# Patient Record
Sex: Female | Born: 1940 | Race: White | Hispanic: No | Smoking: Never smoker
Health system: Southern US, Community
[De-identification: ages and names within clinical notes are randomized; demographics above are authoritative.]

## PROBLEM LIST (undated history)

## (undated) DIAGNOSIS — C801 Malignant (primary) neoplasm, unspecified: Secondary | ICD-10-CM

## (undated) DIAGNOSIS — K219 Gastro-esophageal reflux disease without esophagitis: Secondary | ICD-10-CM

## (undated) DIAGNOSIS — E785 Hyperlipidemia, unspecified: Secondary | ICD-10-CM

## (undated) DIAGNOSIS — E039 Hypothyroidism, unspecified: Secondary | ICD-10-CM

## (undated) DIAGNOSIS — K5790 Diverticulosis of intestine, part unspecified, without perforation or abscess without bleeding: Secondary | ICD-10-CM

## (undated) DIAGNOSIS — IMO0002 Reserved for concepts with insufficient information to code with codable children: Secondary | ICD-10-CM

## (undated) DIAGNOSIS — I454 Nonspecific intraventricular block: Secondary | ICD-10-CM

## (undated) DIAGNOSIS — I10 Essential (primary) hypertension: Secondary | ICD-10-CM

## (undated) DIAGNOSIS — E079 Disorder of thyroid, unspecified: Secondary | ICD-10-CM

## (undated) DIAGNOSIS — M1990 Unspecified osteoarthritis, unspecified site: Secondary | ICD-10-CM

## (undated) HISTORY — DX: Nonspecific intraventricular block: I45.4

## (undated) HISTORY — DX: Unspecified osteoarthritis, unspecified site: M19.90

## (undated) HISTORY — DX: Disorder of thyroid, unspecified: E07.9

## (undated) HISTORY — DX: Diverticulosis of intestine, part unspecified, without perforation or abscess without bleeding: K57.90

## (undated) HISTORY — DX: Essential (primary) hypertension: I10

## (undated) HISTORY — DX: Gastro-esophageal reflux disease without esophagitis: K21.9

## (undated) HISTORY — DX: Reserved for concepts with insufficient information to code with codable children: IMO0002

## (undated) HISTORY — DX: Hyperlipidemia, unspecified: E78.5

---

## 1973-07-07 HISTORY — PX: ABDOMINAL HYSTERECTOMY: SHX81

## 1974-07-07 HISTORY — PX: OTHER SURGICAL HISTORY: SHX169

## 1996-07-07 HISTORY — PX: BREAST SURGERY: SHX581

## 1996-07-07 HISTORY — PX: MASTECTOMY: SHX3

## 1997-10-11 ENCOUNTER — Encounter: Admission: RE | Admit: 1997-10-11 | Discharge: 1998-01-09 | Payer: Self-pay | Admitting: *Deleted

## 1998-02-02 ENCOUNTER — Ambulatory Visit (HOSPITAL_COMMUNITY): Admission: RE | Admit: 1998-02-02 | Discharge: 1998-02-02 | Payer: Self-pay | Admitting: Hematology and Oncology

## 1998-04-27 ENCOUNTER — Ambulatory Visit (HOSPITAL_COMMUNITY): Admission: RE | Admit: 1998-04-27 | Discharge: 1998-04-27 | Payer: Self-pay | Admitting: General Surgery

## 1999-11-04 ENCOUNTER — Encounter: Payer: Self-pay | Admitting: Hematology and Oncology

## 1999-11-04 ENCOUNTER — Encounter: Admission: RE | Admit: 1999-11-04 | Discharge: 1999-11-04 | Payer: Self-pay | Admitting: Hematology and Oncology

## 2000-11-20 ENCOUNTER — Encounter: Payer: Self-pay | Admitting: Hematology and Oncology

## 2000-11-20 ENCOUNTER — Encounter: Admission: RE | Admit: 2000-11-20 | Discharge: 2000-11-20 | Payer: Self-pay | Admitting: Hematology and Oncology

## 2001-11-24 ENCOUNTER — Encounter: Payer: Self-pay | Admitting: Hematology and Oncology

## 2001-11-24 ENCOUNTER — Encounter: Admission: RE | Admit: 2001-11-24 | Discharge: 2001-11-24 | Payer: Self-pay | Admitting: Hematology and Oncology

## 2002-09-30 ENCOUNTER — Ambulatory Visit (HOSPITAL_COMMUNITY): Admission: RE | Admit: 2002-09-30 | Discharge: 2002-09-30 | Payer: Self-pay | Admitting: Gastroenterology

## 2002-12-16 ENCOUNTER — Ambulatory Visit (HOSPITAL_COMMUNITY): Admission: RE | Admit: 2002-12-16 | Discharge: 2002-12-16 | Payer: Self-pay | Admitting: Hematology and Oncology

## 2002-12-16 ENCOUNTER — Encounter: Payer: Self-pay | Admitting: Hematology and Oncology

## 2003-12-18 ENCOUNTER — Ambulatory Visit (HOSPITAL_COMMUNITY): Admission: RE | Admit: 2003-12-18 | Discharge: 2003-12-18 | Payer: Self-pay | Admitting: Oncology

## 2004-08-28 ENCOUNTER — Emergency Department (HOSPITAL_COMMUNITY): Admission: EM | Admit: 2004-08-28 | Discharge: 2004-08-29 | Payer: Self-pay | Admitting: Emergency Medicine

## 2005-01-03 ENCOUNTER — Ambulatory Visit (HOSPITAL_COMMUNITY): Admission: RE | Admit: 2005-01-03 | Discharge: 2005-01-03 | Payer: Self-pay | Admitting: Internal Medicine

## 2005-01-09 ENCOUNTER — Ambulatory Visit: Payer: Self-pay | Admitting: Oncology

## 2006-01-09 ENCOUNTER — Ambulatory Visit (HOSPITAL_COMMUNITY): Admission: RE | Admit: 2006-01-09 | Discharge: 2006-01-09 | Payer: Self-pay | Admitting: Oncology

## 2006-01-15 ENCOUNTER — Ambulatory Visit: Payer: Self-pay | Admitting: Oncology

## 2006-01-19 LAB — CBC WITH DIFFERENTIAL/PLATELET
BASO%: 0.2 % (ref 0.0–2.0)
Eosinophils Absolute: 0.1 10*3/uL (ref 0.0–0.5)
HCT: 37 % (ref 34.8–46.6)
HGB: 12.6 g/dL (ref 11.6–15.9)
MCHC: 34.1 g/dL (ref 32.0–36.0)
MONO#: 0.6 10*3/uL (ref 0.1–0.9)
NEUT#: 6.5 10*3/uL (ref 1.5–6.5)
NEUT%: 72.2 % (ref 39.6–76.8)
WBC: 9 10*3/uL (ref 3.9–10.0)
lymph#: 1.8 10*3/uL (ref 0.9–3.3)

## 2006-01-19 LAB — COMPREHENSIVE METABOLIC PANEL
AST: 17 U/L (ref 0–37)
Albumin: 4 g/dL (ref 3.5–5.2)
BUN: 14 mg/dL (ref 6–23)
Calcium: 9.6 mg/dL (ref 8.4–10.5)
Chloride: 108 mEq/L (ref 96–112)
Potassium: 3.6 mEq/L (ref 3.5–5.3)

## 2006-07-07 HISTORY — PX: JOINT REPLACEMENT: SHX530

## 2006-08-31 ENCOUNTER — Inpatient Hospital Stay (HOSPITAL_COMMUNITY): Admission: RE | Admit: 2006-08-31 | Discharge: 2006-09-03 | Payer: Self-pay | Admitting: Orthopedic Surgery

## 2007-01-14 ENCOUNTER — Ambulatory Visit (HOSPITAL_COMMUNITY): Admission: RE | Admit: 2007-01-14 | Discharge: 2007-01-14 | Payer: Self-pay | Admitting: Oncology

## 2008-01-31 ENCOUNTER — Ambulatory Visit (HOSPITAL_COMMUNITY): Admission: RE | Admit: 2008-01-31 | Discharge: 2008-01-31 | Payer: Self-pay | Admitting: Internal Medicine

## 2008-02-05 DIAGNOSIS — I454 Nonspecific intraventricular block: Secondary | ICD-10-CM

## 2008-02-05 HISTORY — DX: Nonspecific intraventricular block: I45.4

## 2009-02-28 ENCOUNTER — Ambulatory Visit (HOSPITAL_COMMUNITY): Admission: RE | Admit: 2009-02-28 | Discharge: 2009-02-28 | Payer: Self-pay | Admitting: Internal Medicine

## 2010-03-22 ENCOUNTER — Ambulatory Visit (HOSPITAL_COMMUNITY): Admission: RE | Admit: 2010-03-22 | Discharge: 2010-03-22 | Payer: Self-pay | Admitting: Neurology

## 2010-04-23 ENCOUNTER — Encounter: Admission: RE | Admit: 2010-04-23 | Discharge: 2010-04-23 | Payer: Self-pay | Admitting: Gastroenterology

## 2010-06-24 ENCOUNTER — Ambulatory Visit (HOSPITAL_COMMUNITY)
Admission: RE | Admit: 2010-06-24 | Discharge: 2010-06-24 | Payer: Self-pay | Source: Home / Self Care | Attending: Gastroenterology | Admitting: Gastroenterology

## 2010-07-27 ENCOUNTER — Encounter: Payer: Self-pay | Admitting: Gastroenterology

## 2010-09-16 LAB — GLUCOSE, CAPILLARY: Glucose-Capillary: 113 mg/dL — ABNORMAL HIGH (ref 70–99)

## 2010-09-29 ENCOUNTER — Emergency Department (HOSPITAL_COMMUNITY): Payer: Medicare PPO

## 2010-09-29 ENCOUNTER — Emergency Department (HOSPITAL_COMMUNITY)
Admission: EM | Admit: 2010-09-29 | Discharge: 2010-09-29 | Disposition: A | Discharge status: Home / Self Care | Payer: Medicare PPO | Attending: Emergency Medicine | Admitting: Emergency Medicine

## 2010-09-29 DIAGNOSIS — W108XXA Fall (on) (from) other stairs and steps, initial encounter: Secondary | ICD-10-CM | POA: Insufficient documentation

## 2010-09-29 DIAGNOSIS — Y92009 Unspecified place in unspecified non-institutional (private) residence as the place of occurrence of the external cause: Secondary | ICD-10-CM | POA: Insufficient documentation

## 2010-09-29 DIAGNOSIS — I10 Essential (primary) hypertension: Secondary | ICD-10-CM | POA: Insufficient documentation

## 2010-09-29 DIAGNOSIS — S42209A Unspecified fracture of upper end of unspecified humerus, initial encounter for closed fracture: Secondary | ICD-10-CM | POA: Insufficient documentation

## 2010-10-04 ENCOUNTER — Ambulatory Visit
Admission: RE | Admit: 2010-10-04 | Discharge: 2010-10-04 | Disposition: A | Discharge status: Home / Self Care | Payer: Medicare PPO | Source: Ambulatory Visit | Attending: Orthopedic Surgery | Admitting: Orthopedic Surgery

## 2010-10-04 ENCOUNTER — Other Ambulatory Visit: Payer: Self-pay | Admitting: Orthopedic Surgery

## 2010-10-04 DIAGNOSIS — S42309A Unspecified fracture of shaft of humerus, unspecified arm, initial encounter for closed fracture: Secondary | ICD-10-CM

## 2010-10-07 ENCOUNTER — Other Ambulatory Visit: Payer: Self-pay | Admitting: Orthopedic Surgery

## 2010-10-07 DIAGNOSIS — C50919 Malignant neoplasm of unspecified site of unspecified female breast: Secondary | ICD-10-CM

## 2010-10-09 ENCOUNTER — Other Ambulatory Visit: Payer: Medicare PPO

## 2010-11-22 NOTE — H&P (Signed)
NAME:  Erin [name], Erin [name] NO.:  [id number]   MEDICAL RECORD NO.:  [id number]         PATIENT TYPE:  LINP   LOCATION:                               FACILITY:  [facility]   PHYSICIAN:  Ollen Gross, M.D.    DATE OF BIRTH:  Nov 24, 1940   DATE OF ADMISSION:  08/31/2006  DATE OF DISCHARGE:                              HISTORY & PHYSICAL   DATE OF OFFICE VISIT/HISTORY AND PHYSICAL:  August 14, 2006   CHIEF COMPLAINT:  Right knee pain.   HISTORY OF PRESENT ILLNESS:  The patient is a 70 year old female who has  been seen by Dr. Lequita Halt for ongoing right knee pain.  She has  significant pain and locking with her knee that has dated back some 43  years ago, when she sustained a tibial plateau fracture, which was  treated with fixation and then subsequent removal of hardware.  Unfortunately, she has gone on to develop severe arthritis with the  right knee.  She has been treated conservatively in the past, also  including injections.  Despite conservative measures,  she still has  pain.  It is felt she has reached the point where she could benefit  undergoing surgical intervention.  Risks and benefits have been  discussed and the patient has elected to proceed with surgery.   ALLERGIES:  No known drug allergies.   INTOLERANCES:  CODEINE causes sickness.   CURRENT MEDICATIONS:  1. Hydrochlorothiazide 25 mg daily.  2. Azor 5/40 mg daily.  3. Prevacid 30 mg daily.  4. Aspirin daily.  5. Multivitamin daily.   PAST MEDICAL HISTORY:  1. Hypertension.  2. Reflux disease.  3. History of ulcers in the past, dating back to 2.  4. Postmenopausal.  5. History of breast cancer.   PAST SURGICAL HISTORY:  1. Stomach surgery secondary to ulceration.  2. Tibial plateau surgical fixation with subsequent removal of      hardware.  3. Partial hysterectomy.  4. Right mastectomy secondary to breast cancer.   FAMILY HISTORY:  Mother deceased, age 3, with history of brain tumor.  Father deceased, age 89, secondary to MVA.  Maternal grandmother  deceased at age 69.   SOCIAL HISTORY:  Married, works in a [employer] as a Engineer, manufacturing.  Nonsmoker, no alcohol.  Four children.  Husband and daughter will be  assisting with care after surgery.   REVIEW OF SYSTEMS:  GENERAL:  No fevers, chills, night sweats.  NEUROLOGIC:  No seizures, syncope, paralysis.  RESPIRATORY:  No  shortness of breath, productive cough, or hemoptysis.  CARDIOVASCULAR:  No chest pain, angina, or orthopnea.  GASTROINTESTINAL:  No nausea,  vomiting, diarrhea, or constipation.  GENITOURINARY:  No dysuria,  hematuria, or discharge.  MUSCULOSKELETAL:  Right knee.   PHYSICAL EXAMINATION:  VITAL SIGNS:  Pulse 88, respirations 14, blood  pressure 148/76.  GENERAL:  A 70 year old white female, well-nourished, well-developed, no  acute distress.  She is a very good historian.  She is alert, oriented,  cooperative, and pleasant.  HEENT:  Normocephalic, atraumatic.  Pupils round and reactive.  Oropharynx clear.  EOMs intact.  She  has upper full denture plate.  NECK:  Supple.  CHEST:  Clear.  No rhonchi, rales, or wheezing.  She has had a right  mastectomy (no blood pressure or IV sticks in the right arm).  HEART:  Regular rate and rhythm without murmur.  S1, S2 noted.  No rubs,  thrills, palpitations.  ABDOMEN:  Soft, nontender.  Bowel sounds present.  RECTAL/BREASTS/GENITALIA:  Not done, not pertinent to present illness.  EXTREMITIES:  Right knee, 20 degree valgus malalignment deformity.  Range of motion of 5-125.  No instability.   IMPRESSION:  1. Osteoarthritis, right knee.  2. Hypertension.  3. Reflux disease.  4. Past history of ulcers, 1975.  5. Postmenopausal.  6. History of breast cancer.   PLAN:  The patient admitted to [facility] to undergo a right  total knee arthroplasty.  Surgery will be performed by Dr. Ollen Gross.  Her medical physician, Dr. Felipa Eth, will be notified of  the room  number and admission and will be consulted if needed for medical  assistance with the patient throughout the hospital course.      Alexzandrew L. Julien Girt, P.A.      Ollen Gross, M.D.  Electronically Signed   ALP/MEDQ  D:  08/30/2006  T:  08/30/2006  Job:  [id number]   cc:   Larina Earthly, M.D.  Fax: [phone]   Ollen Gross, M.D.  Fax: [phone]

## 2010-11-22 NOTE — Op Note (Signed)
NAME:  Erin [name], Erin [name]                        ACCOUNT NO.:  [id number]   MEDICAL RECORD NO.:  [id number]                   PATIENT TYPE:  AMB   LOCATION:  ENDO                                 FACILITY:  [facility]   PHYSICIAN:  Petra Kuba, M.D.                 DATE OF BIRTH:  1940-08-06   DATE OF PROCEDURE:  09/30/2002  DATE OF DISCHARGE:                                 OPERATIVE REPORT   PROCEDURE:  Colonoscopy.   INDICATIONS FOR PROCEDURE:  A patient with multiple bouts of presumed  diverticulitis due for colonic screening.   Consent was signed after risks, benefits, methods, and options were  thoroughly discussed in the office.   MEDICINES USED:  Demerol 80, Versed 7.   DESCRIPTION OF PROCEDURE:  Rectal inspection was pertinent for external  hemorrhoids, small. Digital exam was negative. The pediatric video  adjustable colonoscope was inserted and with some difficulty due to a  tortuous edematous sigmoid once through that area I was able to easily  advance to the cecum. This did require some abdominal pressure but no  position changes. Other than left sided diverticula, no abnormalities were  seen. The cecum was identified by the appendiceal orifice and the ileocecal  valve. The prep on the right was pertinent for some stool adherent to the  wall which could not be washed off. The rest of the prep was adequate. Lots  of washing and suctioning was done for adequate visualization. On slow  withdrawal through the colon, the cecum and the ascending were normal. In  the distal transverse were scattered diverticula as was in the descending.  The sigmoid was rather tortuous and edematous, possibly some early  stricturing but no polypoid lesions or other abnormalities were seen. At one  point, we thought we saw a polyp but did push on it with the biopsy forceps  and it inverted and was a diverticula. No biopsies were obtained. Once back  in the rectum, the scope was retroflexed  pertinent for some internal  hemorrhoids. The scope was straightened and readvanced a very short ways up  the sigmoid, air was suctioned, scope removed. The patient tolerated the  procedure well. There was no obvious or immediate complications.   ENDOSCOPIC DIAGNOSIS:  1. Internal and external hemorrhoids.  2. Sigmoid edema, tortuosity and diverticula.  3. Scattered descending and distal transverse diverticula.  4. Otherwise within normal limits to the cecum.    PLAN:  Happy to see back p.r.n., otherwise, return care to Dr. Felipa Eth for the  customary health care maintenance to include yearly rectals and guaiacs and  repeat screening as needed in five years. Might consider a one time barium  enema or virtual colonoscopy if available at that juncture.  Petra Kuba, M.D.    MEM/MEDQ  D:  09/30/2002  T:  09/30/2002  Job:  [id number]   cc:   Larina Earthly, M.D.  [address]  [place]  [place] [address]  Fax: [phone]

## 2010-11-22 NOTE — Op Note (Signed)
NAME:  Erin [name], Erin [name] NO.:  [id number]   MEDICAL RECORD NO.:  [id number]          PATIENT TYPE:  INP   LOCATION:  1512                         FACILITY:  [facility]   PHYSICIAN:  Ollen Gross, M.D.    DATE OF BIRTH:  02/14/1941   DATE OF PROCEDURE:  08/31/2006  DATE OF DISCHARGE:                               OPERATIVE REPORT   PREOPERATIVE DIAGNOSIS:  Osteoarthritis, right knee.   POSTOPERATIVE DIAGNOSIS:  Osteoarthritis, right knee.   PROCEDURE:  Right total knee arthroplasty, computer navigated.   SURGEON:  Aluisio.   ASSISTANT:  Avel Peace, P.A.C.   ANESTHESIA:  Spinal.   ESTIMATED BLOOD LOSS:  Minimal.   DRAIN:  Hemovac x1.   TOURNIQUET TIME:  51 minutes at 300 mmHg.   COMPLICATIONS:  None.   CONDITION:  Stable to recovery.   BRIEF CLINICAL NOTE:  Ms. Erin [name] is a 70 year old female end-stage  osteoarthritis of the right knee, previous femur fracture treated with  intermedullary nailing.  She has deformity of the whole lower leg with  valgus at the knee.  She presents now for total knee arthroplasty and  under computer navigation given the prior femur fracture.   PROCEDURE IN DETAIL:  After successful administration of spinal  anesthetic a tourniquet is placed high on her right thigh and right  lower extremity prepped and draped in the usual sterile fashion.  Extremity is wrapped in Esmarch, knee flexed, tourniquet inflated to 300  mmHg.  A midline incision is made with a 10 blade through the  subcutaneous tissue to the level of the extensor mechanism.  A fresh  blade is used to make a medial parapatellar arthrotomy.  Minimal tissue  medially is subperiosteally elevated at the joint line with the knife,  then we also elevated laterally subperiosteally.  Patella subluxed  laterally, knee flexed 90 degrees, ACL and PCL removed.  The 4 mm Shanz  screws were then placed, 2 in the tibia and 2 in the femur, and the  computerized rays attached.   Anatomical data is entered into the  computer for generation of the femoral tibial models.  She had a 14  degree valgus deformity, 10 degree flexion contracture.   We then placed a distal femoral cutting block under computer guidance to  remove 10 mm of the distal femur in neutral varus valgus, neutral  flexion extension.  Resection is made with an oscillating saw.  A  verification device is placed on the cut bone surface to confirm that  the cut is made as planned.  The rotation is marked at the epicondylar  axis.  A size 3 is the most appropriate size for the femur.  This was  per the computer and also per the intraop measurement.  The AP cutting  block was then placed and anterior, posterior and Chamfer cuts made.  The rotation did correspond with the computer-generated rotation.   Tibia subluxed forward and menisci removed.  The tibial cutting guide  was placed under computer guidance and is placed for resection of about  6 mm off the more deficient lateral side with neutral  varus and valgus  and 2 degree posterior slope.  Resection is made with an oscillating saw  and the verification device confirms that the resection is made as  planned.  A 10 mm spacer block was then placed in flexion and extension,  shows excellent balance throughout.  In extension, she was a tiny bit  tight in valgus, so I did some soft tissue release to get her balanced  perfectly.  We then completed preparation of the tibial side with the  modular drill and keel punch and the femoral side with the intercondylar  cut.  Our tibia is a size 3 as is the femur.  A trial mobile bearing  tibial tray with the trial posterior stabilized femur and a 10 mm  posterior stabilized rotating platform inserter placed.  Our alignment  is 1 degree of valgus with no flexion contracture and at 90 degrees we  are balanced perfectly.  The patella was then everted, thickness  measured to be 21 mm.  Free hand resection is taken to 12  mm, a 35  template is placed, lug holes are drilled, a trial patella is placed and  it tracks normally.  Osteophytes are removed at the posterior femur with  the trial placed.  All trials are removed and the cut bone surfaces  repaired with pulsatile lavage.  Cement is mixed and once ready for  implantation the size 3 mobile bearing tibial tray, size 3 posterior  stabilized femur and 35 patella are cemented into place and the patella  is held with the clamp.  A trial 10 mm insert is placed, knee held in  full extension and all extruded cement removed.  Once the cement was  fully hardened, then the permanent 10 mm posterior stabilized rotating  platform insert is placed into the tibial tray.  The wound was copiously  irrigated with saline solution and the extensor mechanism closed over a  Hemovac drain with interrupted #1 PDS.  Flexion against gravity is about  125 degrees.  Tourniquet is released, total time of 51 minutes.  Please  note that the computer pins had been removed prior to cementing the  components in.  Subcu was then closed with interrupted #2-0 Vicryl,  subcuticular running #4-0 Monocryl.  Drain is hooked to suction.  Incision cleaned and dry and Steri-Strips and a bulky sterile dressing  applied.  She was then awakened and transported to Recovery in stable  condition.      Ollen Gross, M.D.  Electronically Signed     FA/MEDQ  D:  08/31/2006  T:  08/31/2006  Job:  [id number]

## 2010-11-22 NOTE — Discharge Summary (Signed)
Erin [name], NEU NO.:  [id number]   MEDICAL RECORD NO.:  [id number]          PATIENT TYPE:  INP   LOCATION:  1512                         FACILITY:  [facility]   PHYSICIAN:  Erin [name], M.D.    DATE OF BIRTH:  [birth year]   DATE OF ADMISSION:  08/31/2006  DATE OF DISCHARGE:  09/03/2006                               DISCHARGE SUMMARY   DATE OF OFFICE VISIT HISTORY AND PHYSICAL:  August 31, 2006.   ADMITTING DIAGNOSES:  1. Osteoarthritis right knee  2. Hypertension.  3. Reflux disease.  4. Past history of ulcers in 1975.  5. Postmenopausal.  6. History of breast cancer.   DISCHARGE DIAGNOSES:  1. Osteoarthritis right knee, status post right total knee      arthroplasty, computer-navigation assisted.  2. Acute blood loss anemia; did not require transfusion.  3. Hypertension.  4. Reflux disease.  5. Past history of ulcers in 1975.  6. Postmenopausal.  7. History of breast cancer.   PROCEDURE:  August 31, 2006, right total knee computer-assisted.  Surgeon:  Erin [name].  Assistant:  Erin Peace, PA-C.  Spinal  anesthesia.   CONSULTS:  None.   BRIEF HISTORY:  Erin [name] is a 70 year old female with end-stage  arthritis of the right knee and previous femur fracture treated with  intramedullary nailing.  She has a deformity of the whole lower leg with  valgus at the knee, now presents for total knee arthroplasty assisted by  computer navigation for the femur fracture.   LABORATORY DATA:  Preoperative CBC showed a hemoglobin of 13.1,  hematocrit of 38.1, white cell count of 6.3.  Postoperative hemoglobin  9.2, drifted down to 8.9.  Last noted H&H was 8.7 and 25.1.  PT/PTT on  admission 12.8 and 29 respectively.  INR 1.0.  Serial pro times  followed.  Last noted PT/INR 28.5 and 2.5.  Chem panel on admission all  within normal limits.  Serial BMETs were followed.  Electrolytes  remained within normal limits.  Preoperative UA negative.  Blood  group/type AB positive.   Chest x-ray April 10, 2006:  No evidence of acute process or metastatic  disease.   EKG April 10, 2006:  Sinus rhythm, unconfirmed.   HOSPITAL COURSE:  The patient was admitted to [facility]  tolerated procedure well, later transferred to the recovery room and  then the orthopedic floor.  Started on PCA and p.o. analgesics for pain  control following surgery.  Did have nausea through the night with PCA  which was stopped.  She was unable to tolerate the Percocet so she was  weaned over to Darvocet.  Hemovac drain came out through the night.  She  was seen on rounds by Erin [name] on the morning of day #1.  Started  getting up out of bed slowly with physical therapy.  Initial day or so  was slow because of the postoperative nausea.  We resumed her Prevacid  because of the reflux.  By day #2 she was doing a little bit better.  The leg was quite sore so we discontinued the CPM and  the PAS hose, but  the nausea had improved.  She started getting up with physical therapy,  just doing a little bit of short ambulation in the room.  Dressing was  changed, incision looked good.  By the following day she was actually  doing much better.  She did real well physical therapy, getting up and  ambulating approximately 125 feet.  The nausea was complete resolved.  She was seen in rounds by Erin [name] and asking to go home.  She did  get two therapy sessions in and was discharged at that time.   DISCHARGE PLAN:  1. The patient was discharged home on September 03, 2006.  2. Discharge diagnoses:  Please see above.  3. Discharge medications:  Nu-Iron Coumadin, Darvocet, Robaxin.  4. Activity:  Weightbearing as tolerated right lower extremity.  Home      health PT, home health nursing.  Total knee protocol.  5. Followup 2 weeks.   DISPOSITION:  Home.   CONDITION UPON DISCHARGE:  Improving.      Erin [name], P.A.      Erin [name], M.D.   Electronically Signed    ALP/MEDQ  D:  09/30/2006  T:  09/30/2006  Job:  [id number]   cc:   Erin [name], M.D.  Fax: [phone]

## 2011-04-09 ENCOUNTER — Other Ambulatory Visit (HOSPITAL_COMMUNITY): Payer: Self-pay | Admitting: Internal Medicine

## 2011-04-09 DIAGNOSIS — Z1231 Encounter for screening mammogram for malignant neoplasm of breast: Secondary | ICD-10-CM

## 2011-04-11 ENCOUNTER — Ambulatory Visit (HOSPITAL_COMMUNITY)
Admission: RE | Admit: 2011-04-11 | Discharge: 2011-04-11 | Disposition: A | Discharge status: Home / Self Care | Payer: Medicare PPO | Source: Ambulatory Visit | Attending: Internal Medicine | Admitting: Internal Medicine

## 2011-04-11 ENCOUNTER — Other Ambulatory Visit (HOSPITAL_COMMUNITY): Payer: Self-pay | Admitting: Internal Medicine

## 2011-04-11 DIAGNOSIS — Z1231 Encounter for screening mammogram for malignant neoplasm of breast: Secondary | ICD-10-CM

## 2011-04-16 ENCOUNTER — Other Ambulatory Visit: Payer: Self-pay | Admitting: Internal Medicine

## 2011-04-16 DIAGNOSIS — R928 Other abnormal and inconclusive findings on diagnostic imaging of breast: Secondary | ICD-10-CM

## 2011-04-29 ENCOUNTER — Ambulatory Visit
Admission: RE | Admit: 2011-04-29 | Discharge: 2011-04-29 | Disposition: A | Discharge status: Home / Self Care | Payer: Medicare PPO | Source: Ambulatory Visit | Attending: Internal Medicine | Admitting: Internal Medicine

## 2011-04-29 DIAGNOSIS — R928 Other abnormal and inconclusive findings on diagnostic imaging of breast: Secondary | ICD-10-CM

## 2011-05-13 ENCOUNTER — Other Ambulatory Visit (HOSPITAL_COMMUNITY): Payer: Self-pay | Admitting: Obstetrics and Gynecology

## 2011-05-13 DIAGNOSIS — N898 Other specified noninflammatory disorders of vagina: Secondary | ICD-10-CM

## 2011-05-15 ENCOUNTER — Inpatient Hospital Stay (HOSPITAL_COMMUNITY): Admission: RE | Admit: 2011-05-15 | Payer: Medicare PPO | Source: Ambulatory Visit

## 2011-05-22 ENCOUNTER — Other Ambulatory Visit (HOSPITAL_COMMUNITY): Payer: Self-pay | Admitting: Obstetrics and Gynecology

## 2011-05-22 DIAGNOSIS — N949 Unspecified condition associated with female genital organs and menstrual cycle: Secondary | ICD-10-CM

## 2011-05-22 DIAGNOSIS — N898 Other specified noninflammatory disorders of vagina: Secondary | ICD-10-CM

## 2011-05-26 ENCOUNTER — Encounter (HOSPITAL_COMMUNITY): Payer: Self-pay

## 2011-05-26 ENCOUNTER — Ambulatory Visit (HOSPITAL_COMMUNITY)
Admission: RE | Admit: 2011-05-26 | Discharge: 2011-05-26 | Disposition: A | Discharge status: Home / Self Care | Payer: Medicare PPO | Source: Ambulatory Visit | Attending: Obstetrics and Gynecology | Admitting: Obstetrics and Gynecology

## 2011-05-26 DIAGNOSIS — N898 Other specified noninflammatory disorders of vagina: Secondary | ICD-10-CM | POA: Insufficient documentation

## 2011-05-26 DIAGNOSIS — N289 Disorder of kidney and ureter, unspecified: Secondary | ICD-10-CM | POA: Insufficient documentation

## 2011-05-26 DIAGNOSIS — N949 Unspecified condition associated with female genital organs and menstrual cycle: Secondary | ICD-10-CM

## 2011-05-26 DIAGNOSIS — Z853 Personal history of malignant neoplasm of breast: Secondary | ICD-10-CM | POA: Insufficient documentation

## 2011-05-26 DIAGNOSIS — K838 Other specified diseases of biliary tract: Secondary | ICD-10-CM | POA: Insufficient documentation

## 2011-05-26 DIAGNOSIS — Z90710 Acquired absence of both cervix and uterus: Secondary | ICD-10-CM | POA: Insufficient documentation

## 2011-05-26 HISTORY — DX: Malignant (primary) neoplasm, unspecified: C80.1

## 2011-05-26 MED ORDER — IOHEXOL 300 MG/ML  SOLN
100.0000 mL | Freq: Once | INTRAMUSCULAR | Status: AC | PRN
  Administered 2011-05-26: 100 mL via INTRAVENOUS

## 2011-07-16 ENCOUNTER — Encounter (INDEPENDENT_AMBULATORY_CARE_PROVIDER_SITE_OTHER): Payer: Self-pay

## 2011-07-21 ENCOUNTER — Ambulatory Visit (INDEPENDENT_AMBULATORY_CARE_PROVIDER_SITE_OTHER): Payer: Medicare PPO | Admitting: General Surgery

## 2011-07-21 ENCOUNTER — Encounter (INDEPENDENT_AMBULATORY_CARE_PROVIDER_SITE_OTHER): Payer: Self-pay | Admitting: General Surgery

## 2011-07-21 VITALS — BP 156/92 | HR 80 | Temp 97.8°F | Resp 16 | Ht 62.0 in | Wt 147.6 lb

## 2011-07-21 DIAGNOSIS — N824 Other female intestinal-genital tract fistulae: Secondary | ICD-10-CM

## 2011-07-21 NOTE — Progress Notes (Signed)
Patient ID: Erin [name], female   DOB: [birth year], 71 y.o.   MRN: [id number]  Chief Complaint  Patient presents with  . Other    est pt new prob- eval fistula     HPI Erin [name] is a 71 y.o. female.   HPI  She is referred by Dr. 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 On March 06, 2011 she reports the onset of brown foul-smelling vaginal discharge. She was placed on antibiotics by Dr. Felipa Eth but the discharge persisted. She states she also passes air out of her vagina at times. In October a colonoscopy was performed by Dr. Ewing Schlein, I do not have the results of this yet. In November a CT scan of the abdomen and pelvis was performed which demonstrated findings consistent with a colovaginal fistula. She continues to have the brownish foul-smelling drainage and has to change the pads to 3 times a day. She is here to discuss further treatment of this.  Past Medical History  Diagnosis Date  . Bundle branch block   . Degenerative joint disease   . Diverticulosis   . GERD (gastroesophageal reflux disease)   . Hyperlipidemia   . Hypertension   . Thyroid disease     hypothyroidism  . Osteoarthritis   . Osteoporosis   . Ulcer     peptic  . Cancer     breast ca    Past Surgical History  Procedure Date  . Gastric ulcer repair   . Breast surgery     mastectomy right breast  . Abdominal hysterectomy   . Fracture surgery 2007    knee replacement    History reviewed. No pertinent family history.  Social History History  Substance Use Topics  . Smoking status: Never Smoker   . Smokeless tobacco: Not on file  . Alcohol Use: No    Allergies  Allergen Reactions  . Codeine   . Morphine And Related     Current Outpatient Prescriptions  Medication Sig Dispense Refill  . ALPRAZolam (NIRAVAM) 0.25 MG dissolvable tablet Take 0.25 mg by mouth at bedtime as needed.      Marland Kitchen amLODipine (NORVASC) 5 MG tablet Take 5 mg by mouth daily.      . Calcium-Vitamin D (CALTRATE 600  PLUS-VIT D PO) Take by mouth.      . cyclobenzaprine (FLEXERIL) 10 MG tablet Take 10 mg by mouth 3 (three) times daily as needed.      . fish oil-omega-3 fatty acids 1000 MG capsule Take 2 g by mouth daily.      . hydrochlorothiazide (HYDRODIURIL) 25 MG tablet Take 25 mg by mouth daily.      Marland Kitchen levothyroxine (SYNTHROID, LEVOTHROID) 50 MCG tablet Take 50 mcg by mouth daily.      Marland Kitchen losartan (COZAAR) 50 MG tablet Take 50 mg by mouth daily.      . Magnesium 250 MG TABS Take by mouth.      . Multiple Vitamin (MULTIVITAMIN) tablet Take 1 tablet by mouth daily.      Marland Kitchen omeprazole (PRILOSEC) 40 MG capsule Take 40 mg by mouth daily.        Review of Systems Review of Systems  Constitutional: Negative.   HENT: Negative.   Respiratory: Negative.   Cardiovascular: Negative.   Gastrointestinal:       Reflux  Genitourinary: Positive for vaginal discharge. Negative for dysuria, hematuria and difficulty urinating.  Neurological: Negative.   Hematological: Negative.     Blood pressure 156/92, pulse  80, temperature 97.8 F (36.6 C), temperature source Temporal, resp. rate 16, height 5\' 2"  (1.575 m), weight 147 lb 9.6 oz (66.951 kg).  Physical Exam Physical Exam  Constitutional: She appears well-developed and well-nourished. No distress.  HENT:  Head: Normocephalic and atraumatic.  Eyes: Conjunctivae and EOM are normal.  Neck: Neck supple.  Cardiovascular: Normal rate and regular rhythm.   No murmur heard. Pulmonary/Chest: Effort normal and breath sounds normal.  Abdominal: Soft. She exhibits no distension and no mass. There is no tenderness.       Upper midline scar.  Genitourinary:       Yellowish vaginal discharge.  Musculoskeletal: Normal range of motion. She exhibits no edema.  Lymphadenopathy:    She has no cervical adenopathy.  Neurological: She is alert.  Skin: Skin is warm and dry.  Psychiatric: She has a normal mood and affect. Her behavior is normal.    Data Reviewed Dr.  Eligha Bridegroom note.  CT scan.  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 Clinically there does not appear to be bladder involvement although the bladder appears to be close to this process on CT scan.    Plan    Laparoscopic-assisted possible open partial colectomy with repair of colovaginal fistula and left ureteral stent placement by urology. I've explained the procedure risks and aftercare extensively with her and her daughter.    Risks include but are not limited to bleeding, infection, wound problems, anesthesia, anastomotic leak, need for colostomy, injury to intraabominal organs (such as intestine, spleen, kidney, bladder, ureter, etc.), ileus, irregular bowel habits.  She seems to understand and agrees to proceed.       Erin [name] J 07/21/2011, 5:11 PM

## 2011-07-23 ENCOUNTER — Other Ambulatory Visit: Payer: Self-pay | Admitting: Urology

## 2011-08-01 ENCOUNTER — Encounter (HOSPITAL_COMMUNITY): Payer: Self-pay

## 2011-08-08 ENCOUNTER — Other Ambulatory Visit (INDEPENDENT_AMBULATORY_CARE_PROVIDER_SITE_OTHER): Payer: Self-pay | Admitting: General Surgery

## 2011-08-11 ENCOUNTER — Encounter (HOSPITAL_COMMUNITY)
Admission: RE | Admit: 2011-08-11 | Discharge: 2011-08-11 | Disposition: A | Discharge status: Home / Self Care | Payer: Medicare HMO | Source: Ambulatory Visit | Attending: General Surgery | Admitting: General Surgery

## 2011-08-11 ENCOUNTER — Encounter (HOSPITAL_COMMUNITY): Payer: Self-pay

## 2011-08-11 ENCOUNTER — Ambulatory Visit (HOSPITAL_COMMUNITY)
Admission: RE | Admit: 2011-08-11 | Discharge: 2011-08-11 | Disposition: A | Discharge status: Home / Self Care | Payer: Medicare HMO | Source: Ambulatory Visit | Attending: General Surgery | Admitting: General Surgery

## 2011-08-11 ENCOUNTER — Telehealth (INDEPENDENT_AMBULATORY_CARE_PROVIDER_SITE_OTHER): Payer: Self-pay

## 2011-08-11 DIAGNOSIS — Z01818 Encounter for other preprocedural examination: Secondary | ICD-10-CM | POA: Insufficient documentation

## 2011-08-11 DIAGNOSIS — Z01812 Encounter for preprocedural laboratory examination: Secondary | ICD-10-CM | POA: Insufficient documentation

## 2011-08-11 DIAGNOSIS — I10 Essential (primary) hypertension: Secondary | ICD-10-CM | POA: Insufficient documentation

## 2011-08-11 DIAGNOSIS — Z9010 Acquired absence of unspecified breast and nipple: Secondary | ICD-10-CM | POA: Insufficient documentation

## 2011-08-11 DIAGNOSIS — Z853 Personal history of malignant neoplasm of breast: Secondary | ICD-10-CM | POA: Insufficient documentation

## 2011-08-11 HISTORY — DX: Hypothyroidism, unspecified: E03.9

## 2011-08-11 LAB — SURGICAL PCR SCREEN
MRSA, PCR: NEGATIVE
Staphylococcus aureus: POSITIVE — AB

## 2011-08-11 LAB — COMPREHENSIVE METABOLIC PANEL
Alkaline Phosphatase: 100 U/L (ref 39–117)
BUN: 22 mg/dL (ref 6–23)
Chloride: 105 mEq/L (ref 96–112)
Creatinine, Ser: 0.99 mg/dL (ref 0.50–1.10)
GFR calc Af Amer: 65 mL/min — ABNORMAL LOW (ref >90)
Glucose, Bld: 110 mg/dL — ABNORMAL HIGH (ref 70–99)
Potassium: 3.9 mEq/L (ref 3.5–5.1)
Total Bilirubin: 0.2 mg/dL — ABNORMAL LOW (ref 0.3–1.2)
Total Protein: 7.2 g/dL (ref 6.0–8.3)

## 2011-08-11 LAB — DIFFERENTIAL
Basophils Absolute: 0 10*3/uL (ref 0.0–0.1)
Basophils Relative: 0 % (ref 0–1)
Eosinophils Absolute: 0.1 10*3/uL (ref 0.0–0.7)
Neutro Abs: 9.6 10*3/uL — ABNORMAL HIGH (ref 1.7–7.7)
Neutrophils Relative %: 79 % — ABNORMAL HIGH (ref 43–77)

## 2011-08-11 LAB — CBC
HCT: 39.8 % (ref 36.0–46.0)
Hemoglobin: 12.9 g/dL (ref 12.0–15.0)
MCHC: 32.4 g/dL (ref 30.0–36.0)
MCV: 94.5 fL (ref 78.0–100.0)

## 2011-08-11 LAB — PROTIME-INR
INR: 0.94 (ref 0.00–1.49)
Prothrombin Time: 12.8 seconds (ref 11.6–15.2)

## 2011-08-11 NOTE — Telephone Encounter (Signed)
[facility] pre op calling to report abnormal labs on the patient.  Platelet count of 118,000. Pt is scheduled for surgery on 08/15/11.

## 2011-08-11 NOTE — Pre-Procedure Instructions (Signed)
BERNIE MORRIS AT DR. Maris Berger OFFICE NOTIFIED BY PHONE OF PT'S LOW PLATELET COUNT 118,000.

## 2011-08-11 NOTE — Pre-Procedure Instructions (Signed)
EKG REPORT ON CHART FROM DR. Vicente Males OFFICE - DONE 04/14/11. CHEST XRAY WILL BE DONE TODAY - PREOP - AT [facility].

## 2011-08-11 NOTE — Patient Instructions (Signed)
20 Erin [name]  08/11/2011   Your procedure is scheduled on:  Friday 08/15/11  AT  7:30 AM  Report to [facility] at 5:30 AM.  Call this number if you have problems the morning of surgery: [phone]   Remember:   Do not eat food OR DRINK ANYTHING AFTER MIDNIGHT THE NIGHT BEFORE YOUR SURGERY.    Take these medicines the morning of surgery with A SIP OF WATER: AMLODIPINE, LEVOTHYROXINE, OMEPRAZOLE   Do not wear jewelry, make-up or nail polish.  Do not wear lotions, powders, or perfumes.   Do not shave 48 hours prior to surgery.  Do not bring valuables to the hospital.  Contacts, dentures or bridgework may not be worn into surgery.  Leave suitcase in the car. After surgery it may be brought to your room.  For patients admitted to the hospital, checkout time is 11:00 AM the day of discharge.   Patients discharged the day of surgery will not be allowed to drive home.    Special Instructions: CHG Shower Use Special Wash: 1/2 bottle night before surgery and 1/2 bottle morning of surgery.   Please read over the following fact sheets that you were given: Blood Transfusion Information and MRSA Information

## 2011-08-13 ENCOUNTER — Encounter (INDEPENDENT_AMBULATORY_CARE_PROVIDER_SITE_OTHER): Payer: Self-pay

## 2011-08-14 ENCOUNTER — Encounter (INDEPENDENT_AMBULATORY_CARE_PROVIDER_SITE_OTHER): Payer: Self-pay

## 2011-08-15 ENCOUNTER — Encounter (HOSPITAL_COMMUNITY)
Admission: RE | Disposition: A | Discharge status: Home / Self Care | Payer: Self-pay | Source: Ambulatory Visit | Attending: General Surgery

## 2011-08-15 ENCOUNTER — Encounter (HOSPITAL_COMMUNITY): Payer: Self-pay | Admitting: *Deleted

## 2011-08-15 ENCOUNTER — Other Ambulatory Visit (INDEPENDENT_AMBULATORY_CARE_PROVIDER_SITE_OTHER): Payer: Self-pay | Admitting: General Surgery

## 2011-08-15 ENCOUNTER — Inpatient Hospital Stay (HOSPITAL_COMMUNITY): Payer: Medicare HMO

## 2011-08-15 ENCOUNTER — Inpatient Hospital Stay (HOSPITAL_COMMUNITY): Payer: Medicare HMO | Admitting: *Deleted

## 2011-08-15 ENCOUNTER — Inpatient Hospital Stay (HOSPITAL_COMMUNITY)
Admission: RE | Admit: 2011-08-15 | Discharge: 2011-08-19 | DRG: 330 | Disposition: A | Discharge status: Home / Self Care | Payer: Medicare HMO | Source: Ambulatory Visit | Attending: General Surgery | Admitting: General Surgery

## 2011-08-15 DIAGNOSIS — K5732 Diverticulitis of large intestine without perforation or abscess without bleeding: Secondary | ICD-10-CM

## 2011-08-15 DIAGNOSIS — Z79899 Other long term (current) drug therapy: Secondary | ICD-10-CM

## 2011-08-15 DIAGNOSIS — Z8711 Personal history of peptic ulcer disease: Secondary | ICD-10-CM

## 2011-08-15 DIAGNOSIS — M1990 Unspecified osteoarthritis, unspecified site: Secondary | ICD-10-CM | POA: Diagnosis present

## 2011-08-15 DIAGNOSIS — K5730 Diverticulosis of large intestine without perforation or abscess without bleeding: Secondary | ICD-10-CM | POA: Diagnosis present

## 2011-08-15 DIAGNOSIS — K660 Peritoneal adhesions (postprocedural) (postinfection): Secondary | ICD-10-CM

## 2011-08-15 DIAGNOSIS — N736 Female pelvic peritoneal adhesions (postinfective): Secondary | ICD-10-CM | POA: Diagnosis present

## 2011-08-15 DIAGNOSIS — N824 Other female intestinal-genital tract fistulae: Secondary | ICD-10-CM

## 2011-08-15 DIAGNOSIS — D696 Thrombocytopenia, unspecified: Secondary | ICD-10-CM | POA: Diagnosis present

## 2011-08-15 DIAGNOSIS — K631 Perforation of intestine (nontraumatic): Secondary | ICD-10-CM

## 2011-08-15 DIAGNOSIS — K219 Gastro-esophageal reflux disease without esophagitis: Secondary | ICD-10-CM | POA: Diagnosis present

## 2011-08-15 DIAGNOSIS — Z96659 Presence of unspecified artificial knee joint: Secondary | ICD-10-CM

## 2011-08-15 DIAGNOSIS — E039 Hypothyroidism, unspecified: Secondary | ICD-10-CM | POA: Diagnosis present

## 2011-08-15 DIAGNOSIS — Z9079 Acquired absence of other genital organ(s): Secondary | ICD-10-CM

## 2011-08-15 DIAGNOSIS — I10 Essential (primary) hypertension: Secondary | ICD-10-CM | POA: Diagnosis present

## 2011-08-15 DIAGNOSIS — E785 Hyperlipidemia, unspecified: Secondary | ICD-10-CM | POA: Diagnosis present

## 2011-08-15 DIAGNOSIS — M810 Age-related osteoporosis without current pathological fracture: Secondary | ICD-10-CM | POA: Diagnosis present

## 2011-08-15 DIAGNOSIS — I454 Nonspecific intraventricular block: Secondary | ICD-10-CM | POA: Diagnosis present

## 2011-08-15 HISTORY — PX: CYSTOSCOPY W/ RETROGRADES: SHX1426

## 2011-08-15 HISTORY — PX: APPENDECTOMY: SHX54

## 2011-08-15 HISTORY — PX: COLON RESECTION: SHX5231

## 2011-08-15 HISTORY — PX: SALPINGOOPHORECTOMY: SHX82

## 2011-08-15 LAB — CBC
HCT: 32.7 % — ABNORMAL LOW (ref 36.0–46.0)
Hemoglobin: 11.1 g/dL — ABNORMAL LOW (ref 12.0–15.0)
MCH: 30.9 pg (ref 26.0–34.0)
MCHC: 33.9 g/dL (ref 30.0–36.0)
MCV: 91.1 fL (ref 78.0–100.0)
RDW: 12.6 % (ref 11.5–15.5)

## 2011-08-15 LAB — CREATININE, SERUM
Creatinine, Ser: 1.26 mg/dL — ABNORMAL HIGH (ref 0.50–1.10)
GFR calc non Af Amer: 42 mL/min — ABNORMAL LOW (ref >90)

## 2011-08-15 LAB — TYPE AND SCREEN: ABO/RH(D): AB POS

## 2011-08-15 SURGERY — COLECTOMY, HAND-ASSISTED, LAPAROSCOPIC
Anesthesia: General | Wound class: Clean Contaminated

## 2011-08-15 MED ORDER — DIPHENHYDRAMINE HCL 12.5 MG/5ML PO ELIX
12.5000 mg | ORAL_SOLUTION | Freq: Four times a day (QID) | ORAL | Status: DC | PRN
  Filled 2011-08-15: qty 5

## 2011-08-15 MED ORDER — IOHEXOL 300 MG/ML  SOLN
INTRAMUSCULAR | Status: DC | PRN
  Administered 2011-08-15: 10 mL

## 2011-08-15 MED ORDER — METOCLOPRAMIDE HCL 5 MG/ML IJ SOLN
INTRAMUSCULAR | Status: DC | PRN
  Administered 2011-08-15: 10 mg via INTRAVENOUS

## 2011-08-15 MED ORDER — GLYCOPYRROLATE 0.2 MG/ML IJ SOLN
INTRAMUSCULAR | Status: DC | PRN
  Administered 2011-08-15: .6 mg via INTRAVENOUS

## 2011-08-15 MED ORDER — LOSARTAN POTASSIUM 50 MG PO TABS
50.0000 mg | ORAL_TABLET | Freq: Two times a day (BID) | ORAL | Status: DC
  Administered 2011-08-15 – 2011-08-18 (×7): 50 mg via ORAL
  Filled 2011-08-15 (×9): qty 1

## 2011-08-15 MED ORDER — ONDANSETRON HCL 4 MG PO TABS: 4.0000 mg | ORAL_TABLET | Freq: Four times a day (QID) | ORAL | Status: DC | PRN

## 2011-08-15 MED ORDER — PHENYLEPHRINE HCL 10 MG/ML IJ SOLN
10.0000 mg | INTRAVENOUS | Status: DC | PRN
  Administered 2011-08-15: 50 ug/min via INTRAVENOUS

## 2011-08-15 MED ORDER — ONDANSETRON HCL 4 MG/2ML IJ SOLN
INTRAMUSCULAR | Status: DC | PRN
  Administered 2011-08-15: 4 mg via INTRAVENOUS

## 2011-08-15 MED ORDER — SODIUM CHLORIDE 0.9 % IJ SOLN: 9.0000 mL | INTRAMUSCULAR | Status: DC | PRN

## 2011-08-15 MED ORDER — CISATRACURIUM BESYLATE 2 MG/ML IV SOLN
INTRAVENOUS | Status: DC | PRN
  Administered 2011-08-15: 2 mg via INTRAVENOUS
  Administered 2011-08-15: 6 mg via INTRAVENOUS
  Administered 2011-08-15: 4 mg via INTRAVENOUS

## 2011-08-15 MED ORDER — DIPHENHYDRAMINE HCL 50 MG/ML IJ SOLN: 12.5000 mg | Freq: Four times a day (QID) | INTRAMUSCULAR | Status: DC | PRN

## 2011-08-15 MED ORDER — KCL IN DEXTROSE-NACL 20-5-0.9 MEQ/L-%-% IV SOLN
INTRAVENOUS | Status: DC
  Administered 2011-08-15 (×2): via INTRAVENOUS
  Administered 2011-08-16 (×2): 125 mL/h via INTRAVENOUS
  Administered 2011-08-17 (×2): via INTRAVENOUS
  Administered 2011-08-17: 75 mL/h via INTRAVENOUS
  Administered 2011-08-18 – 2011-08-19 (×2): via INTRAVENOUS
  Filled 2011-08-15 (×13): qty 1000

## 2011-08-15 MED ORDER — ACETAMINOPHEN 10 MG/ML IV SOLN
INTRAVENOUS | Status: DC | PRN
  Administered 2011-08-15: 1000 mg via INTRAVENOUS

## 2011-08-15 MED ORDER — KETOROLAC TROMETHAMINE 30 MG/ML IJ SOLN: 15.0000 mg | Freq: Once | INTRAMUSCULAR | Status: DC | PRN

## 2011-08-15 MED ORDER — 0.9 % SODIUM CHLORIDE (POUR BTL) OPTIME
TOPICAL | Status: DC | PRN
  Administered 2011-08-15: 1000 mL

## 2011-08-15 MED ORDER — STERILE WATER FOR IRRIGATION IR SOLN
Status: DC | PRN
  Administered 2011-08-15: 3000 mL via INTRAVESICAL

## 2011-08-15 MED ORDER — NEOSTIGMINE METHYLSULFATE 1 MG/ML IJ SOLN
INTRAMUSCULAR | Status: DC | PRN
  Administered 2011-08-15: 4 mg via INTRAVENOUS

## 2011-08-15 MED ORDER — PROMETHAZINE HCL 25 MG/ML IJ SOLN
12.5000 mg | Freq: Four times a day (QID) | INTRAMUSCULAR | Status: DC | PRN
  Administered 2011-08-15 – 2011-08-18 (×2): 12.5 mg via INTRAVENOUS
  Filled 2011-08-15 (×2): qty 1

## 2011-08-15 MED ORDER — MUPIROCIN 2 % EX OINT
1.0000 "application " | TOPICAL_OINTMENT | Freq: Two times a day (BID) | CUTANEOUS | Status: DC
  Administered 2011-08-15 – 2011-08-18 (×7): 1 via NASAL
  Filled 2011-08-15: qty 22

## 2011-08-15 MED ORDER — HYDROMORPHONE 0.3 MG/ML IV SOLN
INTRAVENOUS | Status: DC
Start: 2011-08-15 — End: 2011-08-18
  Administered 2011-08-15: 0.3 mg via INTRAVENOUS
  Administered 2011-08-15: 1.23 mg via INTRAVENOUS
  Administered 2011-08-15: 12:00:00 via INTRAVENOUS
  Administered 2011-08-15 – 2011-08-16 (×2): 0.6 mg via INTRAVENOUS
  Administered 2011-08-16: 0.472 mg via INTRAVENOUS
  Administered 2011-08-16: 0.6 mg via INTRAVENOUS
  Administered 2011-08-16: 1.2 mg via INTRAVENOUS
  Administered 2011-08-16: 16:00:00 via INTRAVENOUS
  Administered 2011-08-16: 1.2 mg via INTRAVENOUS
  Administered 2011-08-16 – 2011-08-17 (×2): 0.3 mg via INTRAVENOUS
  Administered 2011-08-17: 0.6 mg via INTRAVENOUS
  Administered 2011-08-17: 1.2 mg via INTRAVENOUS
  Administered 2011-08-17: 0.6 mg via INTRAVENOUS
  Administered 2011-08-18 (×2): 0.3 mg via INTRAVENOUS

## 2011-08-15 MED ORDER — LEVOTHYROXINE SODIUM 50 MCG PO TABS
50.0000 ug | ORAL_TABLET | Freq: Every day | ORAL | Status: DC
  Administered 2011-08-16 – 2011-08-19 (×4): 50 ug via ORAL
  Filled 2011-08-15 (×4): qty 1

## 2011-08-15 MED ORDER — PROPOFOL 10 MG/ML IV BOLUS
INTRAVENOUS | Status: DC | PRN
  Administered 2011-08-15: 160 mg via INTRAVENOUS

## 2011-08-15 MED ORDER — ONDANSETRON HCL 4 MG/2ML IJ SOLN
4.0000 mg | Freq: Four times a day (QID) | INTRAMUSCULAR | Status: DC | PRN
  Filled 2011-08-15: qty 2

## 2011-08-15 MED ORDER — EPHEDRINE SULFATE 50 MG/ML IJ SOLN
INTRAMUSCULAR | Status: DC | PRN
  Administered 2011-08-15: 7.5 mg via INTRAVENOUS
  Administered 2011-08-15: 5 mg via INTRAVENOUS

## 2011-08-15 MED ORDER — HEPARIN SODIUM (PORCINE) 5000 UNIT/ML IJ SOLN
5000.0000 [IU] | Freq: Three times a day (TID) | INTRAMUSCULAR | Status: DC
  Administered 2011-08-16 – 2011-08-19 (×9): 5000 [IU] via SUBCUTANEOUS
  Filled 2011-08-15 (×12): qty 1

## 2011-08-15 MED ORDER — DEXTROSE 5 % IV SOLN
1.0000 g | INTRAVENOUS | Status: AC
  Administered 2011-08-15: 1 g via INTRAVENOUS

## 2011-08-15 MED ORDER — FENTANYL CITRATE 0.05 MG/ML IJ SOLN
INTRAMUSCULAR | Status: DC | PRN
  Administered 2011-08-15: 100 ug via INTRAVENOUS
  Administered 2011-08-15: 50 ug via INTRAVENOUS

## 2011-08-15 MED ORDER — HYDROMORPHONE HCL PF 1 MG/ML IJ SOLN
INTRAMUSCULAR | Status: DC | PRN
  Administered 2011-08-15: 0.5 mg via INTRAVENOUS

## 2011-08-15 MED ORDER — DEXTROSE 5 % IV SOLN
2.0000 g | Freq: Four times a day (QID) | INTRAVENOUS | Status: DC
  Administered 2011-08-15 – 2011-08-19 (×16): 2 g via INTRAVENOUS
  Filled 2011-08-15 (×21): qty 2

## 2011-08-15 MED ORDER — DEXAMETHASONE SODIUM PHOSPHATE 10 MG/ML IJ SOLN
INTRAMUSCULAR | Status: DC | PRN
  Administered 2011-08-15: 5 mg via INTRAVENOUS

## 2011-08-15 MED ORDER — ALVIMOPAN 12 MG PO CAPS
12.0000 mg | ORAL_CAPSULE | Freq: Two times a day (BID) | ORAL | Status: DC
  Administered 2011-08-16 – 2011-08-18 (×6): 12 mg via ORAL
  Filled 2011-08-15 (×8): qty 1

## 2011-08-15 MED ORDER — MIDAZOLAM HCL 5 MG/5ML IJ SOLN
INTRAMUSCULAR | Status: DC | PRN
  Administered 2011-08-15: 1 mg via INTRAVENOUS

## 2011-08-15 MED ORDER — ONDANSETRON HCL 4 MG/2ML IJ SOLN
4.0000 mg | Freq: Four times a day (QID) | INTRAMUSCULAR | Status: DC | PRN
  Administered 2011-08-15 – 2011-08-19 (×6): 4 mg via INTRAVENOUS
  Filled 2011-08-15 (×5): qty 2

## 2011-08-15 MED ORDER — PHENYLEPHRINE HCL 10 MG/ML IJ SOLN
INTRAMUSCULAR | Status: DC | PRN
  Administered 2011-08-15: 40 ug via INTRAVENOUS

## 2011-08-15 MED ORDER — LIDOCAINE HCL (CARDIAC) 20 MG/ML IV SOLN
INTRAVENOUS | Status: DC | PRN
  Administered 2011-08-15: 50 mg via INTRAVENOUS

## 2011-08-15 MED ORDER — VANCOMYCIN HCL IN DEXTROSE 1-5 GM/200ML-% IV SOLN
1000.0000 mg | Freq: Once | INTRAVENOUS | Status: AC
  Administered 2011-08-15: 1000 mg via INTRAVENOUS

## 2011-08-15 MED ORDER — LACTATED RINGERS IV SOLN
INTRAVENOUS | Status: DC | PRN
  Administered 2011-08-15 (×3): via INTRAVENOUS

## 2011-08-15 MED ORDER — HYDROMORPHONE HCL PF 1 MG/ML IJ SOLN
0.2500 mg | INTRAMUSCULAR | Status: DC | PRN
  Administered 2011-08-15 (×2): 0.5 mg via INTRAVENOUS

## 2011-08-15 MED ORDER — PANTOPRAZOLE SODIUM 40 MG PO TBEC
40.0000 mg | DELAYED_RELEASE_TABLET | Freq: Every day | ORAL | Status: DC
  Administered 2011-08-16 – 2011-08-18 (×3): 40 mg via ORAL
  Filled 2011-08-15 (×4): qty 1

## 2011-08-15 MED ORDER — PROMETHAZINE HCL 25 MG/ML IJ SOLN: 6.2500 mg | INTRAMUSCULAR | Status: DC | PRN

## 2011-08-15 MED ORDER — SUCCINYLCHOLINE CHLORIDE 20 MG/ML IJ SOLN
INTRAMUSCULAR | Status: DC | PRN
  Administered 2011-08-15: 100 mg via INTRAVENOUS

## 2011-08-15 MED ORDER — NALOXONE HCL 0.4 MG/ML IJ SOLN: 0.4000 mg | INTRAMUSCULAR | Status: DC | PRN

## 2011-08-15 MED ORDER — LACTATED RINGERS IV SOLN: INTRAVENOUS | Status: DC

## 2011-08-15 MED ORDER — ROCURONIUM BROMIDE 100 MG/10ML IV SOLN
INTRAVENOUS | Status: DC | PRN
  Administered 2011-08-15: 5 mg via INTRAVENOUS

## 2011-08-15 MED ORDER — CHLORHEXIDINE GLUCONATE CLOTH 2 % EX PADS
6.0000 | MEDICATED_PAD | Freq: Every day | CUTANEOUS | Status: DC
  Administered 2011-08-16 – 2011-08-18 (×3): 6 via TOPICAL

## 2011-08-15 SURGICAL SUPPLY — 84 items
ADAPTER CATH URET PLST 4-6FR (CATHETERS) ×3 IMPLANT
ADAPTER GOLDBERG URETERAL (ADAPTER) ×3 IMPLANT
APPLIER CLIP 5 13 M/L LIGAMAX5 (MISCELLANEOUS)
APPLIER CLIP ROT 10 11.4 M/L (STAPLE)
APR CLP MED LRG 11.4X10 (STAPLE)
BAG URINE DRAINAGE (UROLOGICAL SUPPLIES) ×3 IMPLANT
BLADE EXTENDED COATED 6.5IN (ELECTRODE) ×3 IMPLANT
BLADE HEX COATED 2.75 (ELECTRODE) ×3 IMPLANT
BLADE SURG SZ10 CARB STEEL (BLADE) IMPLANT
CABLE HIGH FREQUENCY MONO STRZ (ELECTRODE) ×3 IMPLANT
CANISTER SUCTION 2500CC (MISCELLANEOUS) ×3 IMPLANT
CANNULA ENDOPATH XCEL 11M (ENDOMECHANICALS) IMPLANT
CATH FOLEY 2WAY SLVR  5CC 16FR (CATHETERS) ×1
CATH FOLEY 2WAY SLVR 5CC 16FR (CATHETERS) ×2 IMPLANT
CATH URET 5FR 28IN OPEN ENDED (CATHETERS) ×3 IMPLANT
CELLS DAT CNTRL 66122 CELL SVR (MISCELLANEOUS) IMPLANT
CLIP APPLIE 5 13 M/L LIGAMAX5 (MISCELLANEOUS) IMPLANT
CLIP APPLIE ROT 10 11.4 M/L (STAPLE) IMPLANT
CLOTH BEACON ORANGE TIMEOUT ST (SAFETY) ×3 IMPLANT
COVER MAYO STAND STRL (DRAPES) ×3 IMPLANT
DECANTER SPIKE VIAL GLASS SM (MISCELLANEOUS) ×3 IMPLANT
DISSECTOR BLUNT TIP ENDO 5MM (MISCELLANEOUS) IMPLANT
DRAIN CHANNEL RND F F (WOUND CARE) ×3 IMPLANT
DRAPE CAMERA CLOSED 9X96 (DRAPES) ×3 IMPLANT
DRAPE LAPAROSCOPIC ABDOMINAL (DRAPES) ×3 IMPLANT
DRAPE LG THREE QUARTER DISP (DRAPES) ×3 IMPLANT
DRAPE UTILITY XL STRL (DRAPES) ×3 IMPLANT
DRAPE WARM FLUID 44X44 (DRAPE) ×3 IMPLANT
ELECT REM PT RETURN 9FT ADLT (ELECTROSURGICAL) ×3
ELECTRODE REM PT RTRN 9FT ADLT (ELECTROSURGICAL) ×2 IMPLANT
EVACUATOR DRAINAGE 10X20 100CC (DRAIN) ×2 IMPLANT
EVACUATOR SILICONE 100CC (DRAIN) ×1
FILTER SMOKE EVAC LAPAROSHD (FILTER) IMPLANT
GLOVE BIOGEL PI IND STRL 7.0 (GLOVE) ×2 IMPLANT
GLOVE BIOGEL PI INDICATOR 7.0 (GLOVE) ×1
GLOVE ECLIPSE 8.0 STRL XLNG CF (GLOVE) ×6 IMPLANT
GLOVE INDICATOR 8.0 STRL GRN (GLOVE) ×6 IMPLANT
GOWN STRL NON-REIN LRG LVL3 (GOWN DISPOSABLE) ×6 IMPLANT
GOWN STRL REIN XL XLG (GOWN DISPOSABLE) ×9 IMPLANT
GUIDEWIRE STR DUAL SENSOR (WIRE) ×3 IMPLANT
KIT BASIN OR (CUSTOM PROCEDURE TRAY) ×3 IMPLANT
LEGGING LITHOTOMY PAIR STRL (DRAPES) ×3 IMPLANT
LIGASURE IMPACT 36 18CM CVD LR (INSTRUMENTS) ×3 IMPLANT
NS IRRIG 1000ML POUR BTL (IV SOLUTION) ×6 IMPLANT
PACK CYSTO (CUSTOM PROCEDURE TRAY) ×3 IMPLANT
PENCIL BUTTON HOLSTER BLD 10FT (ELECTRODE) ×3 IMPLANT
RELOAD PROXIMATE 75MM BLUE (ENDOMECHANICALS) ×12 IMPLANT
RTRCTR WOUND ALEXIS 18CM MED (MISCELLANEOUS)
SCALPEL HARMONIC ACE (MISCELLANEOUS) IMPLANT
SCISSORS LAP 5X35 DISP (ENDOMECHANICALS) ×3 IMPLANT
SEPRAFILM PROCEDURAL PACK 3X5 (MISCELLANEOUS) ×3 IMPLANT
SET IRRIG TUBING LAPAROSCOPIC (IRRIGATION / IRRIGATOR) ×3 IMPLANT
SLEEVE XCEL OPT CAN 5 100 (ENDOMECHANICALS) ×3 IMPLANT
SOLUTION ANTI FOG 6CC (MISCELLANEOUS) ×3 IMPLANT
SPONGE GAUZE 4X4 12PLY (GAUZE/BANDAGES/DRESSINGS) IMPLANT
SPONGE LAP 18X18 X RAY DECT (DISPOSABLE) ×9 IMPLANT
STAPLER CIRC CVD 29MM 37CM (STAPLE) ×3 IMPLANT
STAPLER PROXIMATE 75MM BLUE (STAPLE) ×3 IMPLANT
STAPLER VISISTAT 35W (STAPLE) ×3 IMPLANT
SUCTION POOLE TIP (SUCTIONS) ×3 IMPLANT
SUT ETHILON 3 0 PS 1 (SUTURE) ×3 IMPLANT
SUT PDS AB 1 CTX 36 (SUTURE) ×6 IMPLANT
SUT PDS AB 1 TP1 96 (SUTURE) IMPLANT
SUT PROLENE 2 0 SH DA (SUTURE) IMPLANT
SUT SILK 2 0 (SUTURE) ×2
SUT SILK 2 0 SH CR/8 (SUTURE) IMPLANT
SUT SILK 2-0 18XBRD TIE 12 (SUTURE) ×2 IMPLANT
SUT SILK 3 0 (SUTURE)
SUT SILK 3 0 SH CR/8 (SUTURE) IMPLANT
SUT SILK 3-0 18XBRD TIE 12 (SUTURE) IMPLANT
SUT VICRYL 2 0 18  UND BR (SUTURE) ×1
SUT VICRYL 2 0 18 UND BR (SUTURE) ×2 IMPLANT
SYS LAPSCP GELPORT 120MM (MISCELLANEOUS) ×3
SYSTEM LAPSCP GELPORT 120MM (MISCELLANEOUS) ×2 IMPLANT
TOWEL OR 17X26 10 PK STRL BLUE (TOWEL DISPOSABLE) ×6 IMPLANT
TRAY FOLEY CATH 14FRSI W/METER (CATHETERS) ×3 IMPLANT
TRAY LAP CHOLE (CUSTOM PROCEDURE TRAY) ×3 IMPLANT
TROCAR BLADELESS OPT 5 100 (ENDOMECHANICALS) ×3 IMPLANT
TROCAR BLADELESS OPT 5 75 (ENDOMECHANICALS) IMPLANT
TROCAR XCEL BLUNT TIP 100MML (ENDOMECHANICALS) IMPLANT
TROCAR XCEL NON-BLD 11X100MML (ENDOMECHANICALS) ×3 IMPLANT
TUBING INSUFFLATION 10FT LAP (TUBING) ×3 IMPLANT
YANKAUER SUCT BULB TIP 10FT TU (MISCELLANEOUS) ×3 IMPLANT
YANKAUER SUCT BULB TIP NO VENT (SUCTIONS) ×3 IMPLANT

## 2011-08-15 NOTE — Transfer of Care (Signed)
Immediate Anesthesia Transfer of Care Note  Patient: Erin [name]  Procedure(s) Performed:  SALPINGO OOPHERECTOMY; CYSTOSCOPY WITH RETROGRADE PYELOGRAM - ureteral catheter insertion; HAND ASSISTED LAPAROSCOPIC COLON RESECTION; APPENDECTOMY; TAKE DOWN OF INTESTINAL FISTULA - repair colo-vaginal fistula  Patient Location: PACU  Anesthesia Type: General  Level of Consciousness: awake, alert , oriented and sedated  Airway & Oxygen Therapy: Patient Spontanous Breathing and Patient connected to face mask oxygen  Post-op Assessment: Report given to PACU RN, Post -op Vital signs reviewed and stable and Patient moving all extremities  Post vital signs: Reviewed and stable  Complications: No apparent anesthesia complications

## 2011-08-15 NOTE — Anesthesia Preprocedure Evaluation (Addendum)
Anesthesia Evaluation  Patient identified by MRN, date of birth, ID band Patient awake    Reviewed: Allergy & Precautions, H&P , NPO status , Patient's Chart, lab work & pertinent test results  Airway Mallampati: II TM Distance: <3 FB Neck ROM: Full    Dental No notable dental hx.    Pulmonary neg pulmonary ROS,  clear to auscultation  Pulmonary exam normal       Cardiovascular hypertension, Pt. on medications Regular Normal    Neuro/Psych Negative Neurological ROS  Negative Psych ROS   GI/Hepatic Neg liver ROS, GERD-  Medicated,  Endo/Other  Negative Endocrine ROSHypothyroidism   Renal/GU negative Renal ROS  Genitourinary negative   Musculoskeletal negative musculoskeletal ROS (+)   Abdominal   Peds negative pediatric ROS (+)  Hematology negative hematology ROS (+)   Anesthesia Other Findings   Reproductive/Obstetrics negative OB ROS                          Anesthesia Physical Anesthesia Plan  ASA: II  Anesthesia Plan: General   Post-op Pain Management:    Induction: Intravenous  Airway Management Planned: Oral ETT  Additional Equipment:   Intra-op Plan:   Post-operative Plan: Extubation in OR  Informed Consent: I have reviewed the patients History and Physical, chart, labs and discussed the procedure including the risks, benefits and alternatives for the proposed anesthesia with the patient or authorized representative who has indicated his/her understanding and acceptance.   Dental advisory given  Plan Discussed with: CRNA  Anesthesia Plan Comments:         Anesthesia Quick Evaluation

## 2011-08-15 NOTE — Anesthesia Postprocedure Evaluation (Signed)
  Anesthesia Post-op Note  Patient: Erin [name]  Procedure(s) Performed:  SALPINGO OOPHERECTOMY; CYSTOSCOPY WITH RETROGRADE PYELOGRAM - ureteral catheter insertion; HAND ASSISTED LAPAROSCOPIC COLON RESECTION; APPENDECTOMY; TAKE DOWN OF INTESTINAL FISTULA - repair colo-vaginal fistula  Patient Location: PACU  Anesthesia Type: General  Level of Consciousness: awake and alert   Airway and Oxygen Therapy: Patient Spontanous Breathing  Post-op Pain: mild  Post-op Assessment: Post-op Vital signs reviewed, Patient's Cardiovascular Status Stable, Respiratory Function Stable, Patent Airway and No signs of Nausea or vomiting  Post-op Vital Signs: stable  Complications: No apparent anesthesia complications

## 2011-08-15 NOTE — Op Note (Signed)
PATIENT:  Erin [name]  PRE-OPERATIVE DIAGNOSIS: Colovaginal fistula  POST-OPERATIVE DIAGNOSIS:  Same  PROCEDURE:  Procedure(s):  1. Cystoscopy with left retrograde pyelogram including interpretation. 2. Left ureteral stent placement  SURGEON:  Garnett Farm  INDICATION: Erin [name] is a 71 year old female patient of Dr. Maris Berger who has developed a colovaginal fistula. Imaging studies reveal the colon closely adjacent to the left side of the bladder and extending to an area near the ureteral vesicle junction and anticipated course of the left ureter. Dr. Abbey Chatters had asked that I place a stent to aid in identification and preservation of the ureter during his portion of the surgery as well as a left retrograde pyelogram in order to determine any distortion or deviation of the course of the left ureter prior to his planned surgery.I went over the procedure with the patient in detail including its risks and potential complications. She understands and has elected to proceed.  ANESTHESIA:  General  EBL:  None  DRAINS: 1. 5 French open-ended ureteral catheter in the left ureter 2. 16 French Foley catheter in the bladder  Description of procedure: After informed consent the patient was taken to the major OR and placed in the table in the supine position. General anesthesia was administered and the patient was then placed in the lithotomy position. An official timeout was then performed.  Cystoscopy was performed by introducing a 22 French cystoscope under direct vision into the bladder. Bladder was Foley and systematically inspected. Both ureteral orifices appeared to be of normal configuration and position. The bladder had no tumor stones or inflammatory lesions and specifically there was no evidence of inflammation of the mucosa of the bladder especially on the left superior and inferior walls of the bladder.  A 5 French open-ended ureteral catheter was then passed through the  cystoscope and into the left ureteral orifice. Full-strength contrast was then injected to perform a retrograde pyelogram and a standard fashion. As I injected contrast I noted no deviation in the course of the ureter and no evidence of stricturing or mass effect. The remainder of the ureter and intrarenal collecting system were visualized and noted to be normal as well.  A 0.038 inch floppy-tipped guidewire was then passed through the open-ended catheter and up the left ureter under direct fluoroscopic control. I then passed the 5 [place] open-ended catheter over the guidewire into the area of the left renal pelvis and the guidewire was removed. The bladder was drained, the cystoscope removed and a 16 French Foley catheter inserted in the bladder. The open-ended stent was then secured to the catheter with a silk suture and connected to closed system drainage as well as the catheter and the procedure was ended. The patient tolerated this portion of the procedure well with no intraoperative complications. I have discussed with Dr. Abbey Chatters the fact that the open-ended catheter could be removed at the end of the case.  PLAN OF CARE: Discharge to home after PACU  PATIENT DISPOSITION:  PACU - hemodynamically stable.

## 2011-08-15 NOTE — Interval H&P Note (Signed)
History and Physical Interval Note:  08/15/2011 7:32 AM  Erin [name]  has presented today for surgery, with the diagnosis of colovaginal fistula  The various methods of treatment have been discussed with the patient and family. After consideration of risks, benefits and other options for treatment, the patient has consented to  Procedure(s): LAPAROSCOPIC PARTIAL COLECTOMY CYSTOSCOPY WITH STENT PLACEMENT as a surgical intervention .  The patients' history has been reviewed, patient examined, no change in status, stable for surgery.  I have reviewed the patients' chart and labs.  Questions were answered to the patient's satisfaction.     Tanielle Emigh Shela Commons

## 2011-08-15 NOTE — H&P (View-Only) (Signed)
Patient ID: Erin [name], female   DOB: 07/03/1941, 71 y.o.   MRN: [id number]  Chief Complaint  Patient presents with  . Other    est pt new prob- eval fistula     HPI Erin [name] is a 71 y.o. female.   HPI  She is referred by Dr. Meisinger for evaluation of a colovaginal fistula. On March 06, 2011 she reports the onset of brown foul-smelling vaginal discharge. She was placed on antibiotics by Dr. Avva but the discharge persisted. She states she also passes air out of her vagina at times. In October a colonoscopy was performed by Dr. Magod, I do not have the results of this yet. In November a CT scan of the abdomen and pelvis was performed which demonstrated findings consistent with a colovaginal fistula. She continues to have the brownish foul-smelling drainage and has to change the pads to 3 times a day. She is here to discuss further treatment of this.  Past Medical History  Diagnosis Date  . Bundle branch block   . Degenerative joint disease   . Diverticulosis   . GERD (gastroesophageal reflux disease)   . Hyperlipidemia   . Hypertension   . Thyroid disease     hypothyroidism  . Osteoarthritis   . Osteoporosis   . Ulcer     peptic  . Cancer     breast ca    Past Surgical History  Procedure Date  . Gastric ulcer repair   . Breast surgery     mastectomy right breast  . Abdominal hysterectomy   . Fracture surgery 2007    knee replacement    History reviewed. No pertinent family history.  Social History History  Substance Use Topics  . Smoking status: Never Smoker   . Smokeless tobacco: Not on file  . Alcohol Use: No    Allergies  Allergen Reactions  . Codeine   . Morphine And Related     Current Outpatient Prescriptions  Medication Sig Dispense Refill  . ALPRAZolam (NIRAVAM) 0.25 MG dissolvable tablet Take 0.25 mg by mouth at bedtime as needed.      . amLODipine (NORVASC) 5 MG tablet Take 5 mg by mouth daily.      . Calcium-Vitamin D (CALTRATE 600  PLUS-VIT D PO) Take by mouth.      . cyclobenzaprine (FLEXERIL) 10 MG tablet Take 10 mg by mouth 3 (three) times daily as needed.      . fish oil-omega-3 fatty acids 1000 MG capsule Take 2 g by mouth daily.      . hydrochlorothiazide (HYDRODIURIL) 25 MG tablet Take 25 mg by mouth daily.      . levothyroxine (SYNTHROID, LEVOTHROID) 50 MCG tablet Take 50 mcg by mouth daily.      . losartan (COZAAR) 50 MG tablet Take 50 mg by mouth daily.      . Magnesium 250 MG TABS Take by mouth.      . Multiple Vitamin (MULTIVITAMIN) tablet Take 1 tablet by mouth daily.      . omeprazole (PRILOSEC) 40 MG capsule Take 40 mg by mouth daily.        Review of Systems Review of Systems  Constitutional: Negative.   HENT: Negative.   Respiratory: Negative.   Cardiovascular: Negative.   Gastrointestinal:       Reflux  Genitourinary: Positive for vaginal discharge. Negative for dysuria, hematuria and difficulty urinating.  Neurological: Negative.   Hematological: Negative.     Blood pressure 156/92, pulse   80, temperature 97.8 F (36.6 C), temperature source Temporal, resp. rate 16, height 5' 2" (1.575 m), weight 147 lb 9.6 oz (66.951 kg).  Physical Exam Physical Exam  Constitutional: She appears well-developed and well-nourished. No distress.  HENT:  Head: Normocephalic and atraumatic.  Eyes: Conjunctivae and EOM are normal.  Neck: Neck supple.  Cardiovascular: Normal rate and regular rhythm.   No murmur heard. Pulmonary/Chest: Effort normal and breath sounds normal.  Abdominal: Soft. She exhibits no distension and no mass. There is no tenderness.       Upper midline scar.  Genitourinary:       Yellowish vaginal discharge.  Musculoskeletal: Normal range of motion. She exhibits no edema.  Lymphadenopathy:    She has no cervical adenopathy.  Neurological: She is alert.  Skin: Skin is warm and dry.  Psychiatric: She has a normal mood and affect. Her behavior is normal.    Data Reviewed Dr.  Meisinger's note.  CT scan.  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 Clinically there does not appear to be bladder involvement although the bladder appears to be close to this process on CT scan.    Plan    Laparoscopic-assisted possible open partial colectomy with repair of colovaginal fistula and left ureteral stent placement by urology. I've explained the procedure risks and aftercare extensively with her and her daughter.    Risks include but are not limited to bleeding, infection, wound problems, anesthesia, anastomotic leak, need for colostomy, injury to intraabominal organs (such as intestine, spleen, kidney, bladder, ureter, etc.), ileus, irregular bowel habits.  She seems to understand and agrees to proceed.       Diedra Sinor J 07/21/2011, 5:11 PM    

## 2011-08-15 NOTE — Op Note (Signed)
Operative Note  Erin [name] female 71 y.o. 08/15/2011  PREOPERATIVE DX:  Colovaginal fistula  POSTOPERATIVE DX:  Same  PROCEDURE:  Laparoscopic assisted rectosigmoid resection with mobilization of the splenic flexure, left salpingo-oophorectomy, appendectomy, repair of fistula         Surgeon: Adolph Pollack   Assistants: Ezzard Standing, DAVID  Anesthesia: General endotracheal anesthesia  Indications:   This is a 71 year old female who had a vaginal hysterectomy a number of years ago. In August of 2012, she began having a feculent discharge from her vagina. This did not respond to antibiotics. Further evaluation demonstrated findings consistent with a colovaginal fistula. The inflammatory process also potentially involves the bladder and distal left ureter. She now presents for the above procedure. A  ureteral stent is going to be placed in the left ureter as well.  We have discussed the procedure, risks, and aftercare extensively preoperatively.    Procedure Detail:  She was seen in the holding area. She is brought to the operating room placed supine on the operating table and a general anesthetic was administered. She was placed in the lithotomy position. Stool was then suctioned from both the vagina and rectum were was draining. Dr. Vernie Ammons then performed cystoscopy and insertion of a left ureteral stent. He did not seek any inflammatory changes in the bladder.   Following this, the abdominal wall and perineal areas were sterilely prepped and draped. A small lower midline incision was made dividing the skin, subcutaneous tissue, fascia, and peritoneum. Once I entered the peritoneal cavity a GelPort device was placed. A 5 mm trocar was then placed in the left lower quadrant and a pneumoperitoneum created. Following this an 11 mm trocar was placed in the lower epigastrium and a 5mm trocar was placed in the right midabdomen.No underlying bleeding or injuries noted were the trochars were  inserted.  Using hand assistance I visualized an area of dense adhesions between what appeared to be sigmoid colon and vaginal cuff area. The sigmoid colon proximal to this and descending colon were mobilized by dividing lateral attachments. Using sharp dissection and slight electrocautery the splenic flexure was mobilized. The distal half of the transverse colon was mobilized free from the omentum. This allowed the splenic flexure to drop below the umbilicus. I was able to feel the ureter and identified the stent in it and traced it inferiorly. Again using sharp dissection this divide some of the peritoneal attachments down to the area were very firm mass was densely adherent to the vaginal cuff on the left side. I was not able to dissect this free laparoscopically.  I subsequently removed the GelPort device and slightly lengthened the incision of the lower midline area inferiorly. Under direct vision I began using blunt dissection and electrocautery to separate a very firm inflammatory mass from the vaginal cuff on the left side. The inflammatory mass was also densely adherent to the left fallopian tube and ovary and this was kept en bloc with the inflammatory colonic mass. I traced the ureter all the way down and it was just inferior to this inflammatory process. It was intact. Once I had separated the inflammatory mass from the vaginal cuff I noted the defect in the vaginal cuff which was very small. There was no defect in the bladder although there was dissection performed on the bladder. The sigmoid colon in this inflammatory area was looped back on itself somewhat. I divided the descending colon at the descending colon sigmoid colon junction with the GIA stapler. I  then divided the mid rectum with the linear cutting stapler as well. The mesentery was divided close to the colon with the LigaSure device. The distal aspect of the specimen was marked. It was then sent to pathology.  The specimen included the  sigmoid colon, or the proximal rectum, and the left ovary and fallopian tube.  I then irrigated out the area and no excessive bleeding was noted. The left ureter was intact with the stent in place. The bladder was intact. I closed the vaginal cuff defect with interrupted 2-0 Vicryl sutures and left these long period  I then planned to do an EEA anastomosis between the distal descending colon in the mid rectum. I excised the staple line at the distal descending colon and introduced the anvil into the lumen. I then closed the defect resected part of this distal descending colon with the GIA stapler. I brought the tip of the anvil out the side of the descending colon for a Baker-type anastomosis. A pursestring suture of 2-0 Prolene was placed around the anvil and tied down.  The anus and rectum were then dilated and the handle of a #29 EEA stapler was introduced through the rectum. An end rectum to side descending colon a circular anastomosis was then performed a finger had been placed into the vagina to make sure that the vaginal closure was solid and that the stapler was in the rectum.  Once the anastomoses had been completed, 2 intact donuts were noted. The distal donut was sent as the distal resection margin. The anastomosis was then placed under water and air was injected through the rectum and there is no evidence of leak.  The appendix was hanging down into the pelvis close to the area of vaginal cuff closure and I decided to perform an appendectomy. The mesoappendix was divided with the ligature. The appendix was then amputated off the cecum with the GIA stapler and sent to pathology.  Copious irrigation was then performed the abdominal cavity and the fluid evacuated.  There is no evidence of further bleeding or organ injury. The left lower quadrant trocar was removed and a drain was placed through this site. The tip of the transiently to the pelvis and was anchored to the skin with nylon suture. All  sponges were removed from the abdominal cavity. The omentum could not be pulled down as a flap given her previous gastric surgery.  Thus 3 pieces of Seprafilm were placed over the vaginal cuff repair and the sutures were then cut. Seprafilm was then placed in the rest of the abdominal cavity near the lower midline incision.  The lower midline fascia was then closed with a running #1 PDS suture. All trochars were removed.  The subcutaneous tissue the incisions were irrigated. All skin incisions were closed with staples. Telfa wicks were placed in the gaps left in the lower midline incision. A bulky dressing was applied. The drain was hooked to closed suction. The stent in the left ureter was then removed.  She tolerated the procedure well without any apparent complications and was taken to the recovery room in satisfactory condition.    Estimated Blood Loss:  300 mL         Drains: JACKSON-PRATT (JP)          Blood Given: none          Specimens: Rectosigmoid colon, left fallopian tube and ovary, appendix        Complications:  * No complications entered in OR log *  Disposition: PACU - hemodynamically stable.         Condition: stable

## 2011-08-16 LAB — CBC
HCT: 29 % — ABNORMAL LOW (ref 36.0–46.0)
Hemoglobin: 9.9 g/dL — ABNORMAL LOW (ref 12.0–15.0)
MCH: 31.3 pg (ref 26.0–34.0)
MCHC: 34.1 g/dL (ref 30.0–36.0)
MCV: 91.8 fL (ref 78.0–100.0)
Platelets: 101 10*3/uL — ABNORMAL LOW (ref 150–400)
RBC: 3.16 MIL/uL — ABNORMAL LOW (ref 3.87–5.11)
RDW: 12.7 % (ref 11.5–15.5)
WBC: 14.4 10*3/uL — ABNORMAL HIGH (ref 4.0–10.5)

## 2011-08-16 LAB — BASIC METABOLIC PANEL WITH GFR
BUN: 17 mg/dL (ref 6–23)
CO2: 21 meq/L (ref 19–32)
Calcium: 8.5 mg/dL (ref 8.4–10.5)
Chloride: 105 meq/L (ref 96–112)
Creatinine, Ser: 1.04 mg/dL (ref 0.50–1.10)
GFR calc Af Amer: 61 mL/min — ABNORMAL LOW
GFR calc non Af Amer: 53 mL/min — ABNORMAL LOW
Glucose, Bld: 154 mg/dL — ABNORMAL HIGH (ref 70–99)
Potassium: 4 meq/L (ref 3.5–5.1)
Sodium: 134 meq/L — ABNORMAL LOW (ref 135–145)

## 2011-08-16 MED ORDER — HYDROMORPHONE 0.3 MG/ML IV SOLN
INTRAVENOUS | Status: AC
  Filled 2011-08-16: qty 25

## 2011-08-16 NOTE — Progress Notes (Signed)
Patient ID: KRISANNE LICH, female   DOB: 18-Apr-1941, 71 y.o.   MRN: [id number]  General Surgery - [facility] - Progress Note  POD# 1  Subjective: Patient without complaint.  Mild pain. No nausea.  Family at bedside.  Objective: Vital signs in last 24 hours: Temp:  [97.3 F (36.3 C)-98.2 F (36.8 C)] 98.1 F (36.7 C) (02/09 0620) Pulse Rate:  [62-76] 67  (02/09 0620) Resp:  [10-18] 18  (02/09 0758) BP: (100-120)/(41-67) 117/43 mmHg (02/09 0620) SpO2:  [95 %-100 %] 97 % (02/09 0758) FiO2 (%):  [100 %] 100 % (02/08 1215) Weight:  [149 lb (67.586 kg)] 149 lb (67.586 kg) (02/08 1315) Last BM Date: 08/14/11  Intake/Output from previous day: 02/08 0701 - 02/09 0700 In: 4735.2 [I.V.:4735.2] Out: 1606 [Urine:1326; Drains:180; Blood:100]  Exam: HEENT - clear, not icteric Neck - soft Chest - clear bilaterally Cor - RRR, no murmur Abd - soft with mild distension; BS present; dressings dry and intact Ext - no significant edema Neuro - grossly intact, no focal deficits  Lab Results:   Basename 08/16/11 0443 08/15/11 1144  WBC 14.4* 17.8*  HGB 9.9* 11.1*  HCT 29.0* 32.7*  PLT 101* 116*     Basename 08/16/11 0443 08/15/11 1652  NA 134* --  K 4.0 --  CL 105 --  CO2 21 --  GLUCOSE 154* --  BUN 17 --  CREATININE 1.04 1.26*  CALCIUM 8.5 --    Studies/Results: Dg C-arm 1-60 Min-no Report  08/15/2011  CLINICAL DATA: intraop   C-ARM 1-60 MINUTES  Fluoroscopy was utilized by the requesting physician.  No radiographic  interpretation.      Assessment: Status post colonic resection, oophorectomy, appendectomy  Plan: Allow clear liquid diet OOB, ambulate with assist IV hydration PCA for pain control   Velora Heckler, MD, FACS General & Endocrine Surgery [facility]  08/16/2011

## 2011-08-16 NOTE — Plan of Care (Signed)
Problem: Phase I Progression Outcomes Goal: OOB as tolerated unless otherwise ordered Outcome: Progressing Dangled on side of bed.

## 2011-08-17 DIAGNOSIS — K5790 Diverticulosis of intestine, part unspecified, without perforation or abscess without bleeding: Secondary | ICD-10-CM | POA: Insufficient documentation

## 2011-08-17 DIAGNOSIS — E039 Hypothyroidism, unspecified: Secondary | ICD-10-CM | POA: Diagnosis present

## 2011-08-17 DIAGNOSIS — I10 Essential (primary) hypertension: Secondary | ICD-10-CM | POA: Diagnosis present

## 2011-08-17 DIAGNOSIS — M1990 Unspecified osteoarthritis, unspecified site: Secondary | ICD-10-CM | POA: Diagnosis present

## 2011-08-17 MED ORDER — CYCLOBENZAPRINE HCL 10 MG PO TABS
10.0000 mg | ORAL_TABLET | Freq: Every day | ORAL | Status: DC | PRN
  Filled 2011-08-17: qty 1

## 2011-08-17 MED ORDER — ACETAMINOPHEN 325 MG PO TABS
650.0000 mg | ORAL_TABLET | Freq: Four times a day (QID) | ORAL | Status: DC
  Administered 2011-08-17 – 2011-08-18 (×8): 650 mg via ORAL
  Filled 2011-08-17 (×12): qty 2

## 2011-08-17 MED ORDER — PSYLLIUM 95 % PO PACK
1.0000 | PACK | Freq: Every day | ORAL | Status: DC
  Administered 2011-08-17 – 2011-08-18 (×2): 1 via ORAL
  Filled 2011-08-17 (×3): qty 1

## 2011-08-17 MED ORDER — FLORA-Q PO CAPS
1.0000 | ORAL_CAPSULE | Freq: Every day | ORAL | Status: DC
  Administered 2011-08-17 – 2011-08-18 (×2): 1 via ORAL
  Filled 2011-08-17 (×3): qty 1

## 2011-08-17 MED ORDER — ADULT MULTIVITAMIN W/MINERALS CH
1.0000 | ORAL_TABLET | Freq: Every day | ORAL | Status: DC
  Administered 2011-08-17 – 2011-08-18 (×2): 1 via ORAL
  Filled 2011-08-17 (×3): qty 1

## 2011-08-17 MED ORDER — ALUM & MAG HYDROXIDE-SIMETH 200-200-20 MG/5ML PO SUSP: 30.0000 mL | Freq: Four times a day (QID) | ORAL | Status: DC | PRN

## 2011-08-17 NOTE — Progress Notes (Signed)
Erin [name] 09-01-1940 [id number]  PCP: Erin Sauer, MD, MD Outpatient Care Team: Patient Care Team: Erin Sauer, MD as PCP - General (Internal Medicine)  Inpatient Treatment Team: Treatment Team: Attending Provider: Adolph Pollack, MD; Registered Nurse: Erin Kenner, RN; Technician: Erin [name], NT; Technician: Erin [name], NT; Technician: Erin [name], NT; Registered Nurse: Erin Berthold, RN  Subjective:  No major events Tol clears - wants potato soup PCA working Walking in room Daughter in room  Objective:  Vital signs:  Temp:  [97.3 F (36.3 C)-98.1 F (36.7 C)] 98.1 F (36.7 C) (02/10 0510) Pulse Rate:  [65-76] 76  (02/10 0510) Resp:  [16-20] 18  (02/10 0747) BP: (104-146)/(55-69) 115/60 mmHg (02/10 0510) SpO2:  [93 %-99 %] 97 % (02/10 0747) Last BM Date: 08/16/11  Intake/Output   Yesterday:  02/09 0701 - 02/10 0700 In: 3244.0 [P.O.:820; I.V.:2979.2] Out: 2795 [Urine:2750; Drains:45] This shift:     Bowel function:  Flatus: Yes  BM: x2 small  Physical Exam:  General: Pt awake/alert/oriented x4 in no acute distress Eyes: PERRL, normal EOM.  Sclera clear.  No icterus Neuro: CN II-XII intact w/o focal sensory/motor deficits. Lymph: No head/neck/groin lymphadenopathy Psych:  No delerium/psychosis/paranoia HENT: Normocephalic, Mucus membranes moist.  No thrush Neck: Supple, No tracheal deviation Chest: Clear  No chest wall pain w good excursion CV:  Pulses intact.  Regular rhythm Abdomen: Soft, Mild/mod tender at incisions.  Mild/mod distended.  No incarcerated hernias.  Incisions clean.  Drain serosanguinous Ext:  SCDs BLE.  No mjr edema.  No cyanosis Skin: No petechiae / purpurae  Results:   Labs: Results for orders placed during the hospital encounter of 08/15/11 (from the past 48 hour(s))  CBC     Status: Abnormal   Collection Time   08/15/11 11:44 AM      Component Value Range Comment   WBC 17.8  (*) 4.0 - 10.5 (K/uL)    RBC 3.59 (*) 3.87 - 5.11 (MIL/uL)    Hemoglobin 11.1 (*) 12.0 - 15.0 (g/dL)    HCT 10.2 (*) 72.5 - 46.0 (%)    MCV 91.1  78.0 - 100.0 (fL)    MCH 30.9  26.0 - 34.0 (pg)    MCHC 33.9  30.0 - 36.0 (g/dL)    RDW 36.6  44.0 - 34.7 (%)    Platelets 116 (*) 150 - 400 (K/uL)   CREATININE, SERUM     Status: Abnormal   Collection Time   08/15/11  4:52 PM      Component Value Range Comment   Creatinine, Ser 1.26 (*) 0.50 - 1.10 (mg/dL)    GFR calc non Af Amer 42 (*) >90 (mL/min)    GFR calc Af Amer 48 (*) >90 (mL/min)   BASIC METABOLIC PANEL     Status: Abnormal   Collection Time   08/16/11  4:43 AM      Component Value Range Comment   Sodium 134 (*) 135 - 145 (mEq/L)    Potassium 4.0  3.5 - 5.1 (mEq/L)    Chloride 105  96 - 112 (mEq/L)    CO2 21  19 - 32 (mEq/L)    Glucose, Bld 154 (*) 70 - 99 (mg/dL)    BUN 17  6 - 23 (mg/dL)    Creatinine, Ser 4.25  0.50 - 1.10 (mg/dL)    Calcium 8.5  8.4 - 10.5 (mg/dL)    GFR calc non Af Amer 53 (*) >90 (mL/min)  GFR calc Af Amer 61 (*) >90 (mL/min)   CBC     Status: Abnormal   Collection Time   08/16/11  4:43 AM      Component Value Range Comment   WBC 14.4 (*) 4.0 - 10.5 (K/uL)    RBC 3.16 (*) 3.87 - 5.11 (MIL/uL)    Hemoglobin 9.9 (*) 12.0 - 15.0 (g/dL)    HCT 21.3 (*) 08.6 - 46.0 (%)    MCV 91.8  78.0 - 100.0 (fL)    MCH 31.3  26.0 - 34.0 (pg)    MCHC 34.1  30.0 - 36.0 (g/dL)    RDW 57.8  46.9 - 62.9 (%)    Platelets 101 (*) 150 - 400 (K/uL) CONSISTENT WITH PREVIOUS RESULT    Imaging / Studies: No results found.  Medications / Allergies: per chart  Antibiotics: Anti-infectives     Start     Dose/Rate Route Frequency Ordered Stop   08/15/11 1400   cefOXitin (MEFOXIN) 2 g in dextrose 5 % 50 mL IVPB        2 g 100 mL/hr over 30 Minutes Intravenous Every 6 hours 08/15/11 1307     08/15/11 0700   vancomycin (VANCOCIN) IVPB 1000 mg/200 mL premix     Comments: stat      1,000 mg 200 mL/hr over 60 Minutes  Intravenous  Once 08/15/11 0647 08/15/11 0720   08/15/11 0615   cefOXitin (MEFOXIN) 1 g in dextrose 5 % 50 mL IVPB        1 g 100 mL/hr over 30 Minutes Intravenous 60 min pre-op 08/15/11 5284 08/15/11 0751          Assessment  Erin [name]  71 y.o. female  2 Days Post-Op  Procedure(s): TAKE DOWN OF COLOVAGINAL FISTULA HAND ASSISTED LAPAROSCOPIC COLON RESECTION  SALPINGO OOPHERECTOMY CYSTOSCOPY WITH RETROGRADE PYELOGRAM APPENDECTOMY   Problem List:  Principal Problem:  *Colovaginal fistula Active Problems:  Hypertension  Bundle branch block  Hypothyroidism  Osteoarthritis   Slowly improving  Plan: -full liq -wean off IVF -VTE prophylaxis- SCDs, etc -mobilize as tolerated to help recovery - PT eval PRN -thyroid - stable -HTN - controlled -f/u path next week -keep foley with pelvic surgery & hematuria - prob D/C in 1-2 days if OK w Dr. Abbey [name] [facility]   Erin [name], M.D., F.A.C.S. Gastrointestinal and Minimally Invasive Surgery [facility] [address] [place], [place] [address] [phone] Main / Paging [phone] Voice Mail   08/17/2011

## 2011-08-18 MED ORDER — OXYCODONE-ACETAMINOPHEN 5-325 MG PO TABS
1.0000 | ORAL_TABLET | ORAL | Status: DC | PRN
  Administered 2011-08-18 – 2011-08-19 (×2): 2 via ORAL
  Filled 2011-08-18 (×2): qty 2

## 2011-08-18 NOTE — Progress Notes (Signed)
3 Days Post-Op  Subjective: Tolerating full liquids.  Walked yesterday.  Passing gas.  Bowels moving.  Objective: Vital signs in last 24 hours: Temp:  [97.9 F (36.6 C)-98.5 F (36.9 C)] 97.9 F (36.6 C) (02/11 0535) Pulse Rate:  [63-75] 73  (02/11 0535) Resp:  [16-20] 18  (02/11 0535) BP: (138-157)/(53-83) 150/83 mmHg (02/11 0535) SpO2:  [95 %-99 %] 97 % (02/11 0535) Last BM Date: 08/17/11  Intake/Output from previous day: 02/10 0701 - 02/11 0700 In: 2585 [P.O.:600; I.V.:1985] Out: 2635 [Urine:2600; Drains:35] Intake/Output this shift:    PE: Abd-soft, lower midline incision is clean with wicks in, other incisions clean and intact  Lab Results:   Basename 08/16/11 0443 08/15/11 1144  WBC 14.4* 17.8*  HGB 9.9* 11.1*  HCT 29.0* 32.7*  PLT 101* 116*   BMET  Basename 08/16/11 0443 08/15/11 1652  NA 134* --  K 4.0 --  CL 105 --  CO2 21 --  GLUCOSE 154* --  BUN 17 --  CREATININE 1.04 1.26*  CALCIUM 8.5 --   PT/INR No results found for this basename: LABPROT:2,INR:2 in the last 72 hours Comprehensive Metabolic Panel:    Component Value Date/Time   NA 134* 08/16/2011 0443   K 4.0 08/16/2011 0443   CL 105 08/16/2011 0443   CO2 21 08/16/2011 0443   BUN 17 08/16/2011 0443   CREATININE 1.04 08/16/2011 0443   GLUCOSE 154* 08/16/2011 0443   CALCIUM 8.5 08/16/2011 0443   AST 18 08/11/2011 0945   ALT 13 08/11/2011 0945   ALKPHOS 100 08/11/2011 0945   BILITOT 0.2* 08/11/2011 0945   PROT 7.2 08/11/2011 0945   ALBUMIN 3.7 08/11/2011 0945     Studies/Results: No results found.  Anti-infectives: Anti-infectives     Start     Dose/Rate Route Frequency Ordered Stop   08/15/11 1400   cefOXitin (MEFOXIN) 2 g in dextrose 5 % 50 mL IVPB        2 g 100 mL/hr over 30 Minutes Intravenous Every 6 hours 08/15/11 1307     08/15/11 0700   vancomycin (VANCOCIN) IVPB 1000 mg/200 mL premix     Comments: stat      1,000 mg 200 mL/hr over 60 Minutes Intravenous  Once 08/15/11 0647 08/15/11 0720   08/15/11 0615   cefOXitin (MEFOXIN) 1 g in dextrose 5 % 50 mL IVPB        1 g 100 mL/hr over 30 Minutes Intravenous 60 min pre-op 08/15/11 0865 08/15/11 0751          Assessment Principal Problem:  *Colovaginal fistula-s/p partial colectomy/left SO/appendectomy-progressing well. Active Problems:  Hypertension-adequate control  Bundle branch block  Hypothyroidism  Osteoarthritis  Thrombocytopenia-mild and present preop    LOS: 3 days   Plan: D/C PCA and foley. Advance diet.  Oral analgesic.   Ramiyah Mcclenahan J 08/18/2011

## 2011-08-18 NOTE — Progress Notes (Signed)
Foley catheter removed. Patient tolerated procedure. 300 cc output in drainage bag. Will monitor for voids.no c/o pain at this time

## 2011-08-19 ENCOUNTER — Other Ambulatory Visit (INDEPENDENT_AMBULATORY_CARE_PROVIDER_SITE_OTHER): Payer: Self-pay | Admitting: General Surgery

## 2011-08-19 MED ORDER — ONDANSETRON HCL 4 MG PO TABS: 4.0000 mg | ORAL_TABLET | Freq: Four times a day (QID) | ORAL | Status: AC | PRN

## 2011-08-19 MED ORDER — OXYCODONE-ACETAMINOPHEN 5-325 MG PO TABS: 1.0000 | ORAL_TABLET | ORAL | Status: AC | PRN

## 2011-08-19 MED ORDER — AMOXICILLIN-POT CLAVULANATE 875-125 MG PO TABS: 1.0000 | ORAL_TABLET | Freq: Two times a day (BID) | ORAL | Status: AC

## 2011-08-19 MED FILL — Sodium Chloride Flush IV Soln 0.9%: INTRAVENOUS | Qty: 3 | Status: AC

## 2011-08-19 MED FILL — Bupivacaine HCl Preservative Free (PF) Inj 0.5%: INTRAMUSCULAR | Qty: 30 | Status: AC

## 2011-08-19 NOTE — Progress Notes (Signed)
4 Days Post-Op  Subjective: Tolerating diet.  Moving easier.  Passing gas and moving bowels.  Objective: Vital signs in last 24 hours: Temp:  [97.8 F (36.6 C)-98.8 F (37.1 C)] 97.8 F (36.6 C) (02/12 0559) Pulse Rate:  [60-75] 66  (02/12 0559) Resp:  [16-18] 17  (02/12 0559) BP: (127-171)/(63-85) 158/85 mmHg (02/12 0559) SpO2:  [97 %-99 %] 98 % (02/12 0559) Last BM Date: 08/18/11  Intake/Output from previous day: 02/11 0701 - 02/12 0700 In: 1841.3 [P.O.:480; I.V.:701.3; IV Piggyback:650] Out: 1560 [Urine:1550; Drains:10] Intake/Output this shift:    PE: Abd-soft, incision clean, wicks removed from lower midline incision, serous drain output (drain was removed).  Lab Results:  No results found for this basename: WBC:2,HGB:2,HCT:2,PLT:2 in the last 72 hours BMET No results found for this basename: NA:2,K:2,CL:2,CO2:2,GLUCOSE:2,BUN:2,CREATININE:2,CALCIUM:2 in the last 72 hours PT/INR No results found for this basename: LABPROT:2,INR:2 in the last 72 hours Comprehensive Metabolic Panel:    Component Value Date/Time   NA 134* 08/16/2011 0443   K 4.0 08/16/2011 0443   CL 105 08/16/2011 0443   CO2 21 08/16/2011 0443   BUN 17 08/16/2011 0443   CREATININE 1.04 08/16/2011 0443   GLUCOSE 154* 08/16/2011 0443   CALCIUM 8.5 08/16/2011 0443   AST 18 08/11/2011 0945   ALT 13 08/11/2011 0945   ALKPHOS 100 08/11/2011 0945   BILITOT 0.2* 08/11/2011 0945   PROT 7.2 08/11/2011 0945   ALBUMIN 3.7 08/11/2011 0945     Studies/Results: No results found.  Anti-infectives: Anti-infectives     Start     Dose/Rate Route Frequency Ordered Stop   08/15/11 1400   cefOXitin (MEFOXIN) 2 g in dextrose 5 % 50 mL IVPB        2 g 100 mL/hr over 30 Minutes Intravenous Every 6 hours 08/15/11 1307     08/15/11 0700   vancomycin (VANCOCIN) IVPB 1000 mg/200 mL premix     Comments: stat      1,000 mg 200 mL/hr over 60 Minutes Intravenous  Once 08/15/11 0647 08/15/11 0720   08/15/11 0615   cefOXitin (MEFOXIN) 1 g in  dextrose 5 % 50 mL IVPB        1 g 100 mL/hr over 30 Minutes Intravenous 60 min pre-op 08/15/11 6962 08/15/11 0751         Pathology: Acute diverticulitis with perforation  Assessment Principal Problem:  *Colovaginal fistula-s/p partial colectomy and repair; path as above; doing well. Active Problems:  Hypertension  Bundle branch block  Hypothyroidism  Osteoarthritis    LOS: 4 days   Plan: Discharge on p.o. abxs given path.  Instructions given.  Return to office Friday for staple removal.   Sinclair Arrazola J 08/19/2011

## 2011-08-19 NOTE — Discharge Instructions (Signed)
[facility]      [facility], [place] [phone]  OPEN ABDOMINAL SURGERY: POST OP INSTRUCTIONS  Always review your discharge instruction sheet given to you by the facility where your surgery was performed.  IF YOU HAVE DISABILITY OR FAMILY LEAVE FORMS, YOU MUST BRING THEM TO THE OFFICE FOR PROCESSING.  PLEASE DO NOT GIVE THEM TO YOUR DOCTOR.  1. A prescription for pain medication may be given to you upon discharge.  Take your pain medication as prescribed, if needed.  If narcotic pain medicine is not needed, then you may take acetaminophen (Tylenol) or ibuprofen (Advil) as needed. 2. Take your usually prescribed medications unless otherwise directed. 3. If you need a refill on your pain medication, please contact your pharmacy. They will contact our office to request authorization.  Prescriptions will not be filled after 5pm or on week-ends. 4. You should follow a  high-fiber, low fat diet.   5. Most patients will experience some swelling and bruising in the area of the incision. Ice pack will help. Swelling and bruising can take several days to resolve..  6. It is common to experience some constipation if taking pain medication after surgery.  Increasing fluid intake and taking a stool softener will usually help or prevent this problem from occurring.  A mild laxative (Milk of Magnesia or Miralax) should be taken according to package directions if there are no bowel movements after 48 hours. 7.  You may have steri-strips (small skin tapes) in place directly over the incision.  These strips should be left on the skin for 7-10 days.  If your surgeon used skin glue on the incision, you may shower in 24 hours.  The glue will flake off over the next 2-3 weeks.  Any sutures or staples will be removed at the office during your follow-up visit. You may find that a light gauze bandage over your incision may keep your staples from being rubbed or pulled. You may shower and replace the bandage  daily. 8. ACTIVITIES:  You may resume regular (light) daily activities beginning the next day--such as daily self-care, walking, climbing stairs--gradually increasing activities as tolerated.  You may have sexual intercourse when it is comfortable.  Refrain from any heavy lifting or straining9 nothing over 10 pounds for 6 weeks) until approved by your doctor. a. You may drive when you no longer are taking prescription pain medication, you can comfortably wear a seatbelt, and you can safely maneuver your car and apply brakes b. Return to Work: ___________________________________ 9. You should see your doctor in the office for a follow-up appointment approximately two weeks after your surgery.  Make sure that you call for this appointment within a day or two after you arrive home to insure a convenient appointment time. OTHER INSTRUCTIONS:  __Do not put anything in your vagina for 6 weeks.___________________________________________________________ _____________________________________________________________  WHEN TO CALL YOUR DOCTOR: 1. Fever over 101.5 2. Inability to urinate 3. Nausea and/or vomiting 4. Extreme swelling or bruising 5. Continued bleeding from incision. 6. Increased pain, redness, or drainage from the incision. 7. Difficulty swallowing or breathing 8. Muscle cramping or spasms. 9. Numbness or tingling in hands or feet or around lips.  The clinic staff is available to answer your questions during regular business hours.  Please don't hesitate to call and ask to speak to one of the nurses if you have concerns.  For further questions, please visit [email or web address]

## 2011-08-22 ENCOUNTER — Encounter (INDEPENDENT_AMBULATORY_CARE_PROVIDER_SITE_OTHER): Payer: Medicare HMO

## 2011-08-22 ENCOUNTER — Encounter (INDEPENDENT_AMBULATORY_CARE_PROVIDER_SITE_OTHER): Payer: Self-pay | Admitting: General Surgery

## 2011-08-22 NOTE — Discharge Summary (Signed)
Physician Discharge Summary  Patient ID: Erin [name] MRN: [id number] DOB/AGE: Jan 26, 1941 71 y.o.  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 She tolerated the procedure well. A drain was left in the pelvis and drained thin serosanguineous fluid.  She'll be started on a clear liquid diet her first postoperative day and her diet was steadily advanced. She began having return of bowel function including flatus and stool.  I placed small wicks in her lower midline extraction site wound and these were removed. By the fourth postoperative day she was ambulating independently, tolerating a solid diet, the wound looked good, the drain was removed, and she is ready for discharge. She was given discharge instructions.  Consults: None  Significant Diagnostic Studies: None  Treatments: surgery: Laparoscopic assisted sigmoid colectomy, left salpingo-oophorectomy, appendectomy, repair of colovaginal fistula 08/15/2011.  Discharge Exam: Blood pressure 173/74, pulse 73, temperature 98.3 F (36.8 C), temperature source Oral, resp. rate 18, height 5\' 1"  (1.549 m), weight 149 lb (67.586 kg), SpO2 98.00%.   Disposition: Home or Self Care  Discharge Orders    Future Appointments: Provider: Department: Dept Phone: Center:   08/22/2011 2:00 PM [facility] [phone] None     Medication List  As of 08/22/2011 12:04 PM   TAKE these medications         amLODipine 5 MG tablet   Commonly known as: NORVASC   Take 5 mg by mouth  daily.      amoxicillin-clavulanate 875-125 MG per tablet   Commonly known as: AUGMENTIN   Take 1 tablet by mouth 2 (two) times daily.      CALTRATE 600 PLUS-VIT D PO   Take 1 tablet by mouth daily.      cyclobenzaprine 10 MG tablet   Commonly known as: FLEXERIL   Take 10 mg by mouth daily as needed. For muscle spasms.      fish oil-omega-3 fatty acids 1000 MG capsule   Take 1 g by mouth daily.      hydrochlorothiazide 25 MG tablet   Commonly known as: HYDRODIURIL   Take 25 mg by mouth daily.      ibuprofen 200 MG tablet   Commonly known as: ADVIL,MOTRIN   Take 600 mg by mouth every 8 (eight) hours as needed. For pain.      levothyroxine 50 MCG tablet   Commonly known as: SYNTHROID, LEVOTHROID   Take 50 mcg by mouth daily before breakfast.      losartan 50 MG tablet   Commonly known as: COZAAR   Take 50 mg by mouth 2 (two) times daily.      Magnesium 250 MG Tabs   Take 1 tablet by mouth daily.      mulitivitamin with minerals Tabs   Take 1 tablet by mouth daily.      omeprazole 40 MG capsule   Commonly known as: PRILOSEC   Take 40 mg by mouth daily.      oxyCODONE-acetaminophen 5-325 MG per tablet   Commonly known as: PERCOCET   Take 1-2 tablets by mouth every 4 (four) hours as needed.  Signed: Adolph Pollack 08/22/2011, 12:04 PM

## 2011-08-22 NOTE — Progress Notes (Signed)
Patient ID: Erin [name], female   DOB: 1940/08/06, 71 y.o.   MRN: [id number] She is here to have her staples removed POD#7 from laparoscopic assisted sigmoid colectomy, LSO, appendectomy and repair of colovaginal fistula.  She is eating well, her bowels are moving, she denies vaginal discharge.  Exam:  Gen-wdwn, nad               Abd-soft, incisions are clean and dry, 1.5 cm superficial skin separation in lower midline wound.  Staples were removed and steri strips were applied; the superficial area was left open.  Path:  Diverticulosis and diverticulitis.  Assess:  Doing well postop.  Staples removed today.  Plan: Shower daily and clean wound.  Put a dry dressing over superficial open area daily.  Continue light activities.  Return visit in 2-3 weeks.

## 2011-09-08 ENCOUNTER — Encounter (HOSPITAL_COMMUNITY): Payer: Self-pay | Admitting: General Surgery

## 2011-09-11 ENCOUNTER — Ambulatory Visit (INDEPENDENT_AMBULATORY_CARE_PROVIDER_SITE_OTHER): Payer: Medicare HMO | Admitting: General Surgery

## 2011-09-11 ENCOUNTER — Encounter (INDEPENDENT_AMBULATORY_CARE_PROVIDER_SITE_OTHER): Payer: Self-pay | Admitting: General Surgery

## 2011-09-11 VITALS — BP 162/94 | HR 100 | Temp 98.4°F | Resp 18 | Ht 61.5 in | Wt 141.2 lb

## 2011-09-11 DIAGNOSIS — Z9889 Other specified postprocedural states: Secondary | ICD-10-CM

## 2011-09-11 NOTE — Progress Notes (Signed)
Operation: Laparoscopic assisted sigmoid colectomy for colovaginal fistula  Date:  Pathology: Diverticulitis and diverticulosis  HPI: She is here for another postoperative visit. She is eating well. Bowels are moving. She is having minimal pain.   Physical Exam: Erin [name] looks well and is in no acute distress.  Abdomen-lower midline incision has a stable and it which I removed. There is one small superficial opening that is clean.   Assessment: She is making good postoperative progress.  Her blood pressure is elevated today at 162/94. She states it has been elevated recently.  Plan: Keep a Band-Aid over the superficial open area. High fiber diet. She may drive. Avoid heavy lifting. Call Erin [name] if her blood pressure remains elevated

## 2011-09-11 NOTE — Patient Instructions (Signed)
Call Dr. Felipa Eth if your blood pressure remains elevated.  You may drive.  No heavy lifting.

## 2011-09-17 ENCOUNTER — Encounter (INDEPENDENT_AMBULATORY_CARE_PROVIDER_SITE_OTHER): Payer: Self-pay

## 2011-09-29 ENCOUNTER — Ambulatory Visit (INDEPENDENT_AMBULATORY_CARE_PROVIDER_SITE_OTHER): Payer: Medicare HMO

## 2011-09-29 VITALS — BP 132/90 | Ht 62.0 in | Wt 146.4 lb

## 2011-09-29 DIAGNOSIS — N824 Other female intestinal-genital tract fistulae: Secondary | ICD-10-CM

## 2011-09-29 DIAGNOSIS — N823 Fistula of vagina to large intestine: Secondary | ICD-10-CM

## 2011-09-29 NOTE — Patient Instructions (Signed)
Pt seen today for draining wound at umbilical area.  The wound has redness around it and is warm and firm to touch.  Dr. Magnus Ivan wrote a Rx for doxycycline 100mg  x 10 days and the pt will see Dr. Abbey Chatters tomorrow for possible I & D of abscess.

## 2011-09-30 ENCOUNTER — Ambulatory Visit (INDEPENDENT_AMBULATORY_CARE_PROVIDER_SITE_OTHER): Payer: Medicare HMO | Admitting: General Surgery

## 2011-09-30 VITALS — BP 140/80 | HR 45 | Temp 97.1°F | Resp 14 | Ht 62.0 in

## 2011-09-30 DIAGNOSIS — Z09 Encounter for follow-up examination after completed treatment for conditions other than malignant neoplasm: Secondary | ICD-10-CM

## 2011-09-30 NOTE — Patient Instructions (Signed)
Wash area in shower and cover with dry gauze pad twice a day

## 2011-09-30 NOTE — Progress Notes (Signed)
Operation: Laparoscopic assisted sigmoid colectomy for colovaginal fistula  Date: August 15, 2011  Pathology: Diverticulosis and diverticulitis  HPI: She is here for another postoperative visit. She developed redness around the lower portion of her incision and some clear drainage. She was seen in the office yesterday and started on some doxycycline. She states the area is significantly better.   Physical Exam: Abdomen-soft, small superficial open area in the lower part of the incision with minimal erythema and induration.   Assessment: Superficial wound-I opened this up a little more and cleaned with peroxide.  Plan: Continue doxycycline for 10 days. Clean the wound twice a day and apply a dry pad. Return visit April 11

## 2011-10-02 ENCOUNTER — Encounter (INDEPENDENT_AMBULATORY_CARE_PROVIDER_SITE_OTHER): Payer: Self-pay

## 2011-10-16 ENCOUNTER — Ambulatory Visit (INDEPENDENT_AMBULATORY_CARE_PROVIDER_SITE_OTHER): Payer: Medicare HMO | Admitting: General Surgery

## 2011-10-16 ENCOUNTER — Encounter (INDEPENDENT_AMBULATORY_CARE_PROVIDER_SITE_OTHER): Payer: Self-pay | Admitting: General Surgery

## 2011-10-16 VITALS — BP 160/92 | HR 76 | Temp 97.4°F | Resp 18 | Ht 61.75 in | Wt 147.2 lb

## 2011-10-16 DIAGNOSIS — Z9889 Other specified postprocedural states: Secondary | ICD-10-CM

## 2011-10-16 NOTE — Progress Notes (Signed)
Operation: Laparoscopic assisted sigmoid colectomy for colovaginal fistula  Date: August 15, 2011  Pathology: Diverticulosis and diverticulitis  HPI: She is here for another postoperative visit.  She is still having some drainage from the small open area.  Physical Exam: Abdomen-soft, small superficial open area in the lower part of the incision with granulation tissue present.  No erythema or purulent drainage.  Silver nitrate was applied to the area.   Assessment: Superficial wound opening-slowly healing.  Plan:  Dry dressing to wound until it heals.  If the wound is still draining after one month will have her call us.

## 2011-10-16 NOTE — Patient Instructions (Signed)
Dry dressing on area until it heals then for one week after that.  Call if the area is still draining after one months time.  Avoid hot tubs or swimming pools until the area heals.

## 2012-01-06 ENCOUNTER — Other Ambulatory Visit: Payer: Self-pay | Admitting: *Deleted

## 2012-04-05 ENCOUNTER — Other Ambulatory Visit (HOSPITAL_COMMUNITY): Payer: Self-pay | Admitting: Internal Medicine

## 2012-04-05 DIAGNOSIS — Z1231 Encounter for screening mammogram for malignant neoplasm of breast: Secondary | ICD-10-CM

## 2012-04-21 ENCOUNTER — Ambulatory Visit (HOSPITAL_COMMUNITY)
Admission: RE | Admit: 2012-04-21 | Discharge: 2012-04-21 | Disposition: A | Discharge status: Home / Self Care | Payer: Medicare HMO | Source: Ambulatory Visit | Attending: Internal Medicine | Admitting: Internal Medicine

## 2012-04-21 ENCOUNTER — Other Ambulatory Visit (HOSPITAL_COMMUNITY): Payer: Self-pay | Admitting: Internal Medicine

## 2012-04-21 DIAGNOSIS — Z1231 Encounter for screening mammogram for malignant neoplasm of breast: Secondary | ICD-10-CM

## 2012-04-23 ENCOUNTER — Other Ambulatory Visit: Payer: Self-pay | Admitting: Internal Medicine

## 2012-04-23 DIAGNOSIS — R928 Other abnormal and inconclusive findings on diagnostic imaging of breast: Secondary | ICD-10-CM

## 2012-04-27 ENCOUNTER — Ambulatory Visit
Admission: RE | Admit: 2012-04-27 | Discharge: 2012-04-27 | Disposition: A | Discharge status: Home / Self Care | Payer: Medicare HMO | Source: Ambulatory Visit | Attending: Internal Medicine | Admitting: Internal Medicine

## 2012-04-27 DIAGNOSIS — R928 Other abnormal and inconclusive findings on diagnostic imaging of breast: Secondary | ICD-10-CM

## 2012-10-13 ENCOUNTER — Other Ambulatory Visit: Payer: Self-pay | Admitting: Internal Medicine

## 2012-10-13 DIAGNOSIS — N63 Unspecified lump in unspecified breast: Secondary | ICD-10-CM

## 2012-10-13 DIAGNOSIS — Z9011 Acquired absence of right breast and nipple: Secondary | ICD-10-CM

## 2012-10-13 DIAGNOSIS — N6320 Unspecified lump in the left breast, unspecified quadrant: Secondary | ICD-10-CM

## 2012-10-27 ENCOUNTER — Other Ambulatory Visit: Payer: Self-pay | Admitting: Internal Medicine

## 2012-10-27 ENCOUNTER — Ambulatory Visit
Admission: RE | Admit: 2012-10-27 | Discharge: 2012-10-27 | Disposition: A | Discharge status: Home / Self Care | Payer: Medicare HMO | Source: Ambulatory Visit | Attending: Internal Medicine | Admitting: Internal Medicine

## 2012-10-27 DIAGNOSIS — N6320 Unspecified lump in the left breast, unspecified quadrant: Secondary | ICD-10-CM

## 2012-10-27 DIAGNOSIS — Z9011 Acquired absence of right breast and nipple: Secondary | ICD-10-CM

## 2013-12-23 ENCOUNTER — Other Ambulatory Visit (HOSPITAL_COMMUNITY): Payer: Self-pay | Admitting: Internal Medicine

## 2013-12-23 DIAGNOSIS — Z1231 Encounter for screening mammogram for malignant neoplasm of breast: Secondary | ICD-10-CM

## 2013-12-26 ENCOUNTER — Ambulatory Visit (HOSPITAL_COMMUNITY)
Admission: RE | Admit: 2013-12-26 | Discharge: 2013-12-26 | Disposition: A | Discharge status: Home / Self Care | Payer: Medicare HMO | Source: Ambulatory Visit | Attending: Internal Medicine | Admitting: Internal Medicine

## 2013-12-26 ENCOUNTER — Other Ambulatory Visit (HOSPITAL_COMMUNITY): Payer: Self-pay | Admitting: Internal Medicine

## 2013-12-26 DIAGNOSIS — Z1231 Encounter for screening mammogram for malignant neoplasm of breast: Secondary | ICD-10-CM

## 2013-12-26 DIAGNOSIS — R928 Other abnormal and inconclusive findings on diagnostic imaging of breast: Secondary | ICD-10-CM | POA: Insufficient documentation

## 2013-12-27 ENCOUNTER — Other Ambulatory Visit: Payer: Self-pay | Admitting: Internal Medicine

## 2013-12-27 DIAGNOSIS — R928 Other abnormal and inconclusive findings on diagnostic imaging of breast: Secondary | ICD-10-CM

## 2014-01-05 ENCOUNTER — Ambulatory Visit
Admission: RE | Admit: 2014-01-05 | Discharge: 2014-01-05 | Disposition: A | Discharge status: Home / Self Care | Payer: Commercial Managed Care - HMO | Source: Ambulatory Visit | Attending: Internal Medicine | Admitting: Internal Medicine

## 2014-01-05 ENCOUNTER — Other Ambulatory Visit: Payer: Self-pay | Admitting: Internal Medicine

## 2014-01-05 DIAGNOSIS — R928 Other abnormal and inconclusive findings on diagnostic imaging of breast: Secondary | ICD-10-CM

## 2014-01-05 DIAGNOSIS — N6002 Solitary cyst of left breast: Secondary | ICD-10-CM

## 2014-01-13 ENCOUNTER — Ambulatory Visit
Admission: RE | Admit: 2014-01-13 | Discharge: 2014-01-13 | Disposition: A | Discharge status: Home / Self Care | Payer: Commercial Managed Care - HMO | Source: Ambulatory Visit | Attending: Internal Medicine | Admitting: Internal Medicine

## 2014-01-13 DIAGNOSIS — N6002 Solitary cyst of left breast: Secondary | ICD-10-CM

## 2014-06-27 NOTE — Care Management Note (Unsigned)
    Page 1 of 2   06/28/2014     1:11:18 PM CARE MANAGEMENT NOTE 06/28/2014  Patient:  EMALEY, APPLIN   Account Number:  [id number]  Date Initiated:  06/27/2014  Documentation initiated by:  [facility]  Subjective/Objective Assessment:   adm: LTKR     Action/Plan:   discharge planning   Anticipated DC Date:  06/30/2014   Anticipated DC Plan:  [place]  CM consult      [facility] Choice  HOME HEALTH   Choice offered to / List presented to:  C-1 Patient   DME arranged  3-N-1  [facility]assie Moselle      DME agency  [place] arranged  [place].   Status of service:  In process, will continue to follow Medicare Important Message given?   (If response is "NO", the following Medicare IM given date fields will be blank) Date Medicare IM given:   Medicare IM given by:   Date Additional Medicare IM given:   Additional Medicare IM given by:    Discharge Disposition:  [place]  Per UR Regulation:    If discussed at Long Length of Stay Meetings, dates discussed:    Comments:  06/27/14 14;15 Cm met with pt in room to offer choice of home health agency.  Pt chooses [facility] to render HHPT.  Pt will need 3n1 and rolling walker.  Address and contact information verified with pt.  Referral called to [facility] rep, Stephanie.  Request for DME orders and HHPT order made.  CM will monitor for orders. Mariane Masters, BSN, CM [phone].

## 2014-07-11 DIAGNOSIS — M859 Disorder of bone density and structure, unspecified: Secondary | ICD-10-CM | POA: Diagnosis not present

## 2014-11-08 DIAGNOSIS — R7301 Impaired fasting glucose: Secondary | ICD-10-CM | POA: Diagnosis not present

## 2014-11-08 DIAGNOSIS — I10 Essential (primary) hypertension: Secondary | ICD-10-CM | POA: Diagnosis not present

## 2014-11-08 DIAGNOSIS — L259 Unspecified contact dermatitis, unspecified cause: Secondary | ICD-10-CM | POA: Diagnosis not present

## 2014-11-08 DIAGNOSIS — Z6829 Body mass index (BMI) 29.0-29.9, adult: Secondary | ICD-10-CM | POA: Diagnosis not present

## 2014-11-08 DIAGNOSIS — E039 Hypothyroidism, unspecified: Secondary | ICD-10-CM | POA: Diagnosis not present

## 2014-11-08 DIAGNOSIS — N183 Chronic kidney disease, stage 3 (moderate): Secondary | ICD-10-CM | POA: Diagnosis not present

## 2014-11-08 DIAGNOSIS — M810 Age-related osteoporosis without current pathological fracture: Secondary | ICD-10-CM | POA: Diagnosis not present

## 2014-11-08 DIAGNOSIS — E785 Hyperlipidemia, unspecified: Secondary | ICD-10-CM | POA: Diagnosis not present

## 2014-11-28 DIAGNOSIS — Z6829 Body mass index (BMI) 29.0-29.9, adult: Secondary | ICD-10-CM | POA: Diagnosis not present

## 2014-11-28 DIAGNOSIS — N183 Chronic kidney disease, stage 3 (moderate): Secondary | ICD-10-CM | POA: Diagnosis not present

## 2014-11-28 DIAGNOSIS — I10 Essential (primary) hypertension: Secondary | ICD-10-CM | POA: Diagnosis not present

## 2014-11-28 DIAGNOSIS — J302 Other seasonal allergic rhinitis: Secondary | ICD-10-CM | POA: Diagnosis not present

## 2015-01-22 DIAGNOSIS — R300 Dysuria: Secondary | ICD-10-CM | POA: Diagnosis not present

## 2015-01-22 DIAGNOSIS — R8299 Other abnormal findings in urine: Secondary | ICD-10-CM | POA: Diagnosis not present

## 2015-02-08 DIAGNOSIS — Z1389 Encounter for screening for other disorder: Secondary | ICD-10-CM | POA: Diagnosis not present

## 2015-02-08 DIAGNOSIS — D696 Thrombocytopenia, unspecified: Secondary | ICD-10-CM | POA: Diagnosis not present

## 2015-02-08 DIAGNOSIS — I10 Essential (primary) hypertension: Secondary | ICD-10-CM | POA: Diagnosis not present

## 2015-02-08 DIAGNOSIS — N183 Chronic kidney disease, stage 3 (moderate): Secondary | ICD-10-CM | POA: Diagnosis not present

## 2015-02-08 DIAGNOSIS — Z6828 Body mass index (BMI) 28.0-28.9, adult: Secondary | ICD-10-CM | POA: Diagnosis not present

## 2015-02-08 DIAGNOSIS — R7301 Impaired fasting glucose: Secondary | ICD-10-CM | POA: Diagnosis not present

## 2015-04-02 ENCOUNTER — Other Ambulatory Visit: Payer: Self-pay

## 2015-04-02 DIAGNOSIS — Z1231 Encounter for screening mammogram for malignant neoplasm of breast: Secondary | ICD-10-CM

## 2015-04-02 DIAGNOSIS — Z9011 Acquired absence of right breast and nipple: Secondary | ICD-10-CM

## 2015-04-04 ENCOUNTER — Ambulatory Visit
Admission: RE | Admit: 2015-04-04 | Discharge: 2015-04-04 | Disposition: A | Discharge status: Home / Self Care | Payer: Commercial Managed Care - HMO | Source: Ambulatory Visit

## 2015-04-04 DIAGNOSIS — Z1231 Encounter for screening mammogram for malignant neoplasm of breast: Secondary | ICD-10-CM

## 2015-04-04 DIAGNOSIS — Z9011 Acquired absence of right breast and nipple: Secondary | ICD-10-CM

## 2015-05-21 DIAGNOSIS — E039 Hypothyroidism, unspecified: Secondary | ICD-10-CM | POA: Diagnosis not present

## 2015-05-21 DIAGNOSIS — E785 Hyperlipidemia, unspecified: Secondary | ICD-10-CM | POA: Diagnosis not present

## 2015-05-21 DIAGNOSIS — N183 Chronic kidney disease, stage 3 (moderate): Secondary | ICD-10-CM | POA: Diagnosis not present

## 2015-05-21 DIAGNOSIS — R8299 Other abnormal findings in urine: Secondary | ICD-10-CM | POA: Diagnosis not present

## 2015-05-21 DIAGNOSIS — N390 Urinary tract infection, site not specified: Secondary | ICD-10-CM | POA: Diagnosis not present

## 2015-05-21 DIAGNOSIS — R7301 Impaired fasting glucose: Secondary | ICD-10-CM | POA: Diagnosis not present

## 2015-05-21 DIAGNOSIS — M810 Age-related osteoporosis without current pathological fracture: Secondary | ICD-10-CM | POA: Diagnosis not present

## 2015-05-28 DIAGNOSIS — D696 Thrombocytopenia, unspecified: Secondary | ICD-10-CM | POA: Diagnosis not present

## 2015-05-28 DIAGNOSIS — Z853 Personal history of malignant neoplasm of breast: Secondary | ICD-10-CM | POA: Diagnosis not present

## 2015-05-28 DIAGNOSIS — M810 Age-related osteoporosis without current pathological fracture: Secondary | ICD-10-CM | POA: Diagnosis not present

## 2015-05-28 DIAGNOSIS — R7301 Impaired fasting glucose: Secondary | ICD-10-CM | POA: Diagnosis not present

## 2015-05-28 DIAGNOSIS — F419 Anxiety disorder, unspecified: Secondary | ICD-10-CM | POA: Diagnosis not present

## 2015-05-28 DIAGNOSIS — E038 Other specified hypothyroidism: Secondary | ICD-10-CM | POA: Diagnosis not present

## 2015-05-28 DIAGNOSIS — N183 Chronic kidney disease, stage 3 (moderate): Secondary | ICD-10-CM | POA: Diagnosis not present

## 2015-05-28 DIAGNOSIS — Z0000 Encounter for general adult medical examination without abnormal findings: Secondary | ICD-10-CM | POA: Diagnosis not present

## 2015-05-29 DIAGNOSIS — Z1212 Encounter for screening for malignant neoplasm of rectum: Secondary | ICD-10-CM | POA: Diagnosis not present

## 2015-08-01 DIAGNOSIS — E785 Hyperlipidemia, unspecified: Secondary | ICD-10-CM | POA: Diagnosis not present

## 2015-08-01 DIAGNOSIS — I10 Essential (primary) hypertension: Secondary | ICD-10-CM | POA: Diagnosis not present

## 2015-08-01 DIAGNOSIS — N183 Chronic kidney disease, stage 3 (moderate): Secondary | ICD-10-CM | POA: Diagnosis not present

## 2015-08-01 DIAGNOSIS — Z6829 Body mass index (BMI) 29.0-29.9, adult: Secondary | ICD-10-CM | POA: Diagnosis not present

## 2015-09-24 DIAGNOSIS — R05 Cough: Secondary | ICD-10-CM | POA: Diagnosis not present

## 2015-09-24 DIAGNOSIS — J040 Acute laryngitis: Secondary | ICD-10-CM | POA: Diagnosis not present

## 2015-09-24 DIAGNOSIS — Z6829 Body mass index (BMI) 29.0-29.9, adult: Secondary | ICD-10-CM | POA: Diagnosis not present

## 2015-09-24 DIAGNOSIS — J019 Acute sinusitis, unspecified: Secondary | ICD-10-CM | POA: Diagnosis not present

## 2015-09-24 DIAGNOSIS — R6883 Chills (without fever): Secondary | ICD-10-CM | POA: Diagnosis not present

## 2015-11-09 DIAGNOSIS — I10 Essential (primary) hypertension: Secondary | ICD-10-CM | POA: Diagnosis not present

## 2015-11-09 DIAGNOSIS — K219 Gastro-esophageal reflux disease without esophagitis: Secondary | ICD-10-CM | POA: Diagnosis not present

## 2015-11-09 DIAGNOSIS — N183 Chronic kidney disease, stage 3 (moderate): Secondary | ICD-10-CM | POA: Diagnosis not present

## 2015-11-09 DIAGNOSIS — F419 Anxiety disorder, unspecified: Secondary | ICD-10-CM | POA: Diagnosis not present

## 2015-11-09 DIAGNOSIS — E038 Other specified hypothyroidism: Secondary | ICD-10-CM | POA: Diagnosis not present

## 2015-11-09 DIAGNOSIS — J302 Other seasonal allergic rhinitis: Secondary | ICD-10-CM | POA: Diagnosis not present

## 2015-11-09 DIAGNOSIS — R7301 Impaired fasting glucose: Secondary | ICD-10-CM | POA: Diagnosis not present

## 2015-11-09 DIAGNOSIS — Z1389 Encounter for screening for other disorder: Secondary | ICD-10-CM | POA: Diagnosis not present

## 2015-11-09 DIAGNOSIS — E784 Other hyperlipidemia: Secondary | ICD-10-CM | POA: Diagnosis not present

## 2015-11-27 DIAGNOSIS — K219 Gastro-esophageal reflux disease without esophagitis: Secondary | ICD-10-CM | POA: Diagnosis not present

## 2015-11-27 DIAGNOSIS — J040 Acute laryngitis: Secondary | ICD-10-CM | POA: Diagnosis not present

## 2015-11-27 DIAGNOSIS — R05 Cough: Secondary | ICD-10-CM | POA: Diagnosis not present

## 2015-11-27 DIAGNOSIS — J302 Other seasonal allergic rhinitis: Secondary | ICD-10-CM | POA: Diagnosis not present

## 2015-11-27 DIAGNOSIS — I10 Essential (primary) hypertension: Secondary | ICD-10-CM | POA: Diagnosis not present

## 2016-02-29 DIAGNOSIS — Z6828 Body mass index (BMI) 28.0-28.9, adult: Secondary | ICD-10-CM | POA: Diagnosis not present

## 2016-02-29 DIAGNOSIS — I10 Essential (primary) hypertension: Secondary | ICD-10-CM | POA: Diagnosis not present

## 2016-02-29 DIAGNOSIS — K219 Gastro-esophageal reflux disease without esophagitis: Secondary | ICD-10-CM | POA: Diagnosis not present

## 2016-03-26 DIAGNOSIS — I10 Essential (primary) hypertension: Secondary | ICD-10-CM | POA: Diagnosis not present

## 2016-03-26 DIAGNOSIS — Z23 Encounter for immunization: Secondary | ICD-10-CM | POA: Diagnosis not present

## 2016-03-31 DIAGNOSIS — I10 Essential (primary) hypertension: Secondary | ICD-10-CM | POA: Diagnosis not present

## 2016-04-18 ENCOUNTER — Other Ambulatory Visit: Payer: Self-pay | Admitting: Internal Medicine

## 2016-04-18 DIAGNOSIS — Z1231 Encounter for screening mammogram for malignant neoplasm of breast: Secondary | ICD-10-CM

## 2016-04-30 ENCOUNTER — Ambulatory Visit
Admission: RE | Admit: 2016-04-30 | Discharge: 2016-04-30 | Disposition: A | Discharge status: Home / Self Care | Payer: Commercial Managed Care - HMO | Source: Ambulatory Visit | Attending: Internal Medicine | Admitting: Internal Medicine

## 2016-04-30 DIAGNOSIS — Z1231 Encounter for screening mammogram for malignant neoplasm of breast: Secondary | ICD-10-CM

## 2016-05-28 DIAGNOSIS — I10 Essential (primary) hypertension: Secondary | ICD-10-CM | POA: Diagnosis not present

## 2016-05-28 DIAGNOSIS — E784 Other hyperlipidemia: Secondary | ICD-10-CM | POA: Diagnosis not present

## 2016-05-28 DIAGNOSIS — M810 Age-related osteoporosis without current pathological fracture: Secondary | ICD-10-CM | POA: Diagnosis not present

## 2016-05-28 DIAGNOSIS — R7301 Impaired fasting glucose: Secondary | ICD-10-CM | POA: Diagnosis not present

## 2016-05-28 DIAGNOSIS — E038 Other specified hypothyroidism: Secondary | ICD-10-CM | POA: Diagnosis not present

## 2016-06-04 DIAGNOSIS — N183 Chronic kidney disease, stage 3 (moderate): Secondary | ICD-10-CM | POA: Diagnosis not present

## 2016-06-04 DIAGNOSIS — M810 Age-related osteoporosis without current pathological fracture: Secondary | ICD-10-CM | POA: Diagnosis not present

## 2016-06-04 DIAGNOSIS — Z0000 Encounter for general adult medical examination without abnormal findings: Secondary | ICD-10-CM | POA: Diagnosis not present

## 2016-06-04 DIAGNOSIS — E038 Other specified hypothyroidism: Secondary | ICD-10-CM | POA: Diagnosis not present

## 2016-06-04 DIAGNOSIS — E784 Other hyperlipidemia: Secondary | ICD-10-CM | POA: Diagnosis not present

## 2016-06-04 DIAGNOSIS — I10 Essential (primary) hypertension: Secondary | ICD-10-CM | POA: Diagnosis not present

## 2016-06-04 DIAGNOSIS — I4510 Unspecified right bundle-branch block: Secondary | ICD-10-CM | POA: Diagnosis not present

## 2016-06-04 DIAGNOSIS — D696 Thrombocytopenia, unspecified: Secondary | ICD-10-CM | POA: Diagnosis not present

## 2016-06-04 DIAGNOSIS — R7301 Impaired fasting glucose: Secondary | ICD-10-CM | POA: Diagnosis not present

## 2016-06-17 DIAGNOSIS — M109 Gout, unspecified: Secondary | ICD-10-CM | POA: Diagnosis not present

## 2016-06-17 DIAGNOSIS — I10 Essential (primary) hypertension: Secondary | ICD-10-CM | POA: Diagnosis not present

## 2016-06-18 DIAGNOSIS — M109 Gout, unspecified: Secondary | ICD-10-CM | POA: Diagnosis not present

## 2016-06-18 DIAGNOSIS — I10 Essential (primary) hypertension: Secondary | ICD-10-CM | POA: Diagnosis not present

## 2016-08-04 DIAGNOSIS — M109 Gout, unspecified: Secondary | ICD-10-CM | POA: Diagnosis not present

## 2016-08-04 DIAGNOSIS — I10 Essential (primary) hypertension: Secondary | ICD-10-CM | POA: Diagnosis not present

## 2016-12-02 DIAGNOSIS — R7301 Impaired fasting glucose: Secondary | ICD-10-CM | POA: Diagnosis not present

## 2016-12-02 DIAGNOSIS — Z6827 Body mass index (BMI) 27.0-27.9, adult: Secondary | ICD-10-CM | POA: Diagnosis not present

## 2016-12-02 DIAGNOSIS — M810 Age-related osteoporosis without current pathological fracture: Secondary | ICD-10-CM | POA: Diagnosis not present

## 2016-12-02 DIAGNOSIS — M109 Gout, unspecified: Secondary | ICD-10-CM | POA: Diagnosis not present

## 2016-12-02 DIAGNOSIS — N183 Chronic kidney disease, stage 3 (moderate): Secondary | ICD-10-CM | POA: Diagnosis not present

## 2016-12-02 DIAGNOSIS — I10 Essential (primary) hypertension: Secondary | ICD-10-CM | POA: Diagnosis not present

## 2016-12-02 DIAGNOSIS — D696 Thrombocytopenia, unspecified: Secondary | ICD-10-CM | POA: Diagnosis not present

## 2016-12-24 DIAGNOSIS — R300 Dysuria: Secondary | ICD-10-CM | POA: Diagnosis not present

## 2016-12-24 DIAGNOSIS — N390 Urinary tract infection, site not specified: Secondary | ICD-10-CM | POA: Diagnosis not present

## 2017-01-13 DIAGNOSIS — N183 Chronic kidney disease, stage 3 (moderate): Secondary | ICD-10-CM | POA: Diagnosis not present

## 2017-01-13 DIAGNOSIS — Z6828 Body mass index (BMI) 28.0-28.9, adult: Secondary | ICD-10-CM | POA: Diagnosis not present

## 2017-01-13 DIAGNOSIS — I10 Essential (primary) hypertension: Secondary | ICD-10-CM | POA: Diagnosis not present

## 2017-05-02 DIAGNOSIS — Z23 Encounter for immunization: Secondary | ICD-10-CM | POA: Diagnosis not present

## 2017-06-18 DIAGNOSIS — E7849 Other hyperlipidemia: Secondary | ICD-10-CM | POA: Diagnosis not present

## 2017-06-18 DIAGNOSIS — M810 Age-related osteoporosis without current pathological fracture: Secondary | ICD-10-CM | POA: Diagnosis not present

## 2017-06-18 DIAGNOSIS — M109 Gout, unspecified: Secondary | ICD-10-CM | POA: Diagnosis not present

## 2017-06-18 DIAGNOSIS — N183 Chronic kidney disease, stage 3 (moderate): Secondary | ICD-10-CM | POA: Diagnosis not present

## 2017-06-18 DIAGNOSIS — E038 Other specified hypothyroidism: Secondary | ICD-10-CM | POA: Diagnosis not present

## 2017-06-18 DIAGNOSIS — R7301 Impaired fasting glucose: Secondary | ICD-10-CM | POA: Diagnosis not present

## 2017-06-18 DIAGNOSIS — R82998 Other abnormal findings in urine: Secondary | ICD-10-CM | POA: Diagnosis not present

## 2017-06-23 ENCOUNTER — Other Ambulatory Visit: Payer: Self-pay | Admitting: Internal Medicine

## 2017-06-23 DIAGNOSIS — Z1231 Encounter for screening mammogram for malignant neoplasm of breast: Secondary | ICD-10-CM

## 2017-06-25 DIAGNOSIS — R7301 Impaired fasting glucose: Secondary | ICD-10-CM | POA: Diagnosis not present

## 2017-06-25 DIAGNOSIS — I4510 Unspecified right bundle-branch block: Secondary | ICD-10-CM | POA: Diagnosis not present

## 2017-06-25 DIAGNOSIS — I10 Essential (primary) hypertension: Secondary | ICD-10-CM | POA: Diagnosis not present

## 2017-06-25 DIAGNOSIS — E7849 Other hyperlipidemia: Secondary | ICD-10-CM | POA: Diagnosis not present

## 2017-06-25 DIAGNOSIS — N183 Chronic kidney disease, stage 3 (moderate): Secondary | ICD-10-CM | POA: Diagnosis not present

## 2017-06-25 DIAGNOSIS — Z0000 Encounter for general adult medical examination without abnormal findings: Secondary | ICD-10-CM | POA: Diagnosis not present

## 2017-06-25 DIAGNOSIS — M109 Gout, unspecified: Secondary | ICD-10-CM | POA: Diagnosis not present

## 2017-06-25 DIAGNOSIS — Z853 Personal history of malignant neoplasm of breast: Secondary | ICD-10-CM | POA: Diagnosis not present

## 2017-06-25 DIAGNOSIS — D696 Thrombocytopenia, unspecified: Secondary | ICD-10-CM | POA: Diagnosis not present

## 2017-07-02 DIAGNOSIS — Z1212 Encounter for screening for malignant neoplasm of rectum: Secondary | ICD-10-CM | POA: Diagnosis not present

## 2017-07-02 DIAGNOSIS — M810 Age-related osteoporosis without current pathological fracture: Secondary | ICD-10-CM | POA: Diagnosis not present

## 2017-07-24 ENCOUNTER — Ambulatory Visit
Admission: RE | Admit: 2017-07-24 | Discharge: 2017-07-24 | Disposition: A | Discharge status: Home / Self Care | Payer: Commercial Managed Care - HMO | Source: Ambulatory Visit | Attending: Internal Medicine | Admitting: Internal Medicine

## 2017-07-24 DIAGNOSIS — Z1231 Encounter for screening mammogram for malignant neoplasm of breast: Secondary | ICD-10-CM

## 2017-08-20 DIAGNOSIS — M25551 Pain in right hip: Secondary | ICD-10-CM | POA: Diagnosis not present

## 2017-08-20 DIAGNOSIS — Z96651 Presence of right artificial knee joint: Secondary | ICD-10-CM | POA: Diagnosis not present

## 2017-09-23 DIAGNOSIS — J069 Acute upper respiratory infection, unspecified: Secondary | ICD-10-CM | POA: Diagnosis not present

## 2017-09-23 DIAGNOSIS — I10 Essential (primary) hypertension: Secondary | ICD-10-CM | POA: Diagnosis not present

## 2017-09-23 DIAGNOSIS — Z6828 Body mass index (BMI) 28.0-28.9, adult: Secondary | ICD-10-CM | POA: Diagnosis not present

## 2017-10-27 DIAGNOSIS — Z6828 Body mass index (BMI) 28.0-28.9, adult: Secondary | ICD-10-CM | POA: Diagnosis not present

## 2017-10-27 DIAGNOSIS — I10 Essential (primary) hypertension: Secondary | ICD-10-CM | POA: Diagnosis not present

## 2017-10-27 DIAGNOSIS — N183 Chronic kidney disease, stage 3 (moderate): Secondary | ICD-10-CM | POA: Diagnosis not present

## 2017-12-21 DIAGNOSIS — D696 Thrombocytopenia, unspecified: Secondary | ICD-10-CM | POA: Diagnosis not present

## 2017-12-21 DIAGNOSIS — M109 Gout, unspecified: Secondary | ICD-10-CM | POA: Diagnosis not present

## 2017-12-21 DIAGNOSIS — Z6828 Body mass index (BMI) 28.0-28.9, adult: Secondary | ICD-10-CM | POA: Diagnosis not present

## 2017-12-21 DIAGNOSIS — M1990 Unspecified osteoarthritis, unspecified site: Secondary | ICD-10-CM | POA: Diagnosis not present

## 2017-12-21 DIAGNOSIS — Z1389 Encounter for screening for other disorder: Secondary | ICD-10-CM | POA: Diagnosis not present

## 2017-12-21 DIAGNOSIS — M810 Age-related osteoporosis without current pathological fracture: Secondary | ICD-10-CM | POA: Diagnosis not present

## 2017-12-21 DIAGNOSIS — R7301 Impaired fasting glucose: Secondary | ICD-10-CM | POA: Diagnosis not present

## 2017-12-21 DIAGNOSIS — N183 Chronic kidney disease, stage 3 (moderate): Secondary | ICD-10-CM | POA: Diagnosis not present

## 2017-12-21 DIAGNOSIS — I10 Essential (primary) hypertension: Secondary | ICD-10-CM | POA: Diagnosis not present

## 2018-06-14 ENCOUNTER — Other Ambulatory Visit: Payer: Self-pay | Admitting: Internal Medicine

## 2018-06-14 DIAGNOSIS — Z1231 Encounter for screening mammogram for malignant neoplasm of breast: Secondary | ICD-10-CM

## 2018-07-23 DIAGNOSIS — M810 Age-related osteoporosis without current pathological fracture: Secondary | ICD-10-CM | POA: Diagnosis not present

## 2018-07-23 DIAGNOSIS — E7849 Other hyperlipidemia: Secondary | ICD-10-CM | POA: Diagnosis not present

## 2018-07-23 DIAGNOSIS — R7301 Impaired fasting glucose: Secondary | ICD-10-CM | POA: Diagnosis not present

## 2018-07-23 DIAGNOSIS — M109 Gout, unspecified: Secondary | ICD-10-CM | POA: Diagnosis not present

## 2018-07-23 DIAGNOSIS — I10 Essential (primary) hypertension: Secondary | ICD-10-CM | POA: Diagnosis not present

## 2018-07-23 DIAGNOSIS — E038 Other specified hypothyroidism: Secondary | ICD-10-CM | POA: Diagnosis not present

## 2018-07-26 ENCOUNTER — Ambulatory Visit
Admission: RE | Admit: 2018-07-26 | Discharge: 2018-07-26 | Disposition: A | Discharge status: Home / Self Care | Payer: Medicare HMO | Source: Ambulatory Visit | Attending: Internal Medicine | Admitting: Internal Medicine

## 2018-07-26 DIAGNOSIS — Z1231 Encounter for screening mammogram for malignant neoplasm of breast: Secondary | ICD-10-CM

## 2018-07-30 DIAGNOSIS — M109 Gout, unspecified: Secondary | ICD-10-CM | POA: Diagnosis not present

## 2018-07-30 DIAGNOSIS — I10 Essential (primary) hypertension: Secondary | ICD-10-CM | POA: Diagnosis not present

## 2018-07-30 DIAGNOSIS — Z0000 Encounter for general adult medical examination without abnormal findings: Secondary | ICD-10-CM | POA: Diagnosis not present

## 2018-07-30 DIAGNOSIS — I4510 Unspecified right bundle-branch block: Secondary | ICD-10-CM | POA: Diagnosis not present

## 2018-07-30 DIAGNOSIS — D696 Thrombocytopenia, unspecified: Secondary | ICD-10-CM | POA: Diagnosis not present

## 2018-07-30 DIAGNOSIS — R82998 Other abnormal findings in urine: Secondary | ICD-10-CM | POA: Diagnosis not present

## 2018-07-30 DIAGNOSIS — E7849 Other hyperlipidemia: Secondary | ICD-10-CM | POA: Diagnosis not present

## 2018-07-30 DIAGNOSIS — R7301 Impaired fasting glucose: Secondary | ICD-10-CM | POA: Diagnosis not present

## 2018-07-30 DIAGNOSIS — E038 Other specified hypothyroidism: Secondary | ICD-10-CM | POA: Diagnosis not present

## 2018-07-30 DIAGNOSIS — Z1212 Encounter for screening for malignant neoplasm of rectum: Secondary | ICD-10-CM | POA: Diagnosis not present

## 2018-07-30 DIAGNOSIS — N183 Chronic kidney disease, stage 3 (moderate): Secondary | ICD-10-CM | POA: Diagnosis not present

## 2019-02-08 DIAGNOSIS — N183 Chronic kidney disease, stage 3 (moderate): Secondary | ICD-10-CM | POA: Diagnosis not present

## 2019-02-08 DIAGNOSIS — I129 Hypertensive chronic kidney disease with stage 1 through stage 4 chronic kidney disease, or unspecified chronic kidney disease: Secondary | ICD-10-CM | POA: Diagnosis not present

## 2019-02-08 DIAGNOSIS — R7301 Impaired fasting glucose: Secondary | ICD-10-CM | POA: Diagnosis not present

## 2019-02-08 DIAGNOSIS — D696 Thrombocytopenia, unspecified: Secondary | ICD-10-CM | POA: Diagnosis not present

## 2019-02-08 DIAGNOSIS — E039 Hypothyroidism, unspecified: Secondary | ICD-10-CM | POA: Diagnosis not present

## 2019-02-10 DIAGNOSIS — N183 Chronic kidney disease, stage 3 (moderate): Secondary | ICD-10-CM | POA: Diagnosis not present

## 2019-04-28 DIAGNOSIS — Z23 Encounter for immunization: Secondary | ICD-10-CM | POA: Diagnosis not present

## 2019-07-13 DIAGNOSIS — L508 Other urticaria: Secondary | ICD-10-CM | POA: Diagnosis not present

## 2019-07-13 DIAGNOSIS — N1831 Chronic kidney disease, stage 3a: Secondary | ICD-10-CM | POA: Diagnosis not present

## 2019-07-13 DIAGNOSIS — I129 Hypertensive chronic kidney disease with stage 1 through stage 4 chronic kidney disease, or unspecified chronic kidney disease: Secondary | ICD-10-CM | POA: Diagnosis not present

## 2019-08-11 DIAGNOSIS — M810 Age-related osteoporosis without current pathological fracture: Secondary | ICD-10-CM | POA: Diagnosis not present

## 2019-08-11 DIAGNOSIS — M109 Gout, unspecified: Secondary | ICD-10-CM | POA: Diagnosis not present

## 2019-08-11 DIAGNOSIS — R7301 Impaired fasting glucose: Secondary | ICD-10-CM | POA: Diagnosis not present

## 2019-08-11 DIAGNOSIS — Z0000 Encounter for general adult medical examination without abnormal findings: Secondary | ICD-10-CM | POA: Diagnosis not present

## 2019-08-11 DIAGNOSIS — E7849 Other hyperlipidemia: Secondary | ICD-10-CM | POA: Diagnosis not present

## 2019-08-11 DIAGNOSIS — E038 Other specified hypothyroidism: Secondary | ICD-10-CM | POA: Diagnosis not present

## 2019-08-18 DIAGNOSIS — R7301 Impaired fasting glucose: Secondary | ICD-10-CM | POA: Diagnosis not present

## 2019-08-18 DIAGNOSIS — D696 Thrombocytopenia, unspecified: Secondary | ICD-10-CM | POA: Diagnosis not present

## 2019-08-18 DIAGNOSIS — I4510 Unspecified right bundle-branch block: Secondary | ICD-10-CM | POA: Diagnosis not present

## 2019-08-18 DIAGNOSIS — M810 Age-related osteoporosis without current pathological fracture: Secondary | ICD-10-CM | POA: Diagnosis not present

## 2019-08-18 DIAGNOSIS — N1831 Chronic kidney disease, stage 3a: Secondary | ICD-10-CM | POA: Diagnosis not present

## 2019-08-18 DIAGNOSIS — E785 Hyperlipidemia, unspecified: Secondary | ICD-10-CM | POA: Diagnosis not present

## 2019-08-18 DIAGNOSIS — Z1331 Encounter for screening for depression: Secondary | ICD-10-CM | POA: Diagnosis not present

## 2019-08-18 DIAGNOSIS — Z0000 Encounter for general adult medical examination without abnormal findings: Secondary | ICD-10-CM | POA: Diagnosis not present

## 2019-08-18 DIAGNOSIS — M109 Gout, unspecified: Secondary | ICD-10-CM | POA: Diagnosis not present

## 2019-08-18 DIAGNOSIS — I129 Hypertensive chronic kidney disease with stage 1 through stage 4 chronic kidney disease, or unspecified chronic kidney disease: Secondary | ICD-10-CM | POA: Diagnosis not present

## 2019-08-31 DIAGNOSIS — Z1212 Encounter for screening for malignant neoplasm of rectum: Secondary | ICD-10-CM | POA: Diagnosis not present

## 2019-08-31 DIAGNOSIS — R82998 Other abnormal findings in urine: Secondary | ICD-10-CM | POA: Diagnosis not present

## 2019-09-05 DIAGNOSIS — M810 Age-related osteoporosis without current pathological fracture: Secondary | ICD-10-CM | POA: Diagnosis not present

## 2019-11-30 IMAGING — MG DIGITAL SCREENING UNILATERAL LEFT MAMMOGRAM WITH CAD AND TOMO
4 series · 4 of 12 positions shown · non-contrast
Comparison: Previous exam(s).

CLINICAL DATA: Screening.

EXAM:
DIGITAL SCREENING UNILATERAL LEFT MAMMOGRAM WITH CAD AND TOMO

[L CC synth-2D]
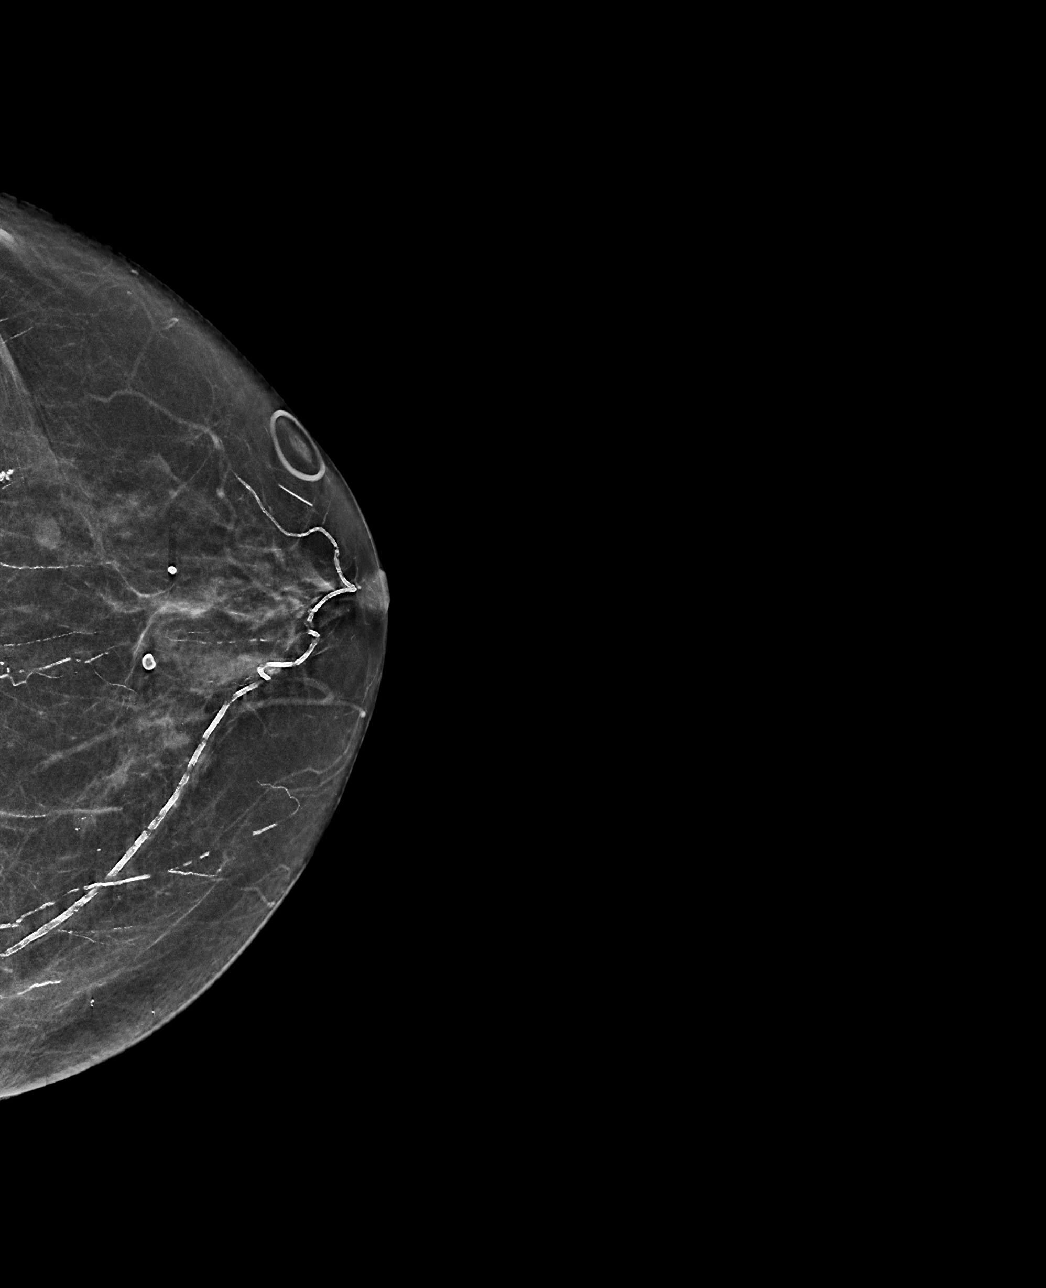

[L MLO synth-2D]
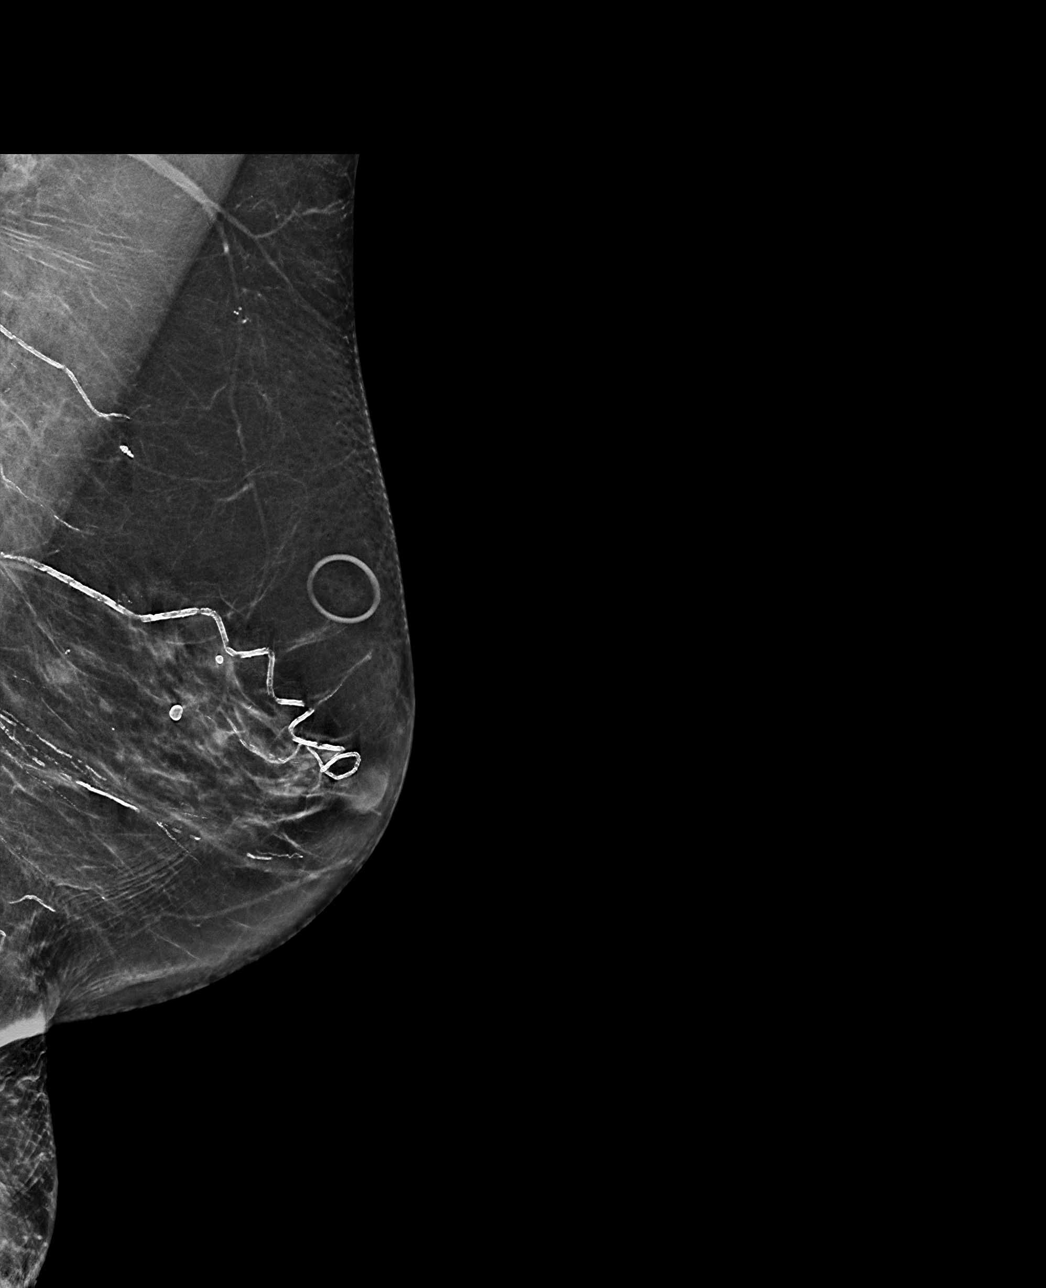

[L MLO tomo · tomo slice 32/63.0]
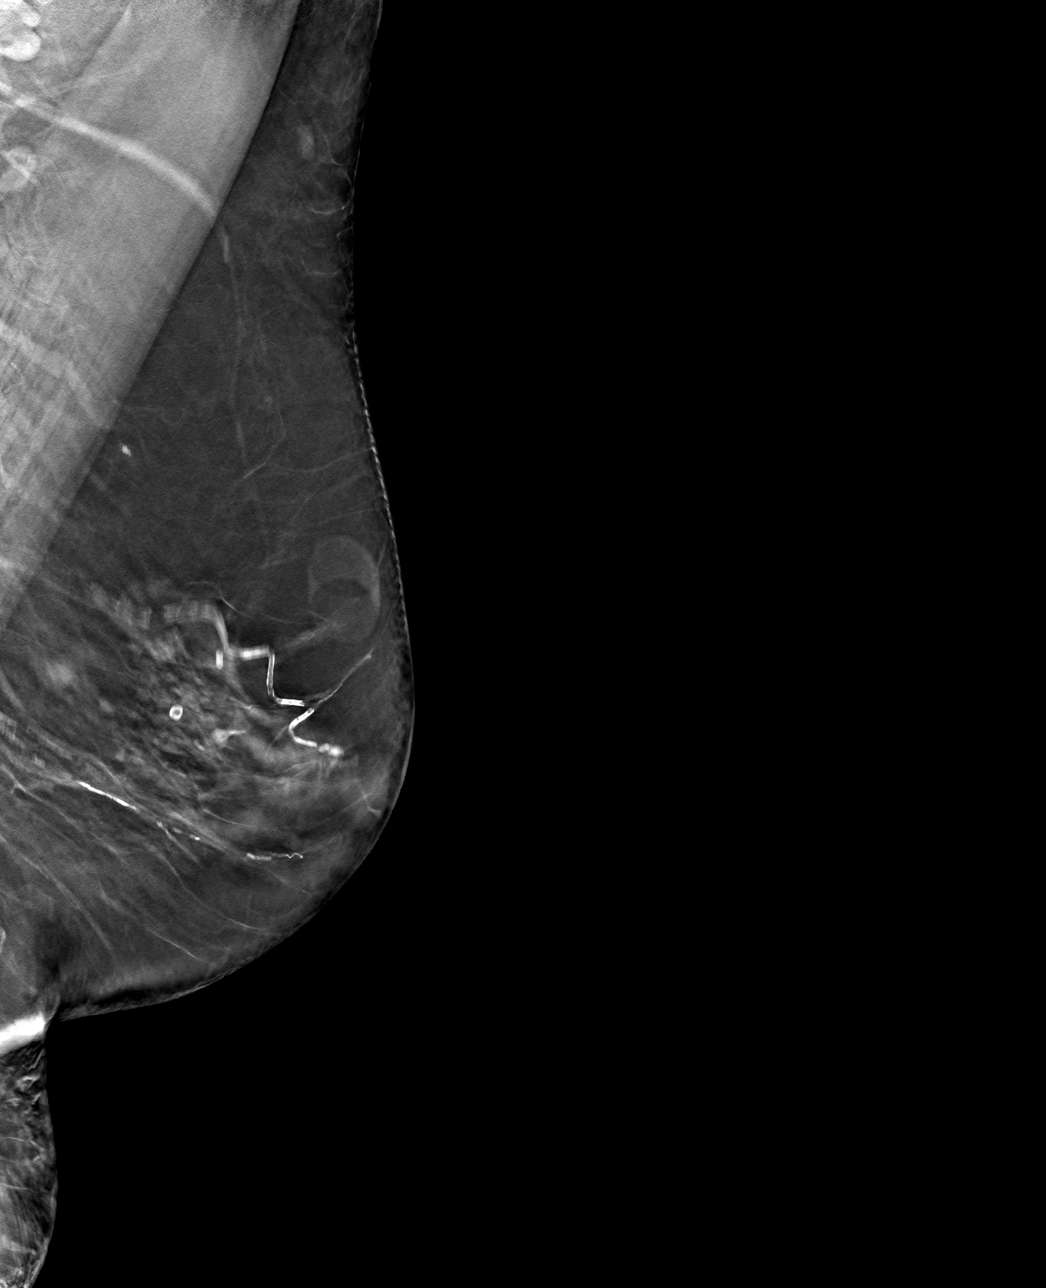

[L CC tomo · tomo slice 29/56.0]
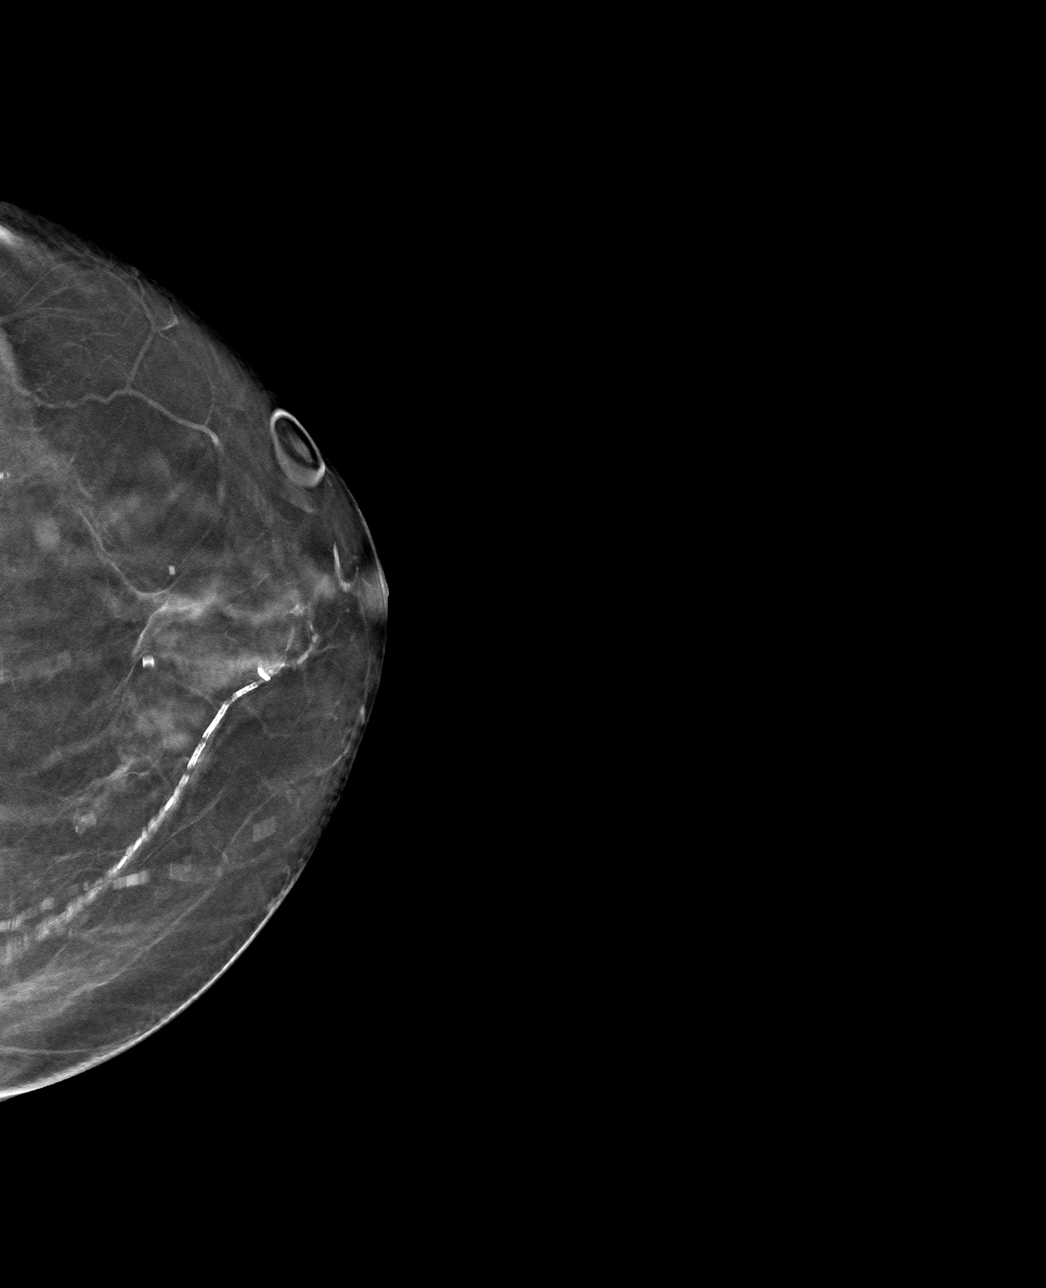

[4 of 12 positions shown; findings below may reference images not displayed]

ACR Breast Density Category b: There are scattered areas of
fibroglandular density.
FINDINGS: There are no findings suspicious for malignancy. Images were
processed with CAD.
IMPRESSION: No mammographic evidence of malignancy. A result letter of this
screening mammogram will be mailed directly to the patient.

RECOMMENDATION:
Screening mammogram in one year. (Code:51-Y-CJA)

BI-RADS CATEGORY  1: Negative.

## 2020-01-16 DIAGNOSIS — L508 Other urticaria: Secondary | ICD-10-CM | POA: Diagnosis not present

## 2020-01-16 DIAGNOSIS — M109 Gout, unspecified: Secondary | ICD-10-CM | POA: Diagnosis not present

## 2020-01-16 DIAGNOSIS — N1831 Chronic kidney disease, stage 3a: Secondary | ICD-10-CM | POA: Diagnosis not present

## 2020-01-16 DIAGNOSIS — I129 Hypertensive chronic kidney disease with stage 1 through stage 4 chronic kidney disease, or unspecified chronic kidney disease: Secondary | ICD-10-CM | POA: Diagnosis not present

## 2020-01-16 DIAGNOSIS — E039 Hypothyroidism, unspecified: Secondary | ICD-10-CM | POA: Diagnosis not present

## 2020-01-16 DIAGNOSIS — E785 Hyperlipidemia, unspecified: Secondary | ICD-10-CM | POA: Diagnosis not present

## 2020-03-26 DIAGNOSIS — I129 Hypertensive chronic kidney disease with stage 1 through stage 4 chronic kidney disease, or unspecified chronic kidney disease: Secondary | ICD-10-CM | POA: Diagnosis not present

## 2020-03-26 DIAGNOSIS — S81812A Laceration without foreign body, left lower leg, initial encounter: Secondary | ICD-10-CM | POA: Diagnosis not present

## 2020-03-26 DIAGNOSIS — F418 Other specified anxiety disorders: Secondary | ICD-10-CM | POA: Diagnosis not present

## 2020-03-26 DIAGNOSIS — N1831 Chronic kidney disease, stage 3a: Secondary | ICD-10-CM | POA: Diagnosis not present

## 2020-03-26 DIAGNOSIS — E785 Hyperlipidemia, unspecified: Secondary | ICD-10-CM | POA: Diagnosis not present

## 2020-04-20 DIAGNOSIS — S81812A Laceration without foreign body, left lower leg, initial encounter: Secondary | ICD-10-CM | POA: Diagnosis not present

## 2020-04-20 DIAGNOSIS — I129 Hypertensive chronic kidney disease with stage 1 through stage 4 chronic kidney disease, or unspecified chronic kidney disease: Secondary | ICD-10-CM | POA: Diagnosis not present

## 2020-04-20 DIAGNOSIS — N1831 Chronic kidney disease, stage 3a: Secondary | ICD-10-CM | POA: Diagnosis not present

## 2020-05-17 DIAGNOSIS — I10 Essential (primary) hypertension: Secondary | ICD-10-CM | POA: Diagnosis not present

## 2020-06-20 DIAGNOSIS — S81812A Laceration without foreign body, left lower leg, initial encounter: Secondary | ICD-10-CM | POA: Diagnosis not present

## 2020-06-20 DIAGNOSIS — N1831 Chronic kidney disease, stage 3a: Secondary | ICD-10-CM | POA: Diagnosis not present

## 2020-06-20 DIAGNOSIS — I129 Hypertensive chronic kidney disease with stage 1 through stage 4 chronic kidney disease, or unspecified chronic kidney disease: Secondary | ICD-10-CM | POA: Diagnosis not present

## 2020-07-13 DIAGNOSIS — Z20822 Contact with and (suspected) exposure to covid-19: Secondary | ICD-10-CM | POA: Diagnosis not present

## 2020-07-16 DIAGNOSIS — Z1152 Encounter for screening for COVID-19: Secondary | ICD-10-CM | POA: Diagnosis not present

## 2020-07-16 DIAGNOSIS — N1831 Chronic kidney disease, stage 3a: Secondary | ICD-10-CM | POA: Diagnosis not present

## 2020-07-16 DIAGNOSIS — K219 Gastro-esophageal reflux disease without esophagitis: Secondary | ICD-10-CM | POA: Diagnosis not present

## 2020-07-16 DIAGNOSIS — J189 Pneumonia, unspecified organism: Secondary | ICD-10-CM | POA: Diagnosis not present

## 2020-07-16 DIAGNOSIS — R059 Cough, unspecified: Secondary | ICD-10-CM | POA: Diagnosis not present

## 2020-07-16 DIAGNOSIS — I129 Hypertensive chronic kidney disease with stage 1 through stage 4 chronic kidney disease, or unspecified chronic kidney disease: Secondary | ICD-10-CM | POA: Diagnosis not present

## 2020-08-23 DIAGNOSIS — E039 Hypothyroidism, unspecified: Secondary | ICD-10-CM | POA: Diagnosis not present

## 2020-08-23 DIAGNOSIS — M810 Age-related osteoporosis without current pathological fracture: Secondary | ICD-10-CM | POA: Diagnosis not present

## 2020-08-23 DIAGNOSIS — E785 Hyperlipidemia, unspecified: Secondary | ICD-10-CM | POA: Diagnosis not present

## 2020-08-30 DIAGNOSIS — E039 Hypothyroidism, unspecified: Secondary | ICD-10-CM | POA: Diagnosis not present

## 2020-08-30 DIAGNOSIS — I4510 Unspecified right bundle-branch block: Secondary | ICD-10-CM | POA: Diagnosis not present

## 2020-08-30 DIAGNOSIS — K219 Gastro-esophageal reflux disease without esophagitis: Secondary | ICD-10-CM | POA: Diagnosis not present

## 2020-08-30 DIAGNOSIS — E785 Hyperlipidemia, unspecified: Secondary | ICD-10-CM | POA: Diagnosis not present

## 2020-08-30 DIAGNOSIS — D696 Thrombocytopenia, unspecified: Secondary | ICD-10-CM | POA: Diagnosis not present

## 2020-08-30 DIAGNOSIS — Z0000 Encounter for general adult medical examination without abnormal findings: Secondary | ICD-10-CM | POA: Diagnosis not present

## 2020-08-30 DIAGNOSIS — R82998 Other abnormal findings in urine: Secondary | ICD-10-CM | POA: Diagnosis not present

## 2020-08-30 DIAGNOSIS — N1831 Chronic kidney disease, stage 3a: Secondary | ICD-10-CM | POA: Diagnosis not present

## 2020-08-30 DIAGNOSIS — I129 Hypertensive chronic kidney disease with stage 1 through stage 4 chronic kidney disease, or unspecified chronic kidney disease: Secondary | ICD-10-CM | POA: Diagnosis not present

## 2020-08-30 DIAGNOSIS — M810 Age-related osteoporosis without current pathological fracture: Secondary | ICD-10-CM | POA: Diagnosis not present

## 2020-09-03 DIAGNOSIS — Z1212 Encounter for screening for malignant neoplasm of rectum: Secondary | ICD-10-CM | POA: Diagnosis not present

## 2020-11-15 DIAGNOSIS — M25562 Pain in left knee: Secondary | ICD-10-CM | POA: Diagnosis not present

## 2020-11-23 DIAGNOSIS — H25813 Combined forms of age-related cataract, bilateral: Secondary | ICD-10-CM | POA: Diagnosis not present

## 2020-12-21 DIAGNOSIS — H25811 Combined forms of age-related cataract, right eye: Secondary | ICD-10-CM | POA: Diagnosis not present

## 2020-12-21 DIAGNOSIS — H25812 Combined forms of age-related cataract, left eye: Secondary | ICD-10-CM | POA: Diagnosis not present

## 2020-12-24 DIAGNOSIS — H25812 Combined forms of age-related cataract, left eye: Secondary | ICD-10-CM | POA: Diagnosis not present

## 2020-12-24 DIAGNOSIS — Z01818 Encounter for other preprocedural examination: Secondary | ICD-10-CM | POA: Diagnosis not present

## 2020-12-24 DIAGNOSIS — H25811 Combined forms of age-related cataract, right eye: Secondary | ICD-10-CM | POA: Diagnosis not present

## 2020-12-28 DIAGNOSIS — H25812 Combined forms of age-related cataract, left eye: Secondary | ICD-10-CM | POA: Diagnosis not present

## 2020-12-28 DIAGNOSIS — H2512 Age-related nuclear cataract, left eye: Secondary | ICD-10-CM | POA: Diagnosis not present

## 2021-01-25 DIAGNOSIS — H25811 Combined forms of age-related cataract, right eye: Secondary | ICD-10-CM | POA: Diagnosis not present

## 2021-01-25 DIAGNOSIS — H2511 Age-related nuclear cataract, right eye: Secondary | ICD-10-CM | POA: Diagnosis not present

## 2021-03-29 DIAGNOSIS — I129 Hypertensive chronic kidney disease with stage 1 through stage 4 chronic kidney disease, or unspecified chronic kidney disease: Secondary | ICD-10-CM | POA: Diagnosis not present

## 2021-03-29 DIAGNOSIS — M79674 Pain in right toe(s): Secondary | ICD-10-CM | POA: Diagnosis not present

## 2021-03-29 DIAGNOSIS — M109 Gout, unspecified: Secondary | ICD-10-CM | POA: Diagnosis not present

## 2021-06-07 DIAGNOSIS — J019 Acute sinusitis, unspecified: Secondary | ICD-10-CM | POA: Diagnosis not present

## 2021-06-07 DIAGNOSIS — R5383 Other fatigue: Secondary | ICD-10-CM | POA: Diagnosis not present

## 2021-06-07 DIAGNOSIS — R519 Headache, unspecified: Secondary | ICD-10-CM | POA: Diagnosis not present

## 2021-06-07 DIAGNOSIS — R490 Dysphonia: Secondary | ICD-10-CM | POA: Diagnosis not present

## 2021-06-07 DIAGNOSIS — N1831 Chronic kidney disease, stage 3a: Secondary | ICD-10-CM | POA: Diagnosis not present

## 2021-06-07 DIAGNOSIS — I129 Hypertensive chronic kidney disease with stage 1 through stage 4 chronic kidney disease, or unspecified chronic kidney disease: Secondary | ICD-10-CM | POA: Diagnosis not present

## 2021-06-07 DIAGNOSIS — R0981 Nasal congestion: Secondary | ICD-10-CM | POA: Diagnosis not present

## 2021-06-07 DIAGNOSIS — Z1152 Encounter for screening for COVID-19: Secondary | ICD-10-CM | POA: Diagnosis not present

## 2021-09-26 DIAGNOSIS — E785 Hyperlipidemia, unspecified: Secondary | ICD-10-CM | POA: Diagnosis not present

## 2021-09-26 DIAGNOSIS — E039 Hypothyroidism, unspecified: Secondary | ICD-10-CM | POA: Diagnosis not present

## 2021-09-26 DIAGNOSIS — M810 Age-related osteoporosis without current pathological fracture: Secondary | ICD-10-CM | POA: Diagnosis not present

## 2021-09-26 DIAGNOSIS — R7301 Impaired fasting glucose: Secondary | ICD-10-CM | POA: Diagnosis not present

## 2021-09-26 DIAGNOSIS — I10 Essential (primary) hypertension: Secondary | ICD-10-CM | POA: Diagnosis not present

## 2021-09-26 DIAGNOSIS — M109 Gout, unspecified: Secondary | ICD-10-CM | POA: Diagnosis not present

## 2021-10-03 DIAGNOSIS — M810 Age-related osteoporosis without current pathological fracture: Secondary | ICD-10-CM | POA: Diagnosis not present

## 2021-10-03 DIAGNOSIS — R82998 Other abnormal findings in urine: Secondary | ICD-10-CM | POA: Diagnosis not present

## 2021-10-03 DIAGNOSIS — E785 Hyperlipidemia, unspecified: Secondary | ICD-10-CM | POA: Diagnosis not present

## 2021-10-03 DIAGNOSIS — E039 Hypothyroidism, unspecified: Secondary | ICD-10-CM | POA: Diagnosis not present

## 2021-10-03 DIAGNOSIS — I4510 Unspecified right bundle-branch block: Secondary | ICD-10-CM | POA: Diagnosis not present

## 2021-10-03 DIAGNOSIS — I10 Essential (primary) hypertension: Secondary | ICD-10-CM | POA: Diagnosis not present

## 2021-10-03 DIAGNOSIS — Z0000 Encounter for general adult medical examination without abnormal findings: Secondary | ICD-10-CM | POA: Diagnosis not present

## 2021-10-03 DIAGNOSIS — D696 Thrombocytopenia, unspecified: Secondary | ICD-10-CM | POA: Diagnosis not present

## 2021-10-03 DIAGNOSIS — I129 Hypertensive chronic kidney disease with stage 1 through stage 4 chronic kidney disease, or unspecified chronic kidney disease: Secondary | ICD-10-CM | POA: Diagnosis not present

## 2021-10-03 DIAGNOSIS — N1831 Chronic kidney disease, stage 3a: Secondary | ICD-10-CM | POA: Diagnosis not present

## 2021-10-03 DIAGNOSIS — M109 Gout, unspecified: Secondary | ICD-10-CM | POA: Diagnosis not present

## 2022-02-07 DIAGNOSIS — N1831 Chronic kidney disease, stage 3a: Secondary | ICD-10-CM | POA: Diagnosis not present

## 2022-02-07 DIAGNOSIS — I129 Hypertensive chronic kidney disease with stage 1 through stage 4 chronic kidney disease, or unspecified chronic kidney disease: Secondary | ICD-10-CM | POA: Diagnosis not present

## 2022-02-07 DIAGNOSIS — D696 Thrombocytopenia, unspecified: Secondary | ICD-10-CM | POA: Diagnosis not present

## 2022-02-07 DIAGNOSIS — R7301 Impaired fasting glucose: Secondary | ICD-10-CM | POA: Diagnosis not present

## 2022-02-07 DIAGNOSIS — E039 Hypothyroidism, unspecified: Secondary | ICD-10-CM | POA: Diagnosis not present

## 2022-02-07 DIAGNOSIS — M810 Age-related osteoporosis without current pathological fracture: Secondary | ICD-10-CM | POA: Diagnosis not present

## 2022-02-07 DIAGNOSIS — R195 Other fecal abnormalities: Secondary | ICD-10-CM | POA: Diagnosis not present

## 2022-02-07 DIAGNOSIS — N823 Fistula of vagina to large intestine: Secondary | ICD-10-CM | POA: Diagnosis not present

## 2022-02-07 DIAGNOSIS — E785 Hyperlipidemia, unspecified: Secondary | ICD-10-CM | POA: Diagnosis not present

## 2022-09-05 DIAGNOSIS — I129 Hypertensive chronic kidney disease with stage 1 through stage 4 chronic kidney disease, or unspecified chronic kidney disease: Secondary | ICD-10-CM | POA: Diagnosis not present

## 2022-09-05 DIAGNOSIS — I87332 Chronic venous hypertension (idiopathic) with ulcer and inflammation of left lower extremity: Secondary | ICD-10-CM | POA: Diagnosis not present

## 2022-09-05 DIAGNOSIS — L03116 Cellulitis of left lower limb: Secondary | ICD-10-CM | POA: Diagnosis not present

## 2022-09-10 DIAGNOSIS — I87332 Chronic venous hypertension (idiopathic) with ulcer and inflammation of left lower extremity: Secondary | ICD-10-CM | POA: Diagnosis not present

## 2022-09-10 DIAGNOSIS — I129 Hypertensive chronic kidney disease with stage 1 through stage 4 chronic kidney disease, or unspecified chronic kidney disease: Secondary | ICD-10-CM | POA: Diagnosis not present

## 2022-09-10 DIAGNOSIS — L03116 Cellulitis of left lower limb: Secondary | ICD-10-CM | POA: Diagnosis not present

## 2022-09-17 DIAGNOSIS — L03116 Cellulitis of left lower limb: Secondary | ICD-10-CM | POA: Diagnosis not present

## 2022-09-17 DIAGNOSIS — I87332 Chronic venous hypertension (idiopathic) with ulcer and inflammation of left lower extremity: Secondary | ICD-10-CM | POA: Diagnosis not present

## 2022-09-17 DIAGNOSIS — I129 Hypertensive chronic kidney disease with stage 1 through stage 4 chronic kidney disease, or unspecified chronic kidney disease: Secondary | ICD-10-CM | POA: Diagnosis not present

## 2022-09-24 DIAGNOSIS — L03116 Cellulitis of left lower limb: Secondary | ICD-10-CM | POA: Diagnosis not present

## 2022-09-24 DIAGNOSIS — I87332 Chronic venous hypertension (idiopathic) with ulcer and inflammation of left lower extremity: Secondary | ICD-10-CM | POA: Diagnosis not present

## 2022-09-24 DIAGNOSIS — I129 Hypertensive chronic kidney disease with stage 1 through stage 4 chronic kidney disease, or unspecified chronic kidney disease: Secondary | ICD-10-CM | POA: Diagnosis not present

## 2022-10-02 DIAGNOSIS — L03116 Cellulitis of left lower limb: Secondary | ICD-10-CM | POA: Diagnosis not present

## 2022-10-02 DIAGNOSIS — E785 Hyperlipidemia, unspecified: Secondary | ICD-10-CM | POA: Diagnosis not present

## 2022-10-02 DIAGNOSIS — Z008 Encounter for other general examination: Secondary | ICD-10-CM | POA: Diagnosis not present

## 2022-10-02 DIAGNOSIS — F419 Anxiety disorder, unspecified: Secondary | ICD-10-CM | POA: Diagnosis not present

## 2022-10-02 DIAGNOSIS — K219 Gastro-esophageal reflux disease without esophagitis: Secondary | ICD-10-CM | POA: Diagnosis not present

## 2022-10-02 DIAGNOSIS — I87332 Chronic venous hypertension (idiopathic) with ulcer and inflammation of left lower extremity: Secondary | ICD-10-CM | POA: Diagnosis not present

## 2022-10-02 DIAGNOSIS — R7301 Impaired fasting glucose: Secondary | ICD-10-CM | POA: Diagnosis not present

## 2022-10-02 DIAGNOSIS — I129 Hypertensive chronic kidney disease with stage 1 through stage 4 chronic kidney disease, or unspecified chronic kidney disease: Secondary | ICD-10-CM | POA: Diagnosis not present

## 2022-10-02 DIAGNOSIS — E039 Hypothyroidism, unspecified: Secondary | ICD-10-CM | POA: Diagnosis not present

## 2022-10-02 DIAGNOSIS — M810 Age-related osteoporosis without current pathological fracture: Secondary | ICD-10-CM | POA: Diagnosis not present

## 2022-10-13 DIAGNOSIS — Z1331 Encounter for screening for depression: Secondary | ICD-10-CM | POA: Diagnosis not present

## 2022-10-13 DIAGNOSIS — E785 Hyperlipidemia, unspecified: Secondary | ICD-10-CM | POA: Diagnosis not present

## 2022-10-13 DIAGNOSIS — E039 Hypothyroidism, unspecified: Secondary | ICD-10-CM | POA: Diagnosis not present

## 2022-10-13 DIAGNOSIS — Z853 Personal history of malignant neoplasm of breast: Secondary | ICD-10-CM | POA: Diagnosis not present

## 2022-10-13 DIAGNOSIS — I129 Hypertensive chronic kidney disease with stage 1 through stage 4 chronic kidney disease, or unspecified chronic kidney disease: Secondary | ICD-10-CM | POA: Diagnosis not present

## 2022-10-13 DIAGNOSIS — K219 Gastro-esophageal reflux disease without esophagitis: Secondary | ICD-10-CM | POA: Diagnosis not present

## 2022-10-13 DIAGNOSIS — I87332 Chronic venous hypertension (idiopathic) with ulcer and inflammation of left lower extremity: Secondary | ICD-10-CM | POA: Diagnosis not present

## 2022-10-13 DIAGNOSIS — Z0000 Encounter for general adult medical examination without abnormal findings: Secondary | ICD-10-CM | POA: Diagnosis not present

## 2022-10-13 DIAGNOSIS — R7301 Impaired fasting glucose: Secondary | ICD-10-CM | POA: Diagnosis not present

## 2022-10-13 DIAGNOSIS — Z23 Encounter for immunization: Secondary | ICD-10-CM | POA: Diagnosis not present

## 2022-10-13 DIAGNOSIS — N1831 Chronic kidney disease, stage 3a: Secondary | ICD-10-CM | POA: Diagnosis not present

## 2022-10-13 DIAGNOSIS — Z1339 Encounter for screening examination for other mental health and behavioral disorders: Secondary | ICD-10-CM | POA: Diagnosis not present

## 2022-10-13 DIAGNOSIS — D696 Thrombocytopenia, unspecified: Secondary | ICD-10-CM | POA: Diagnosis not present

## 2022-10-17 DIAGNOSIS — I87332 Chronic venous hypertension (idiopathic) with ulcer and inflammation of left lower extremity: Secondary | ICD-10-CM | POA: Diagnosis not present

## 2022-11-03 DIAGNOSIS — I87332 Chronic venous hypertension (idiopathic) with ulcer and inflammation of left lower extremity: Secondary | ICD-10-CM | POA: Diagnosis not present

## 2022-11-03 DIAGNOSIS — L03116 Cellulitis of left lower limb: Secondary | ICD-10-CM | POA: Diagnosis not present

## 2022-11-06 DIAGNOSIS — I87332 Chronic venous hypertension (idiopathic) with ulcer and inflammation of left lower extremity: Secondary | ICD-10-CM | POA: Diagnosis not present

## 2022-11-06 DIAGNOSIS — L03116 Cellulitis of left lower limb: Secondary | ICD-10-CM | POA: Diagnosis not present

## 2022-11-06 DIAGNOSIS — I129 Hypertensive chronic kidney disease with stage 1 through stage 4 chronic kidney disease, or unspecified chronic kidney disease: Secondary | ICD-10-CM | POA: Diagnosis not present

## 2022-11-13 DIAGNOSIS — I87332 Chronic venous hypertension (idiopathic) with ulcer and inflammation of left lower extremity: Secondary | ICD-10-CM | POA: Diagnosis not present

## 2022-11-13 DIAGNOSIS — L03116 Cellulitis of left lower limb: Secondary | ICD-10-CM | POA: Diagnosis not present

## 2022-11-13 DIAGNOSIS — I129 Hypertensive chronic kidney disease with stage 1 through stage 4 chronic kidney disease, or unspecified chronic kidney disease: Secondary | ICD-10-CM | POA: Diagnosis not present

## 2022-11-20 DIAGNOSIS — L03116 Cellulitis of left lower limb: Secondary | ICD-10-CM | POA: Diagnosis not present

## 2022-11-20 DIAGNOSIS — I87332 Chronic venous hypertension (idiopathic) with ulcer and inflammation of left lower extremity: Secondary | ICD-10-CM | POA: Diagnosis not present

## 2022-11-20 DIAGNOSIS — I129 Hypertensive chronic kidney disease with stage 1 through stage 4 chronic kidney disease, or unspecified chronic kidney disease: Secondary | ICD-10-CM | POA: Diagnosis not present

## 2022-11-26 ENCOUNTER — Encounter (HOSPITAL_BASED_OUTPATIENT_CLINIC_OR_DEPARTMENT_OTHER): Payer: Medicare HMO | Attending: General Surgery | Admitting: General Surgery

## 2022-11-26 DIAGNOSIS — I10 Essential (primary) hypertension: Secondary | ICD-10-CM | POA: Diagnosis not present

## 2022-11-26 DIAGNOSIS — I87332 Chronic venous hypertension (idiopathic) with ulcer and inflammation of left lower extremity: Secondary | ICD-10-CM | POA: Insufficient documentation

## 2022-11-26 DIAGNOSIS — L97822 Non-pressure chronic ulcer of other part of left lower leg with fat layer exposed: Secondary | ICD-10-CM | POA: Diagnosis not present

## 2022-11-26 DIAGNOSIS — I872 Venous insufficiency (chronic) (peripheral): Secondary | ICD-10-CM | POA: Diagnosis not present

## 2022-11-26 NOTE — Progress Notes (Signed)
Erin [name] ([id number]) [id number] Page 1 of 4 Visit Report for 11/26/2022 Abuse Risk Screen Details Patient Name: Date of Service: [name], [name] RBA RA N. 11/26/2022 1:30 PM Medical Record Number: [id number] Patient Account Number: [id number] Date of Birth/Sex: Treating RN: [birth year] (82 y.o. Erin [name] Primary Care Erin [name]: Erin [name] Other Clinician: Referring Erin [name]: Treating Erin [name]/Extender: Erin [name] in Treatment: 0 Abuse Risk Screen Items Answer ABUSE RISK SCREEN: Has anyone close to you tried to hurt or harm you recentlyo No Do you feel uncomfortable with anyone in your familyo No Has anyone forced you do things that you didnt want to doo No Electronic Signature(s) Signed: 11/26/2022 3:42:40 PM By: Erin [name] Entered By: Erin [name] on 11/26/2022 13:30:14 -------------------------------------------------------------------------------- Activities of Daily Living Details Patient Name: Date of Service: [name], [name] 11/26/2022 1:30 PM Medical Record Number: [id number] Patient Account Number: [id number] Date of Birth/Sex: Treating RN: 1941/01/12 (82 y.o. Erin [name] Primary Care Erin [name]: Erin [name] Other Clinician: Referring Erin [name]: Treating Erin [name]/Extender: Erin [name] in Treatment: 0 Activities of Daily Living Items Answer Activities of Daily Living (Please select one for each item) Drive Automobile Completely Able T Medications ake Completely Able Use T elephone Completely Able Care for Appearance Completely Able Use T oilet Completely Able Bath / Shower Completely Able Dress Self Completely Able Feed Self Completely Able Walk Completely Able Get In / Out Bed Completely Able Housework Completely Able Prepare Meals Completely Able Handle Money Completely Able Shop for Self Completely  Able Electronic Signature(s) Signed: 11/26/2022 3:42:40 PM By: Erin [name] Entered By: Erin [name] on 11/26/2022 13:30:34 -------------------------------------------------------------------------------- Education Screening Details Patient Name: Date of Service: [name], [name] 11/26/2022 1:30 PM Medical Record Number: [id number] Patient Account Number: [id number] Date of Birth/Sex: Treating RN: [birth year] (82 y.o. Erin [name] Primary Care Erin [name]: Erin [name] Other Clinician: Referring Erin [name]: Treating Erin [name]/Extender: Erin [name] in Treatment: [address] Erin [name], Erin [name] ([id number]) [id number] Page 2 of 4 Primary Learner Assessed: Patient Learning Preferences/Education Level/Primary Language Learning Preference: Explanation, Demonstration, Video, Printed Material Highest Education Level: High School Preferred Language: Economist Language Barrier: No Translator Needed: No Memory Deficit: No Emotional Barrier: No Cultural/Religious Beliefs Affecting Medical Care: No Physical Barrier Impaired Vision: No Impaired Hearing: No Decreased Hand dexterity: No Knowledge/Comprehension Knowledge Level: Medium Comprehension Level: Medium Ability to understand written instructions: Medium Ability to understand verbal instructions: Medium Motivation Anxiety Level: Calm Cooperation: Cooperative Education Importance: Acknowledges Need Interest in Health Problems: Asks Questions Perception: Coherent Willingness to Engage in Self-Management Medium Activities: Readiness to Engage in Self-Management Medium Activities: Electronic Signature(s) Signed: 11/26/2022 3:42:40 PM By: Erin [name] Entered By: Erin [name] on 11/26/2022 13:30:53 -------------------------------------------------------------------------------- Fall Risk Assessment Details Patient Name: Date of  Service: [name], [name] 11/26/2022 1:30 PM Medical Record Number: [id number] Patient Account Number: [id number] Date of Birth/Sex: Treating RN: 04-12-1941 (82 y.o. Erin [name] Primary Care Erin [name]: Erin [name] Other Clinician: Referring Erin [name]: Treating Erin [name]/Extender: Erin [name] in Treatment: 0 Fall Risk Assessment Items Have you had 2 or more falls in the last 12 monthso 0 No Have you had any fall that resulted in injury in the last 12 monthso 0 No FALLS RISK SCREEN History of falling - immediate or within 3 months 0 No Secondary diagnosis (Do you have 2 or more medical diagnoseso) 0 No Ambulatory aid  None/bed rest/wheelchair/nurse 0 Yes Crutches/cane/walker 0 No Furniture 0 No Intravenous therapy Access/Saline/Heparin Lock 0 No Gait/Transferring Normal/ bed rest/ wheelchair 0 Yes Weak (short steps with or without shuffle, stooped but able to lift head while walking, may seek 0 No support from furniture) Impaired (short steps with shuffle, may have difficulty arising from chair, head down, impaired 0 No balance) Mental Status Oriented to own ability 0 Yes Overestimates or forgets limitations 0 No Risk Level: Low Risk Score: 0 Erin [name], Erin [name] ([id number]) [id number] Page 3 of 4 Electronic Signature(s) -------------------------------------------------------------------------------- Foot Assessment Details Patient Name: Date of Service: [name], [name] 11/26/2022 1:30 PM Medical Record Number: [id number] Patient Account Number: [id number] Date of Birth/Sex: Treating RN: 1941/06/17 (82 y.o. Erin [name] Primary Care Erin [name]: Erin [name] Other Clinician: Referring Erin [name]: Treating Erin [name]/Extender: Erin [name] in Treatment: 0 Foot Assessment Items Site Locations + = Sensation present, - = Sensation absent, C = Callus, U =  Ulcer R = Redness, W = Warmth, M = Maceration, PU = Pre-ulcerative lesion F = Fissure, S = Swelling, D = Dryness Assessment Right: Left: Other Deformity: No No Prior Foot Ulcer: No No Prior Amputation: No No Charcot Joint: No No Ambulatory Status: Ambulatory Without Help Gait: Steady Notes no hx of diabetes or neuropathy Electronic Signature(s) Signed: 11/26/2022 3:42:40 PM By: Erin [name] Entered By: Erin [name] on 11/26/2022 13:32:04 -------------------------------------------------------------------------------- Nutrition Risk Screening Details Patient Name: Date of Service: [name], [name] 11/26/2022 1:30 PM Medical Record Number: [id number] Patient Account Number: [id number] Date of Birth/Sex: Treating RN: [birth year] (82 y.o. Erin [name] Primary Care Carnella Fryman: Erin [name] Other Clinician: Referring Reva Pinkley: Treating Rykar Lebleu/Extender: Erin [name] in Treatment: 0 Height (in): 60 Weight (lbs): 138 Body Mass Index (BMI): 26.9 Erin [name], Erin [name] ([id number]) [phone] [id number].pdf Page 4 of 4 Nutrition Risk Screening Items Score Screening NUTRITION RISK SCREEN: I have an illness or condition that made me change the kind and/or amount of food I eat 0 No I eat fewer than two meals per day 0 No I eat few fruits and vegetables, or milk products 0 No I have three or more drinks of beer, liquor or wine almost every day 0 No I have tooth or mouth problems that make it hard for me to eat 0 No I don't always have enough money to buy the food I need 0 No I eat alone most of the time 0 No I take three or more different prescribed or over-the-counter drugs a day 1 Yes Without wanting to, I have lost or gained 10 pounds in the last six months 0 No I am not always physically able to shop, cook and/or feed myself 0 No Nutrition Protocols Good Risk Protocol 0 No interventions needed Moderate Risk  Protocol High Risk Proctocol Risk Level: Good Risk Score: 1 Electronic Signature(s) Signed: 11/26/2022 3:42:40 PM By: Erin [name] Entered By: Erin [name] on 11/26/2022 13:31:43

## 2022-12-04 ENCOUNTER — Encounter (HOSPITAL_BASED_OUTPATIENT_CLINIC_OR_DEPARTMENT_OTHER): Payer: Medicare HMO | Admitting: General Surgery

## 2022-12-04 DIAGNOSIS — I10 Essential (primary) hypertension: Secondary | ICD-10-CM | POA: Diagnosis not present

## 2022-12-04 DIAGNOSIS — L97822 Non-pressure chronic ulcer of other part of left lower leg with fat layer exposed: Secondary | ICD-10-CM | POA: Diagnosis not present

## 2022-12-04 DIAGNOSIS — I87332 Chronic venous hypertension (idiopathic) with ulcer and inflammation of left lower extremity: Secondary | ICD-10-CM | POA: Diagnosis not present

## 2022-12-06 NOTE — Progress Notes (Signed)
SOLANGIE, GANLEY ([id number]) [id number] Page 1 of 1 Visit Report for 12/04/2022 SuperBill Details Patient Name: Date of Service: [name], [name] RBA RA N. 12/04/2022 Medical Record Number: [id number] Patient Account Number: [id number] Date of Birth/Sex: Treating RN: [birth year] (82 y.o. 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

## 2022-12-10 ENCOUNTER — Encounter (HOSPITAL_BASED_OUTPATIENT_CLINIC_OR_DEPARTMENT_OTHER): Payer: Medicare HMO | Attending: General Surgery | Admitting: General Surgery

## 2022-12-10 DIAGNOSIS — M109 Gout, unspecified: Secondary | ICD-10-CM | POA: Insufficient documentation

## 2022-12-10 DIAGNOSIS — I872 Venous insufficiency (chronic) (peripheral): Secondary | ICD-10-CM | POA: Insufficient documentation

## 2022-12-10 DIAGNOSIS — I129 Hypertensive chronic kidney disease with stage 1 through stage 4 chronic kidney disease, or unspecified chronic kidney disease: Secondary | ICD-10-CM | POA: Insufficient documentation

## 2022-12-10 DIAGNOSIS — L97822 Non-pressure chronic ulcer of other part of left lower leg with fat layer exposed: Secondary | ICD-10-CM | POA: Insufficient documentation

## 2022-12-10 DIAGNOSIS — I87332 Chronic venous hypertension (idiopathic) with ulcer and inflammation of left lower extremity: Secondary | ICD-10-CM | POA: Insufficient documentation

## 2022-12-10 DIAGNOSIS — M1990 Unspecified osteoarthritis, unspecified site: Secondary | ICD-10-CM | POA: Diagnosis not present

## 2022-12-10 DIAGNOSIS — E039 Hypothyroidism, unspecified: Secondary | ICD-10-CM | POA: Diagnosis not present

## 2022-12-10 DIAGNOSIS — N183 Chronic kidney disease, stage 3 unspecified: Secondary | ICD-10-CM | POA: Diagnosis not present

## 2022-12-16 ENCOUNTER — Encounter (HOSPITAL_BASED_OUTPATIENT_CLINIC_OR_DEPARTMENT_OTHER): Payer: Medicare HMO | Admitting: General Surgery

## 2022-12-16 DIAGNOSIS — E039 Hypothyroidism, unspecified: Secondary | ICD-10-CM | POA: Diagnosis not present

## 2022-12-16 DIAGNOSIS — N183 Chronic kidney disease, stage 3 unspecified: Secondary | ICD-10-CM | POA: Diagnosis not present

## 2022-12-16 DIAGNOSIS — I87332 Chronic venous hypertension (idiopathic) with ulcer and inflammation of left lower extremity: Secondary | ICD-10-CM | POA: Diagnosis not present

## 2022-12-16 DIAGNOSIS — I129 Hypertensive chronic kidney disease with stage 1 through stage 4 chronic kidney disease, or unspecified chronic kidney disease: Secondary | ICD-10-CM | POA: Diagnosis not present

## 2022-12-16 DIAGNOSIS — M1990 Unspecified osteoarthritis, unspecified site: Secondary | ICD-10-CM | POA: Diagnosis not present

## 2022-12-16 DIAGNOSIS — L97822 Non-pressure chronic ulcer of other part of left lower leg with fat layer exposed: Secondary | ICD-10-CM | POA: Diagnosis not present

## 2022-12-16 DIAGNOSIS — I872 Venous insufficiency (chronic) (peripheral): Secondary | ICD-10-CM | POA: Diagnosis not present

## 2022-12-16 DIAGNOSIS — M109 Gout, unspecified: Secondary | ICD-10-CM | POA: Diagnosis not present

## 2022-12-17 NOTE — Progress Notes (Signed)
FRANK, PILGER ([id number]) [id number] Page 1 of 7 Visit Report for 12/16/2022 Arrival Information Details Patient Name: Date of Service: [name], [name] RBA RA N. 12/16/2022 7:45 A M Medical Record Number: [id number] Patient Account Number: [id number] Date of Birth/Sex: Treating RN: [birth year] (82 y.o. [name] Primary Care Taci Sterling: [name] Other Clinician: Referring Evo Aderman: Treating Bennett Ram/Extender: [name] Weeks in Treatment: 2 Visit Information History Since Last Visit Added or deleted any medications: No Patient Arrived: Ambulatory Any new allergies or adverse reactions: No Arrival Time: 07:40 Had a fall or experienced change in No Accompanied By: daughter activities of daily living that may affect Transfer Assistance: None risk of falls: Patient Identification Verified: Yes Signs or symptoms of abuse/neglect since last visito No Secondary Verification Process Completed: Yes Hospitalized since last visit: No Patient Requires Transmission-Based Precautions: No Implantable device outside of the clinic excluding No Patient Has Alerts: No cellular tissue based products placed in the center since last visit: Has Dressing in Place as Prescribed: Yes Has Compression in Place as Prescribed: Yes Pain Present Now: Yes Electronic Signature(s) Signed: 12/16/2022 4:21:55 PM By: Samuella Bruin Entered By: Samuella Bruin on 12/16/2022 07:43:37 -------------------------------------------------------------------------------- Encounter Discharge Information Details Patient Name: Date of Service: [name], [name] 12/16/2022 7:45 A M Medical Record Number: [id number] Patient Account Number: [id number] Date of Birth/Sex: Treating RN: Apr 21, 1941 (82 y.o. [name] Primary Care Naika Noto: [name] Other Clinician: Referring Mung Rinker: Treating Ora Mcnatt/Extender: [name] Weeks in Treatment: 2 Encounter Discharge Information Items Post Procedure Vitals Discharge Condition: Stable Temperature (F): 98.2 Ambulatory Status: Ambulatory Pulse (bpm): 58 Discharge Destination: Home Respiratory Rate (breaths/min): 16 Transportation: Private Auto Blood Pressure (mmHg): 144/53 Accompanied By: daughter Schedule Follow-up Appointment: Yes Clinical Summary of Care: Patient Declined Electronic Signature(s) Signed: 12/16/2022 4:21:55 PM By: Samuella Bruin Entered By: Samuella Bruin on 12/16/2022 08:11:41 -------------------------------------------------------------------------------- Lower Extremity Assessment Details Patient Name: Date of Service: [name], [name] 12/16/2022 7:45 A M Medical Record Number: [id number] Patient Account Number: [id number] Date of Birth/Sex: Treating RN: 07/02/1941 (82 y.o. [name] Primary Care Bernette Seeman: [name] Other Clinician: Referring Zakkiyya Barno: Treating Maliki Gignac/Extender: [name], [name] in Treatment: 2 Edema Assessment N[LeftKENI, WAFER ([id number])] [Right: [id number].pdf Page 2 of 7] Assessed: [Left: No] [Right: No] [Left: Edema] [Right: :] Calf Left: Right: Point of Measurement: From Medial Instep 30 cm Ankle Left: Right: Point of Measurement: From Medial Instep 19.8 cm Vascular Assessment Pulses: Dorsalis Pedis Palpable: [Left:Yes] Electronic Signature(s) Signed: 12/16/2022 4:21:55 PM By: Samuella Bruin Entered By: Samuella Bruin on 12/16/2022 07:50:00 -------------------------------------------------------------------------------- Multi Wound Chart Details Patient Name: Date of Service: [name], [name] 12/16/2022 7:45 A M Medical Record Number: [id number] Patient Account Number: [id number] Date of Birth/Sex: Treating RN: 02-25-1941 (82 y.o. F) Primary Care Danaiya Steadman: [name] Other Clinician: Referring Sherrill Buikema: Treating Sherolyn Trettin/Extender: [name], [name] in Treatment: 2 Vital Signs Height(in): 60 Pulse(bpm): 58 Weight(lbs): 138 Blood Pressure(mmHg): 144/53 Body Mass Index(BMI): 26.9 Temperature(F): 98.2 Respiratory Rate(breaths/min): 16 [1:Photos:] [N/A:N/A] Left, Medial Lower Leg N/A N/A Wound Location: Gradually Appeared N/A N/A Wounding Event: Venous Leg Ulcer N/A N/A Primary Etiology: Cataracts, Arrhythmia, Hypertension, N/A N/A Comorbid History: Peripheral Venous Disease, Gout, Osteoarthritis 08/29/2022 N/A N/A Date Acquired: 2 N/A N/A Weeks of Treatment: Open N/A N/A Wound Status: No N/A N/A Wound Recurrence: 3.2x2x0.1 N/A N/A Measurements L x  W x D (cm) 5.027 N/A N/A A (cm) : rea 0.503 N/A N/A Volume (cm) : 14.40% N/A N/A % Reduction in Area: 14.30% N/A N/A % Reduction in Volume: Full Thickness Without Exposed N/A N/A Classification: Support Structures Medium N/A N/A Exudate Amount: Serosanguineous N/A N/A Exudate Type: red, brown N/A N/A Exudate Color: Distinct, outline attached N/A N/A Wound Margin: Medium (34-66%) N/A N/A Granulation Amount: Red N/A N/A Granulation Quality: Medium (34-66%) N/A N/A Necrotic Amount: BRYA, HAMMERSMITH ([id number]) [phone].pdf Page 3 of 7 Eschar, Adherent Slough N/A N/A Necrotic Tissue: Fat Layer (Subcutaneous Tissue): Yes N/A N/A Exposed Structures: Fascia: No Tendon: No Muscle: No Joint: No Bone: No Small (1-33%) N/A N/A Epithelialization: Debridement - Selective/Open Wound N/A N/A Debridement: Pre-procedure Verification/Time Out 07:57 N/A N/A Taken: Lidocaine 4% Topical Solution N/A N/A Pain Control: Slough N/A N/A Tissue Debrided: Non-Viable Tissue N/A N/A Level: 5.02 N/A N/A Debridement A (sq cm): rea Curette N/A N/A Instrument: Minimum N/A N/A Bleeding: Pressure N/A N/A Hemostasis A  chieved: Procedure was tolerated well N/A N/A Debridement Treatment Response: 3.2x2x0.1 N/A N/A Post Debridement Measurements L x W x D (cm) 0.503 N/A N/A Post Debridement Volume: (cm) No Abnormalities Noted N/A N/A Periwound Skin Texture: No Abnormalities Noted N/A N/A Periwound Skin Moisture: No Abnormalities Noted N/A N/A Periwound Skin Color: No Abnormality N/A N/A Temperature: Debridement N/A N/A Procedures Performed: Treatment Notes Electronic Signature(s) Signed: 12/16/2022 8:08:26 AM By: Duanne Guess MD FACS Entered By: Duanne Guess on 12/16/2022 08:08:26 -------------------------------------------------------------------------------- Multi-Disciplinary Care Plan Details Patient Name: Date of Service: [name], [name] 12/16/2022 7:45 A M Medical Record Number: [id number] Patient Account Number: [id number] Date of Birth/Sex: Treating RN: 05/13/1941 (82 y.o. [name] Primary Care Jenetta Wease: [name] Other Clinician: Referring Leor Whyte: Treating Neah Sporrer/Extender: [name] Weeks in Treatment: 2 Active Inactive Necrotic Tissue Nursing Diagnoses: Impaired tissue integrity related to necrotic/devitalized tissue Knowledge deficit related to management of necrotic/devitalized tissue Goals: Necrotic/devitalized tissue will be minimized in the wound bed Date Initiated: 11/26/2022 Target Resolution Date: 01/09/2023 Goal Status: Active Patient/caregiver will verbalize understanding of reason and process for debridement of necrotic tissue Date Initiated: 11/26/2022 Target Resolution Date: 01/09/2023 Goal Status: Active Interventions: Assess patient pain level pre-, during and post procedure and prior to discharge Provide education on necrotic tissue and debridement process Treatment Activities: Apply topical anesthetic as ordered : 11/26/2022 Notes: Wound/Skin Impairment Nursing Diagnoses: Impaired tissue  integrity Knowledge deficit related to ulceration/compromised skin integrity BELLAANN, RODER ([id number]) [phone].pdf Page 4 of 7 Goals: Patient/caregiver will verbalize understanding of skin care regimen Date Initiated: 11/26/2022 Target Resolution Date: 01/09/2023 Goal Status: Active Interventions: Assess ulceration(s) every visit Treatment Activities: Skin care regimen initiated : 11/26/2022 Topical wound management initiated : 11/26/2022 Notes: Electronic Signature(s) Signed: 12/16/2022 4:21:55 PM By: Samuella Bruin Entered By: Samuella Bruin on 12/16/2022 07:58:48 -------------------------------------------------------------------------------- Pain Assessment Details Patient Name: Date of Service: [name], [name] 12/16/2022 7:45 A M Medical Record Number: [id number] Patient Account Number: [id number] Date of Birth/Sex: Treating RN: 19-Jun-1941 (82 y.o. [name] Primary Care Mariadelaluz Guggenheim: [name] Other Clinician: Referring Taysen Bushart: Treating Deloras Reichard/Extender: [name] Weeks in Treatment: 2 Active Problems Location of Pain Severity and Description of Pain Patient Has Paino Yes Site Locations Pain Location: Pain in Ulcers Duration of the Pain. Constant / Intermittento Intermittent Rate the pain. Current Pain Level: 10 Character of Pain Describe the Pain: Aching Pain Management and Medication  Current Pain Management: Electronic Signature(s) Signed: 12/16/2022 4:21:55 PM By: Samuella Bruin Entered By: Samuella Bruin on 12/16/2022 07:44:55 -------------------------------------------------------------------------------- Patient/Caregiver Education Details Patient Name: Date of Service: [name], [name] 6/11/2024andnbsp7:45 A M Medical Record Number: [id number] Patient Account Number: [id number] Date of Birth/Gender: Treating RN: [birth year] (82 y.o. [name] Primary Care Physician: [name] Other Clinician: TERRYLEE, ARENDER ([id number]) [id number] Page 5 of 7 Referring Physician: Treating Physician/Extender: [name] in Treatment: 2 Education Assessment Education Provided To: Patient Education Topics Provided Wound/Skin Impairment: Methods: Explain/Verbal Responses: Reinforcements needed, State content correctly Electronic Signature(s) Signed: 12/16/2022 4:21:55 PM By: Samuella Bruin Entered By: Samuella Bruin on 12/16/2022 07:59:00 -------------------------------------------------------------------------------- Wound Assessment Details Patient Name: Date of Service: [name], [name] 12/16/2022 7:45 A M Medical Record Number: [id number] Patient Account Number: [id number] Date of Birth/Sex: Treating RN: 1941-05-27 (82 y.o. [name] Primary Care Farheen Pfahler: [name] Other Clinician: Referring Dezra Mandella: Treating Leeta Grimme/Extender: [name], [name] in Treatment: 2 Wound Status Wound Number: 1 Primary Venous Leg Ulcer Etiology: Wound Location: Left, Medial Lower Leg Wound Open Wounding Event: Gradually Appeared Status: Date Acquired: 08/29/2022 Comorbid Cataracts, Arrhythmia, Hypertension, Peripheral Venous Weeks Of Treatment: 2 History: Disease, Gout, Osteoarthritis Clustered Wound: No Photos Wound Measurements Length: (cm) 3.2 Width: (cm) 2 Depth: (cm) 0.1 Area: (cm) 5.027 Volume: (cm) 0.503 % Reduction in Area: 14.4% % Reduction in Volume: 14.3% Epithelialization: Small (1-33%) Tunneling: No Undermining: No Wound Description Classification: Full Thickness Without Exposed Support Structures Wound Margin: Distinct, outline attached Exudate Amount: Medium Exudate Type: Serosanguineous Exudate Color: red, brown Foul Odor After Cleansing: No Slough/Fibrino Yes Wound Bed Granulation  Amount: Medium (34-66%) Exposed Structure Granulation Quality: Red Fascia Exposed: No Necrotic Amount: Medium (34-66%) Fat Layer (Subcutaneous Tissue) Exposed: Yes Necrotic Quality: Eschar, Adherent Slough Tendon Exposed: No TASHAUNA, RUMAN ([id number]) [id number] Page 6 of 7 Muscle Exposed: No Joint Exposed: No Bone Exposed: No Periwound Skin Texture Texture Color No Abnormalities Noted: Yes No Abnormalities Noted: Yes Moisture Temperature / Pain No Abnormalities Noted: Yes Temperature: No Abnormality Treatment Notes Wound #1 (Lower Leg) Wound Laterality: Left, Medial Cleanser Soap and Water Discharge Instruction: May shower and wash wound with dial antibacterial soap and water prior to dressing change. Wound Cleanser Discharge Instruction: Cleanse the wound with wound cleanser prior to applying a clean dressing using gauze sponges, not tissue or cotton balls. Peri-Wound Care Sween Lotion (Moisturizing lotion) Discharge Instruction: Apply moisturizing lotion as directed Topical Primary Dressing IODOFLEX 0.9% Cadexomer Iodine Pad 4x6 cm Discharge Instruction: Apply to wound bed as instructed Secondary Dressing ABD Pad, 5x9 Discharge Instruction: Apply over primary dressing as directed. Woven Gauze Sponge, Non-Sterile 4x4 in Discharge Instruction: Apply over primary dressing as directed. Secured With Compression Wrap Kerlix Roll 4.5x3.1 (in/yd) Discharge Instruction: Apply Kerlix and Coban compression as directed. Coban Self-Adherent Wrap 4x5 (in/yd) Discharge Instruction: Apply over Kerlix as directed. Compression Stockings Add-Ons Electronic Signature(s) Signed: 12/16/2022 4:21:55 PM By: Samuella Bruin Entered By: Samuella Bruin on 12/16/2022 07:52:50 -------------------------------------------------------------------------------- Vitals Details Patient Name: Date of Service: [name], [name] 12/16/2022 7:45 A M Medical  Record Number: [id number] Patient Account Number: [id number] Date of Birth/Sex: Treating RN: Sep 30, 1940 (82 y.o. [name] Primary Care Viviene Thurston: [name] Other Clinician: Referring Alajiah Dutkiewicz: Treating Ren Grasse/Extender: [name], [name] in Treatment: 2 Vital Signs Time Taken: 07:43 Temperature (F): 98.2 Height (in): 60 Pulse (  bpm): 58 Weight (lbs): 138 Respiratory Rate (breaths/min): 16 Body Mass Index (BMI): 26.9 Blood Pressure (mmHg): 144/53 Reference Range: 80 - 120 mg / dl SHANTELL, REMALY ([id number]) [phone].pdf Page 7 of 7 Electronic Signature(s) Signed: 12/16/2022 4:21:55 PM By: Samuella Bruin Entered By: Samuella Bruin on 12/16/2022 07:45:04

## 2022-12-17 NOTE — Progress Notes (Signed)
Erin [name], Erin [name] ([id number]) [id number] Page 1 of 8 Visit Report for 12/16/2022 Chief Complaint Document Details Patient Name: Date of Service: [name], [name] RBA RA N. 12/16/2022 7:45 A M Medical Record Number: [id number] Patient Account Number: [id number] Date of Birth/Sex: Treating RN: 09-12-1940 (82 y.o. F) Primary Care Provider: Hoyle [name] Other Clinician: Referring Provider: Treating Provider/Extender: [name] Weeks in Treatment: 2 Information Obtained from: Patient Chief Complaint Patient presents for treatment of an open ulcer due to venous insufficiency Electronic Signature(s) Signed: 12/16/2022 8:08:33 AM By: Duanne Guess MD FACS Entered By: Duanne Guess on 12/16/2022 08:08:33 -------------------------------------------------------------------------------- Debridement Details Patient Name: Date of Service: [name], [name] 12/16/2022 7:45 A M Medical Record Number: [id number] Patient Account Number: [id number] Date of Birth/Sex: Treating RN: Nov 11, 1940 (82 y.o. Erin [name] Primary Care Provider: Hoyle [name] Other Clinician: Referring Provider: Treating Provider/Extender: [name] Weeks in Treatment: 2 Debridement Performed for Assessment: Wound #1 Left,Medial Lower Leg Performed By: Physician Duanne Guess, MD Debridement Type: Debridement Severity of Tissue Pre Debridement: Fat layer exposed Level of Consciousness (Pre-procedure): Awake and Alert Pre-procedure Verification/Time Out Yes - 07:57 Taken: Start Time: 07:57 Pain Control: Lidocaine 4% T opical Solution Percent of Wound Bed Debrided: 100% T Area Debrided (cm): otal 5.02 Tissue and other material debrided: Non-Viable, Slough, Slough Level: Non-Viable Tissue Debridement Description: Selective/Open Wound Instrument: Curette Bleeding: Minimum Hemostasis Achieved: Pressure Response to  Treatment: Procedure was tolerated well Level of Consciousness (Post- Awake and Alert procedure): Post Debridement Measurements of Total Wound Length: (cm) 3.2 Width: (cm) 2 Depth: (cm) 0.1 Volume: (cm) 0.503 Character of Wound/Ulcer Post Debridement: Improved Severity of Tissue Post Debridement: Fat layer exposed Post Procedure Diagnosis Same as Pre-procedure Notes scribed for Dr. Lady Gary by Samuella Bruin, RN Electronic Signature(s) Signed: 12/16/2022 8:46:38 AM By: Duanne Guess MD FACS Signed: 12/16/2022 4:21:55 PM By: Lance Coon ([id number]) [id number] Page 2 of 8 Entered By: Samuella Bruin on 12/16/2022 08:00:30 -------------------------------------------------------------------------------- HPI Details Patient Name: Date of Service: [name], [name] RBA RA N. 12/16/2022 7:45 A M Medical Record Number: [id number] Patient Account Number: [id number] Date of Birth/Sex: Treating RN: 07/29/1940 (82 y.o. F) Primary Care Provider: Hoyle [name] Other Clinician: Referring Provider: Treating Provider/Extender: [name] Weeks in Treatment: 2 History of Present Illness HPI Description: ADMISSION 11/26/2022 This is an 82 year old woman who was referred by her primary care provider for a persistent nonhealing wound on her left lower extremity. It has been present since February of this year. 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 I do not see any formal venous reflux studies in the electronic medical record. She is not diabetic. She does not smoke. Her PCP has been washing her leg each week and applying a Kerlix and Coban wrap, but has not performed any formal debridement and has not been using any sort of topical contact layer or ointment. ABI in clinic was noncompressible, but she has a palpable pedal  pulse. 12/10/2022: The wound is a bit smaller and cleaner today. There is still fairly heavy slough on the surface. Edema control is acceptable. 12/16/2022: The wound is measuring the slightest bit smaller today. There is still thick slough on the surface. Edema control is good. Electronic Signature(s) Signed: 12/16/2022 8:09:04 AM By: Duanne Guess MD FACS Entered By: Duanne Guess on 12/16/2022 08:09:04 --------------------------------------------------------------------------------  Physical Exam Details Patient Name: Date of Service: [name], [name] 12/16/2022 7:45 A M Medical Record Number: [id number] Patient Account Number: [id number] Date of Birth/Sex: Treating RN: 22-Jun-1941 (82 y.o. F) Primary Care Provider: Hoyle [name] Other Clinician: Referring Provider: Treating Provider/Extender: [name], [name] in Treatment: 2 Constitutional Slightly hypertensive. Slightly bradycardic. . . no acute distress. Respiratory Normal work of breathing on room air. Notes 12/16/2022: The wound is measuring the slightest bit smaller today. There is still thick slough on the surface. Edema control is good. Electronic Signature(s) Signed: 12/16/2022 8:09:41 AM By: Duanne Guess MD FACS Entered By: Duanne Guess on 12/16/2022 08:09:41 -------------------------------------------------------------------------------- Physician Orders Details Patient Name: Date of Service: [name], [name] 12/16/2022 7:45 A M Medical Record Number: [id number] Patient Account Number: [id number] Date of Birth/Sex: Treating RN: 12-21-1940 (82 y.o. Erin [name] Primary Care Provider: Hoyle [name] Other Clinician: Referring Provider: Treating Provider/Extender: [name] Weeks in Treatment: 2 Verbal / Phone Orders: No Diagnosis Coding ICD-10 Coding [name], [name] ([id number]) [id number] Page 3 of  8 Code Description [phone] Non-pressure chronic ulcer of other part of left lower leg with fat layer exposed I87.332 Chronic venous hypertension (idiopathic) with ulcer and inflammation of left lower extremity I10 Essential (primary) hypertension Follow-up Appointments ppointment in 1 week. - Dr. Lady Gary - room 2 Return A Anesthetic (In clinic) Topical Lidocaine 5% applied to wound bed Bathing/ Shower/ Hygiene May shower with protection but do not get wound dressing(s) wet. Protect dressing(s) with water repellant cover (for example, large plastic bag) or a cast cover and may then take shower. Edema Control - Lymphedema / SCD / Other Elevate legs to the level of the heart or above for 30 minutes daily and/or when sitting for 3-4 times a day throughout the day. Avoid standing for long periods of time. Wound Treatment Wound #1 - Lower Leg Wound Laterality: Left, Medial Cleanser: Soap and Water 1 x Per Week/30 Days Discharge Instructions: May shower and wash wound with dial antibacterial soap and water prior to dressing change. Cleanser: Wound Cleanser 1 x Per Week/30 Days Discharge Instructions: Cleanse the wound with wound cleanser prior to applying a clean dressing using gauze sponges, not tissue or cotton balls. Peri-Wound Care: Sween Lotion (Moisturizing lotion) 1 x Per Week/30 Days Discharge Instructions: Apply moisturizing lotion as directed Prim Dressing: IODOFLEX 0.9% Cadexomer Iodine Pad 4x6 cm 1 x Per Week/30 Days ary Discharge Instructions: Apply to wound bed as instructed Secondary Dressing: ABD Pad, 5x9 1 x Per Week/30 Days Discharge Instructions: Apply over primary dressing as directed. Secondary Dressing: Woven Gauze Sponge, Non-Sterile 4x4 in 1 x Per Week/30 Days Discharge Instructions: Apply over primary dressing as directed. Compression Wrap: Kerlix Roll 4.5x3.1 (in/yd) 1 x Per Week/30 Days Discharge Instructions: Apply Kerlix and Coban compression as  directed. Compression Wrap: Coban Self-Adherent Wrap 4x5 (in/yd) 1 x Per Week/30 Days Discharge Instructions: Apply over Kerlix as directed. Patient Medications llergies: codeine, morphine A Notifications Medication Indication Start End 12/16/2022 lidocaine DOSE topical 5 % ointment - ointment topical Electronic Signature(s) Signed: 12/16/2022 8:46:38 AM By: Duanne Guess MD FACS Entered By: Duanne Guess on 12/16/2022 08:10:07 -------------------------------------------------------------------------------- Problem List Details Patient Name: Date of Service: [name], [name] 12/16/2022 7:45 A M Medical Record Number: [id number] Patient Account Number: [id number] Date of Birth/Sex: Treating RN: 1941-03-12 (82 y.o. F) Primary Care Provider: Hoyle [name] Other Clinician: Referring Provider:  Treating Provider/Extender: [name], [name] in Treatment: 2 Active Problems ICD-10 HAVANNAH, STREAT ([id number]) [id number].pdf Page 4 of 8 Encounter Code Description Active Date MDM Diagnosis [phone] Non-pressure chronic ulcer of other part of left lower leg with fat layer exposed5/22/2024 No Yes I87.332 Chronic venous hypertension (idiopathic) with ulcer and inflammation of left 11/26/2022 No Yes lower extremity I10 Essential (primary) hypertension 11/26/2022 No Yes Inactive Problems Resolved Problems Electronic Signature(s) Signed: 12/16/2022 8:08:08 AM By: Duanne Guess MD FACS Entered By: Duanne Guess on 12/16/2022 08:08:08 -------------------------------------------------------------------------------- Progress Note Details Patient Name: Date of Service: [name], [name] 12/16/2022 7:45 A M Medical Record Number: [id number] Patient Account Number: [id number] Date of Birth/Sex: Treating RN: [birth year] (82 y.o. F) Primary Care Provider: Hoyle [name] Other Clinician: Referring Provider: Treating  Provider/Extender: [name] Weeks in Treatment: 2 Subjective Chief Complaint Information obtained from Patient Patient presents for treatment of an open ulcer due to venous insufficiency History of Present Illness (HPI) ADMISSION 11/26/2022 This is an 82 year old woman who was referred by her primary care provider for a persistent nonhealing wound on her left lower extremity. It has been present since February of this year. 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 I do not see any formal venous reflux studies in the electronic medical record. She is not diabetic. She does not smoke. Her PCP has been washing her leg each week and applying a Kerlix and Coban wrap, but has not performed any formal debridement and has not been using any sort of topical contact layer or ointment. ABI in clinic was noncompressible, but she has a palpable pedal pulse. 12/10/2022: The wound is a bit smaller and cleaner today. There is still fairly heavy slough on the surface. Edema control is acceptable. 12/16/2022: The wound is measuring the slightest bit smaller today. There is still thick slough on the surface. Edema control is good. Patient History Family History Cancer - Siblings, Lung Disease - Siblings, No family history of Diabetes, Heart Disease, Hereditary Spherocytosis, Hypertension, Kidney Disease, Seizures, Stroke, Thyroid Problems, Tuberculosis. Social History Never smoker, Marital Status - Widowed, Alcohol Use - Never, Drug Use - No History, Caffeine Use - Moderate. Medical History Eyes Patient has history of Cataracts Cardiovascular Patient has history of Arrhythmia - bundle branch block, Hypertension, Peripheral Venous Disease Musculoskeletal Patient has history of Gout, Osteoarthritis Hospitalization/Surgery History - Salpingoophorectomy. - Colon resection. - Appendectomy. -  Cystoscopy w/ retrogrades. - Joint replacement. - Mastectomy right. - gastric ulcer repair. - Abdominal hysterectomy. Medical A Surgical History Notes nd Hematologic/Lymphatic thrombocytopenia Gastrointestinal diverticulosis, GERD Endocrine PRICILA, BRIDGE ([id number]) [id number].pdf Page 5 of 8 hypothyroidism Genitourinary CKD stage III Musculoskeletal DJD, osteoporosis Oncologic breast cancer Objective Constitutional Slightly hypertensive. Slightly bradycardic. no acute distress. Vitals Time Taken: 7:43 AM, Height: 60 in, Weight: 138 lbs, BMI: 26.9, Temperature: 98.2 F, Pulse: 58 bpm, Respiratory Rate: 16 breaths/min, Blood Pressure: 144/53 mmHg. Respiratory Normal work of breathing on room air. General Notes: 12/16/2022: The wound is measuring the slightest bit smaller today. There is still thick slough on the surface. Edema control is good. Integumentary (Hair, Skin) Wound #1 status is Open. Original cause of wound was Gradually Appeared. The date acquired was: 08/29/2022. The wound has been in treatment 2 weeks. The wound is located on the Left,Medial Lower Leg. The wound measures 3.2cm length x 2cm width x 0.1cm depth; 5.027cm^2 area  and 0.503cm^3 volume. There is Fat Layer (Subcutaneous Tissue) exposed. There is no tunneling or undermining noted. There is a medium amount of serosanguineous drainage noted. The wound margin is distinct with the outline attached to the wound base. There is medium (34-66%) red granulation within the wound bed. There is a medium (34- 66%) amount of necrotic tissue within the wound bed including Eschar and Adherent Slough. The periwound skin appearance had no abnormalities noted for texture. The periwound skin appearance had no abnormalities noted for moisture. The periwound skin appearance had no abnormalities noted for color. Periwound temperature was noted as No Abnormality. Assessment Active  Problems ICD-10 Non-pressure chronic ulcer of other part of left lower leg with fat layer exposed Chronic venous hypertension (idiopathic) with ulcer and inflammation of left lower extremity Essential (primary) hypertension Procedures Wound #1 Pre-procedure diagnosis of Wound #1 is a Venous Leg Ulcer located on the Left,Medial Lower Leg .Severity of Tissue Pre Debridement is: Fat layer exposed. There was a Selective/Open Wound Non-Viable Tissue Debridement with a total area of 5.02 sq cm performed by Duanne Guess, MD. With the following instrument(s): Curette to remove Non-Viable tissue/material. Material removed includes [facility] after achieving pain control using Lidocaine 4% Topical Solution. No specimens were taken. A time out was conducted at 07:57, prior to the start of the procedure. A Minimum amount of bleeding was controlled with Pressure. The procedure was tolerated well. Post Debridement Measurements: 3.2cm length x 2cm width x 0.1cm depth; 0.503cm^3 volume. Character of Wound/Ulcer Post Debridement is improved. Severity of Tissue Post Debridement is: Fat layer exposed. Post procedure Diagnosis Wound #1: Same as Pre-Procedure General Notes: scribed for Dr. Lady Gary by Samuella Bruin, RN. Plan Follow-up Appointments: Return Appointment in 1 week. - Dr. Lady Gary - room 2 Anesthetic: (In clinic) Topical Lidocaine 5% applied to wound bed Bathing/ Shower/ Hygiene: May shower with protection but do not get wound dressing(s) wet. Protect dressing(s) with water repellant cover (for example, large plastic bag) or a cast cover and may then take shower. Edema Control - Lymphedema / SCD / Other: Elevate legs to the level of the heart or above for 30 minutes daily and/or when sitting for 3-4 times a day throughout the day. Avoid standing for long periods of time. YOSHIYE, MOFFO ([id number]) [id number] Page 6 of 8 The following medication(s) was prescribed:  lidocaine topical 5 % ointment ointment topical was prescribed at facility WOUND #1: - Lower Leg Wound Laterality: Left, Medial Cleanser: Soap and Water 1 x Per Week/30 Days Discharge Instructions: May shower and wash wound with dial antibacterial soap and water prior to dressing change. Cleanser: Wound Cleanser 1 x Per Week/30 Days Discharge Instructions: Cleanse the wound with wound cleanser prior to applying a clean dressing using gauze sponges, not tissue or cotton balls. Peri-Wound Care: Sween Lotion (Moisturizing lotion) 1 x Per Week/30 Days Discharge Instructions: Apply moisturizing lotion as directed Prim Dressing: IODOFLEX 0.9% Cadexomer Iodine Pad 4x6 cm 1 x Per Week/30 Days ary Discharge Instructions: Apply to wound bed as instructed Secondary Dressing: ABD Pad, 5x9 1 x Per Week/30 Days Discharge Instructions: Apply over primary dressing as directed. Secondary Dressing: Woven Gauze Sponge, Non-Sterile 4x4 in 1 x Per Week/30 Days Discharge Instructions: Apply over primary dressing as directed. Com pression Wrap: Kerlix Roll 4.5x3.1 (in/yd) 1 x Per Week/30 Days Discharge Instructions: Apply Kerlix and Coban compression as directed. Com pression Wrap: Coban Self-Adherent Wrap 4x5 (in/yd) 1 x Per Week/30 Days Discharge Instructions: Apply over Kerlix as directed. 12/16/2022:  The wound is measuring the slightest bit smaller today. There is still thick slough on the surface. Edema control is good. I used a curette to debride slough from the wound. I think it would benefit from additional chemical debridement again so we will continue with Iodoflex. Continue Kerlix and Coban wrap. Follow-up in 1 week. Electronic Signature(s) Signed: 12/16/2022 8:10:51 AM By: Duanne Guess MD FACS Entered By: Duanne Guess on 12/16/2022 08:10:50 -------------------------------------------------------------------------------- HxROS Details Patient Name: Date of Service: [name], [name]  12/16/2022 7:45 A M Medical Record Number: [id number] Patient Account Number: [id number] Date of Birth/Sex: Treating RN: March 10, 1941 (82 y.o. F) Primary Care Provider: Hoyle [name] Other Clinician: Referring Provider: Treating Provider/Extender: [name], [name] in Treatment: 2 Eyes Medical History: Positive for: Cataracts Hematologic/Lymphatic Medical History: Past Medical History Notes: thrombocytopenia Cardiovascular Medical History: Positive for: Arrhythmia - bundle branch block; Hypertension; Peripheral Venous Disease Gastrointestinal Medical History: Past Medical History Notes: diverticulosis, GERD Endocrine Medical History: Past Medical History Notes: hypothyroidism Genitourinary Medical History: Past Medical History Notes: CKD stage III DANALYNN, BERHOW ([id number]) [id number] Page 7 of 8 Musculoskeletal Medical History: Positive for: Gout; Osteoarthritis Past Medical History Notes: DJD, osteoporosis Oncologic Medical History: Past Medical History Notes: breast cancer HBO Extended History Items Eyes: Cataracts Immunizations Pneumococcal Vaccine: Received Pneumococcal Vaccination: Yes Received Pneumococcal Vaccination On or After 60th Birthday: No Implantable Devices No devices added Hospitalization / Surgery History Type of Hospitalization/Surgery Salpingoophorectomy Colon resection Appendectomy Cystoscopy w/ retrogrades Joint replacement Mastectomy right gastric ulcer repair Abdominal hysterectomy Family and Social History Cancer: Yes - Siblings; Diabetes: No; Heart Disease: No; Hereditary Spherocytosis: No; Hypertension: No; Kidney Disease: No; Lung Disease: Yes - Siblings; Seizures: No; Stroke: No; Thyroid Problems: No; Tuberculosis: No; Never smoker; Marital Status - Widowed; Alcohol Use: Never; Drug Use: No History; Caffeine Use: Moderate; Financial Concerns: No; Food, Clothing or  Shelter Needs: No; Support System Lacking: No; Transportation Concerns: No Electronic Signature(s) Signed: 12/16/2022 8:46:38 AM By: Duanne Guess MD FACS Entered By: Duanne Guess on 12/16/2022 08:09:12 -------------------------------------------------------------------------------- SuperBill Details Patient Name: Date of Service: [name], [name] 12/16/2022 Medical Record Number: [id number] Patient Account Number: [id number] Date of Birth/Sex: Treating RN: 1940-12-17 (82 y.o. F) Primary Care Provider: Hoyle [name] Other Clinician: Referring Provider: Treating Provider/Extender: [name], [name] in Treatment: 2 Diagnosis Coding ICD-10 Codes Code Description [phone] Non-pressure chronic ulcer of other part of left lower leg with fat layer exposed I87.332 Chronic venous hypertension (idiopathic) with ulcer and inflammation of left lower extremity I10 Essential (primary) hypertension Facility Procedures Physician Procedures : CPT4 Code Description Modifier [id number] [id number] - WC PHYS LEVEL 3 - EST PT 25 ICD-10 Diagnosis Description L97.822 Non-pressure chronic ulcer of other part of left lower leg with fat layer exposed I87.332 Chronic venous hypertension (idiopathic) with  ulcer and inflammation of left lower extremity I10 Essential (primary) hypertension Quantity: 1 : [phone] [id number] - WC PHYS DEBR WO ANESTH 20 SQ CM ICD-10 Diagnosis Description L97.822 Non-pressure chronic ulcer of other part of left lower leg with fat layer exposed Quantity: 1 Electronic Signature(s) Signed: 12/16/2022 8:11:12 AM By: Duanne Guess MD FACS Entered By: Duanne Guess on 12/16/2022 08:11:12

## 2022-12-23 ENCOUNTER — Encounter (HOSPITAL_BASED_OUTPATIENT_CLINIC_OR_DEPARTMENT_OTHER): Payer: Medicare HMO | Admitting: General Surgery

## 2022-12-23 DIAGNOSIS — L97822 Non-pressure chronic ulcer of other part of left lower leg with fat layer exposed: Secondary | ICD-10-CM | POA: Diagnosis not present

## 2022-12-23 DIAGNOSIS — E039 Hypothyroidism, unspecified: Secondary | ICD-10-CM | POA: Diagnosis not present

## 2022-12-23 DIAGNOSIS — I872 Venous insufficiency (chronic) (peripheral): Secondary | ICD-10-CM | POA: Diagnosis not present

## 2022-12-23 DIAGNOSIS — M1990 Unspecified osteoarthritis, unspecified site: Secondary | ICD-10-CM | POA: Diagnosis not present

## 2022-12-23 DIAGNOSIS — N183 Chronic kidney disease, stage 3 unspecified: Secondary | ICD-10-CM | POA: Diagnosis not present

## 2022-12-23 DIAGNOSIS — M109 Gout, unspecified: Secondary | ICD-10-CM | POA: Diagnosis not present

## 2022-12-23 DIAGNOSIS — I87332 Chronic venous hypertension (idiopathic) with ulcer and inflammation of left lower extremity: Secondary | ICD-10-CM | POA: Diagnosis not present

## 2022-12-23 DIAGNOSIS — I129 Hypertensive chronic kidney disease with stage 1 through stage 4 chronic kidney disease, or unspecified chronic kidney disease: Secondary | ICD-10-CM | POA: Diagnosis not present

## 2022-12-23 NOTE — Progress Notes (Signed)
Erin [name], Erin [name] ([id number]) [id number] Page 1 of 9 Visit Report for 12/23/2022 Arrival Information Details Patient Name: Date of Service: [name], [name] RBA RA N. 12/23/2022 7:45 A M Medical Record Number: [id number] Patient Account Number: [id number] Date of Birth/Sex: Treating RN: May 22, 1941 (82 y.o. Erin [name] Primary Care Nakea Gouger: [name] Other Clinician: Referring Tova Vater: Treating Yeraldy Spike/Extender: [name] Weeks in Treatment: 3 Visit Information History Since Last Visit Added or deleted any medications: No Patient Arrived: Ambulatory Any new allergies or adverse reactions: No Arrival Time: 07:42 Had a fall or experienced change in No Accompanied By: daughter activities of daily living that may affect Transfer Assistance: None risk of falls: Patient Identification Verified: Yes Signs or symptoms of abuse/neglect since last visito No Secondary Verification Process Completed: Yes Hospitalized since last visit: No Patient Requires Transmission-Based Precautions: No Implantable device outside of the clinic excluding No Patient Has Alerts: No cellular tissue based products placed in the center since last visit: Has Dressing in Place as Prescribed: Yes Has Compression in Place as Prescribed: Yes Pain Present Now: No Electronic Signature(s) Signed: 12/23/2022 4:05:21 PM By: Samuella Bruin Entered By: Samuella Bruin on 12/23/2022 07:44:42 -------------------------------------------------------------------------------- Encounter Discharge Information Details Patient Name: Date of Service: [name], [name] 12/23/2022 7:45 A M Medical Record Number: [id number] Patient Account Number: [id number] Date of Birth/Sex: Treating RN: [birth year] (82 y.o. Erin [name] Primary Care Erin [name]: [name] Other Clinician: Referring Itati Brocksmith: Treating Martrice Apt/Extender: [name] Weeks in Treatment: 3 Encounter Discharge Information Items Post Procedure Vitals Discharge Condition: Stable Temperature (F): 98.3 Ambulatory Status: Ambulatory Pulse (bpm): 78 Discharge Destination: Home Respiratory Rate (breaths/min): 18 Transportation: Private Auto Blood Pressure (mmHg): 190/83 Accompanied By: daughter Schedule Follow-up Appointment: Yes Clinical Summary of Care: Patient Declined Electronic Signature(s) Signed: 12/23/2022 4:05:21 PM By: Samuella Bruin Entered By: Samuella Bruin on 12/23/2022 08:32:40 [name] ([id number]) [id number] Page 2 of 9 -------------------------------------------------------------------------------- Lower Extremity Assessment Details Patient Name: Date of Service: [name], [name] 12/23/2022 7:45 A M Medical Record Number: [id number] Patient Account Number: [id number] Date of Birth/Sex: Treating RN: 13-Dec-1940 (82 y.o. Erin [name] Primary Care [name]: [name] Other Clinician: Referring Caylyn Tedeschi: Treating Erin [name]/Extender: [name], [name] in Treatment: 3 Edema Assessment Assessed: [Left: No] [Right: No] [Left: Edema] [Right: :] Calf Left: Right: Point of Measurement: From Medial Instep 28.5 cm Ankle Left: Right: Point of Measurement: From Medial Instep 19.5 cm Vascular Assessment Pulses: Dorsalis Pedis Palpable: [Left:Yes] Electronic Signature(s) Signed: 12/23/2022 4:05:21 PM By: Samuella Bruin Entered By: Samuella Bruin on 12/23/2022 07:48:43 -------------------------------------------------------------------------------- Multi Wound Chart Details Patient Name: Date of Service: [name], [name] 12/23/2022 7:45 A M Medical Record Number: [id number] Patient Account Number: [id number] Date of Birth/Sex: Treating RN: 10-23-1940 (82 y.o. F) Primary Care Ikhlas Albo: [name] Other  Clinician: Referring Tionna Gigante: Treating Erin [name]/Extender: [name], [name] in Treatment: 3 Vital Signs Height(in): 60 Pulse(bpm): 78 Weight(lbs): 138 Blood Pressure(mmHg): 190/83 Body Mass Index(BMI): 26.9 Temperature(F): 98.3 Respiratory Rate(breaths/min): 18 [1:Photos:] [N/A:N/A [id number] Page 3 of 9] Left, Medial Lower Leg Left, Proximal, Medial Lower Leg N/A Wound Location: Gradually Appeared Blister N/A Wounding Event: Venous Leg Ulcer Venous Leg Ulcer N/A Primary Etiology: Cataracts, Arrhythmia, Hypertension, Cataracts, Arrhythmia, Hypertension, N/A Comorbid History: Peripheral Venous Disease, Gout, Peripheral Venous Disease, Gout, Osteoarthritis Osteoarthritis 08/29/2022 12/23/2022 N/A Date Acquired: 3 0 N/A Weeks of Treatment:  Open Open N/A Wound Status: No No N/A Wound Recurrence: 3x1.9x0.1 0.4x0.5x0.1 N/A Measurements L x W x D (cm) 4.477 0.157 N/A A (cm) : rea 0.448 0.016 N/A Volume (cm) : 23.80% N/A N/A % Reduction in A rea: 23.70% N/A N/A % Reduction in Volume: Full Thickness Without Exposed Full Thickness Without Exposed N/A Classification: Support Structures Support Structures Medium Medium N/A Exudate A mount: Serosanguineous Serosanguineous N/A Exudate Type: red, brown red, brown N/A Exudate Color: Distinct, outline attached Distinct, outline attached N/A Wound Margin: Large (67-100%) Large (67-100%) N/A Granulation A mount: Red Red N/A Granulation Quality: Small (1-33%) Small (1-33%) N/A Necrotic A mount: Eschar, Adherent Slough Adherent Slough N/A Necrotic Tissue: Fat Layer (Subcutaneous Tissue): Yes Fat Layer (Subcutaneous Tissue): Yes N/A Exposed Structures: Fascia: No Fascia: No Tendon: No Tendon: No Muscle: No Muscle: No Joint: No Joint: No Bone: No Bone: No Small (1-33%) None N/A Epithelialization: Debridement - Selective/Open Wound Debridement - Selective/Open Wound  N/A Debridement: Pre-procedure Verification/Time Out 07:58 07:58 N/A Taken: Lidocaine 5% topical ointment Lidocaine 5% topical ointment N/A Pain Control: [facility] N/A Tissue Debrided: Non-Viable Tissue Non-Viable Tissue N/A Level: 4.47 0.16 N/A Debridement A (sq cm): rea Curette Curette N/A Instrument: Minimum Minimum N/A Bleeding: Pressure Pressure N/A Hemostasis A chieved: Procedure was tolerated well Procedure was tolerated well N/A Debridement Treatment Response: 3x1.9x0.1 0.4x0.5x0.1 N/A Post Debridement Measurements L x W x D (cm) 0.448 0.016 N/A Post Debridement Volume: (cm) No Abnormalities Noted No Abnormalities Noted N/A Periwound Skin Texture: No Abnormalities Noted No Abnormalities Noted N/A Periwound Skin Moisture: No Abnormalities Noted No Abnormalities Noted N/A Periwound Skin Color: No Abnormality No Abnormality N/A Temperature: Debridement Debridement N/A Procedures Performed: Treatment Notes Electronic Signature(s) Signed: 12/23/2022 8:05:13 AM By: Duanne Guess MD FACS Entered By: Duanne Guess on 12/23/2022 08:05:13 -------------------------------------------------------------------------------- Multi-Disciplinary Care Plan Details Patient Name: Date of Service: [name], [name] 12/23/2022 7:45 A M Medical Record Number: [id number] Patient Account Number: [id number] Date of Birth/Sex: Treating RN: Nov 29, 1940 (82 y.o. Erin [name] Primary Care Roarke Marciano: [name] Other Clinician: Referring Mindie Rawdon: Treating Jackob Crookston/Extender: [name], [name] in Treatment: [address] BRENYN, BALCHUNAS ([id number]) [id number] Page 4 of 9 Necrotic Tissue Nursing Diagnoses: Impaired tissue integrity related to necrotic/devitalized tissue Knowledge deficit related to management of necrotic/devitalized tissue Goals: Necrotic/devitalized tissue will be minimized in the  wound bed Date Initiated: 11/26/2022 Target Resolution Date: 01/09/2023 Goal Status: Active Patient/caregiver will verbalize understanding of reason and process for debridement of necrotic tissue Date Initiated: 11/26/2022 Target Resolution Date: 01/09/2023 Goal Status: Active Interventions: Assess patient pain level pre-, during and post procedure and prior to discharge Provide education on necrotic tissue and debridement process Treatment Activities: Apply topical anesthetic as ordered : 11/26/2022 Notes: Wound/Skin Impairment Nursing Diagnoses: Impaired tissue integrity Knowledge deficit related to ulceration/compromised skin integrity Goals: Patient/caregiver will verbalize understanding of skin care regimen Date Initiated: 11/26/2022 Target Resolution Date: 01/09/2023 Goal Status: Active Interventions: Assess ulceration(s) every visit Treatment Activities: Skin care regimen initiated : 11/26/2022 Topical wound management initiated : 11/26/2022 Notes: Electronic Signature(s) Signed: 12/23/2022 4:05:21 PM By: Samuella Bruin Entered By: Samuella Bruin on 12/23/2022 08:31:59 -------------------------------------------------------------------------------- Pain Assessment Details Patient Name: Date of Service: [name], [name] 12/23/2022 7:45 A M Medical Record Number: [id number] Patient Account Number: [id number] Date of Birth/Sex: Treating RN: 1941/04/29 (82 y.o. Erin [name] Primary Care Mikel Pyon: [name] Other Clinician: Referring Eschol Auxier: Treating Kobey Sides/Extender: [name],  [name] in Treatment: 3 Active Problems Location of Pain Severity and Description of Pain Patient Has Paino Yes Site Locations Pain LocationREDENA, BUGAJ ([id number]) [id number] Page 5 of 9 Pain Location: Pain in Ulcers Duration of the Pain. Constant / Intermittento Intermittent Rate the pain. Current Pain Level:  3 Character of Pain Describe the Pain: Throbbing Pain Management and Medication Current Pain Management: Medication: Yes Electronic Signature(s) Signed: 12/23/2022 4:05:21 PM By: Samuella Bruin Entered By: Samuella Bruin on 12/23/2022 07:45:16 -------------------------------------------------------------------------------- Patient/Caregiver Education Details Patient Name: Date of Service: [name], [name] 6/18/2024andnbsp7:45 A M Medical Record Number: [id number] Patient Account Number: [id number] Date of Birth/Gender: Treating RN: [birth year] (82 y.o. Erin [name] Primary Care Physician: [name] Other Clinician: Referring Physician: Treating Physician/Extender: [name] in Treatment: 3 Education Assessment Education Provided To: Patient Education Topics Provided Wound/Skin Impairment: Methods: Explain/Verbal Responses: Reinforcements needed, State content correctly Electronic Signature(s) Signed: 12/23/2022 4:05:21 PM By: Samuella Bruin Entered By: Samuella Bruin on 12/23/2022 08:32:10 -------------------------------------------------------------------------------- Wound Assessment Details Patient Name: Date of Service: [name], [name] 12/23/2022 7:45 A AZMINA, HEINZMANN ([id number]) [id number] Page 6 of 9 Medical Record Number: [id number] Patient Account Number: [id number] Date of Birth/Sex: Treating RN: June 17, 1941 (82 y.o. 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, Medial Lower Leg Wound Open Wounding Event: Gradually Appeared Status: Date Acquired: 08/29/2022 Comorbid Cataracts, Arrhythmia, Hypertension, Peripheral Venous Weeks Of  Treatment: 3 History: Disease, Gout, Osteoarthritis Clustered Wound: No Photos Wound Measurements Length: (cm) 3 Width: (cm) 1.9 Depth: (cm) 0.1 Area: (cm) 4.477 Volume: (cm) 0.448 % Reduction in Area: 23.8% % Reduction in Volume: 23.7% Epithelialization: Small (1-33%) Tunneling: No Undermining: No Wound Description Classification: Full Thickness Without Exposed Support Structures Wound Margin: Distinct, outline attached Exudate Amount: Medium Exudate Type: Serosanguineous Exudate Color: red, brown Foul Odor After Cleansing: No Slough/Fibrino Yes Wound Bed Granulation Amount: Large (67-100%) Exposed Structure Granulation Quality: Red Fascia Exposed: No Necrotic Amount: Small (1-33%) Fat Layer (Subcutaneous Tissue) Exposed: Yes Necrotic Quality: Eschar, Adherent Slough Tendon Exposed: No Muscle Exposed: No Joint Exposed: No Bone Exposed: No Periwound Skin Texture Texture Color No Abnormalities Noted: Yes No Abnormalities Noted: Yes Moisture Temperature / Pain No Abnormalities Noted: Yes Temperature: No Abnormality Treatment Notes Wound #1 (Lower Leg) Wound Laterality: Left, Medial Cleanser Soap and Water Discharge Instruction: May shower and wash wound with dial antibacterial soap and water prior to dressing change. Wound Cleanser Discharge Instruction: Cleanse the wound with wound cleanser prior to applying a clean dressing using gauze sponges, not tissue or cotton balls. Peri-Wound Care Sween Lotion (Moisturizing lotion) Discharge Instruction: Apply moisturizing lotion as directed Topical SABIRIN, KRAVCHENKO ([id number]) [id number] Page 7 of 9 Primary Dressing IODOFLEX 0.9% Cadexomer Iodine Pad 4x6 cm Discharge Instruction: Apply to wound bed as instructed Secondary Dressing ABD Pad, 5x9 Discharge Instruction: Apply over primary dressing as directed. Woven Gauze Sponge, Non-Sterile 4x4 in Discharge Instruction: Apply over primary  dressing as directed. Secured With Compression Wrap Kerlix Roll 4.5x3.1 (in/yd) Discharge Instruction: Apply Kerlix and Coban compression as directed. Coban Self-Adherent Wrap 4x5 (in/yd) Discharge Instruction: Apply over Kerlix as directed. Compression Stockings Add-Ons Electronic Signature(s) Signed: 12/23/2022 12:13:42 PM By: Karie Schwalbe RN Signed: 12/23/2022 4:05:21 PM By: Samuella Bruin Entered By: Karie Schwalbe  on 12/23/2022 07:52:24 -------------------------------------------------------------------------------- Wound Assessment Details Patient Name: Date of Service: [name], [name] RBA RA N. 12/23/2022 7:45 A M Medical Record Number: [id number] Patient Account Number: [id number] Date of Birth/Sex: Treating RN: [birth year] (82 y.o. Erin [name] Primary Care Adler Alton: [name] Other Clinician: Referring Purcell Jungbluth: Treating Henna Derderian/Extender: [name], [name] in Treatment: 3 Wound Status Wound Number: 2 Primary Venous Leg Ulcer Etiology: Wound Location: Left, Proximal, Medial Lower Leg Wound Open Wounding Event: Blister Status: Date Acquired: 12/23/2022 Comorbid Cataracts, Arrhythmia, Hypertension, Peripheral Venous Weeks Of Treatment: 0 History: Disease, Gout, Osteoarthritis Clustered Wound: No Photos Wound Measurements Length: (cm) 0.4 Width: (cm) 0.5 Depth: (cm) 0.1 Area: (cm) 0.157 Volume: (cm) 0.016 % Reduction in Area: % Reduction in Volume: Epithelialization: None Tunneling: No Undermining: No Wound Description LOVELEEN, PELLAND ([id number]) Classification: Full Thickness Without Exposed Support Structures Wound Margin: Distinct, outline attached Exudate Amount: Medium Exudate Type: Serosanguineous Exudate Color: red, brown [id number] Page 8 of 9 Foul Odor After Cleansing: No Slough/Fibrino Yes Wound Bed Granulation Amount: Large (67-100%) Exposed Structure Granulation  Quality: Red Fascia Exposed: No Necrotic Amount: Small (1-33%) Fat Layer (Subcutaneous Tissue) Exposed: Yes Necrotic Quality: Adherent Slough Tendon Exposed: No Muscle Exposed: No Joint Exposed: No Bone Exposed: No Periwound Skin Texture Texture Color No Abnormalities Noted: Yes No Abnormalities Noted: Yes Moisture Temperature / Pain No Abnormalities Noted: Yes Temperature: No Abnormality Treatment Notes Wound #2 (Lower Leg) Wound Laterality: Left, Medial, Proximal Cleanser Soap and Water Discharge Instruction: May shower and wash wound with dial antibacterial soap and water prior to dressing change. Wound Cleanser Discharge Instruction: Cleanse the wound with wound cleanser prior to applying a clean dressing using gauze sponges, not tissue or cotton balls. Peri-Wound Care Sween Lotion (Moisturizing lotion) Discharge Instruction: Apply moisturizing lotion as directed Topical Primary Dressing Maxorb Extra Ag+ Alginate Dressing, 2x2 (in/in) Discharge Instruction: Apply to wound bed as instructed Secondary Dressing ABD Pad, 5x9 Discharge Instruction: Apply over primary dressing as directed. Woven Gauze Sponge, Non-Sterile 4x4 in Discharge Instruction: Apply over primary dressing as directed. Secured With Compression Wrap Kerlix Roll 4.5x3.1 (in/yd) Discharge Instruction: Apply Kerlix and Coban compression as directed. Coban Self-Adherent Wrap 4x5 (in/yd) Discharge Instruction: Apply over Kerlix as directed. Compression Stockings Add-Ons Electronic Signature(s) Signed: 12/23/2022 12:13:42 PM By: Karie Schwalbe RN Signed: 12/23/2022 4:05:21 PM By: Samuella Bruin Entered By: Karie Schwalbe on 12/23/2022 07:52:59 Vitals Details -------------------------------------------------------------------------------- [name] ([id number]) [id number] Page 9 of 9 Patient Name: Date of Service: [name], [name] 12/23/2022 7:45 A M Medical  Record Number: [id number] Patient Account Number: [id number] Date of Birth/Sex: Treating RN: Jul 04, 1941 (82 y.o. Erin [name] Primary Care Jarae Panas: [name] Other Clinician: Referring Baylin Gamblin: Treating Kioni Stahl/Extender: [name], [name] in Treatment: 3 Vital Signs Time Taken: 07:44 Temperature (F): 98.3 Height (in): 60 Pulse (bpm): 78 Weight (lbs): 138 Respiratory Rate (breaths/min): 18 Body Mass Index (BMI): 26.9 Blood Pressure (mmHg): 190/83 Reference Range: 80 - 120 mg / dl Electronic Signature(s) Signed: 12/23/2022 4:05:21 PM By: Samuella Bruin Entered By: Samuella Bruin on 12/23/2022 07:44:55

## 2022-12-23 NOTE — Progress Notes (Signed)
Erin [name], Erin [name] ([id number]) [id number] Page 1 of 10 Visit Report for 12/23/2022 Chief Complaint Document Details Patient Name: Date of Service: [name], [name] RBA RA N. 12/23/2022 7:45 A M Medical Record Number: [id number] Patient Account Number: [id number] Date of Birth/Sex: Treating RN: [birth year] (82 y.o. F) Primary Care Provider: [name] Other Clinician: Referring Provider: Treating Provider/Extender: [name] Weeks in Treatment: 3 Information Obtained from: Patient Chief Complaint Patient presents for treatment of an open ulcer due to venous insufficiency Electronic Signature(s) Signed: 12/23/2022 8:05:19 AM By: Duanne Guess MD FACS Entered By: Duanne Guess on 12/23/2022 08:05:18 -------------------------------------------------------------------------------- Debridement Details Patient Name: Date of Service: [name], [name] 12/23/2022 7:45 A M Medical Record Number: [id number] Patient Account Number: [id number] Date of Birth/Sex: Treating RN: [birth year] (82 y.o. Erin [name] Primary Care Provider: [name] Other Clinician: Referring Provider: Treating Provider/Extender: [name] Weeks in Treatment: 3 Debridement Performed for Assessment: Wound #2 Left,Proximal,Medial Lower Leg Performed By: Physician Duanne Guess, MD Debridement Type: Debridement Severity of Tissue Pre Debridement: Fat layer exposed Level of Consciousness (Pre-procedure): Awake and Alert Pre-procedure Verification/Time Out Yes - 07:58 Taken: Start Time: 07:58 Pain Control: Lidocaine 5% topical ointment Percent of Wound Bed Debrided: 100% T Area Debrided (cm): otal 0.16 Tissue and other material debrided: Non-Viable, Slough, Slough Level: Non-Viable Tissue Debridement Description: Selective/Open Wound Instrument: Curette Bleeding: Minimum Hemostasis Achieved:  Pressure Response to Treatment: Procedure was tolerated well Level of Consciousness (Post- Awake and Alert procedure): Post Debridement Measurements of Total Wound Length: (cm) 0.4 Width: (cm) 0.5 Depth: (cm) 0.1 Volume: (cm) 0.016 Character of Wound/Ulcer Post Debridement: Improved Severity of Tissue Post Debridement: Fat layer exposed Post Procedure Diagnosis Erin [name] ([id number]) [id number] Page 2 of 10 Same as Pre-procedure Notes scribed for Dr. Lady Gary by Samuella Bruin, RN Electronic Signature(s) Signed: 12/23/2022 9:06:34 AM By: Duanne Guess MD FACS Signed: 12/23/2022 4:05:21 PM By: Samuella Bruin Entered By: Samuella Bruin on 12/23/2022 07:59:14 -------------------------------------------------------------------------------- Debridement Details Patient Name: Date of Service: [name], [name] 12/23/2022 7:45 A M Medical Record Number: [id number] Patient Account Number: [id number] Date of Birth/Sex: Treating RN: [birth year] (82 y.o. Erin [name] Primary Care Provider: [name] Other Clinician: Referring Provider: Treating Provider/Extender: [name] Weeks in Treatment: 3 Debridement Performed for Assessment: Wound #1 Left,Medial Lower Leg Performed By: Physician Duanne Guess, MD Debridement Type: Debridement Severity of Tissue Pre Debridement: Fat layer exposed Level of Consciousness (Pre-procedure): Awake and Alert Pre-procedure Verification/Time Out Yes - 07:58 Taken: Start Time: 07:58 Pain Control: Lidocaine 5% topical ointment Percent of Wound Bed Debrided: 100% T Area Debrided (cm): otal 4.47 Tissue and other material debrided: Non-Viable, Slough, Slough Level: Non-Viable Tissue Debridement Description: Selective/Open Wound Instrument: Curette Bleeding: Minimum Hemostasis Achieved: Pressure Response to Treatment: Procedure was tolerated well Level of  Consciousness (Post- Awake and Alert procedure): Post Debridement Measurements of Total Wound Length: (cm) 3 Width: (cm) 1.9 Depth: (cm) 0.1 Volume: (cm) 0.448 Character of Wound/Ulcer Post Debridement: Improved Severity of Tissue Post Debridement: Fat layer exposed Post Procedure Diagnosis Same as Pre-procedure Notes scribed for Dr. Lady Gary by Samuella Bruin, RN Electronic Signature(s) Signed: 12/23/2022 9:06:34 AM By: Duanne Guess MD FACS Signed: 12/23/2022 4:05:21 PM By: Samuella Bruin Entered By: Samuella Bruin on 12/23/2022 08:10:45 Erin [name] ([id number]) [id number] Page 3 of 10 -------------------------------------------------------------------------------- HPI Details Patient Name: Date of Service: [name], [name]  12/23/2022 7:45 A M Medical Record Number: [id number] Patient Account Number: [id number] Date of Birth/Sex: Treating RN: 10-27-1940 (82 y.o. F) Primary Care Provider: [name] Other Clinician: Referring Provider: Treating Provider/Extender: [name] Weeks in Treatment: 3 History of Present Illness HPI Description: ADMISSION 11/26/2022 This is an 82 year old woman who was referred by her primary care provider for a persistent nonhealing wound on her left lower extremity. It has been present since February of this year. 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 I do not see any formal venous reflux studies in the electronic medical record. She is not diabetic. She does not smoke. Her PCP has been washing her leg each week and applying a Kerlix and Coban wrap, but has not performed any formal debridement and has not been using any sort of topical contact layer or ointment. ABI in clinic was noncompressible, but she has a palpable pedal pulse. 12/10/2022: The wound is a bit smaller and  cleaner today. There is still fairly heavy slough on the surface. Edema control is acceptable. 12/16/2022: The wound is measuring the slightest bit smaller today. There is still thick slough on the surface. Edema control is good. 12/23/2022: The wound is a little bit smaller again today. Still with fairly heavy slough accumulation. Edema control is good. She has a new small wound on the proximal portion of the same leg. It looks as though she may have developed a small blister over the weekend subsequently ruptured. There is minimal slough on the surface. Electronic Signature(s) Signed: 12/23/2022 8:06:12 AM By: Duanne Guess MD FACS Entered By: Duanne Guess on 12/23/2022 08:06:12 -------------------------------------------------------------------------------- Physical Exam Details Patient Name: Date of Service: [name], [name] 12/23/2022 7:45 A M Medical Record Number: [id number] Patient Account Number: [id number] Date of Birth/Sex: Treating RN: Jul 11, 1940 (82 y.o. F) Primary Care Provider: [name] Other Clinician: Referring Provider: Treating Provider/Extender: [name], [name] in Treatment: 3 Constitutional Hypertensive, asymptomatic. . . . no acute distress. Respiratory Normal work of breathing on room air. Notes 12/23/2022: The wound is a little bit smaller again today. Still with fairly heavy slough accumulation. Edema control is good. She has a new small wound on the proximal portion of the same leg. It looks as though she may have developed a small blister over the weekend subsequently ruptured. There is minimal slough on the surface. Electronic Signature(s) Signed: 12/23/2022 8:13:50 AM By: Duanne Guess MD FACS Previous Signature: 12/23/2022 8:06:50 AM Version By: Duanne Guess MD FACS Entered By: Duanne Guess on 12/23/2022 08:13:50 -------------------------------------------------------------------------------- Physician  Orders Details Patient Name: Date of Service: [name], [name] 12/23/2022 7:45 A Lexine Baton ([id number]) [id number] Page 4 of 10 Medical Record Number: [id number] Patient Account Number: [id number] Date of Birth/Sex: Treating RN: 1941-03-07 (82 y.o. Erin [name] Primary Care Provider: [name] Other Clinician: Referring Provider: Treating Provider/Extender: [name] Weeks in Treatment: 3 Verbal / Phone Orders: No Diagnosis Coding ICD-10 Coding Code Description [id number] Non-pressure chronic ulcer of other part of left lower leg with fat layer exposed I87.332 Chronic venous hypertension (idiopathic) with ulcer and inflammation of left lower extremity I10 Essential (primary) hypertension Follow-up Appointments ppointment in 1 week. - Dr. Lady Gary - room 2 Return A Anesthetic (In clinic) Topical Lidocaine 5% applied to wound bed Bathing/ Shower/ Hygiene May shower with protection  but do not get wound dressing(s) wet. Protect dressing(s) with water repellant cover (for example, large plastic bag) or a cast cover and may then take shower. Edema Control - Lymphedema / SCD / Other Elevate legs to the level of the heart or above for 30 minutes daily and/or when sitting for 3-4 times a day throughout the day. Avoid standing for long periods of time. Wound Treatment Wound #1 - Lower Leg Wound Laterality: Left, Medial Cleanser: Soap and Water 1 x Per Week/30 Days Discharge Instructions: May shower and wash wound with dial antibacterial soap and water prior to dressing change. Cleanser: Wound Cleanser 1 x Per Week/30 Days Discharge Instructions: Cleanse the wound with wound cleanser prior to applying a clean dressing using gauze sponges, not tissue or cotton balls. Peri-Wound Care: Sween Lotion (Moisturizing lotion) 1 x Per Week/30 Days Discharge Instructions: Apply moisturizing lotion as directed Prim  Dressing: IODOFLEX 0.9% Cadexomer Iodine Pad 4x6 cm 1 x Per Week/30 Days ary Discharge Instructions: Apply to wound bed as instructed Secondary Dressing: ABD Pad, 5x9 1 x Per Week/30 Days Discharge Instructions: Apply over primary dressing as directed. Secondary Dressing: Woven Gauze Sponge, Non-Sterile 4x4 in 1 x Per Week/30 Days Discharge Instructions: Apply over primary dressing as directed. Compression Wrap: Kerlix Roll 4.5x3.1 (in/yd) 1 x Per Week/30 Days Discharge Instructions: Apply Kerlix and Coban compression as directed. Compression Wrap: Coban Self-Adherent Wrap 4x5 (in/yd) 1 x Per Week/30 Days Discharge Instructions: Apply over Kerlix as directed. Wound #2 - Lower Leg Wound Laterality: Left, Medial, Proximal Cleanser: Soap and Water 1 x Per Week/30 Days Discharge Instructions: May shower and wash wound with dial antibacterial soap and water prior to dressing change. Cleanser: Wound Cleanser 1 x Per Week/30 Days Discharge Instructions: Cleanse the wound with wound cleanser prior to applying a clean dressing using gauze sponges, not tissue or cotton balls. Peri-Wound Care: Sween Lotion (Moisturizing lotion) 1 x Per Week/30 Days Discharge Instructions: Apply moisturizing lotion as directed Prim Dressing: Maxorb Extra Ag+ Alginate Dressing, 2x2 (in/in) 1 x Per Week/30 Days ary Discharge Instructions: Apply to wound bed as instructed Secondary Dressing: ABD Pad, 5x9 1 x Per Week/30 Days Discharge Instructions: Apply over primary dressing as directed. Secondary Dressing: Woven Gauze Sponge, Non-Sterile 4x4 in 1 x Per Week/30 Days Discharge Instructions: Apply over primary dressing as directed. Compression Wrap: Kerlix Roll 4.5x3.1 (in/yd) 1 x Per Week/30 Days Discharge Instructions: Apply Kerlix and Coban compression as directed. Erin [name], Erin [name] ([id number]) [id number] Page 5 of 10 Compression Wrap: Coban Self-Adherent Wrap 4x5 (in/yd) 1 x Per Week/30  Days Discharge Instructions: Apply over Kerlix as directed. Patient Medications llergies: codeine, morphine A Notifications Medication Indication Start End 12/23/2022 lidocaine DOSE topical 4 % cream - cream topical Electronic Signature(s) Signed: 12/23/2022 9:06:34 AM By: Duanne Guess MD FACS Entered By: Duanne Guess on 12/23/2022 08:14:06 -------------------------------------------------------------------------------- Problem List Details Patient Name: Date of Service: [name], [name] 12/23/2022 7:45 A M Medical Record Number: [id number] Patient Account Number: [id number] Date of Birth/Sex: Treating RN: Oct 17, 1940 (82 y.o. F) Primary Care Provider: [name] Other Clinician: Referring Provider: Treating Provider/Extender: [name] Weeks in Treatment: 3 Active Problems ICD-10 Encounter Code Description Active Date MDM Diagnosis L97.822 Non-pressure chronic ulcer of other part of left lower leg with fat layer exposed5/22/2024 No Yes I87.332 Chronic venous hypertension (idiopathic) with ulcer and inflammation of left 11/26/2022 No Yes lower extremity I10 Essential (primary) hypertension 11/26/2022 No Yes Inactive Problems Resolved Problems  Electronic Signature(s) Signed: 12/23/2022 8:05:07 AM By: Duanne Guess MD FACS Entered By: Duanne Guess on 12/23/2022 08:05:07 -------------------------------------------------------------------------------- Progress Note Details Patient Name: Date of Service: [name], [name] 12/23/2022 7:45 A Erin [name], Erin [name] ([id number]) [phone].pdf Page 6 of 10 Medical Record Number: [id number] Patient Account Number: [id number] Date of Birth/Sex: Treating RN: 24-May-1941 (82 y.o. F) Primary Care Provider: [name] Other Clinician: Referring Provider: Treating Provider/Extender: [name] Weeks in Treatment: 3 Subjective Chief  Complaint Information obtained from Patient Patient presents for treatment of an open ulcer due to venous insufficiency History of Present Illness (HPI) ADMISSION 11/26/2022 This is an 82 year old woman who was referred by her primary care provider for a persistent nonhealing wound on her left lower extremity. It has been present since February of this year. 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 I do not see any formal venous reflux studies in the electronic medical record. She is not diabetic. She does not smoke. Her PCP has been washing her leg each week and applying a Kerlix and Coban wrap, but has not performed any formal debridement and has not been using any sort of topical contact layer or ointment. ABI in clinic was noncompressible, but she has a palpable pedal pulse. 12/10/2022: The wound is a bit smaller and cleaner today. There is still fairly heavy slough on the surface. Edema control is acceptable. 12/16/2022: The wound is measuring the slightest bit smaller today. There is still thick slough on the surface. Edema control is good. 12/23/2022: The wound is a little bit smaller again today. Still with fairly heavy slough accumulation. Edema control is good. She has a new small wound on the proximal portion of the same leg. It looks as though she may have developed a small blister over the weekend subsequently ruptured. There is minimal slough on the surface. Patient History Family History Cancer - Siblings, Lung Disease - Siblings, No family history of Diabetes, Heart Disease, Hereditary Spherocytosis, Hypertension, Kidney Disease, Seizures, Stroke, Thyroid Problems, Tuberculosis. Social History Never smoker, Marital Status - Widowed, Alcohol Use - Never, Drug Use - No History, Caffeine Use - Moderate. Medical History Eyes Patient has history of Cataracts Cardiovascular Patient has  history of Arrhythmia - bundle branch block, Hypertension, Peripheral Venous Disease Musculoskeletal Patient has history of Gout, Osteoarthritis Hospitalization/Surgery History - Salpingoophorectomy. - Colon resection. - Appendectomy. - Cystoscopy w/ retrogrades. - Joint replacement. - Mastectomy right. - gastric ulcer repair. - Abdominal hysterectomy. Medical A Surgical History Notes nd Hematologic/Lymphatic thrombocytopenia Gastrointestinal diverticulosis, GERD Endocrine hypothyroidism Genitourinary CKD stage III Musculoskeletal DJD, osteoporosis Oncologic breast cancer Objective Constitutional Hypertensive, asymptomatic. no acute distress. Vitals Time Taken: 7:44 AM, Height: 60 in, Weight: 138 lbs, BMI: 26.9, Temperature: 98.3 F, Pulse: 78 bpm, Respiratory Rate: 18 breaths/min, Blood Pressure: 190/83 mmHg. Respiratory Normal work of breathing on room air. General Notes: 12/23/2022: The wound is a little bit smaller again today. Still with fairly heavy slough accumulation. Edema control is good. She has a new small Erin [name], Erin [name] ([id number]) [id number] Page 7 of 10 wound on the proximal portion of the same leg. It looks as though she may have developed a small blister over the weekend subsequently ruptured. There is minimal slough on the surface. Integumentary (Hair, Skin) Wound #1 status is Open. Original cause of wound was Gradually Appeared. The date acquired was: 08/29/2022. The wound has been in  treatment 3 weeks. The wound is located on the Left,Medial Lower Leg. The wound measures 3cm length x 1.9cm width x 0.1cm depth; 4.477cm^2 area and 0.448cm^3 volume. There is Fat Layer (Subcutaneous Tissue) exposed. There is no tunneling or undermining noted. There is a medium amount of serosanguineous drainage noted. The wound margin is distinct with the outline attached to the wound base. There is large (67-100%) red granulation within the wound bed.  There is a small (1-33%) amount of necrotic tissue within the wound bed including Eschar and Adherent Slough. The periwound skin appearance had no abnormalities noted for texture. The periwound skin appearance had no abnormalities noted for moisture. The periwound skin appearance had no abnormalities noted for color. Periwound temperature was noted as No Abnormality. Wound #2 status is Open. Original cause of wound was Blister. The date acquired was: 12/23/2022. The wound is located on the Left,Proximal,Medial Lower Leg. The wound measures 0.4cm length x 0.5cm width x 0.1cm depth; 0.157cm^2 area and 0.016cm^3 volume. There is Fat Layer (Subcutaneous Tissue) exposed. There is no tunneling or undermining noted. There is a medium amount of serosanguineous drainage noted. The wound margin is distinct with the outline attached to the wound base. There is large (67-100%) red granulation within the wound bed. There is a small (1-33%) amount of necrotic tissue within the wound bed including Adherent Slough. The periwound skin appearance had no abnormalities noted for texture. The periwound skin appearance had no abnormalities noted for moisture. The periwound skin appearance had no abnormalities noted for color. Periwound temperature was noted as No Abnormality. 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 There was a Selective/Open Wound Non-Viable Tissue Debridement with a total area of 4.47 sq cm performed by Duanne Guess, MD. With the following instrument(s): Curette to remove Non-Viable tissue/material. Material removed includes  [facility] after achieving pain control using Lidocaine 5% topical ointment. No specimens were taken. A time out was conducted at 07:58, prior to the start of the procedure. A Minimum amount of bleeding was controlled with Pressure. The procedure was tolerated well. Post Debridement Measurements: 3cm length x 1.9cm width x 0.1cm depth; 0.448cm^3 volume. Character of Wound/Ulcer Post Debridement is improved. Severity of Tissue Post Debridement is: Fat layer exposed. Post procedure Diagnosis Wound #1: Same as Pre-Procedure General Notes: scribed for Dr. Lady Gary by Samuella Bruin, RN. Wound #2 Pre-procedure diagnosis of Wound #2 is a Venous Leg Ulcer located on the Left,Proximal,Medial Lower Leg .Severity of Tissue Pre Debridement is: Fat layer exposed. There was a Selective/Open Wound Non-Viable Tissue Debridement with a total area of 0.16 sq cm performed by Duanne Guess, MD. With the following instrument(s): Curette to remove Non-Viable tissue/material. Material removed includes [facility] after achieving pain control using Lidocaine 5% topical ointment. No specimens were taken. A time out was conducted at 07:58, prior to the start of the procedure. A Minimum amount of bleeding was controlled with Pressure. The procedure was tolerated well. Post Debridement Measurements: 0.4cm length x 0.5cm width x 0.1cm depth; 0.016cm^3 volume. Character of Wound/Ulcer Post Debridement is improved. Severity of Tissue Post Debridement is: Fat layer exposed. Post procedure Diagnosis Wound #2: Same as Pre-Procedure General Notes: scribed for Dr. Lady Gary by  Samuella Bruin, RN. Plan Follow-up Appointments: Return Appointment in 1 week. - Dr. Lady Gary - room 2 Anesthetic: (In clinic) Topical Lidocaine 5% applied to wound bed Bathing/ Shower/ Hygiene: May shower with protection but do not get wound dressing(s) wet. Protect dressing(s) with water repellant cover (for example, large plastic bag) or a cast cover and may  then take shower. Edema Control - Lymphedema / SCD / Other: Elevate legs to the level of the heart or above for 30 minutes daily and/or when sitting for 3-4 times a day throughout the day. Avoid standing for long periods of time. The following medication(s) was prescribed: lidocaine topical 4 % cream cream topical was prescribed at facility WOUND #1: - Lower Leg Wound Laterality: Left, Medial Cleanser: Soap and Water 1 x Per Week/30 Days Discharge Instructions: May shower and wash wound with dial antibacterial soap and water prior to dressing change. Cleanser: Wound Cleanser 1 x Per Week/30 Days Discharge Instructions: Cleanse the wound with wound cleanser prior to applying a clean dressing using gauze sponges, not tissue or cotton balls. Peri-Wound Care: Sween Lotion (Moisturizing lotion) 1 x Per Week/30 Days Discharge Instructions: Apply moisturizing lotion as directed Prim Dressing: IODOFLEX 0.9% Cadexomer Iodine Pad 4x6 cm 1 x Per Week/30 Days ary Discharge Instructions: Apply to wound bed as instructed Secondary Dressing: ABD Pad, 5x9 1 x Per Week/30 Days Discharge Instructions: Apply over primary dressing as directed. Secondary Dressing: Woven Gauze Sponge, Non-Sterile 4x4 in 1 x Per Week/30 Days Erin [name], Erin [name] ([id number]) [phone].pdf Page 8 of 10 Discharge Instructions: Apply over primary dressing as directed. Com pression Wrap: Kerlix Roll 4.5x3.1 (in/yd) 1 x Per Week/30 Days Discharge Instructions: Apply Kerlix and Coban compression as directed. Com pression Wrap: Coban Self-Adherent Wrap 4x5 (in/yd) 1 x Per Week/30 Days Discharge Instructions: Apply over Kerlix as directed. WOUND #2: - Lower Leg Wound Laterality: Left, Medial, Proximal Cleanser: Soap and Water 1 x Per Week/30 Days Discharge Instructions: May shower and wash wound with dial antibacterial soap and water prior to dressing change. Cleanser: Wound Cleanser 1 x Per Week/30 Days Discharge  Instructions: Cleanse the wound with wound cleanser prior to applying a clean dressing using gauze sponges, not tissue or cotton balls. Peri-Wound Care: Sween Lotion (Moisturizing lotion) 1 x Per Week/30 Days Discharge Instructions: Apply moisturizing lotion as directed Prim Dressing: Maxorb Extra Ag+ Alginate Dressing, 2x2 (in/in) 1 x Per Week/30 Days ary Discharge Instructions: Apply to wound bed as instructed Secondary Dressing: ABD Pad, 5x9 1 x Per Week/30 Days Discharge Instructions: Apply over primary dressing as directed. Secondary Dressing: Woven Gauze Sponge, Non-Sterile 4x4 in 1 x Per Week/30 Days Discharge Instructions: Apply over primary dressing as directed. Com pression Wrap: Kerlix Roll 4.5x3.1 (in/yd) 1 x Per Week/30 Days Discharge Instructions: Apply Kerlix and Coban compression as directed. Com pression Wrap: Coban Self-Adherent Wrap 4x5 (in/yd) 1 x Per Week/30 Days Discharge Instructions: Apply over Kerlix as directed. 12/23/2022: The wound is a little bit smaller again today. Still with fairly heavy slough accumulation. Edema control is good. She has a new small wound on the proximal portion of the same leg. It looks as though she may have developed a small blister over the weekend subsequently ruptured. There is minimal slough on the surface. I used a curette to debride slough off of both of her wounds. I am going to use silver alginate on her new wound and continue Iodoflex again on her existing wound. Continue Kerlix and Coban compression. Follow-up in 1 week. Electronic  Signature(s) Signed: 12/23/2022 8:15:01 AM By: Duanne Guess MD FACS Entered By: Duanne Guess on 12/23/2022 08:15:00 -------------------------------------------------------------------------------- HxROS Details Patient Name: Date of Service: [name], [name] 12/23/2022 7:45 A M Medical Record Number: [id number] Patient Account Number: [id number] Date of Birth/Sex: Treating RN: [birth year]  (82 y.o. F) Primary Care Provider: [name] Other Clinician: Referring Provider: Treating Provider/Extender: [name], [name] in Treatment: 3 Eyes Medical History: Positive for: Cataracts Hematologic/Lymphatic Medical History: Past Medical History Notes: thrombocytopenia Cardiovascular Medical History: Positive for: Arrhythmia - bundle branch block; Hypertension; Peripheral Venous Disease Gastrointestinal Medical History: Past Medical History Notes: diverticulosis, GERD Endocrine Medical History: Past Medical History NotesMarland Kitchen Erin [name], Erin [name] ([id number]) [id number] Page 9 of 10 hypothyroidism Genitourinary Medical History: Past Medical History Notes: CKD stage III Musculoskeletal Medical History: Positive for: Gout; Osteoarthritis Past Medical History Notes: DJD, osteoporosis Oncologic Medical History: Past Medical History Notes: breast cancer HBO Extended History Items Eyes: Cataracts Immunizations Pneumococcal Vaccine: Received Pneumococcal Vaccination: Yes Received Pneumococcal Vaccination On or After 60th Birthday: No Implantable Devices No devices added Hospitalization / Surgery History Type of Hospitalization/Surgery Salpingoophorectomy Colon resection Appendectomy Cystoscopy w/ retrogrades Joint replacement Mastectomy right gastric ulcer repair Abdominal hysterectomy Family and Social History Cancer: Yes - Siblings; Diabetes: No; Heart Disease: No; Hereditary Spherocytosis: No; Hypertension: No; Kidney Disease: No; Lung Disease: Yes - Siblings; Seizures: No; Stroke: No; Thyroid Problems: No; Tuberculosis: No; Never smoker; Marital Status - Widowed; Alcohol Use: Never; Drug Use: No History; Caffeine Use: Moderate; Financial Concerns: No; Food, Clothing or Shelter Needs: No; Support System Lacking: No; Transportation Concerns: No Electronic Signature(s) Signed: 12/23/2022 9:06:34 AM By:  Duanne Guess MD FACS Entered By: Duanne Guess on 12/23/2022 08:06:41 -------------------------------------------------------------------------------- SuperBill Details Patient Name: Date of Service: [name], [name] 12/23/2022 Medical Record Number: [id number] Patient Account Number: [id number] Date of Birth/Sex: Treating RN: 03/30/1941 (82 y.o. 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 I10 Essential (primary) hypertension Facility Procedures : CPT4 Code: [id number] Description: 97597 - DEBRIDE WOUND 1ST 20 SQ CM OR < ICD-10 Diagnosis Description L97.822 Non-pressure chronic ulcer of other part of left lower leg with fat layer exposed Modifier: Quantity: 1 Physician Procedures : CPT4 Code Description Modifier [id number] - WC PHYS LEVEL 4 - EST PT 25 ICD-10 Diagnosis Description L97.822 Non-pressure chronic ulcer of other part of left lower leg with fat layer exposed I87.332 Chronic venous hypertension (idiopathic) with  ulcer and inflammation of left lower extremity I10 Essential (primary) hypertension Quantity: 1 : [id number] - WC PHYS DEBR WO ANESTH 20 SQ CM ICD-10 Diagnosis Description L97.822 Non-pressure chronic ulcer of other part of left lower leg with fat layer exposed Quantity: 1 Electronic Signature(s) Signed: 12/23/2022 8:15:21 AM By: Duanne Guess MD FACS Entered By: Duanne Guess on 12/23/2022 08:15:20

## 2022-12-30 ENCOUNTER — Encounter (HOSPITAL_BASED_OUTPATIENT_CLINIC_OR_DEPARTMENT_OTHER): Payer: Medicare HMO | Admitting: General Surgery

## 2022-12-30 DIAGNOSIS — M1990 Unspecified osteoarthritis, unspecified site: Secondary | ICD-10-CM | POA: Diagnosis not present

## 2022-12-30 DIAGNOSIS — E039 Hypothyroidism, unspecified: Secondary | ICD-10-CM | POA: Diagnosis not present

## 2022-12-30 DIAGNOSIS — I87332 Chronic venous hypertension (idiopathic) with ulcer and inflammation of left lower extremity: Secondary | ICD-10-CM | POA: Diagnosis not present

## 2022-12-30 DIAGNOSIS — N183 Chronic kidney disease, stage 3 unspecified: Secondary | ICD-10-CM | POA: Diagnosis not present

## 2022-12-30 DIAGNOSIS — I872 Venous insufficiency (chronic) (peripheral): Secondary | ICD-10-CM | POA: Diagnosis not present

## 2022-12-30 DIAGNOSIS — I129 Hypertensive chronic kidney disease with stage 1 through stage 4 chronic kidney disease, or unspecified chronic kidney disease: Secondary | ICD-10-CM | POA: Diagnosis not present

## 2022-12-30 DIAGNOSIS — L97822 Non-pressure chronic ulcer of other part of left lower leg with fat layer exposed: Secondary | ICD-10-CM | POA: Diagnosis not present

## 2022-12-30 DIAGNOSIS — M109 Gout, unspecified: Secondary | ICD-10-CM | POA: Diagnosis not present

## 2022-12-30 NOTE — Progress Notes (Signed)
Erin [name], Erin [name] ([id number]) [id number] Page 1 of 10 Visit Report for 12/30/2022 Chief Complaint Document Details Patient Name: Date of Service: [name], [name] RBA RA N. 12/30/2022 10:45 A M Medical Record Number: [id number] Patient Account Number: [id number] Date of Birth/Sex: Treating RN: 02/24/1941 (82 y.o. F) Primary Care Provider: Hoyle [name] Other Clinician: Referring Provider: Treating Provider/Extender: Erin [name] Weeks in Treatment: 4 Information Obtained from: Patient Chief Complaint Patient presents for treatment of an open ulcer due to venous insufficiency Electronic Signature(s) Signed: 12/30/2022 11:19:24 AM By: Duanne Guess MD FACS Entered By: Duanne Guess on 12/30/2022 11:19:24 -------------------------------------------------------------------------------- Debridement Details Patient Name: Date of Service: [name], [name] 12/30/2022 10:45 A M Medical Record Number: [id number] Patient Account Number: [id number] Date of Birth/Sex: Treating RN: 01-12-1941 (82 y.o. Erin [name] Primary Care Provider: Hoyle [name] Other Clinician: Referring Provider: Treating Provider/Extender: Erin [name] Weeks in Treatment: 4 Debridement Performed for Assessment: Wound #1 Left,Medial Lower Leg Performed By: Physician Duanne Guess, MD Debridement Type: Debridement Severity of Tissue Pre Debridement: Fat layer exposed Level of Consciousness (Pre-procedure): Awake and Alert Pre-procedure Verification/Time Out Yes - 11:02 Taken: Start Time: 11:02 Pain Control: Lidocaine 4% T opical Solution Percent of Wound Bed Debrided: 100% T Area Debrided (cm): otal 5.6 Tissue and other material debrided: Non-Viable, Slough, Slough Level: Non-Viable Tissue Debridement Description: Selective/Open Wound Instrument: Curette Bleeding: Minimum Hemostasis Achieved: Pressure Response to  Treatment: Procedure was tolerated well Level of Consciousness (Post- Awake and Alert procedure): Post Debridement Measurements of Total Wound Length: (cm) 3.4 Width: (cm) 2.1 Depth: (cm) 0.1 Volume: (cm) 0.561 Character of Wound/Ulcer Post Debridement: Improved Severity of Tissue Post Debridement: Fat layer exposed Post Procedure Diagnosis Erin [name], Erin [name] ([id number]) [id number] Page 2 of 10 Same as Pre-procedure Notes scribed for Dr. Lady Gary by Erin Bruin, RN Electronic Signature(s) Signed: 12/30/2022 11:23:48 AM By: Duanne Guess MD FACS Signed: 12/30/2022 3:31:57 PM By: Erin [name] Entered By: Erin [name] on 12/30/2022 11:03:46 -------------------------------------------------------------------------------- Debridement Details Patient Name: Date of Service: [name], [name] 12/30/2022 10:45 A M Medical Record Number: [id number] Patient Account Number: [id number] Date of Birth/Sex: Treating RN: 07/23/1940 (82 y.o. Erin [name] Primary Care Provider: Hoyle [name] Other Clinician: Referring Provider: Treating Provider/Extender: Erin [name] Weeks in Treatment: 4 Debridement Performed for Assessment: Wound #3 Left,Lateral Lower Leg Performed By: Physician Duanne Guess, MD Debridement Type: Debridement Severity of Tissue Pre Debridement: Fat layer exposed Level of Consciousness (Pre-procedure): Awake and Alert Pre-procedure Verification/Time Out Yes - 11:02 Taken: Start Time: 11:02 Pain Control: Lidocaine 4% T opical Solution Percent of Wound Bed Debrided: 100% T Area Debrided (cm): otal 0.78 Tissue and other material debrided: Non-Viable, Slough, Skin: Epidermis, Slough Level: Skin/Epidermis Debridement Description: Selective/Open Wound Instrument: Curette Bleeding: Minimum Hemostasis Achieved: Pressure Response to Treatment: Procedure was tolerated well Level of  Consciousness (Post- Awake and Alert procedure): Post Debridement Measurements of Total Wound Length: (cm) 1 Width: (cm) 1 Depth: (cm) 0.1 Volume: (cm) 0.079 Character of Wound/Ulcer Post Debridement: Improved Severity of Tissue Post Debridement: Fat layer exposed Post Procedure Diagnosis Same as Pre-procedure Notes scribed for Dr. Lady Gary by Erin Bruin, RN Electronic Signature(s) Signed: 12/30/2022 11:23:48 AM By: Duanne Guess MD FACS Signed: 12/30/2022 3:31:57 PM By: Erin [name] Entered By: Erin [name] on 12/30/2022 11:05:45 Erin [name] ([id number]) [id number] Page 3 of 10 -------------------------------------------------------------------------------- HPI Details Patient Name: Date of Service: [name], BA  RBA RA N. 12/30/2022 10:45 A M Medical Record Number: [id number] Patient Account Number: [id number] Date of Birth/Sex: Treating RN: 1941/01/02 (82 y.o. F) Primary Care Provider: Hoyle [name] Other Clinician: Referring Provider: Treating Provider/Extender: Erin [name] Weeks in Treatment: 4 History of Present Illness HPI Description: ADMISSION 11/26/2022 This is an 82 year old woman who was referred by her primary care provider for a persistent nonhealing wound on her left lower extremity. It has been present since February of this year. 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 I do not see any formal venous reflux studies in the electronic medical record. She is not diabetic. She does not smoke. Her PCP has been washing her leg each week and applying a Kerlix and Coban wrap, but has not performed any formal debridement and has not been using any sort of topical contact layer or ointment. ABI in clinic was noncompressible, but she has a palpable pedal pulse. 12/10/2022: The wound is a bit smaller and  cleaner today. There is still fairly heavy slough on the surface. Edema control is acceptable. 12/16/2022: The wound is measuring the slightest bit smaller today. There is still thick slough on the surface. Edema control is good. 12/23/2022: The wound is a little bit smaller again today. Still with fairly heavy slough accumulation. Edema control is good. She has a new small wound on the proximal portion of the same leg. It looks as though she may have developed a small blister over the weekend subsequently ruptured. There is minimal slough on the surface. 12/30/2022: The wound is about the same size, but substantially cleaner. There is some hypertrophic granulation tissue starting to emerge. The wound that was new last week has closed but she has a different new wound today on the lateral aspect of her left lower leg. It also appears to have been a blister that opened and ruptured. There is a little bit of slough on the surface. Electronic Signature(s) Signed: 12/30/2022 11:20:17 AM By: Duanne Guess MD FACS Entered By: Duanne Guess on 12/30/2022 11:20:16 -------------------------------------------------------------------------------- Physical Exam Details Patient Name: Date of Service: [name], [name] 12/30/2022 10:45 A M Medical Record Number: [id number] Patient Account Number: [id number] Date of Birth/Sex: Treating RN: Jan 13, 1941 (82 y.o. F) Primary Care Provider: Hoyle [name] Other Clinician: Referring Provider: Treating Provider/Extender: [name], [name] in Treatment: 4 Constitutional Hypertensive, asymptomatic. . . . no acute distress. Respiratory Normal work of breathing on room air. Notes 12/30/2022: The wound is about the same size, but substantially cleaner. There is some hypertrophic granulation tissue starting to emerge. The wound that was new last week has closed but she has a different new wound today on the lateral aspect of her left  lower leg. It also appears to have been a blister that opened and ruptured. There is a little bit of slough on the surface. Electronic Signature(s) Signed: 12/30/2022 11:20:58 AM By: Duanne Guess MD FACS Entered By: Duanne Guess on 12/30/2022 11:20:57 Erin [name] ([id number]) [id number] Page 4 of 10 -------------------------------------------------------------------------------- Physician Orders Details Patient Name: Date of Service: [name], [name] RBA RA N. 12/30/2022 10:45 A M Medical Record Number: [id number] Patient Account Number: [id number] Date of Birth/Sex: Treating RN: 1940-08-13 (83 y.o. Erin [name] Primary Care Provider: Hoyle [name] Other Clinician: Referring Provider: Treating Provider/Extender: Erin [name] Weeks in Treatment: 4 Verbal / Phone  Orders: No Diagnosis Coding ICD-10 Coding Code Description [phone] Non-pressure chronic ulcer of other part of left lower leg with fat layer exposed I87.332 Chronic venous hypertension (idiopathic) with ulcer and inflammation of left lower extremity I10 Essential (primary) hypertension Follow-up Appointments ppointment in 1 week. - Dr. Lady Gary - room 2 Return A Anesthetic (In clinic) Topical Lidocaine 4% applied to wound bed Bathing/ Shower/ Hygiene May shower with protection but do not get wound dressing(s) wet. Protect dressing(s) with water repellant cover (for example, large plastic bag) or a cast cover and may then take shower. Edema Control - Lymphedema / SCD / Other Elevate legs to the level of the heart or above for 30 minutes daily and/or when sitting for 3-4 times a day throughout the day. Avoid standing for long periods of time. Wound Treatment Wound #1 - Lower Leg Wound Laterality: Left, Medial Cleanser: Soap and Water 1 x Per Week/30 Days Discharge Instructions: May shower and wash wound with dial antibacterial soap and water prior to  dressing change. Cleanser: Wound Cleanser 1 x Per Week/30 Days Discharge Instructions: Cleanse the wound with wound cleanser prior to applying a clean dressing using gauze sponges, not tissue or cotton balls. Peri-Wound Care: Sween Lotion (Moisturizing lotion) 1 x Per Week/30 Days Discharge Instructions: Apply moisturizing lotion as directed Prim Dressing: Hydrofera Blue Ready Transfer Foam, 2.5x2.5 (in/in) 1 x Per Week/30 Days ary Discharge Instructions: Apply directly to wound bed as directed Secondary Dressing: ABD Pad, 5x9 1 x Per Week/30 Days Discharge Instructions: Apply over primary dressing as directed. Secondary Dressing: Woven Gauze Sponge, Non-Sterile 4x4 in 1 x Per Week/30 Days Discharge Instructions: Apply over primary dressing as directed. Compression Wrap: Urgo K2 Lite, (equivalent to a 3 layer) two layer compression system, regular 1 x Per Week/30 Days Discharge Instructions: Apply Urgo K2 Lite as directed (alternative to 3 layer compression). Wound #3 - Lower Leg Wound Laterality: Left, Lateral Cleanser: Soap and Water 1 x Per Week/30 Days Discharge Instructions: May shower and wash wound with dial antibacterial soap and water prior to dressing change. Cleanser: Wound Cleanser 1 x Per Week/30 Days Discharge Instructions: Cleanse the wound with wound cleanser prior to applying a clean dressing using gauze sponges, not tissue or cotton balls. Peri-Wound Care: Sween Lotion (Moisturizing lotion) 1 x Per Week/30 Days Discharge Instructions: Apply moisturizing lotion as directed Prim Dressing: Maxorb Extra Ag+ Alginate Dressing, 2x2 (in/in) 1 x Per Week/30 Days ary Discharge Instructions: Apply to wound bed as instructed Secondary Dressing: Woven Gauze Sponge, Non-Sterile 4x4 in 1 x Per Week/30 Days Discharge Instructions: Apply over primary dressing as directed. Compression Wrap: Urgo K2 Lite, (equivalent to a 3 layer) two layer compression system, regular 1 x Per Week/30  Days Discharge Instructions: Apply Urgo K2 Lite as directed (alternative to 3 layer compression). Erin [name], Erin [name] ([id number]) [id number] Page 5 of 10 Patient Medications llergies: codeine, morphine A Notifications Medication Indication Start End 12/30/2022 lidocaine DOSE topical 4 % cream - cream topical Electronic Signature(s) Signed: 12/30/2022 11:23:48 AM By: Duanne Guess MD FACS Entered By: Duanne Guess on 12/30/2022 11:21:13 -------------------------------------------------------------------------------- Problem List Details Patient Name: Date of Service: [name], [name] 12/30/2022 10:45 A M Medical Record Number: [id number] Patient Account Number: [id number] Date of Birth/Sex: Treating RN: [birth year] (82 y.o. F) Primary Care Provider: Hoyle [name] Other Clinician: Referring Provider: Treating Provider/Extender: Erin [name] Weeks in Treatment: 4 Active Problems ICD-10 Encounter Code Description Active Date MDM Diagnosis L97.822 Non-pressure chronic  ulcer of other part of left lower leg with fat layer exposed5/22/2024 No Yes I87.332 Chronic venous hypertension (idiopathic) with ulcer and inflammation of left 11/26/2022 No Yes lower extremity I10 Essential (primary) hypertension 11/26/2022 No Yes Inactive Problems Resolved Problems Electronic Signature(s) Signed: 12/30/2022 11:19:02 AM By: Duanne Guess MD FACS Entered By: Duanne Guess on 12/30/2022 11:19:02 -------------------------------------------------------------------------------- Progress Note Details Patient Name: Date of Service: [name], [name] 12/30/2022 10:45 A M Medical Record Number: [id number] Patient Account Number: [id number] Date of Birth/Sex: Treating RN: January 26, 1941 (82 y.o. F) Primary Care Provider: Hoyle [name] Other Clinician: Referring Provider: Treating Provider/Extender: [name], Ravisankar  R Weeks in Treatment: [address] Erin [name], Erin [name] ([id number]) [id number] Page 6 of 10 Subjective Chief Complaint Information obtained from Patient Patient presents for treatment of an open ulcer due to venous insufficiency History of Present Illness (HPI) ADMISSION 11/26/2022 This is an 82 year old woman who was referred by her primary care provider for a persistent nonhealing wound on her left lower extremity. It has been present since February of this year. 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 I do not see any formal venous reflux studies in the electronic medical record. She is not diabetic. She does not smoke. Her PCP has been washing her leg each week and applying a Kerlix and Coban wrap, but has not performed any formal debridement and has not been using any sort of topical contact layer or ointment. ABI in clinic was noncompressible, but she has a palpable pedal pulse. 12/10/2022: The wound is a bit smaller and cleaner today. There is still fairly heavy slough on the surface. Edema control is acceptable. 12/16/2022: The wound is measuring the slightest bit smaller today. There is still thick slough on the surface. Edema control is good. 12/23/2022: The wound is a little bit smaller again today. Still with fairly heavy slough accumulation. Edema control is good. She has a new small wound on the proximal portion of the same leg. It looks as though she may have developed a small blister over the weekend subsequently ruptured. There is minimal slough on the surface. 12/30/2022: The wound is about the same size, but substantially cleaner. There is some hypertrophic granulation tissue starting to emerge. The wound that was new last week has closed but she has a different new wound today on the lateral aspect of her left lower leg. It also appears to have been a blister that  opened and ruptured. There is a little bit of slough on the surface. Patient History Family History Cancer - Siblings, Lung Disease - Siblings, No family history of Diabetes, Heart Disease, Hereditary Spherocytosis, Hypertension, Kidney Disease, Seizures, Stroke, Thyroid Problems, Tuberculosis. Social History Never smoker, Marital Status - Widowed, Alcohol Use - Never, Drug Use - No History, Caffeine Use - Moderate. Medical History Eyes Patient has history of Cataracts Cardiovascular Patient has history of Arrhythmia - bundle branch block, Hypertension, Peripheral Venous Disease Musculoskeletal Patient has history of Gout, Osteoarthritis Hospitalization/Surgery History - Salpingoophorectomy. - Colon resection. - Appendectomy. - Cystoscopy w/ retrogrades. - Joint replacement. - Mastectomy right. - gastric ulcer repair. - Abdominal hysterectomy. Medical A Surgical History Notes nd Hematologic/Lymphatic thrombocytopenia Gastrointestinal diverticulosis, GERD Endocrine hypothyroidism Genitourinary CKD stage III Musculoskeletal DJD, osteoporosis Oncologic breast cancer Objective Constitutional Hypertensive, asymptomatic. no acute distress. Vitals Time Taken: 10:41 AM, Height: 60 in, Weight: 138 lbs, BMI: 26.9, Temperature: 97.6 F, Pulse: 63 bpm,  Respiratory Rate: 18 breaths/min, Blood Pressure: 173/72 mmHg. Respiratory Normal work of breathing on room air. General Notes: 12/30/2022: The wound is about the same size, but substantially cleaner. There is some hypertrophic granulation tissue starting to emerge. The wound that was new last week has closed but she has a different new wound today on the lateral aspect of her left lower leg. It also appears to have been a blister that opened and ruptured. There is a little bit of slough on the surface. Erin [name], Erin [name] ([id number]) [id number] Page 7 of 10 Integumentary (Hair, Skin) Wound #1 status is Open.  Original cause of wound was Gradually Appeared. The date acquired was: 08/29/2022. The wound has been in treatment 4 weeks. The wound is located on the Left,Medial Lower Leg. The wound measures 3.4cm length x 2.1cm width x 0.1cm depth; 5.608cm^2 area and 0.561cm^3 volume. There is Fat Layer (Subcutaneous Tissue) exposed. There is no tunneling or undermining noted. There is a medium amount of serosanguineous drainage noted. The wound margin is distinct with the outline attached to the wound base. There is large (67-100%) red, hyper - granulation within the wound bed. There is a small (1- 33%) amount of necrotic tissue within the wound bed including Eschar and Adherent Slough. The periwound skin appearance had no abnormalities noted for texture. The periwound skin appearance had no abnormalities noted for moisture. The periwound skin appearance had no abnormalities noted for color. Periwound temperature was noted as No Abnormality. The periwound has tenderness on palpation. Wound #2 status is Open. Original cause of wound was Blister. The date acquired was: 12/23/2022. The wound has been in treatment 1 weeks. The wound is located on the Left,Proximal,Medial Lower Leg. The wound measures 0cm length x 0cm width x 0cm depth; 0cm^2 area and 0cm^3 volume. There is no tunneling or undermining noted. There is a none present amount of drainage noted. The wound margin is distinct with the outline attached to the wound base. There is no granulation within the wound bed. There is no necrotic tissue within the wound bed. The periwound skin appearance had no abnormalities noted for texture. The periwound skin appearance had no abnormalities noted for moisture. The periwound skin appearance had no abnormalities noted for color. Periwound temperature was noted as No Abnormality. Wound #3 status is Open. Original cause of wound was Blister. The date acquired was: 12/30/2022. The wound is located on the Left,Lateral Lower  Leg. The wound measures 1cm length x 1cm width x 0.1cm depth; 0.785cm^2 area and 0.079cm^3 volume. There is Fat Layer (Subcutaneous Tissue) exposed. There is no tunneling or undermining noted. There is a medium amount of serosanguineous drainage noted. The wound margin is distinct with the outline attached to the wound base. There is large (67-100%) red granulation within the wound bed. There is a small (1-33%) amount of necrotic tissue within the wound bed including Adherent Slough. The periwound skin appearance had no abnormalities noted for texture. The periwound skin appearance had no abnormalities noted for moisture. The periwound skin appearance exhibited: Hemosiderin Staining. Periwound temperature was noted as No Abnormality. 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 There was a Selective/Open Wound Non-Viable Tissue Debridement with a  total area of 5.6 sq cm performed by Duanne Guess, MD. With the following instrument(s): Curette to remove Non-Viable tissue/material. Material removed includes [facility] after achieving pain control using Lidocaine 4% Topical Solution. No specimens were taken. A time out was conducted at 11:02, prior to the start of the procedure. A Minimum amount of bleeding was controlled with Pressure. The procedure was tolerated well. Post Debridement Measurements: 3.4cm length x 2.1cm width x 0.1cm depth; 0.561cm^3 volume. Character of Wound/Ulcer Post Debridement is improved. Severity of Tissue Post Debridement is: Fat layer exposed. Post procedure Diagnosis Wound #1: Same as Pre-Procedure General  Notes: scribed for Dr. Lady Gary by Erin Bruin, RN. Wound #3 Pre-procedure diagnosis of Wound #3 is a Venous Leg Ulcer located on the Left,Lateral Lower Leg .Severity of Tissue Pre Debridement is: Fat layer exposed. There was a Selective/Open Wound Skin/Epidermis Debridement with a total area of 0.78 sq cm performed by Duanne Guess, MD. With the following instrument(s): Curette to remove Non-Viable tissue/material. Material removed includes [facility] and Skin: Epidermis and after achieving pain control using Lidocaine 4% T opical Solution. No specimens were taken. A time out was conducted at 11:02, prior to the start of the procedure. A Minimum amount of bleeding was controlled with Pressure. The procedure was tolerated well. Post Debridement Measurements: 1cm length x 1cm width x 0.1cm depth; 0.079cm^3 volume. Character of Wound/Ulcer Post Debridement is improved. Severity of Tissue Post Debridement is: Fat layer exposed. Post procedure Diagnosis Wound #3: Same as Pre-Procedure General Notes: scribed for Dr. Lady Gary by Erin Bruin, RN. Plan Follow-up Appointments: Return Appointment in 1 week. - Dr. Lady Gary - room 2 Anesthetic: (In clinic) Topical Lidocaine 4% applied to wound bed Bathing/ Shower/ Hygiene: May shower with protection but do not get wound dressing(s) wet. Protect dressing(s) with water repellant cover (for example, large plastic bag) or a cast cover and may then take shower. Edema Control - Lymphedema / SCD / Other: Elevate legs to the level of the heart or above for 30 minutes daily and/or when sitting for 3-4 times a day throughout the day. Avoid standing for long periods of time. The following medication(s) was prescribed: lidocaine topical 4 % cream cream topical was prescribed at facility WOUND #1: - Lower Leg Wound Laterality: Left, Medial Cleanser: Soap and Water 1 x Per Week/30 Days Discharge Instructions: May shower and wash wound with dial antibacterial soap  and water prior to dressing change. Cleanser: Wound Cleanser 1 x Per Week/30 Days Discharge Instructions: Cleanse the wound with wound cleanser prior to applying a clean dressing using gauze sponges, not tissue or cotton balls. Peri-Wound Care: Sween Lotion (Moisturizing lotion) 1 x Per Week/30 Days Discharge Instructions: Apply moisturizing lotion as directed Prim Dressing: Hydrofera Blue Ready Transfer Foam, 2.5x2.5 (in/in) 1 x Per Week/30 Days Erin [name], Erin [name] ([id number]) [phone].pdf Page 8 of 10 Discharge Instructions: Apply directly to wound bed as directed Secondary Dressing: ABD Pad, 5x9 1 x Per Week/30 Days Discharge Instructions: Apply over primary dressing as directed. Secondary Dressing: Woven Gauze Sponge, Non-Sterile 4x4 in 1 x Per Week/30 Days Discharge Instructions: Apply over primary dressing as directed. Com pression Wrap: Urgo K2 Lite, (equivalent to a 3 layer) two layer compression system, regular 1 x Per Week/30 Days Discharge Instructions: Apply Urgo K2 Lite as directed (alternative to 3 layer compression). WOUND #3: - Lower Leg Wound Laterality: Left, Lateral Cleanser: Soap and Water 1 x Per Week/30 Days Discharge Instructions: May shower and wash wound with dial antibacterial soap and  water prior to dressing change. Cleanser: Wound Cleanser 1 x Per Week/30 Days Discharge Instructions: Cleanse the wound with wound cleanser prior to applying a clean dressing using gauze sponges, not tissue or cotton balls. Peri-Wound Care: Sween Lotion (Moisturizing lotion) 1 x Per Week/30 Days Discharge Instructions: Apply moisturizing lotion as directed Prim Dressing: Maxorb Extra Ag+ Alginate Dressing, 2x2 (in/in) 1 x Per Week/30 Days ary Discharge Instructions: Apply to wound bed as instructed Secondary Dressing: Woven Gauze Sponge, Non-Sterile 4x4 in 1 x Per Week/30 Days Discharge Instructions: Apply over primary dressing as directed. Com pression  Wrap: Urgo K2 Lite, (equivalent to a 3 layer) two layer compression system, regular 1 x Per Week/30 Days Discharge Instructions: Apply Urgo K2 Lite as directed (alternative to 3 layer compression). 12/30/2022: The wound is about the same size, but substantially cleaner. There is some hypertrophic granulation tissue starting to emerge. The wound that was new last week has closed but she has a different new wound today on the lateral aspect of her left lower leg. It also appears to have been a blister that opened and ruptured. There is a little bit of slough on the surface. I used a curette to debride slough off of the left medial ankle wound and slough and dead skin from the new wound on her left lateral lower leg. I think perhaps we are not getting adequate compression and she is developing blisters as result. Although her ABIs were noncompressible, she has easily palpable pedal pulses. I am going to apply Hydrofera Blue to the ankle and silver alginate to the lateral leg. We will put her in Urgo lite wraps to see if this makes a difference. Follow-up in 1 week. Electronic Signature(s) Signed: 12/30/2022 11:22:16 AM By: Duanne Guess MD FACS Entered By: Duanne Guess on 12/30/2022 11:22:16 -------------------------------------------------------------------------------- HxROS Details Patient Name: Date of Service: [name], [name] 12/30/2022 10:45 A M Medical Record Number: [id number] Patient Account Number: [id number] Date of Birth/Sex: Treating RN: January 19, 1941 (82 y.o. F) Primary Care Provider: Hoyle [name] Other Clinician: Referring Provider: Treating Provider/Extender: [name], [name] in Treatment: 4 Eyes Medical History: Positive for: Cataracts Hematologic/Lymphatic Medical History: Past Medical History Notes: thrombocytopenia Cardiovascular Medical History: Positive for: Arrhythmia - bundle branch block; Hypertension; Peripheral Venous  Disease Gastrointestinal Medical History: Past Medical History Notes: diverticulosis, GERD Endocrine Medical History: Past Medical History NotesMarland Kitchen Erin [name], Erin [name] ([id number]) [id number] Page 9 of 10 hypothyroidism Genitourinary Medical History: Past Medical History Notes: CKD stage III Musculoskeletal Medical History: Positive for: Gout; Osteoarthritis Past Medical History Notes: DJD, osteoporosis Oncologic Medical History: Past Medical History Notes: breast cancer HBO Extended History Items Eyes: Cataracts Immunizations Pneumococcal Vaccine: Received Pneumococcal Vaccination: Yes Received Pneumococcal Vaccination On or After 60th Birthday: No Implantable Devices No devices added Hospitalization / Surgery History Type of Hospitalization/Surgery Salpingoophorectomy Colon resection Appendectomy Cystoscopy w/ retrogrades Joint replacement Mastectomy right gastric ulcer repair Abdominal hysterectomy Family and Social History Cancer: Yes - Siblings; Diabetes: No; Heart Disease: No; Hereditary Spherocytosis: No; Hypertension: No; Kidney Disease: No; Lung Disease: Yes - Siblings; Seizures: No; Stroke: No; Thyroid Problems: No; Tuberculosis: No; Never smoker; Marital Status - Widowed; Alcohol Use: Never; Drug Use: No History; Caffeine Use: Moderate; Financial Concerns: No; Food, Clothing or Shelter Needs: No; Support System Lacking: No; Transportation Concerns: No Electronic Signature(s) Signed: 12/30/2022 11:23:48 AM By: Duanne Guess MD FACS Entered By: Duanne Guess on 12/30/2022 11:20:23 -------------------------------------------------------------------------------- SuperBill Details Patient Name: Date of Service: [name],  [name] 12/30/2022 Medical Record Number: [id number] Patient Account Number: [id number] Date of Birth/Sex: Treating RN: 01-Jan-1941 (82 y.o. 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 I10 Essential (primary) hypertension Facility Procedures : CPT4 Code: [id number] Description: 97597 - DEBRIDE WOUND 1ST 20 SQ CM OR < ICD-10 Diagnosis Description L97.822 Non-pressure chronic ulcer of other part of left lower leg with fat layer exposed Modifier: Quantity: 1 Physician Procedures : CPT4 Code Description Modifier [id number] - WC PHYS LEVEL 4 - EST PT 25 ICD-10 Diagnosis Description L97.822 Non-pressure chronic ulcer of other part of left lower leg with fat layer exposed I87.332 Chronic venous hypertension (idiopathic) with  ulcer and inflammation of left lower extremity I10 Essential (primary) hypertension Quantity: 1 : [id number] 97597 - WC PHYS DEBR WO ANESTH 20 SQ CM ICD-10 Diagnosis Description L97.822 Non-pressure chronic ulcer of other part of left lower leg with fat layer exposed Quantity: 1 Electronic Signature(s) Signed: 12/30/2022 11:23:01 AM By: Duanne Guess MD FACS Entered By: Duanne Guess on 12/30/2022 11:23:00

## 2022-12-30 NOTE — Progress Notes (Signed)
LENNIX, ROTUNDO ([id number]) [id number] Page 1 of 9 Visit Report for 12/30/2022 Arrival Information Details Patient Name: Date of Service: [name], [name] RBA RA N. 12/30/2022 10:45 A M Medical Record Number: [id number] Patient Account Number: [id number] Date of Birth/Sex: Treating RN: December 28, 1940 (82 y.o. [name] Primary Care Monteen Toops: [name] Other Clinician: Referring Cayde Held: Treating Leilynn Pilat/Extender: [name] Weeks in Treatment: 4 Visit Information History Since Last Visit Added or deleted any medications: No Patient Arrived: Ambulatory Any new allergies or adverse reactions: No Arrival Time: 10:35 Had a fall or experienced change in No Accompanied By: daughter activities of daily living that may affect Transfer Assistance: None risk of falls: Patient Identification Verified: Yes Signs or symptoms of abuse/neglect since last visito No Secondary Verification Process Completed: Yes Hospitalized since last visit: No Patient Requires Transmission-Based Precautions: No Implantable device outside of the clinic excluding No Patient Has Alerts: No cellular tissue based products placed in the center since last visit: Has Dressing in Place as Prescribed: Yes Has Compression in Place as Prescribed: Yes Pain Present Now: Yes Electronic Signature(s) Signed: 12/30/2022 3:31:57 PM By: Samuella Bruin Entered By: Samuella Bruin on 12/30/2022 10:41:02 -------------------------------------------------------------------------------- Encounter Discharge Information Details Patient Name: Date of Service: [name], [name] 12/30/2022 10:45 A M Medical Record Number: [id number] Patient Account Number: [id number] Date of Birth/Sex: Treating RN: 05-09-1941 (82 y.o. [name] Primary Care Zackariah Vanderpol: [name] Other Clinician: Referring Jazzmine Kleiman: Treating Thurley Francesconi/Extender: [name] Weeks in Treatment: 4 Encounter Discharge Information Items Post Procedure Vitals Discharge Condition: Stable Temperature (F): 97.6 Ambulatory Status: Ambulatory Pulse (bpm): 63 Discharge Destination: Home Respiratory Rate (breaths/min): 18 Transportation: Private Auto Blood Pressure (mmHg): 173/72 Accompanied By: daughter Schedule Follow-up Appointment: Yes Clinical Summary of Care: Patient Declined Electronic Signature(s) Signed: 12/30/2022 3:31:57 PM By: Samuella Bruin Entered By: Samuella Bruin on 12/30/2022 11:23:18 [name] ([id number]) [id number] Page 2 of 9 -------------------------------------------------------------------------------- Lower Extremity Assessment Details Patient Name: Date of Service: [name], [name] 12/30/2022 10:45 A M Medical Record Number: [id number] Patient Account Number: [id number] Date of Birth/Sex: Treating RN: 1940/11/05 (82 y.o. [name] Primary Care Naim Murtha: [name] Other Clinician: Referring Zymiere Trostle: Treating Carely Nappier/Extender: [name], [name] in Treatment: 4 Edema Assessment Assessed: [Left: No] [Right: No] [Left: Edema] [Right: :] Calf Left: Right: Point of Measurement: From Medial Instep 30.5 cm Ankle Left: Right: Point of Measurement: From Medial Instep 19.8 cm Vascular Assessment Pulses: Dorsalis Pedis Palpable: [Left:Yes] Electronic Signature(s) Signed: 12/30/2022 3:31:57 PM By: Samuella Bruin Entered By: Samuella Bruin on 12/30/2022 10:47:14 -------------------------------------------------------------------------------- Multi Wound Chart Details Patient Name: Date of Service: [name], [name] 12/30/2022 10:45 A M Medical Record Number: [id number] Patient Account Number: [id number] Date of Birth/Sex: Treating RN: July 15, 1940 (82 y.o. F) Primary Care Laniah Grimm: [name] Other  Clinician: Referring Keundra Petrucelli: Treating Jere Bostrom/Extender: [name], [name] in Treatment: 4 Vital Signs Height(in): 60 Pulse(bpm): 63 Weight(lbs): 138 Blood Pressure(mmHg): 173/72 Body Mass Index(BMI): 26.9 Temperature(F): 97.6 Respiratory Rate(breaths/min): 18 [1:Photos:] [3:No Photos [id number].pdf Page 3 of 9] Left, Medial Lower Leg Left, Proximal, Medial Lower Leg Left, Lateral Lower Leg Wound Location: Gradually Appeared Blister Blister Wounding Event: Venous Leg Ulcer Venous Leg Ulcer Venous Leg Ulcer Primary Etiology: Cataracts, Arrhythmia, Hypertension, Cataracts, Arrhythmia, Hypertension, Cataracts, Arrhythmia, Hypertension, Comorbid History: Peripheral Venous Disease, Gout, Peripheral Venous Disease, Gout, Peripheral Venous Disease, Gout, Osteoarthritis  Osteoarthritis Osteoarthritis 08/29/2022 12/23/2022 12/30/2022 Date Acquired: 4 1 0 Weeks of Treatment: Open Open Open Wound Status: No No No Wound Recurrence: 3.4x2.1x0.1 0x0x0 1x1x0.1 Measurements L x W x D (cm) 5.608 0 0.785 A (cm) : rea 0.561 0 0.079 Volume (cm) : 4.50% 100.00% N/A % Reduction in A rea: 4.40% 100.00% N/A % Reduction in Volume: Full Thickness Without Exposed Full Thickness Without Exposed Full Thickness Without Exposed Classification: Support Structures Support Structures Support Structures Medium None Present Medium Exudate A mount: Serosanguineous N/A Serosanguineous Exudate Type: red, brown N/A red, brown Exudate Color: Distinct, outline attached Distinct, outline attached Distinct, outline attached Wound Margin: Large (67-100%) None Present (0%) Large (67-100%) Granulation A mount: Red, Hyper-granulation N/A Red Granulation Quality: Small (1-33%) None Present (0%) Small (1-33%) Necrotic A mount: Eschar, Adherent Slough N/A Adherent Slough Necrotic Tissue: Fat Layer (Subcutaneous Tissue): Yes Fascia: No Fat Layer (Subcutaneous  Tissue): Yes Exposed Structures: Fascia: No Fat Layer (Subcutaneous Tissue): No Fascia: No Tendon: No Tendon: No Tendon: No Muscle: No Muscle: No Muscle: No Joint: No Joint: No Joint: No Bone: No Bone: No Bone: No Small (1-33%) Large (67-100%) None Epithelialization: Debridement - Selective/Open Wound N/A Debridement - Selective/Open Wound Debridement: Pre-procedure Verification/Time Out 11:02 N/A 11:02 Taken: Lidocaine 4% Topical Solution N/A Lidocaine 4% Topical Solution Pain Control: Slough N/A Slough Tissue Debrided: Non-Viable Tissue N/A Skin/Epidermis Level: 5.6 N/A 0.78 Debridement A (sq cm): rea Curette N/A Curette Instrument: Minimum N/A Minimum Bleeding: Pressure N/A Pressure Hemostasis A chieved: Procedure was tolerated well N/A Procedure was tolerated well Debridement Treatment Response: 3.4x2.1x0.1 N/A 1x1x0.1 Post Debridement Measurements L x W x D (cm) 0.561 N/A 0.079 Post Debridement Volume: (cm) No Abnormalities Noted No Abnormalities Noted No Abnormalities Noted Periwound Skin Texture: No Abnormalities Noted No Abnormalities Noted No Abnormalities Noted Periwound Skin Moisture: No Abnormalities Noted No Abnormalities Noted Hemosiderin Staining: Yes Periwound Skin Color: No Abnormality No Abnormality No Abnormality Temperature: Yes N/A N/A Tenderness on Palpation: Debridement N/A Debridement Procedures Performed: Treatment Notes Electronic Signature(s) Signed: 12/30/2022 11:19:17 AM By: Duanne Guess MD FACS Entered By: Duanne Guess on 12/30/2022 11:19:17 -------------------------------------------------------------------------------- Multi-Disciplinary Care Plan Details Patient Name: Date of Service: [name], [name] 12/30/2022 10:45 A M Medical Record Number: [id number] Patient Account Number: [id number] Date of Birth/Sex: Treating RN: [birth year] (82 y.o. [name] Primary Care Nefertari Rebman: [name]  Other Clinician: Referring Arah Aro: Treating Authur Cubit/Extender: [name], [name] in Treatment: [address] JAMERICA, SNAVELY ([id number]) [id number] Page 4 of 9 Necrotic Tissue Nursing Diagnoses: Impaired tissue integrity related to necrotic/devitalized tissue Knowledge deficit related to management of necrotic/devitalized tissue Goals: Necrotic/devitalized tissue will be minimized in the wound bed Date Initiated: 11/26/2022 Target Resolution Date: 01/09/2023 Goal Status: Active Patient/caregiver will verbalize understanding of reason and process for debridement of necrotic tissue Date Initiated: 11/26/2022 Target Resolution Date: 01/09/2023 Goal Status: Active Interventions: Assess patient pain level pre-, during and post procedure and prior to discharge Provide education on necrotic tissue and debridement process Treatment Activities: Apply topical anesthetic as ordered : 11/26/2022 Notes: Wound/Skin Impairment Nursing Diagnoses: Impaired tissue integrity Knowledge deficit related to ulceration/compromised skin integrity Goals: Patient/caregiver will verbalize understanding of skin care regimen Date Initiated: 11/26/2022 Target Resolution Date: 01/09/2023 Goal Status: Active Interventions: Assess ulceration(s) every visit Treatment Activities: Skin care regimen initiated : 11/26/2022 Topical wound management initiated : 11/26/2022 Notes: Electronic Signature(s) Signed: 12/30/2022 3:31:57 PM By: Samuella Bruin Entered By: Samuella Bruin on 12/30/2022 10:54:18 --------------------------------------------------------------------------------  Pain Assessment Details Patient Name: Date of Service: [name], [name] RBA RA N. 12/30/2022 10:45 A M Medical Record Number: [id number] Patient Account Number: [id number] Date of Birth/Sex: Treating RN: 12/10/1940 (82 y.o. [name] Primary Care Marielle Mantione: [name] Other Clinician: Referring Makalya Nave: Treating Shae Hinnenkamp/Extender: [name] Weeks in Treatment: 4 Active Problems Location of Pain Severity and Description of Pain Patient Has Paino Yes Site Locations Pain Location: ARIELLAH, FAUST ([id number]) [id number] Page 5 of 9 Pain Location: Pain in Ulcers Duration of the Pain. Constant / Intermittento Intermittent Rate the pain. Current Pain Level: 10 Character of Pain Describe the Pain: Throbbing Pain Management and Medication Current Pain Management: Medication: Yes Electronic Signature(s) Signed: 12/30/2022 3:31:57 PM By: Samuella Bruin Entered By: Samuella Bruin on 12/30/2022 10:41:24 -------------------------------------------------------------------------------- Patient/Caregiver Education Details Patient Name: Date of Service: [name], [name] 6/25/2024andnbsp10:45 A M Medical Record Number: [id number] Patient Account Number: [id number] Date of Birth/Gender: Treating RN: [birth year] (82 y.o. [name] Primary Care Physician: [name] Other Clinician: Referring Physician: Treating Physician/Extender: [name] in Treatment: 4 Education Assessment Education Provided To: Patient Education Topics Provided Wound/Skin Impairment: Methods: Explain/Verbal Responses: Reinforcements needed, State content correctly Electronic Signature(s) Signed: 12/30/2022 3:31:57 PM By: Samuella Bruin Entered By: Samuella Bruin on 12/30/2022 10:54:44 -------------------------------------------------------------------------------- Wound Assessment Details Patient Name: Date of Service: [name], [name] 12/30/2022 10:45 A NICKIA, BOESEN [phone] [id number]) [id number] Page 6 of 9 Medical Record Number: [id number] Patient Account Number: [id number] Date of Birth/Sex: Treating RN: 09-09-1940 (82  y.o. [name] Primary Care Salimah Martinovich: [name] Other Clinician: Referring Angelene Rome: Treating Terri Malerba/Extender: [name], [name] in Treatment: 4 Wound Status Wound Number: 1 Primary Venous Leg Ulcer Etiology: Wound Location: Left, Medial Lower Leg Wound Open Wounding Event: Gradually Appeared Status: Date Acquired: 08/29/2022 Comorbid Cataracts, Arrhythmia, Hypertension, Peripheral Venous Weeks Of Treatment: 4 History: Disease, Gout, Osteoarthritis Clustered Wound: No Photos Wound Measurements Length: (cm) 3.4 Width: (cm) 2.1 Depth: (cm) 0.1 Area: (cm) 5.608 Volume: (cm) 0.561 % Reduction in Area: 4.5% % Reduction in Volume: 4.4% Epithelialization: Small (1-33%) Tunneling: No Undermining: No Wound Description Classification: Full Thickness Without Exposed Support Structures Wound Margin: Distinct, outline attached Exudate Amount: Medium Exudate Type: Serosanguineous Exudate Color: red, brown Foul Odor After Cleansing: No Slough/Fibrino Yes Wound Bed Granulation Amount: Large (67-100%) Exposed Structure Granulation Quality: Red, Hyper-granulation Fascia Exposed: No Necrotic Amount: Small (1-33%) Fat Layer (Subcutaneous Tissue) Exposed: Yes Necrotic Quality: Eschar, Adherent Slough Tendon Exposed: No Muscle Exposed: No Joint Exposed: No Bone Exposed: No Periwound Skin Texture Texture Color No Abnormalities Noted: Yes No Abnormalities Noted: Yes Moisture Temperature / Pain No Abnormalities Noted: Yes Temperature: No Abnormality Tenderness on Palpation: Yes Treatment Notes Wound #1 (Lower Leg) Wound Laterality: Left, Medial Cleanser Soap and Water Discharge Instruction: May shower and wash wound with dial antibacterial soap and water prior to dressing change. Wound Cleanser Discharge Instruction: Cleanse the wound with wound cleanser prior to applying a clean dressing using gauze sponges, not tissue or cotton  balls. Peri-Wound Care Sween Lotion (Moisturizing lotion) Discharge Instruction: Apply moisturizing lotion as directed SHIVONNE, SCHWARTZMAN ([id number]) [id number] Page 7 of 9 Topical Primary Dressing Hydrofera Blue Ready Transfer Foam, 2.5x2.5 (in/in) Discharge Instruction: Apply directly to wound bed as directed Secondary Dressing ABD Pad, 5x9 Discharge Instruction: Apply over primary dressing as directed. Woven Gauze Sponge, Non-Sterile 4x4 in Discharge Instruction:  Apply over primary dressing as directed. Secured With Compression Wrap Urgo K2 Lite, (equivalent to a 3 layer) two layer compression system, regular Discharge Instruction: Apply Urgo K2 Lite as directed (alternative to 3 layer compression). Compression Stockings Add-Ons Electronic Signature(s) Signed: 12/30/2022 3:31:57 PM By: Samuella Bruin Entered By: Samuella Bruin on 12/30/2022 10:49:58 -------------------------------------------------------------------------------- Wound Assessment Details Patient Name: Date of Service: [name], [name] 12/30/2022 10:45 A M Medical Record Number: [id number] Patient Account Number: [id number] Date of Birth/Sex: Treating RN: 1940-12-21 (82 y.o. [name] Primary Care Angenette Daily: [name] Other Clinician: Referring Demarie Hyneman: Treating Zannah Melucci/Extender: [name], [name] in Treatment: 4 Wound Status Wound Number: 2 Primary Venous Leg Ulcer Etiology: Wound Location: Left, Proximal, Medial Lower Leg Wound Open Wounding Event: Blister Status: Date Acquired: 12/23/2022 Comorbid Cataracts, Arrhythmia, Hypertension, Peripheral Venous Weeks Of Treatment: 1 History: Disease, Gout, Osteoarthritis Clustered Wound: No Photos Wound Measurements Length: (cm) Width: (cm) Depth: (cm) Area: (cm) Volume: (cm) 0 % Reduction in Area: 100% 0 % Reduction in Volume: 100% 0 Epithelialization: Large  (67-100%) 0 Tunneling: No 0 Undermining: No Wound Description Classification: Full Thickness Without Exposed Support Structures MYLEIGH, AMARA ([id number]) Wound Margin: Distinct, outline attached Exudate Amount: None Present Foul Odor After Cleansing: No [phone].pdf Page 8 of 9 Slough/Fibrino No Wound Bed Granulation Amount: None Present (0%) Exposed Structure Necrotic Amount: None Present (0%) Fascia Exposed: No Fat Layer (Subcutaneous Tissue) Exposed: No Tendon Exposed: No Muscle Exposed: No Joint Exposed: No Bone Exposed: No Periwound Skin Texture Texture Color No Abnormalities Noted: Yes No Abnormalities Noted: Yes Moisture Temperature / Pain No Abnormalities Noted: Yes Temperature: No Abnormality Electronic Signature(s) Signed: 12/30/2022 3:31:57 PM By: Samuella Bruin Entered By: Samuella Bruin on 12/30/2022 10:51:04 -------------------------------------------------------------------------------- Wound Assessment Details Patient Name: Date of Service: [name], [name] 12/30/2022 10:45 A M Medical Record Number: [id number] Patient Account Number: [id number] Date of Birth/Sex: Treating RN: 08-20-1940 (82 y.o. [name] Primary Care Lulani Bour: [name] Other Clinician: Referring Yeraldine Forney: Treating Rangel Echeverri/Extender: [name], [name] in Treatment: 4 Wound Status Wound Number: 3 Primary Venous Leg Ulcer Etiology: Wound Location: Left, Lateral Lower Leg Wound Open Wounding Event: Blister Status: Date Acquired: 12/30/2022 Comorbid Cataracts, Arrhythmia, Hypertension, Peripheral Venous Weeks Of Treatment: 0 History: Disease, Gout, Osteoarthritis Clustered Wound: No Wound Measurements Length: (cm) 1 Width: (cm) 1 Depth: (cm) 0.1 Area: (cm) 0.785 Volume: (cm) 0.079 % Reduction in Area: % Reduction in Volume: Epithelialization: None Tunneling: No Undermining: No Wound  Description Classification: Full Thickness Without Exposed Support Structures Wound Margin: Distinct, outline attached Exudate Amount: Medium Exudate Type: Serosanguineous Exudate Color: red, brown Foul Odor After Cleansing: No Slough/Fibrino Yes Wound Bed Granulation Amount: Large (67-100%) Exposed Structure Granulation Quality: Red Fascia Exposed: No Necrotic Amount: Small (1-33%) Fat Layer (Subcutaneous Tissue) Exposed: Yes Necrotic Quality: Adherent Slough Tendon Exposed: No Muscle Exposed: No Joint Exposed: No Bone Exposed: No [address] KINLIE, JANICE ([id number]) [id number] Page 9 of 9 No Abnormalities Noted: Yes No Abnormalities Noted: No Hemosiderin Staining: Yes Moisture No Abnormalities Noted: Yes Temperature / Pain Temperature: No Abnormality Treatment Notes Wound #3 (Lower Leg) Wound Laterality: Left, Lateral Cleanser Soap and Water Discharge Instruction: May shower and wash wound with dial antibacterial soap and water prior to dressing change. Wound Cleanser Discharge Instruction: Cleanse the wound with wound cleanser prior to applying a clean dressing using gauze sponges, not tissue or cotton balls. Peri-Wound  Care Sween Lotion (Moisturizing lotion) Discharge Instruction: Apply moisturizing lotion as directed Topical Primary Dressing Maxorb Extra Ag+ Alginate Dressing, 2x2 (in/in) Discharge Instruction: Apply to wound bed as instructed Secondary Dressing Woven Gauze Sponge, Non-Sterile 4x4 in Discharge Instruction: Apply over primary dressing as directed. Secured With Compression Wrap Urgo K2 Lite, (equivalent to a 3 layer) two layer compression system, regular Discharge Instruction: Apply Urgo K2 Lite as directed (alternative to 3 layer compression). Compression Stockings Add-Ons Electronic Signature(s) Signed: 12/30/2022 3:31:57 PM By: Samuella Bruin Entered By: Samuella Bruin on  12/30/2022 11:01:44 -------------------------------------------------------------------------------- Vitals Details Patient Name: Date of Service: [name], [name] 12/30/2022 10:45 A M Medical Record Number: [id number] Patient Account Number: [id number] Date of Birth/Sex: Treating RN: 07-14-1940 (82 y.o. [name] Primary Care Chae Oommen: [name] Other Clinician: Referring Caitlyne Ingham: Treating Morey Andonian/Extender: [name], [name] in Treatment: 4 Vital Signs Time Taken: 10:41 Temperature (F): 97.6 Height (in): 60 Pulse (bpm): 63 Weight (lbs): 138 Respiratory Rate (breaths/min): 18 Body Mass Index (BMI): 26.9 Blood Pressure (mmHg): 173/72 Reference Range: 80 - 120 mg / dl Electronic Signature(s) Signed: 12/30/2022 3:31:57 PM By: Samuella Bruin Entered By: Samuella Bruin on 12/30/2022 10:42:21

## 2022-12-31 DIAGNOSIS — D696 Thrombocytopenia, unspecified: Secondary | ICD-10-CM | POA: Diagnosis not present

## 2022-12-31 DIAGNOSIS — E039 Hypothyroidism, unspecified: Secondary | ICD-10-CM | POA: Diagnosis not present

## 2022-12-31 DIAGNOSIS — I129 Hypertensive chronic kidney disease with stage 1 through stage 4 chronic kidney disease, or unspecified chronic kidney disease: Secondary | ICD-10-CM | POA: Diagnosis not present

## 2022-12-31 DIAGNOSIS — I87332 Chronic venous hypertension (idiopathic) with ulcer and inflammation of left lower extremity: Secondary | ICD-10-CM | POA: Diagnosis not present

## 2022-12-31 DIAGNOSIS — R7301 Impaired fasting glucose: Secondary | ICD-10-CM | POA: Diagnosis not present

## 2022-12-31 DIAGNOSIS — N1831 Chronic kidney disease, stage 3a: Secondary | ICD-10-CM | POA: Diagnosis not present

## 2022-12-31 DIAGNOSIS — E785 Hyperlipidemia, unspecified: Secondary | ICD-10-CM | POA: Diagnosis not present

## 2022-12-31 DIAGNOSIS — N823 Fistula of vagina to large intestine: Secondary | ICD-10-CM | POA: Diagnosis not present

## 2023-01-06 ENCOUNTER — Encounter (HOSPITAL_BASED_OUTPATIENT_CLINIC_OR_DEPARTMENT_OTHER): Payer: Medicare HMO | Attending: General Surgery | Admitting: General Surgery

## 2023-01-06 DIAGNOSIS — N183 Chronic kidney disease, stage 3 unspecified: Secondary | ICD-10-CM | POA: Insufficient documentation

## 2023-01-06 DIAGNOSIS — I129 Hypertensive chronic kidney disease with stage 1 through stage 4 chronic kidney disease, or unspecified chronic kidney disease: Secondary | ICD-10-CM | POA: Diagnosis not present

## 2023-01-06 DIAGNOSIS — Z9049 Acquired absence of other specified parts of digestive tract: Secondary | ICD-10-CM | POA: Diagnosis not present

## 2023-01-06 DIAGNOSIS — Z08 Encounter for follow-up examination after completed treatment for malignant neoplasm: Secondary | ICD-10-CM | POA: Diagnosis not present

## 2023-01-06 DIAGNOSIS — I87332 Chronic venous hypertension (idiopathic) with ulcer and inflammation of left lower extremity: Secondary | ICD-10-CM | POA: Insufficient documentation

## 2023-01-06 DIAGNOSIS — I890 Lymphedema, not elsewhere classified: Secondary | ICD-10-CM | POA: Insufficient documentation

## 2023-01-06 DIAGNOSIS — M109 Gout, unspecified: Secondary | ICD-10-CM | POA: Insufficient documentation

## 2023-01-06 DIAGNOSIS — Z853 Personal history of malignant neoplasm of breast: Secondary | ICD-10-CM | POA: Diagnosis not present

## 2023-01-06 DIAGNOSIS — M1990 Unspecified osteoarthritis, unspecified site: Secondary | ICD-10-CM | POA: Diagnosis not present

## 2023-01-06 DIAGNOSIS — Z90721 Acquired absence of ovaries, unilateral: Secondary | ICD-10-CM | POA: Diagnosis not present

## 2023-01-06 DIAGNOSIS — L97822 Non-pressure chronic ulcer of other part of left lower leg with fat layer exposed: Secondary | ICD-10-CM | POA: Insufficient documentation

## 2023-01-06 DIAGNOSIS — I872 Venous insufficiency (chronic) (peripheral): Secondary | ICD-10-CM | POA: Insufficient documentation

## 2023-01-06 DIAGNOSIS — E039 Hypothyroidism, unspecified: Secondary | ICD-10-CM | POA: Diagnosis not present

## 2023-01-06 DIAGNOSIS — L97522 Non-pressure chronic ulcer of other part of left foot with fat layer exposed: Secondary | ICD-10-CM | POA: Insufficient documentation

## 2023-01-06 NOTE — Progress Notes (Signed)
MAILI, MACCHIO ([id number]) [id number] Page 1 of 14 Visit Report for 01/06/2023 Arrival Information Details Patient Name: Date of Service: Erin [name], [name] [name]. 01/06/2023 9:15 A M Medical Record Number: [id number] Patient Account Number: [id number] Date of Birth/Sex: Treating RN: 08/28/1940 (82 y.o. Erin [name] Primary Care Erin [name]: Erin [name] Other Clinician: Referring Erin [name]: Treating Erin [name]/Extender: Erin [name] in Treatment: 5 Visit Information History Since Last Visit Added or deleted any medications: Erin Patient Arrived: Ambulatory Any new allergies or adverse reactions: Erin Arrival Time: 09:06 Had a fall or experienced change in Erin Accompanied By: family activities of daily living that may affect Transfer Assistance: None risk of falls: Patient Identification Verified: Yes Signs or symptoms of abuse/neglect since last visito Erin Patient Requires Transmission-Based Precautions: Erin Hospitalized since last visit: Erin Patient Has Alerts: Erin Implantable device outside of the clinic excluding Erin cellular tissue based products placed in the center since last visit: Has Dressing in Place as Prescribed: Yes Has Compression in Place as Prescribed: Yes Pain Present Now: Erin Electronic Signature(s) Signed: 01/06/2023 3:06:27 PM By: Samuella Bruin Entered By: Samuella Bruin on 01/06/2023 09:13:11 -------------------------------------------------------------------------------- Encounter Discharge Information Details Patient Name: Date of Service: Erin [name], Erin [name]. 01/06/2023 9:15 A M Medical Record Number: [id number] Patient Account Number: [id number] Date of Birth/Sex: Treating RN: 01/16/1941 (82 y.o. Erin [name] Primary Care Lisia Westbay: Erin [name] Other Clinician: Referring Erin [name]: Treating Riot Waterworth/Extender: Erin [name] in Treatment: 5 Encounter  Discharge Information Items Post Procedure Vitals Discharge Condition: Stable Temperature (F): 97.9 Ambulatory Status: Ambulatory Pulse (bpm): 77 Discharge Destination: Home Respiratory Rate (breaths/min): 18 Transportation: Private Auto Blood Pressure (mmHg): 228/85 Accompanied By: self Schedule Follow-up Appointment: Yes Clinical Summary of Care: Patient Declined Notes MD aware of BP, pt stated she took her BP med around 7 am but she has white coat syndrome, is nervous about the debridement, and has had increased pain although she is not any pain now Electronic Signature(s) Signed: 01/06/2023 3:06:27 PM By: Samuella Bruin Entered By: Samuella Bruin on 01/06/2023 10:05:31 Erin [name] ([id number]) [id number] Page 2 of 14 -------------------------------------------------------------------------------- Lower Extremity Assessment Details Patient Name: Date of Service: Erin [name], Erin [name]. 01/06/2023 9:15 A M Medical Record Number: [id number] Patient Account Number: [id number] Date of Birth/Sex: Treating RN: 1941/01/12 (82 y.o. Erin [name] Primary Care Jaylin Benzel: Erin [name] Other Clinician: Referring Sonam Huelsmann: Treating Jillaine Waren/Extender: [name], Ravisankar [name] [name] in Treatment: 5 Edema Assessment Assessed: [Left: Erin] [Right: Erin] [Left: Edema] [Right: :] Calf Left: Right: Point of Measurement: From Medial Instep 29.1 cm Ankle Left: Right: Point of Measurement: From Medial Instep 19 cm Vascular Assessment Pulses: Dorsalis Pedis Palpable: [Left:Yes] Electronic Signature(s) Signed: 01/06/2023 3:06:27 PM By: Samuella Bruin Entered By: Samuella Bruin on 01/06/2023 09:17:22 -------------------------------------------------------------------------------- Multi Wound Chart Details Patient Name: Date of Service: Erin [name], Erin [name]. 01/06/2023 9:15 A M Medical Record Number: [id number] Patient Account  Number: [id number] Date of Birth/Sex: Treating RN: 1941-03-09 (82 y.o. F) Primary Care Mayla Biddy: Erin [name] Other Clinician: Referring Rupert Azzara: Treating Minta Fair/Extender: [name], Ravisankar [name] [name] in Treatment: 5 Vital Signs Height(in): 60 Pulse(bpm): 77 Weight(lbs): 138 Blood Pressure(mmHg): 228/85 Body Mass Index(BMI): 26.9 Temperature(F): 97.9 Respiratory Rate(breaths/min): 18 [1:Photos:] [[id number] Page 3 of 14] Left, Medial Lower Leg Left, Lateral Lower Leg Left, Anterior Lower Leg Wound Location: Gradually Appeared Blister Gradually Appeared Wounding Event: Venous Leg Ulcer Venous  Leg Ulcer Venous Leg Ulcer Primary Etiology: Cataracts, Arrhythmia, Hypertension, Cataracts, Arrhythmia, Hypertension, Cataracts, Arrhythmia, Hypertension, Comorbid History: Peripheral Venous Disease, Gout, Peripheral Venous Disease, Gout, Peripheral Venous Disease, Gout, Osteoarthritis Osteoarthritis Osteoarthritis 08/29/2022 12/30/2022 01/06/2023 Date Acquired: 5 1 0 [name] of Treatment: Open Open Open Wound Status: Erin Erin Erin Wound Recurrence: 3x1.7x0.1 0.4x0.3x0.1 1.2x0.7x0.1 Measurements L x W x D (cm) 4.006 0.094 0.66 A (cm) : rea 0.401 0.009 0.066 Volume (cm) : 31.80% 88.00% [name] % Reduction in A rea: 31.70% 88.60% [name] % Reduction in Volume: Full Thickness Without Exposed Full Thickness Without Exposed Full Thickness Without Exposed Classification: Support Structures Support Structures Support Structures Medium Small Medium Exudate A mount: Serosanguineous Serous Purulent Exudate Type: red, brown amber yellow, brown, green Exudate Color: Distinct, outline attached Distinct, outline attached Distinct, outline attached Wound Margin: Medium (34-66%) Large (67-100%) Medium (34-66%) Granulation A mount: Red, Hyper-granulation Red Red Granulation Quality: Medium (34-66%) Small (1-33%) Medium (34-66%) Necrotic A mount: Fat  Layer (Subcutaneous Tissue): Yes Fat Layer (Subcutaneous Tissue): Yes Fat Layer (Subcutaneous Tissue): Yes Exposed Structures: Fascia: Erin Fascia: Erin Fascia: Erin Tendon: Erin Tendon: Erin Tendon: Erin Muscle: Erin Muscle: Erin Muscle: Erin Joint: Erin Joint: Erin Joint: Erin Bone: Erin Bone: Erin Bone: Erin Small (1-33%) Medium (34-66%) None Epithelialization: Debridement - Selective/Open Wound Debridement - Selective/Open Wound Debridement - Selective/Open Wound Debridement: Pre-procedure Verification/Time Out 09:39 09:39 09:39 Taken: Lidocaine 4% Topical Solution Lidocaine 4% Topical Solution Lidocaine 4% Topical Solution Pain Control: Therapist, occupational, Slough Tissue Debrided: Non-Viable Tissue Non-Viable Tissue Non-Viable Tissue Level: 4 0.09 0.66 Debridement A (sq cm): rea Curette Curette Curette Instrument: Minimum Minimum Minimum Bleeding: Pressure Pressure Pressure Hemostasis A chieved: Procedure was tolerated well Procedure was tolerated well Procedure was tolerated well Debridement Treatment Response: 3x1.7x0.1 0.4x0.3x0.1 1.2x0.7x0.1 Post Debridement Measurements L x W x D (cm) 0.401 0.009 0.066 Post Debridement Volume: (cm) Erin Abnormalities Noted Erin Abnormalities Noted Erin Abnormalities Noted Periwound Skin Texture: Erin Abnormalities Noted Erin Abnormalities Noted Erin Abnormalities Noted Periwound Skin Moisture: Erin Abnormalities Noted Hemosiderin Staining: Yes Hemosiderin Staining: Yes Periwound Skin Color: Erin Abnormality Erin Abnormality Erin Abnormality Temperature: Yes [name] [name] Tenderness on Palpation: Debridement Debridement Debridement Procedures Performed: Wound Number: 5 6 [name] Photos: [name] Left T Third oe Left T Fourth oe [name] Wound Location: Gradually Appeared Gradually Appeared [name] Wounding Event: Lymphedema Lymphedema [name] Primary Etiology: Cataracts, Arrhythmia, Hypertension, Cataracts, Arrhythmia, Hypertension, [name] Comorbid  History: Peripheral Venous Disease, Gout, Peripheral Venous Disease, Gout, Osteoarthritis Osteoarthritis 01/06/2023 01/06/2023 [name] Date Acquired: 0 0 [name] [name] of Treatment: Open Open [name] Wound Status: Erin Erin [name] Wound Recurrence: 0.3x0.3x0.1 0.2x0.3x0.1 [name] Measurements L x W x D (cm) 0.071 0.047 [name] A (cm) : rea 0.007 0.005 [name] Volume (cm) : [name] [name] [name] % Reduction in Area: [name] [name] [name] % Reduction in Volume: Full Thickness Without Exposed Full Thickness Without Exposed [name] Classification: Support Structures Support Structures Medium Medium [name] Exudate Amount: Serous Serous [name] Exudate Type: Media planner [name] Exudate Color: Distinct, outline attached Distinct, outline attached [name] Wound Margin: Large (67-100%) Large (67-100%) [name] Granulation Amount: LISHIA, CLENDENNEN ([id number]) [id number] Page 4 of 14 Red Red [name] Granulation Quality: Small (1-33%) Small (1-33%) [name] Necrotic Amount: Fat Layer (Subcutaneous Tissue): Yes Fat Layer (Subcutaneous Tissue): Yes [name] Exposed Structures: Fascia: Erin Fascia: Erin Tendon: Erin Tendon: Erin Muscle: Erin Muscle: Erin Joint: Erin Joint: Erin Bone: Erin Bone: Erin None Small (1-33%) [name] Epithelialization: Debridement - Selective/Open Wound Debridement - Selective/Open Wound [name] Debridement: Pre-procedure Verification/Time  Out 09:39 09:39 [name] Taken: Lidocaine 4% Topical Solution Lidocaine 4% Topical Solution [name] Pain Control: [facility] [name] Tissue Debrided: Non-Viable Tissue Non-Viable Tissue [name] Level: 0.07 0.05 [name] Debridement A (sq cm): rea Curette Curette [name] Instrument: Minimum Minimum [name] Bleeding: Pressure Pressure [name] Hemostasis A chieved: Procedure was tolerated well Procedure was tolerated well [name] Debridement Treatment Response: 0.3x0.3x0.1 0.2x0.3x0.1 [name] Post Debridement Measurements L x W x D (cm) 0.007 0.005 [name] Post Debridement Volume: (cm) Erin Abnormalities Noted Erin Abnormalities  Noted [name] Periwound Skin Texture: Erin Abnormalities Noted Erin Abnormalities Noted [name] Periwound Skin Moisture: Rubor: Yes Rubor: Yes [name] Periwound Skin Color: Erin Abnormality Erin Abnormality [name] Temperature: Debridement Debridement [name] Procedures Performed: Treatment Notes Electronic Signature(s) Signed: 01/06/2023 9:49:37 AM By: Duanne Guess MD FACS Entered By: Duanne Guess on 01/06/2023 09:49:37 -------------------------------------------------------------------------------- Multi-Disciplinary Care Plan Details Patient Name: Date of Service: Erin [name], Erin [name]. 01/06/2023 9:15 A M Medical Record Number: [id number] Patient Account Number: [id number] Date of Birth/Sex: Treating RN: Jan 06, 1941 (82 y.o. Erin [name] Primary Care Leighanna Kirn: Erin [name] Other Clinician: Referring Jru Pense: Treating Chiquitta Matty/Extender: Erin [name] in Treatment: 5 Active Inactive Necrotic Tissue Nursing Diagnoses: Impaired tissue integrity related to necrotic/devitalized tissue Knowledge deficit related to management of necrotic/devitalized tissue Goals: Necrotic/devitalized tissue will be minimized in the wound bed Date Initiated: 11/26/2022 Target Resolution Date: 03/06/2023 Goal Status: Active Patient/caregiver will verbalize understanding of reason and process for debridement of necrotic tissue Date Initiated: 11/26/2022 Target Resolution Date: 03/06/2023 Goal Status: Active Interventions: Assess patient pain level pre-, during and post procedure and prior to discharge Provide education on necrotic tissue and debridement process Treatment Activities: Apply topical anesthetic as ordered : 11/26/2022 Notes: Erin [name], Erin [name] ([id number]) [phone].pdf Page 5 of 14 Wound/Skin Impairment Nursing Diagnoses: Impaired tissue integrity Knowledge deficit related to ulceration/compromised skin integrity Goals: Patient/caregiver  will verbalize understanding of skin care regimen Date Initiated: 11/26/2022 Target Resolution Date: 03/06/2023 Goal Status: Active Interventions: Assess ulceration(s) every visit Treatment Activities: Skin care regimen initiated : 11/26/2022 Topical wound management initiated : 11/26/2022 Notes: Electronic Signature(s) Signed: 01/06/2023 3:06:27 PM By: Samuella Bruin Entered By: Samuella Bruin on 01/06/2023 09:40:27 -------------------------------------------------------------------------------- Pain Assessment Details Patient Name: Date of Service: Erin [name], Erin [name]. 01/06/2023 9:15 A M Medical Record Number: [id number] Patient Account Number: [id number] Date of Birth/Sex: Treating RN: [birth year] (82 y.o. Erin [name] Primary Care Chaske Paskett: Erin [name] Other Clinician: Referring Alecxander Mainwaring: Treating Kalila Adkison/Extender: Erin [name] in Treatment: 5 Active Problems Location of Pain Severity and Description of Pain Patient Has Paino Erin Site Locations Rate the pain. Current Pain Level: 0 Pain Management and Medication Current Pain Management: Electronic Signature(s) Signed: 01/06/2023 3:06:27 PM By: Samuella Bruin Entered By: Samuella Bruin on 01/06/2023 09:13:31 Erin [name] ([id number]) [id number] Page 6 of 14 -------------------------------------------------------------------------------- Patient/Caregiver Education Details Patient Name: Date of Service: Erin [name], Erin [name]. 7/2/2024andnbsp9:15 A M Medical Record Number: [id number] Patient Account Number: [id number] Date of Birth/Gender: Treating RN: Nov 23, 1940 (82 y.o. Erin [name] Primary Care Physician: Erin [name] Other Clinician: Referring Physician: Treating Physician/Extender: [name] in Treatment: 5 Education Assessment Education Provided To: Patient Education Topics  Provided Wound/Skin Impairment: Methods: Explain/Verbal Responses: Reinforcements needed, State content correctly Electronic Signature(s) Signed: 01/06/2023 3:06:27 PM By: Samuella Bruin Entered By: Samuella Bruin on 01/06/2023 09:40:38 -------------------------------------------------------------------------------- Wound Assessment Details Patient Name: Date of Service: Erin [name], Erin RBA  RA [name]. 01/06/2023 9:15 A M Medical Record Number: [id number] Patient Account Number: [id number] Date of Birth/Sex: Treating RN: 16-Jul-1940 (82 y.o. 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, Gout, Osteoarthritis Clustered Wound: Erin Photos Wound Measurements Length: (cm) 3 Width: (cm) 1.7 Erin [name], Erin [name] ([id number]) Depth: (cm) 0.1 Area: (cm) 4.006 Volume: (cm) 0.401 % Reduction in Area: 31.8% % Reduction in Volume: 31.7% [id number] Page 7 of 14 Epithelialization: Small (1-33%) Tunneling: Erin Undermining: Erin Wound Description Classification: Full Thickness Without Exposed Support Structures Wound Margin: Distinct, outline attached Exudate Amount: Medium Exudate Type: Serosanguineous Exudate Color: red, brown Foul Odor After Cleansing: Erin Slough/Fibrino Yes Wound Bed Granulation Amount: Medium (34-66%) Exposed Structure Granulation Quality: Red, Hyper-granulation Fascia Exposed: Erin Necrotic Amount: Medium (34-66%) Fat Layer (Subcutaneous Tissue) Exposed: Yes Necrotic Quality: Adherent Slough Tendon Exposed: Erin Muscle  Exposed: Erin Joint Exposed: Erin Bone Exposed: Erin Periwound Skin Texture Texture Color Erin Abnormalities Noted: Yes Erin Abnormalities Noted: Yes Moisture Temperature / Pain Erin Abnormalities Noted: Yes Temperature: Erin Abnormality Tenderness on Palpation: Yes Treatment Notes Wound #1 (Lower Leg) Wound Laterality: Left, Medial Cleanser Soap and Water Discharge Instruction: May shower and wash wound with dial antibacterial soap and water prior to dressing change. Wound Cleanser Discharge Instruction: Cleanse the wound with wound cleanser prior to applying a clean dressing using gauze sponges, not tissue or cotton balls. Peri-Wound Care Sween Lotion (Moisturizing lotion) Discharge Instruction: Apply moisturizing lotion as directed Topical Primary Dressing Maxorb Extra Ag+ Alginate Dressing, 2x2 (in/in) Discharge Instruction: Apply to wound bed as instructed Secondary Dressing ABD Pad, 5x9 Discharge Instruction: Apply over primary dressing as directed. Woven Gauze Sponge, Non-Sterile 4x4 in Discharge Instruction: Apply over primary dressing as directed. Secured With Elastic Bandage 4 inch (ACE bandage) Discharge Instruction: Secure with ACE bandage as directed. Kerlix Roll Sterile, 4.5x3.1 (in/yd) Discharge Instruction: Secure with Kerlix as directed. Compression Wrap Compression Stockings Add-Ons Electronic Signature(s) Signed: 01/06/2023 3:06:27 PM By: Samuella Bruin Entered By: Samuella Bruin on 01/06/2023 09:27:48 Erin [name] ([id number]) [id number] Page 8 of 14 -------------------------------------------------------------------------------- Wound Assessment Details Patient Name: Date of Service: Erin [name], Erin [name]. 01/06/2023 9:15 A M Medical Record Number: [id number] Patient Account Number: [id number] Date of Birth/Sex: Treating RN: 10-Dec-1940 (82 y.o. 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, Gout, Osteoarthritis Clustered Wound: Erin Photos Wound Measurements Length: (cm) 0.4 Width: (cm) 0.3 Depth: (cm) 0.1 Area: (cm) 0.094 Volume: (cm) 0.009 % Reduction in Area: 88% % Reduction in Volume: 88.6% Epithelialization: Medium (34-66%) Tunneling: Erin Undermining: Erin Wound Description Classification: Full Thickness Without Exposed Support Structures Wound Margin: Distinct, outline attached Exudate Amount: Small Exudate Type: Serous Exudate Color: amber Foul Odor After Cleansing: Erin Slough/Fibrino Yes Wound Bed Granulation Amount: Large (67-100%) Exposed Structure Granulation Quality: Red Fascia Exposed: Erin Necrotic Amount: Small (1-33%) Fat Layer (Subcutaneous Tissue) Exposed: Yes Necrotic Quality: Adherent  Slough Tendon Exposed: Erin Muscle Exposed: Erin Joint Exposed: Erin Bone Exposed: Erin Periwound Skin Texture Texture Color Erin Abnormalities Noted: Yes Erin Abnormalities Noted: Erin Hemosiderin Staining: Yes Moisture Erin Abnormalities Noted: Yes Temperature / Pain Temperature: Erin Abnormality Treatment Notes Wound #3 (Lower Leg) Wound Laterality: Left, Lateral Cleanser Soap and Water Discharge Instruction: May shower and wash wound with dial antibacterial soap and water prior to dressing change. Wound Cleanser PANHIA, KREWSON ([id number]) [id number] Page 9 of 14 Discharge Instruction: Cleanse the wound with wound cleanser prior to applying a clean dressing using gauze sponges, not tissue or cotton balls. Peri-Wound Care Sween Lotion  (Moisturizing lotion) Discharge Instruction: Apply moisturizing lotion as directed Topical Primary Dressing Maxorb Extra Ag+ Alginate Dressing, 2x2 (in/in) Discharge Instruction: Apply to wound bed as instructed Secondary Dressing Woven Gauze Sponge, Non-Sterile 4x4 in Discharge Instruction: Apply over primary dressing as directed. Secured With Elastic Bandage 4 inch (ACE bandage) Discharge Instruction: Secure with ACE bandage as directed. Kerlix Roll Sterile, 4.5x3.1 (in/yd) Discharge Instruction: Secure with Kerlix as directed. Compression Wrap Compression Stockings Add-Ons Electronic Signature(s) Signed: 01/06/2023 3:06:27 PM By: Samuella Bruin Entered By: Samuella Bruin on 01/06/2023 09:28:18 -------------------------------------------------------------------------------- Wound Assessment Details Patient Name: Date of Service: Erin [name], Erin [name]. 01/06/2023 9:15 A M Medical Record Number: [id number] Patient Account Number: [id number] Date of Birth/Sex: Treating RN: 01/19/1941 (82 y.o. 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, Gout, Osteoarthritis Clustered Wound: Erin Photos Wound Measurements Length: (cm) 1.2 Width: (cm) 0.7 Depth: (cm) 0.1 Erin [name], Erin [name] ([id number]) Area: (cm) 0.66 Volume: (cm) 0.066 % Reduction in Area: % Reduction in Volume: Epithelialization: None [id number] Page 10 of 14 Tunneling: Erin Undermining: Erin Wound Description Classification: Full Thickness Without Exposed Support  Structures Wound Margin: Distinct, outline attached Exudate Amount: Medium Exudate Type: Purulent Exudate Color: yellow, brown, green Foul Odor After Cleansing: Erin Slough/Fibrino Yes Wound Bed Granulation Amount: Medium (34-66%) Exposed Structure Granulation Quality: Red Fascia Exposed: Erin Necrotic Amount: Medium (34-66%) Fat Layer (Subcutaneous Tissue) Exposed: Yes Necrotic Quality: Adherent Slough Tendon Exposed: Erin Muscle Exposed: Erin Joint Exposed: Erin Bone Exposed: Erin Periwound Skin Texture Texture Color Erin Abnormalities Noted: Yes Erin Abnormalities Noted: Erin Hemosiderin Staining: Yes Moisture Erin Abnormalities Noted: Yes Temperature / Pain Temperature: Erin Abnormality Treatment Notes Wound #4 (Lower Leg) Wound Laterality: Left, Anterior Cleanser Soap and Water Discharge Instruction: May shower and wash wound with dial antibacterial soap and water prior to dressing change. Wound Cleanser Discharge Instruction: Cleanse the wound with wound cleanser prior to applying a clean dressing using gauze sponges, not tissue or cotton balls. Peri-Wound Care Sween Lotion (Moisturizing lotion) Discharge Instruction: Apply moisturizing lotion as directed Topical Primary Dressing Maxorb Extra Ag+ Alginate Dressing, 2x2 (in/in) Discharge Instruction: Apply to wound bed as instructed Secondary Dressing ABD Pad, 5x9 Discharge Instruction: Apply over primary dressing as directed. Woven Gauze Sponge, Non-Sterile 4x4 in Discharge Instruction: Apply over primary dressing as directed. Secured With Elastic Bandage 4 inch (ACE bandage) Discharge Instruction: Secure with ACE bandage as directed. Kerlix Roll Sterile, 4.5x3.1 (in/yd) Discharge Instruction: Secure with Kerlix as directed. Compression Wrap Compression Stockings Add-Ons Electronic Signature(s) Signed: 01/06/2023 3:06:27 PM By: Samuella Bruin  Entered By: Samuella Bruin on 01/06/2023 09:28:43 Erin [name] ([id number])  [id number] Page 11 of 14 -------------------------------------------------------------------------------- Wound Assessment Details Patient Name: Date of Service: Erin [name], Erin [name]. 01/06/2023 9:15 A M Medical Record Number: [id number] Patient Account Number: [id number] Date of Birth/Sex: Treating RN: 09/15/1940 (82 y.o. Erin [name] Primary Care Momodou Consiglio: Erin [name] Other Clinician: Referring Gautham Hewins: Treating Romero Letizia/Extender: [name], Ravisankar [name] [name] in Treatment: 5 Wound Status Wound Number: 5 Primary Lymphedema Etiology: Wound Location: Left T Third oe Wound Open Wounding Event: Gradually Appeared Status: Date Acquired: 01/06/2023 Comorbid Cataracts, Arrhythmia, Hypertension, Peripheral Venous [name] Of Treatment: 0 History: Disease, Gout, Osteoarthritis Clustered Wound: Erin Photos Wound Measurements Length: (cm) 0.3 Width: (cm) 0.3 Depth: (cm) 0.1 Area: (cm) 0.071 Volume: (cm) 0.007 % Reduction in Area: % Reduction in Volume: Epithelialization: None Tunneling: Erin Undermining: Erin Wound Description Classification: Full Thickness Without Exposed Support Wound Margin: Distinct, outline attached Exudate Amount: Medium Exudate Type: Serous Exudate Color: amber Structures Foul Odor After Cleansing: Erin Slough/Fibrino Yes Wound Bed Granulation Amount: Large (67-100%) Exposed Structure Granulation Quality: Red Fascia Exposed: Erin Necrotic Amount: Small (1-33%) Fat Layer (Subcutaneous Tissue) Exposed: Yes Tendon Exposed: Erin Muscle Exposed: Erin Joint Exposed: Erin Bone Exposed: Erin Periwound Skin Texture Texture Color Erin Abnormalities Noted: Yes Erin Abnormalities Noted: Erin Rubor: Yes Moisture Erin Abnormalities Noted: Yes Temperature / Pain Temperature: Erin Abnormality Treatment Notes Wound #5 (Toe Third) Wound Laterality: Left [address] and [address] DIAMANTE, Erin [name] ([id number])  [id number].pdf Page 12 of 14 Discharge Instruction: May shower and wash wound with dial antibacterial soap and water prior to dressing change. Wound Cleanser Discharge Instruction: Cleanse the wound with wound cleanser prior to applying a clean dressing using gauze sponges, not tissue or cotton balls. Peri-Wound Care Sween Lotion (Moisturizing lotion) Discharge Instruction: Apply moisturizing lotion as directed Topical Primary Dressing Maxorb Extra Ag+ Alginate Dressing, 2x2 (in/in) Discharge Instruction: Apply to wound bed as instructed Secondary Dressing Woven Gauze Sponge, Non-Sterile 4x4 in Discharge Instruction: Apply over primary dressing as directed. Secured With 53M Medipore H Soft Cloth Surgical T ape, 4 x 10 (in/yd) Discharge Instruction: Secure with tape as directed. Compression Wrap Compression Stockings Add-Ons Electronic Signature(s) Signed: 01/06/2023 3:06:27 PM By: Samuella Bruin Entered By: Samuella Bruin on 01/06/2023 09:29:06 -------------------------------------------------------------------------------- Wound Assessment Details Patient Name: Date of Service: Erin [name], Erin [name]. 01/06/2023 9:15 A M Medical Record Number: [id number] Patient Account Number: [id number] Date of Birth/Sex: Treating RN: 1940-10-23 (82 y.o. 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, Gout, Osteoarthritis Clustered Wound: Erin Photos Wound Measurements Length: (cm) 0.2 Width: (cm) 0.3 Depth: (cm) 0.1 Erin [name], Erin [name] ([id number]) Area:  (cm) 0.047 Volume: (cm) 0.005 % Reduction in Area: % Reduction in Volume: Epithelialization: Small (1-33%) [phone].pdf Page 13 of 14 Tunneling: Erin Undermining: Erin Wound Description Classification: Full Thickness Without Exposed Support Structures Wound Margin: Distinct, outline attached Exudate Amount: Medium Exudate Type: Serous Exudate Color: amber Foul Odor After Cleansing: Erin Slough/Fibrino Yes Wound Bed Granulation Amount: Large (67-100%) Exposed Structure Granulation Quality: Red Fascia Exposed: Erin Necrotic Amount: Small (1-33%) Fat Layer (Subcutaneous Tissue) Exposed: Yes Necrotic Quality: Adherent Slough Tendon Exposed: Erin  Muscle Exposed: Erin Joint Exposed: Erin Bone Exposed: Erin Periwound Skin Texture Texture Color Erin Abnormalities Noted: Yes Erin Abnormalities Noted: Erin Rubor: Yes Moisture Erin Abnormalities Noted: Yes Temperature / Pain Temperature: Erin Abnormality Treatment Notes Wound #6 (Toe Fourth) Wound Laterality: Left Cleanser Soap and Water Discharge Instruction: May shower and wash wound with dial antibacterial soap and water prior to dressing change. Wound Cleanser Discharge Instruction: Cleanse the wound with wound cleanser prior to applying a clean dressing using gauze sponges, not tissue or cotton balls. Peri-Wound Care Sween Lotion (Moisturizing lotion) Discharge Instruction: Apply moisturizing lotion as directed Topical Primary Dressing Maxorb Extra Ag+ Alginate Dressing, 2x2 (in/in) Discharge Instruction: Apply to wound bed as instructed Secondary Dressing Woven Gauze Sponge, Non-Sterile 4x4 in Discharge Instruction: Apply over primary dressing as directed. Secured With 62M Medipore H Soft Cloth Surgical T ape, 4 x 10 (in/yd) Discharge Instruction: Secure with tape as directed. Compression Wrap Compression Stockings Add-Ons Electronic Signature(s) Signed: 01/06/2023 3:06:27 PM By: Samuella Bruin Entered By:  Samuella Bruin on 01/06/2023 09:29:27 Vitals Details -------------------------------------------------------------------------------- Erin [name] ([id number]) [id number] Page 14 of 14 Patient Name: Date of Service: Erin [name], Erin [name]. 01/06/2023 9:15 A M Medical Record Number: [id number] Patient Account Number: [id number] Date of Birth/Sex: Treating RN: 08-12-1940 (82 y.o. Erin [name] Primary Care Mekiah Wahler: Erin [name] Other Clinician: Referring Kahmari Koller: Treating Ermalinda Joubert/Extender: [name], Ravisankar [name] [name] in Treatment: 5 Vital Signs Time Taken: 09:13 Temperature (F): 97.9 Height (in): 60 Pulse (bpm): 77 Weight (lbs): 138 Respiratory Rate (breaths/min): 18 Body Mass Index (BMI): 26.9 Blood Pressure (mmHg): 228/85 Reference Range: 80 - 120 mg / dl Electronic Signature(s) Signed: 01/06/2023 3:06:27 PM By: Samuella Bruin Entered By: Samuella Bruin on 01/06/2023 09:13:26

## 2023-01-06 NOTE — Progress Notes (Signed)
Erin [name], Erin [name] ([id number]) [id number] Page 1 of 14 Visit Report for 01/06/2023 Chief Complaint Document Details Patient Name: Date of Service: [name], [name] RBA RA N. 01/06/2023 9:15 A M Medical Record Number: [id number] Patient Account Number: [id number] Date of Birth/Sex: Treating RN: Nov 18, 1940 (82 y.o. F) Primary Care Provider: [name] Other Clinician: Referring Provider: Treating Provider/Extender: [name] Weeks in Treatment: 5 Information Obtained from: Patient Chief Complaint Patient presents for treatment of an open ulcer due to venous insufficiency Electronic Signature(s) Signed: 01/06/2023 9:49:47 AM By: Duanne Guess MD FACS Entered By: Duanne Guess on 01/06/2023 09:49:47 -------------------------------------------------------------------------------- Debridement Details Patient Name: Date of Service: [name], [name] 01/06/2023 9:15 A M Medical Record Number: [id number] Patient Account Number: [id number] Date of Birth/Sex: Treating RN: [birth year] (82 y.o. Erin [name] Primary Care Provider: [name] Other Clinician: Referring Provider: Treating Provider/Extender: [name] Weeks in Treatment: 5 Debridement Performed for Assessment: Wound #3 Left,Lateral Lower Leg Performed By: Physician Duanne Guess, MD Debridement Type: Debridement Severity of Tissue Pre Debridement: Fat layer exposed Level of Consciousness (Pre-procedure): Awake and Alert Pre-procedure Verification/Time Out Yes - 09:39 Taken: Start Time: 09:39 Pain Control: Lidocaine 4% Topical Solution Percent of Wound Bed Debrided: 100% T Area Debrided (cm): otal 0.09 Tissue and other material debrided: Non-Viable, Eschar Level: Non-Viable Tissue Debridement Description: Selective/Open Wound Instrument: Curette Bleeding: Minimum Hemostasis Achieved: Pressure Response to Treatment:  Procedure was tolerated well Level of Consciousness (Post- Awake and Alert procedure): Post Debridement Measurements of Total Wound Length: (cm) 0.4 Width: (cm) 0.3 Depth: (cm) 0.1 Volume: (cm) 0.009 Character of Wound/Ulcer Post Debridement: Improved Severity of Tissue Post Debridement: Fat layer exposed Post Procedure Diagnosis Erin [name] ([id number]) [id number] Page 2 of 14 Same as Pre-procedure Notes scribed for Dr. Lady Gary by Samuella Bruin, RN Electronic Signature(s) Signed: 01/06/2023 10:50:39 AM By: Duanne Guess MD FACS Signed: 01/06/2023 3:06:27 PM By: Samuella Bruin Entered By: Samuella Bruin on 01/06/2023 09:39:48 -------------------------------------------------------------------------------- Debridement Details Patient Name: Date of Service: [name], [name] 01/06/2023 9:15 A M Medical Record Number: [id number] Patient Account Number: [id number] Date of Birth/Sex: Treating RN: 1941-01-07 (82 y.o. Erin [name] Primary Care Provider: [name] Other Clinician: Referring Provider: Treating Provider/Extender: [name] Weeks in Treatment: 5 Debridement Performed for Assessment: Wound #4 Left,Anterior Lower Leg Performed By: Physician Duanne Guess, MD Debridement Type: Debridement Severity of Tissue Pre Debridement: Fat layer exposed Level of Consciousness (Pre-procedure): Awake and Alert Pre-procedure Verification/Time Out Yes - 09:39 Taken: Start Time: 09:39 Pain Control: Lidocaine 4% Topical Solution Percent of Wound Bed Debrided: 100% T Area Debrided (cm): otal 0.66 Tissue and other material debrided: Non-Viable, Eschar, Slough, Slough Level: Non-Viable Tissue Debridement Description: Selective/Open Wound Instrument: Curette Bleeding: Minimum Hemostasis Achieved: Pressure Response to Treatment: Procedure was tolerated well Level of Consciousness (Post- Awake and  Alert procedure): Post Debridement Measurements of Total Wound Length: (cm) 1.2 Width: (cm) 0.7 Depth: (cm) 0.1 Volume: (cm) 0.066 Character of Wound/Ulcer Post Debridement: Improved Severity of Tissue Post Debridement: Fat layer exposed Post Procedure Diagnosis Same as Pre-procedure Notes scribed for Dr. Lady Gary by Samuella Bruin, RN Electronic Signature(s) Signed: 01/06/2023 10:50:39 AM By: Duanne Guess MD FACS Signed: 01/06/2023 3:06:27 PM By: Samuella Bruin Entered By: Samuella Bruin on 01/06/2023 09:40:54 [name] ([id number]) [id number] Page 3 of 14 -------------------------------------------------------------------------------- Debridement Details Patient Name: Date of Service: [name], [name]  01/06/2023 9:15 A M Medical Record Number: [id number] Patient Account Number: [id number] Date of Birth/Sex: Treating RN: [birth year] (82 y.o. Erin [name] Primary Care Provider: [name] Other Clinician: Referring Provider: Treating Provider/Extender: [name] Weeks in Treatment: 5 Debridement Performed for Assessment: Wound #6 Left T Fourth oe Performed By: Physician Duanne Guess, MD Debridement Type: Debridement Level of Consciousness (Pre-procedure): Awake and Alert Pre-procedure Verification/Time Out Yes - 09:39 Taken: Start Time: 09:39 Pain Control: Lidocaine 4% T opical Solution Percent of Wound Bed Debrided: 100% T Area Debrided (cm): otal 0.05 Tissue and other material debrided: Non-Viable, Slough, Slough Level: Non-Viable Tissue Debridement Description: Selective/Open Wound Instrument: Curette Bleeding: Minimum Hemostasis Achieved: Pressure Response to Treatment: Procedure was tolerated well Level of Consciousness (Post- Awake and Alert procedure): Post Debridement Measurements of Total Wound Length: (cm) 0.2 Width: (cm) 0.3 Depth: (cm) 0.1 Volume: (cm)  0.005 Character of Wound/Ulcer Post Debridement: Improved Post Procedure Diagnosis Same as Pre-procedure Notes scribed for Dr. Lady Gary by Samuella Bruin, RN Electronic Signature(s) Signed: 01/06/2023 10:50:39 AM By: Duanne Guess MD FACS Signed: 01/06/2023 3:06:27 PM By: Samuella Bruin Entered By: Samuella Bruin on 01/06/2023 09:41:26 -------------------------------------------------------------------------------- Debridement Details Patient Name: Date of Service: [name], [name] 01/06/2023 9:15 A M Medical Record Number: [id number] Patient Account Number: [id number] Date of Birth/Sex: Treating RN: 08-02-1940 (82 y.o. Erin [name] Primary Care Provider: [name] Other Clinician: Referring Provider: Treating Provider/Extender: [name] Weeks in Treatment: 5 Debridement Performed for Assessment: Wound #5 Left T Third oe Performed By: Physician Duanne Guess, MD Debridement Type: Debridement Level of Consciousness (Pre-procedure): Awake and Alert Pre-procedure Verification/Time Out Yes - 09:39 Taken: Start Time: 09:39 Pain Control: Lidocaine 4% T opical Solution Percent of Wound Bed Debrided: 100% T Area Debrided (cm): otal 0.07 Tissue and other material debrided: Non-Viable, Slough, Slough Level: Non-Viable Tissue Debridement Description: Selective/Open Wound HAEUN, BOLL ([id number]) [id number] Page 4 of 14 Instrument: Curette Bleeding: Minimum Hemostasis Achieved: Pressure Response to Treatment: Procedure was tolerated well Level of Consciousness (Post- Awake and Alert procedure): Post Debridement Measurements of Total Wound Length: (cm) 0.3 Width: (cm) 0.3 Depth: (cm) 0.1 Volume: (cm) 0.007 Character of Wound/Ulcer Post Debridement: Improved Post Procedure Diagnosis Same as Pre-procedure Notes scribed for Dr. Lady Gary by Samuella Bruin, RN Electronic  Signature(s) Signed: 01/06/2023 10:50:39 AM By: Duanne Guess MD FACS Signed: 01/06/2023 3:06:27 PM By: Samuella Bruin Entered By: Samuella Bruin on 01/06/2023 09:41:44 -------------------------------------------------------------------------------- Debridement Details Patient Name: Date of Service: [name], [name] 01/06/2023 9:15 A M Medical Record Number: [id number] Patient Account Number: [id number] Date of Birth/Sex: Treating RN: [birth year] (82 y.o. Erin [name] Primary Care Provider: [name] Other Clinician: Referring Provider: Treating Provider/Extender: [name] Weeks in Treatment: 5 Debridement Performed for Assessment: Wound #1 Left,Medial Lower Leg Performed By: Physician Duanne Guess, MD Debridement Type: Debridement Severity of Tissue Pre Debridement: Fat layer exposed Level of Consciousness (Pre-procedure): Awake and Alert Pre-procedure Verification/Time Out Yes - 09:39 Taken: Start Time: 09:39 Pain Control: Lidocaine 4% T opical Solution Percent of Wound Bed Debrided: 100% T Area Debrided (cm): otal 4 Tissue and other material debrided: Non-Viable, Slough, Slough Level: Non-Viable Tissue Debridement Description: Selective/Open Wound Instrument: Curette Bleeding: Minimum Hemostasis Achieved: Pressure Response to Treatment: Procedure was tolerated well Level of Consciousness (Post- Awake and Alert procedure): Post Debridement Measurements of Total Wound Length: (cm) 3 Width: (cm) 1.7 Depth: (cm) 0.1 Volume: (  cm) 0.401 Character of Wound/Ulcer Post Debridement: Improved Severity of Tissue Post Debridement: Fat layer exposed Post Procedure Diagnosis Same as Pre-procedure Notes VIRJEAN, BRECKER ([id number]) [id number] Page 5 of 14 scribed for Dr. Lady Gary by Samuella Bruin, RN Electronic Signature(s) Signed: 01/06/2023 10:50:39 AM By: Duanne Guess MD FACS Signed:  01/06/2023 3:06:27 PM By: Samuella Bruin Entered By: Samuella Bruin on 01/06/2023 09:42:41 -------------------------------------------------------------------------------- HPI Details Patient Name: Date of Service: [name], [name] 01/06/2023 9:15 A M Medical Record Number: [id number] Patient Account Number: [id number] Date of Birth/Sex: Treating RN: 05-18-1941 (82 y.o. F) Primary Care Provider: [name] Other Clinician: Referring Provider: Treating Provider/Extender: [name] Weeks in Treatment: 5 History of Present Illness HPI Description: ADMISSION 11/26/2022 This is an 82 year old woman who was referred by her primary care provider for a persistent nonhealing wound on her left lower extremity. It has been present since February of this year. 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 I do not see any formal venous reflux studies in the electronic medical record. She is not diabetic. She does not smoke. Her PCP has been washing her leg each week and applying a Kerlix and Coban wrap, but has not performed any formal debridement and has not been using any sort of topical contact layer or ointment. ABI in clinic was noncompressible, but she has a palpable pedal pulse. 12/10/2022: The wound is a bit smaller and cleaner today. There is still fairly heavy slough on the surface. Edema control is acceptable. 12/16/2022: The wound is measuring the slightest bit smaller today. There is still thick slough on the surface. Edema control is good. 12/23/2022: The wound is a little bit smaller again today. Still with fairly heavy slough accumulation. Edema control is good. She has a new small wound on the proximal portion of the same leg. It looks as though she may have developed a small blister over the weekend subsequently ruptured. There is minimal slough on the  surface. 12/30/2022: The wound is about the same size, but substantially cleaner. There is some hypertrophic granulation tissue starting to emerge. The wound that was new last week has closed but she has a different new wound today on the lateral aspect of her left lower leg. It also appears to have been a blister that opened and ruptured. There is a little bit of slough on the surface. 01/06/2023: She has new wounds today. She has a wound on the PIP knuckle of her left third and fourth toes, as well as an anterior tibial wound. She says that the wrap was too tight and it also looks like when she pushes her foot into her shoe, it shifts the wrapping back to her midfoot causing bunching up of the wrap material and pain. The lateral leg wounds are both smaller with a little slough and eschar present. The original medial leg wound is smaller and more superficial. There is slough on the surface. Electronic Signature(s) Signed: 01/06/2023 9:51:52 AM By: Duanne Guess MD FACS Entered By: Duanne Guess on 01/06/2023 09:51:52 -------------------------------------------------------------------------------- Physical Exam Details Patient Name: Date of Service: [name], [name] 01/06/2023 9:15 A M Medical Record Number: [id number] Patient Account Number: [id number] Date of Birth/Sex: Treating RN: Aug 01, 1940 (82 y.o. F) Primary Care Provider: [name] Other Clinician: Referring Provider: Treating Provider/Extender: [name], [name] in Treatment: 5 Constitutional Hypertensive, asymptomatic;  she was in pain when the measurement was taken. . . . no acute distress. Respiratory Normal work of breathing on room air. MONIYA, MEDITZ ([id number]) [id number] Page 6 of 14 Notes 01/06/2023: She has new wounds today. She has a wound on the PIP knuckle of her left third and fourth toes, as well as an anterior tibial wound. The lateral leg wounds  are both smaller with a little slough and eschar present. The original medial leg wound is smaller and more superficial. There is slough on the surface. Electronic Signature(s) Signed: 01/06/2023 9:54:28 AM By: Duanne Guess MD FACS Entered By: Duanne Guess on 01/06/2023 09:54:28 -------------------------------------------------------------------------------- Physician Orders Details Patient Name: Date of Service: [name], [name] 01/06/2023 9:15 A M Medical Record Number: [id number] Patient Account Number: [id number] Date of Birth/Sex: Treating RN: 23-Jan-1941 (82 y.o. Erin [name] Primary Care Provider: [name] Other Clinician: Referring Provider: Treating Provider/Extender: [name] Weeks in Treatment: 5 Verbal / Phone Orders: No Diagnosis Coding ICD-10 Coding Code Description [phone] Non-pressure chronic ulcer of other part of left lower leg with fat layer exposed I87.332 Chronic venous hypertension (idiopathic) with ulcer and inflammation of left lower extremity I10 Essential (primary) hypertension Follow-up Appointments ppointment in 1 week. - Dr. Lady Gary - room 2 Return A Anesthetic (In clinic) Topical Lidocaine 4% applied to wound bed Bathing/ Shower/ Hygiene May shower with protection but do not get wound dressing(s) wet. Protect dressing(s) with water repellant cover (for example, large plastic bag) or a cast cover and may then take shower. Edema Control - Lymphedema / SCD / Other Elevate legs to the level of the heart or above for 30 minutes daily and/or when sitting for 3-4 times a day throughout the day. Avoid standing for long periods of time. Wound Treatment Wound #1 - Lower Leg Wound Laterality: Left, Medial Cleanser: Soap and Water 1 x Per Week/30 Days Discharge Instructions: May shower and wash wound with dial antibacterial soap and water prior to dressing change. Cleanser: Wound Cleanser 1 x Per Week/30  Days Discharge Instructions: Cleanse the wound with wound cleanser prior to applying a clean dressing using gauze sponges, not tissue or cotton balls. Peri-Wound Care: Sween Lotion (Moisturizing lotion) 1 x Per Week/30 Days Discharge Instructions: Apply moisturizing lotion as directed Prim Dressing: Maxorb Extra Ag+ Alginate Dressing, 2x2 (in/in) 1 x Per Week/30 Days ary Discharge Instructions: Apply to wound bed as instructed Secondary Dressing: ABD Pad, 5x9 1 x Per Week/30 Days Discharge Instructions: Apply over primary dressing as directed. Secondary Dressing: Woven Gauze Sponge, Non-Sterile 4x4 in 1 x Per Week/30 Days Discharge Instructions: Apply over primary dressing as directed. Secured With: Elastic Bandage 4 inch (ACE bandage) 1 x Per Week/30 Days Discharge Instructions: Secure with ACE bandage as directed. Secured With: [employer], 4.5x3.1 (in/yd) 1 x Per Week/30 Days Discharge Instructions: Secure with Kerlix as directed. VIVIANNE, DRESCHER ([id number]) [id number] Page 7 of 14 Wound #3 - Lower Leg Wound Laterality: Left, Lateral Cleanser: Soap and Water 1 x Per Week/30 Days Discharge Instructions: May shower and wash wound with dial antibacterial soap and water prior to dressing change. Cleanser: Wound Cleanser 1 x Per Week/30 Days Discharge Instructions: Cleanse the wound with wound cleanser prior to applying a clean dressing using gauze sponges, not tissue or cotton balls. Peri-Wound Care: Sween Lotion (Moisturizing lotion) 1 x Per Week/30 Days Discharge Instructions: Apply moisturizing lotion as directed Prim Dressing: Maxorb Extra Ag+ Alginate Dressing, 2x2 (in/in)  1 x Per Week/30 Days ary Discharge Instructions: Apply to wound bed as instructed Secondary Dressing: Woven Gauze Sponge, Non-Sterile 4x4 in 1 x Per Week/30 Days Discharge Instructions: Apply over primary dressing as directed. Secured With: Elastic Bandage 4 inch (ACE bandage)  1 x Per Week/30 Days Discharge Instructions: Secure with ACE bandage as directed. Secured With: [employer], 4.5x3.1 (in/yd) 1 x Per Week/30 Days Discharge Instructions: Secure with Kerlix as directed. Wound #4 - Lower Leg Wound Laterality: Left, Anterior Cleanser: Soap and Water 1 x Per Week/30 Days Discharge Instructions: May shower and wash wound with dial antibacterial soap and water prior to dressing change. Cleanser: Wound Cleanser 1 x Per Week/30 Days Discharge Instructions: Cleanse the wound with wound cleanser prior to applying a clean dressing using gauze sponges, not tissue or cotton balls. Peri-Wound Care: Sween Lotion (Moisturizing lotion) 1 x Per Week/30 Days Discharge Instructions: Apply moisturizing lotion as directed Prim Dressing: Maxorb Extra Ag+ Alginate Dressing, 2x2 (in/in) 1 x Per Week/30 Days ary Discharge Instructions: Apply to wound bed as instructed Secondary Dressing: ABD Pad, 5x9 1 x Per Week/30 Days Discharge Instructions: Apply over primary dressing as directed. Secondary Dressing: Woven Gauze Sponge, Non-Sterile 4x4 in 1 x Per Week/30 Days Discharge Instructions: Apply over primary dressing as directed. Secured With: Elastic Bandage 4 inch (ACE bandage) 1 x Per Week/30 Days Discharge Instructions: Secure with ACE bandage as directed. Secured With: [employer], 4.5x3.1 (in/yd) 1 x Per Week/30 Days Discharge Instructions: Secure with Kerlix as directed. Wound #5 - T Third oe Wound Laterality: Left Cleanser: Soap and Water Every Other Day/30 Days Discharge Instructions: May shower and wash wound with dial antibacterial soap and water prior to dressing change. Cleanser: Wound Cleanser Every Other Day/30 Days Discharge Instructions: Cleanse the wound with wound cleanser prior to applying a clean dressing using gauze sponges, not tissue or cotton balls. Peri-Wound Care: Sween Lotion (Moisturizing lotion) Every Other Day/30 Days Discharge  Instructions: Apply moisturizing lotion as directed Prim Dressing: Maxorb Extra Ag+ Alginate Dressing, 2x2 (in/in) Every Other Day/30 Days ary Discharge Instructions: Apply to wound bed as instructed Secondary Dressing: Woven Gauze Sponge, Non-Sterile 4x4 in Every Other Day/30 Days Discharge Instructions: Apply over primary dressing as directed. Secured With: 59M Medipore H Soft Cloth Surgical T ape, 4 x 10 (in/yd) Every Other Day/30 Days Discharge Instructions: Secure with tape as directed. Wound #6 - T Fourth oe Wound Laterality: Left Cleanser: Soap and Water Every Other Day/30 Days Discharge Instructions: May shower and wash wound with dial antibacterial soap and water prior to dressing change. Cleanser: Wound Cleanser Every Other Day/30 Days Discharge Instructions: Cleanse the wound with wound cleanser prior to applying a clean dressing using gauze sponges, not tissue or cotton balls. Peri-Wound Care: Sween Lotion (Moisturizing lotion) Every Other Day/30 Days Discharge Instructions: Apply moisturizing lotion as directed SHONICA, CRAVER ([id number]) [id number] Page 8 of 14 Prim Dressing: Maxorb Extra Ag+ Alginate Dressing, 2x2 (in/in) Every Other Day/30 Days ary Discharge Instructions: Apply to wound bed as instructed Secondary Dressing: Woven Gauze Sponge, Non-Sterile 4x4 in Every Other Day/30 Days Discharge Instructions: Apply over primary dressing as directed. Secured With: 59M [employer] H Soft Cloth Surgical T ape, 4 x 10 (in/yd) Every Other Day/30 Days Discharge Instructions: Secure with tape as directed. Patient Medications llergies: codeine, morphine A Notifications Medication Indication Start End 01/06/2023 lidocaine DOSE topical 4 % cream - cream topical Electronic Signature(s) Signed: 01/06/2023 10:50:39 AM By: Duanne Guess MD FACS Entered By: Lady Gary,  Kelani Robart on 01/06/2023  09:54:46 -------------------------------------------------------------------------------- Problem List Details Patient Name: Date of Service: [name], [name] RBA RA N. 01/06/2023 9:15 A M Medical Record Number: [id number] Patient Account Number: [id number] Date of Birth/Sex: Treating RN: 1941-02-08 (82 y.o. F) Primary Care Provider: [name] Other Clinician: Referring Provider: Treating Provider/Extender: [name], Serafina Royals Weeks in Treatment: 5 Active Problems ICD-10 Encounter Code Description Active Date MDM Diagnosis L97.822 Non-pressure chronic ulcer of other part of left lower leg with fat layer exposed5/22/2024 No Yes I87.332 Chronic venous hypertension (idiopathic) with ulcer and inflammation of left 11/26/2022 No Yes lower extremity I10 Essential (primary) hypertension 11/26/2022 No Yes Inactive Problems Resolved Problems Electronic Signature(s) Signed: 01/06/2023 9:49:26 AM By: Duanne Guess MD FACS Entered By: Duanne Guess on 01/06/2023 09:49:26 [name] ([id number]) [id number] Page 9 of 14 -------------------------------------------------------------------------------- Progress Note Details Patient Name: Date of Service: [name], [name] 01/06/2023 9:15 A M Medical Record Number: [id number] Patient Account Number: [id number] Date of Birth/Sex: Treating RN: [birth year] (82 y.o. F) Primary Care Provider: [name] Other Clinician: Referring Provider: Treating Provider/Extender: [name], Serafina Royals Weeks in Treatment: 5 Subjective Chief Complaint Information obtained from Patient Patient presents for treatment of an open ulcer due to venous insufficiency History of Present Illness (HPI) ADMISSION 11/26/2022 This is an 82 year old woman who was referred by her primary care provider for a persistent nonhealing wound on her left lower extremity. It has been present since February  of this year. 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 I do not see any formal venous reflux studies in the electronic medical record. She is not diabetic. She does not smoke. Her PCP has been washing her leg each week and applying a Kerlix and Coban wrap, but has not performed any formal debridement and has not been using any sort of topical contact layer or ointment. ABI in clinic was noncompressible, but she has a palpable pedal pulse. 12/10/2022: The wound is a bit smaller and cleaner today. There is still fairly heavy slough on the surface. Edema control is acceptable. 12/16/2022: The wound is measuring the slightest bit smaller today. There is still thick slough on the surface. Edema control is good. 12/23/2022: The wound is a little bit smaller again today. Still with fairly heavy slough accumulation. Edema control is good. She has a new small wound on the proximal portion of the same leg. It looks as though she may have developed a small blister over the weekend subsequently ruptured. There is minimal slough on the surface. 12/30/2022: The wound is about the same size, but substantially cleaner. There is some hypertrophic granulation tissue starting to emerge. The wound that was new last week has closed but she has a different new wound today on the lateral aspect of her left lower leg. It also appears to have been a blister that opened and ruptured. There is a little bit of slough on the surface. 01/06/2023: She has new wounds today. She has a wound on the PIP knuckle of her left third and fourth toes, as well as an anterior tibial wound. She says that the wrap was too tight and it also looks like when she pushes her foot into her shoe, it shifts the wrapping back to her midfoot causing bunching up of the wrap material and pain. The lateral leg wounds are both smaller with a little slough and  eschar  present. The original medial leg wound is smaller and more superficial. There is slough on the surface. Patient History Family History Cancer - Siblings, Lung Disease - Siblings, No family history of Diabetes, Heart Disease, Hereditary Spherocytosis, Hypertension, Kidney Disease, Seizures, Stroke, Thyroid Problems, Tuberculosis. Social History Never smoker, Marital Status - Widowed, Alcohol Use - Never, Drug Use - No History, Caffeine Use - Moderate. Medical History Eyes Patient has history of Cataracts Cardiovascular Patient has history of Arrhythmia - bundle branch block, Hypertension, Peripheral Venous Disease Musculoskeletal Patient has history of Gout, Osteoarthritis Hospitalization/Surgery History - Salpingoophorectomy. - Colon resection. - Appendectomy. - Cystoscopy w/ retrogrades. - Joint replacement. - Mastectomy right. - gastric ulcer repair. - Abdominal hysterectomy. Medical A Surgical History Notes nd Hematologic/Lymphatic thrombocytopenia Gastrointestinal diverticulosis, GERD Endocrine hypothyroidism Genitourinary CKD stage III Musculoskeletal DJD, osteoporosis Oncologic breast cancer ALVIRA, MUHA ([id number]) [id number] Page 10 of 14 Objective Constitutional Hypertensive, asymptomatic; she was in pain when the measurement was taken. no acute distress. Vitals Time Taken: 9:13 AM, Height: 60 in, Weight: 138 lbs, BMI: 26.9, Temperature: 97.9 F, Pulse: 77 bpm, Respiratory Rate: 18 breaths/min, Blood Pressure: 228/85 mmHg. Respiratory Normal work of breathing on room air. General Notes: 01/06/2023: She has new wounds today. She has a wound on the PIP knuckle of her left third and fourth toes, as well as an anterior tibial wound. The lateral leg wounds are both smaller with a little slough and eschar present. The original medial leg wound is smaller and more superficial. There is slough on the surface. Integumentary (Hair, Skin) Wound  #1 status is Open. Original cause of wound was Gradually Appeared. The date acquired was: 08/29/2022. The wound has been in treatment 5 weeks. The wound is located on the Left,Medial Lower Leg. The wound measures 3cm length x 1.7cm width x 0.1cm depth; 4.006cm^2 area and 0.401cm^3 volume. There is Fat Layer (Subcutaneous Tissue) exposed. There is no tunneling or undermining noted. There is a medium amount of serosanguineous drainage noted. The wound margin is distinct with the outline attached to the wound base. There is medium (34-66%) red, hyper - granulation within the wound bed. There is a medium (34-66%) amount of necrotic tissue within the wound bed including Adherent Slough. The periwound skin appearance had no abnormalities noted for texture. The periwound skin appearance had no abnormalities noted for moisture. The periwound skin appearance had no abnormalities noted for color. Periwound temperature was noted as No Abnormality. The periwound has tenderness on palpation. Wound #3 status is Open. Original cause of wound was Blister. The date acquired was: 12/30/2022. The wound has been in treatment 1 weeks. The wound is located on the Left,Lateral Lower Leg. The wound measures 0.4cm length x 0.3cm width x 0.1cm depth; 0.094cm^2 area and 0.009cm^3 volume. There is Fat Layer (Subcutaneous Tissue) exposed. There is no tunneling or undermining noted. There is a small amount of serous drainage noted. The wound margin is distinct with the outline attached to the wound base. There is large (67-100%) red granulation within the wound bed. There is a small (1-33%) amount of necrotic tissue within the wound bed including Adherent Slough. The periwound skin appearance had no abnormalities noted for texture. The periwound skin appearance had no abnormalities noted for moisture. The periwound skin appearance exhibited: Hemosiderin Staining. Periwound temperature was noted as No Abnormality. Wound #4 status is  Open. Original cause of wound was Gradually Appeared. The date acquired was: 01/06/2023. The wound is located on the Left,Anterior Lower Leg. The  wound measures 1.2cm length x 0.7cm width x 0.1cm depth; 0.66cm^2 area and 0.066cm^3 volume. There is Fat Layer (Subcutaneous Tissue) exposed. There is no tunneling or undermining noted. There is a medium amount of purulent drainage noted. The wound margin is distinct with the outline attached to the wound base. There is medium (34-66%) red granulation within the wound bed. There is a medium (34-66%) amount of necrotic tissue within the wound bed including Adherent Slough. The periwound skin appearance had no abnormalities noted for texture. The periwound skin appearance had no abnormalities noted for moisture. The periwound skin appearance exhibited: Hemosiderin Staining. Periwound temperature was noted as No Abnormality. Wound #5 status is Open. Original cause of wound was Gradually Appeared. The date acquired was: 01/06/2023. The wound is located on the Left T Third. The oe wound measures 0.3cm length x 0.3cm width x 0.1cm depth; 0.071cm^2 area and 0.007cm^3 volume. There is Fat Layer (Subcutaneous Tissue) exposed. There is no tunneling or undermining noted. There is a medium amount of serous drainage noted. The wound margin is distinct with the outline attached to the wound base. There is large (67-100%) red granulation within the wound bed. There is a small (1-33%) amount of necrotic tissue within the wound bed. The periwound skin appearance had no abnormalities noted for texture. The periwound skin appearance had no abnormalities noted for moisture. The periwound skin appearance exhibited: Rubor. Periwound temperature was noted as No Abnormality. Wound #6 status is Open. Original cause of wound was Gradually Appeared. The date acquired was: 01/06/2023. The wound is located on the Left T Fourth. The oe wound measures 0.2cm length x 0.3cm width x 0.1cm depth;  0.047cm^2 area and 0.005cm^3 volume. There is Fat Layer (Subcutaneous Tissue) exposed. There is no tunneling or undermining noted. There is a medium amount of serous drainage noted. The wound margin is distinct with the outline attached to the wound base. There is large (67-100%) red granulation within the wound bed. There is a small (1-33%) amount of necrotic tissue within the wound bed including Adherent Slough. The periwound skin appearance had no abnormalities noted for texture. The periwound skin appearance had no abnormalities noted for moisture. The periwound skin appearance exhibited: Rubor. Periwound temperature was noted as No Abnormality. 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 There was a Selective/Open Wound Non-Viable Tissue Debridement with a total area of 4 sq cm performed by Duanne Guess, MD. With the following instrument(s): Curette to remove Non-Viable tissue/material. Material removed includes [facility] after achieving pain control using Lidocaine 4% Topical Solution. No specimens were taken. A time out was conducted at 09:39, prior to the start of the procedure. A Minimum amount of bleeding was controlled with Pressure. The procedure was tolerated well. Post Debridement Measurements: 3cm length x 1.7cm width x 0.1cm depth; 0.401cm^3 volume. Character of Wound/Ulcer Post Debridement is improved. Severity of Tissue Post Debridement is: Fat layer exposed. Post procedure Diagnosis Wound #1: Same as Pre-Procedure General Notes: scribed for Dr. Lady Gary by Samuella Bruin, RN. Wound #3 Pre-procedure diagnosis  of Wound #3 is a Venous Leg Ulcer located on the Left,Lateral Lower Leg .Severity of Tissue Pre Debridement is: Fat layer exposed. LILIANY, WITHINGTON ([id number])  [phone].pdf Page 11 of 14 There was a Selective/Open Wound Non-Viable Tissue Debridement with a total area of 0.09 sq cm performed by Duanne Guess, MD. With the following instrument(s): Curette to remove Non-Viable tissue/material. Material removed includes Eschar after achieving pain control using Lidocaine 4% Topical Solution. No specimens were taken. A time out was conducted at 09:39, prior to the start of the procedure. A Minimum amount of bleeding was controlled with Pressure. The procedure was tolerated well. Post Debridement Measurements: 0.4cm length x 0.3cm width x 0.1cm depth; 0.009cm^3 volume. Character of Wound/Ulcer Post Debridement is improved. Severity of Tissue Post Debridement is: Fat layer exposed. Post procedure Diagnosis Wound #3: Same as Pre-Procedure General Notes: scribed for Dr. Lady Gary by Samuella Bruin, RN. Wound #4 Pre-procedure diagnosis of Wound #4 is a Venous Leg Ulcer located on the Left,Anterior Lower Leg .Severity of Tissue Pre Debridement is: Fat layer exposed. There was a Selective/Open Wound Non-Viable Tissue Debridement with a total area of 0.66 sq cm performed by Duanne Guess, MD. With the following instrument(s): Curette to remove Non-Viable tissue/material. Material removed includes Eschar and Slough and after achieving pain control using Lidocaine 4% Topical Solution. No specimens were taken. A time out was conducted at 09:39, prior to the start of the procedure. A Minimum amount of bleeding was controlled with Pressure. The procedure was tolerated well. Post Debridement Measurements: 1.2cm length x 0.7cm width x 0.1cm depth; 0.066cm^3 volume. Character of Wound/Ulcer Post Debridement is improved. Severity of Tissue Post Debridement is: Fat layer exposed. Post  procedure Diagnosis Wound #4: Same as Pre-Procedure General Notes: scribed for Dr. Lady Gary by Samuella Bruin, RN. Wound #5 Pre-procedure diagnosis of Wound #5 is a Lymphedema located on the Left T Third . There was a Selective/Open Wound Non-Viable Tissue Debridement with oe a total area of 0.07 sq cm performed by Duanne Guess, MD. With the following instrument(s): Curette to remove Non-Viable tissue/material. Material removed includes [facility] after achieving pain control using Lidocaine 4% Topical Solution. No specimens were taken. A time out was conducted at 09:39, prior to the start of the procedure. A Minimum amount of bleeding was controlled with Pressure. The procedure was tolerated well. Post Debridement Measurements: 0.3cm length x 0.3cm width x 0.1cm depth; 0.007cm^3 volume. Character of Wound/Ulcer Post Debridement is improved. Post procedure Diagnosis Wound #5: Same as Pre-Procedure General Notes: scribed for Dr. Lady Gary by Samuella Bruin, RN. Wound #6 Pre-procedure diagnosis of Wound #6 is a Lymphedema located on the Left T Fourth . There was a Selective/Open Wound Non-Viable Tissue Debridement oe with a total area of 0.05 sq cm performed by Duanne Guess, MD. With the following instrument(s): Curette to remove Non-Viable tissue/material. Material removed includes [facility] after achieving pain control using Lidocaine 4% Topical Solution. No specimens were taken. A time out was conducted at 09:39, prior to the start of the procedure. A Minimum amount of bleeding was controlled with Pressure. The procedure was tolerated well. Post Debridement Measurements: 0.2cm length x 0.3cm width x 0.1cm depth; 0.005cm^3 volume. Character of Wound/Ulcer Post Debridement is improved. Post procedure Diagnosis Wound #6: Same as Pre-Procedure General Notes: scribed for Dr. Lady Gary by Samuella Bruin, RN. Plan Follow-up Appointments: Return Appointment in 1 week. - Dr. Lady Gary - room  2 Anesthetic: (In clinic) Topical Lidocaine 4% applied to wound bed Bathing/ Shower/ Hygiene: May shower with protection but do not get wound dressing(s) wet. Protect dressing(s) with water repellant cover (for example, large plastic bag) or a cast cover and may then take  shower. Edema Control - Lymphedema / SCD / Other: Elevate legs to the level of the heart or above for 30 minutes daily and/or when sitting for 3-4 times a day throughout the day. Avoid standing for long periods of time. The following medication(s) was prescribed: lidocaine topical 4 % cream cream topical was prescribed at facility WOUND #1: - Lower Leg Wound Laterality: Left, Medial Cleanser: Soap and Water 1 x Per Week/30 Days Discharge Instructions: May shower and wash wound with dial antibacterial soap and water prior to dressing change. Cleanser: Wound Cleanser 1 x Per Week/30 Days Discharge Instructions: Cleanse the wound with wound cleanser prior to applying a clean dressing using gauze sponges, not tissue or cotton balls. Peri-Wound Care: Sween Lotion (Moisturizing lotion) 1 x Per Week/30 Days Discharge Instructions: Apply moisturizing lotion as directed Prim Dressing: Maxorb Extra Ag+ Alginate Dressing, 2x2 (in/in) 1 x Per Week/30 Days ary Discharge Instructions: Apply to wound bed as instructed Secondary Dressing: ABD Pad, 5x9 1 x Per Week/30 Days Discharge Instructions: Apply over primary dressing as directed. Secondary Dressing: Woven Gauze Sponge, Non-Sterile 4x4 in 1 x Per Week/30 Days Discharge Instructions: Apply over primary dressing as directed. Secured With: Elastic Bandage 4 inch (ACE bandage) 1 x Per Week/30 Days Discharge Instructions: Secure with ACE bandage as directed. Secured With: [employer], 4.5x3.1 (in/yd) 1 x Per Week/30 Days Discharge Instructions: Secure with Kerlix as directed. WOUND #3: - Lower Leg Wound Laterality: Left, Lateral Cleanser: Soap and Water 1 x Per Week/30  Days Discharge Instructions: May shower and wash wound with dial antibacterial soap and water prior to dressing change. Cleanser: Wound Cleanser 1 x Per Week/30 Days Discharge Instructions: Cleanse the wound with wound cleanser prior to applying a clean dressing using gauze sponges, not tissue or cotton balls. Peri-Wound Care: Sween Lotion (Moisturizing lotion) 1 x Per Week/30 Days Discharge Instructions: Apply moisturizing lotion as directed Prim Dressing: Maxorb Extra Ag+ Alginate Dressing, 2x2 (in/in) 1 x Per Week/30 Days ary Discharge Instructions: Apply to wound bed as instructed Secondary Dressing: Woven Gauze Sponge, Non-Sterile 4x4 in 1 x Per Week/30 Days Discharge Instructions: Apply over primary dressing as directed. Secured With: Elastic Bandage 4 inch (ACE bandage) 1 x Per Week/30 Days Discharge Instructions: Secure with ACE bandage as directed. Secured With: [employer], 4.5x3.1 (in/yd) 1 x Per Week/30 Days Discharge Instructions: Secure with Kerlix as directed. WOUND #4: - Lower Leg Wound Laterality: Left, Anterior Cleanser: Soap and Water 1 x Per Week/30 Days Discharge Instructions: May shower and wash wound with dial antibacterial soap and water prior to dressing change. MARCINA, RISCH ([id number]) [id number] Page 12 of 14 Cleanser: Wound Cleanser 1 x Per Week/30 Days Discharge Instructions: Cleanse the wound with wound cleanser prior to applying a clean dressing using gauze sponges, not tissue or cotton balls. Peri-Wound Care: Sween Lotion (Moisturizing lotion) 1 x Per Week/30 Days Discharge Instructions: Apply moisturizing lotion as directed Prim Dressing: Maxorb Extra Ag+ Alginate Dressing, 2x2 (in/in) 1 x Per Week/30 Days ary Discharge Instructions: Apply to wound bed as instructed Secondary Dressing: ABD Pad, 5x9 1 x Per Week/30 Days Discharge Instructions: Apply over primary dressing as directed. Secondary Dressing: Woven Gauze  Sponge, Non-Sterile 4x4 in 1 x Per Week/30 Days Discharge Instructions: Apply over primary dressing as directed. Secured With: Elastic Bandage 4 inch (ACE bandage) 1 x Per Week/30 Days Discharge Instructions: Secure with ACE bandage as directed. Secured With: [employer], 4.5x3.1 (in/yd) 1 x Per Week/30 Days Discharge Instructions:  Secure with Kerlix as directed. WOUND #5: - T Third Wound Laterality: Left oe Cleanser: Soap and Water Every Other Day/30 Days Discharge Instructions: May shower and wash wound with dial antibacterial soap and water prior to dressing change. Cleanser: Wound Cleanser Every Other Day/30 Days Discharge Instructions: Cleanse the wound with wound cleanser prior to applying a clean dressing using gauze sponges, not tissue or cotton balls. Peri-Wound Care: Sween Lotion (Moisturizing lotion) Every Other Day/30 Days Discharge Instructions: Apply moisturizing lotion as directed Prim Dressing: Maxorb Extra Ag+ Alginate Dressing, 2x2 (in/in) Every Other Day/30 Days ary Discharge Instructions: Apply to wound bed as instructed Secondary Dressing: Woven Gauze Sponge, Non-Sterile 4x4 in Every Other Day/30 Days Discharge Instructions: Apply over primary dressing as directed. Secured With: 50M Medipore H Soft Cloth Surgical T ape, 4 x 10 (in/yd) Every Other Day/30 Days Discharge Instructions: Secure with tape as directed. WOUND #6: - T Fourth Wound Laterality: Left oe Cleanser: Soap and Water Every Other Day/30 Days Discharge Instructions: May shower and wash wound with dial antibacterial soap and water prior to dressing change. Cleanser: Wound Cleanser Every Other Day/30 Days Discharge Instructions: Cleanse the wound with wound cleanser prior to applying a clean dressing using gauze sponges, not tissue or cotton balls. Peri-Wound Care: Sween Lotion (Moisturizing lotion) Every Other Day/30 Days Discharge Instructions: Apply moisturizing lotion as directed Prim Dressing:  Maxorb Extra Ag+ Alginate Dressing, 2x2 (in/in) Every Other Day/30 Days ary Discharge Instructions: Apply to wound bed as instructed Secondary Dressing: Woven Gauze Sponge, Non-Sterile 4x4 in Every Other Day/30 Days Discharge Instructions: Apply over primary dressing as directed. Secured With: 50M Medipore H Soft Cloth Surgical T ape, 4 x 10 (in/yd) Every Other Day/30 Days Discharge Instructions: Secure with tape as directed. 01/06/2023: She has new wounds today. She has a wound on the PIP knuckle of her left third and fourth toes, as well as an anterior tibial wound. The lateral leg wounds are both smaller with a little slough and eschar present. The original medial leg wound is smaller and more superficial. There is slough on the surface. I used a curette to debride slough from each of the new toe wounds, slough and eschar from the lateral leg wounds, slough and dry skin from the new anterior tibial wound, and slough from the original medial leg wound. Will continue Hydrofera Blue to the medial leg wound and silver alginate to all of the other wounds. I had hoped that additional compression would help accelerate the healing process, but it seems to have been detrimental instead. Will use Kerlix and an Ace bandage to provide some light compression with more flexibility. We will also give her surgical sandal to avoid her wrap being pushed back by her shoe. Follow-up in 1 week. Electronic Signature(s) Signed: 01/06/2023 9:56:45 AM By: Duanne Guess MD FACS Previous Signature: 01/06/2023 9:56:14 AM Version By: Duanne Guess MD FACS Entered By: Duanne Guess on 01/06/2023 09:56:45 -------------------------------------------------------------------------------- HxROS Details Patient Name: Date of Service: [name], [name] 01/06/2023 9:15 A M Medical Record Number: [id number] Patient Account Number: [id number] Date of Birth/Sex: Treating RN: 12-Feb-1941 (82 y.o. F) Primary Care Provider: [name] Other Clinician: Referring Provider: Treating Provider/Extender: [name], [name] in Treatment: 5 Eyes Medical History: Positive for: Cataracts Hematologic/Lymphatic Medical History: KIZZEY, ARMBRUST ([id number]) [id number] Page 13 of 14 Past Medical History Notes: thrombocytopenia Cardiovascular Medical History: Positive for: Arrhythmia - bundle branch block; Hypertension; Peripheral Venous Disease Gastrointestinal Medical History: Past  Medical History Notes: diverticulosis, GERD Endocrine Medical History: Past Medical History Notes: hypothyroidism Genitourinary Medical History: Past Medical History Notes: CKD stage III Musculoskeletal Medical History: Positive for: Gout; Osteoarthritis Past Medical History Notes: DJD, osteoporosis Oncologic Medical History: Past Medical History Notes: breast cancer HBO Extended History Items Eyes: Cataracts Immunizations Pneumococcal Vaccine: Received Pneumococcal Vaccination: Yes Received Pneumococcal Vaccination On or After 60th Birthday: No Implantable Devices No devices added Hospitalization / Surgery History Type of Hospitalization/Surgery Salpingoophorectomy Colon resection Appendectomy Cystoscopy w/ retrogrades Joint replacement Mastectomy right gastric ulcer repair Abdominal hysterectomy Family and Social History Cancer: Yes - Siblings; Diabetes: No; Heart Disease: No; Hereditary Spherocytosis: No; Hypertension: No; Kidney Disease: No; Lung Disease: Yes - Siblings; Seizures: No; Stroke: No; Thyroid Problems: No; Tuberculosis: No; Never smoker; Marital Status - Widowed; Alcohol Use: Never; Drug Use: No History; Caffeine Use: Moderate; Financial Concerns: No; Food, Clothing or Shelter Needs: No; Support System Lacking: No; Transportation Concerns: No Electronic Signature(s) Signed: 01/06/2023 10:50:39 AM By: Duanne Guess MD FACS Entered By:  Duanne Guess on 01/06/2023 09:53:33 [name] ([id number]) [id number] Page 14 of 14 -------------------------------------------------------------------------------- SuperBill Details Patient Name: Date of Service: [name], [name] 01/06/2023 Medical Record Number: [id number] Patient Account Number: [id number] Date of Birth/Sex: Treating RN: [birth year] (82 y.o. F) Primary Care Provider: [name] Other Clinician: Referring Provider: Treating Provider/Extender: [name], [name] in Treatment: 5 Diagnosis Coding ICD-10 Codes Code Description [phone] Non-pressure chronic ulcer of other part of left lower leg with fat layer exposed I87.332 Chronic venous hypertension (idiopathic) with ulcer and inflammation of left lower extremity I10 Essential (primary) hypertension Facility Procedures : CPT4 Code: [id number] Description: 97597 - DEBRIDE WOUND 1ST 20 SQ CM OR < ICD-10 Diagnosis Description L97.822 Non-pressure chronic ulcer of other part of left lower leg with fat layer exposed Modifier: Quantity: 1 Physician Procedures : CPT4 Code Description Modifier [id number] - WC PHYS LEVEL 4 - EST PT 25 ICD-10 Diagnosis Description L97.822 Non-pressure chronic ulcer of other part of left lower leg with fat layer exposed I87.332 Chronic venous hypertension (idiopathic) with  ulcer and inflammation of left lower extremity I10 Essential (primary) hypertension Quantity: 1 : [id number] 97597 - WC PHYS DEBR WO ANESTH 20 SQ CM ICD-10 Diagnosis Description L97.822 Non-pressure chronic ulcer of other part of left lower leg with fat layer exposed Quantity: 1 Electronic Signature(s) Signed: 01/06/2023 9:57:06 AM By: Duanne Guess MD FACS Entered By: Duanne Guess on 01/06/2023 09:57:05

## 2023-01-13 ENCOUNTER — Encounter (HOSPITAL_BASED_OUTPATIENT_CLINIC_OR_DEPARTMENT_OTHER): Payer: Medicare HMO | Admitting: General Surgery

## 2023-01-13 DIAGNOSIS — E039 Hypothyroidism, unspecified: Secondary | ICD-10-CM | POA: Diagnosis not present

## 2023-01-13 DIAGNOSIS — I872 Venous insufficiency (chronic) (peripheral): Secondary | ICD-10-CM | POA: Diagnosis not present

## 2023-01-13 DIAGNOSIS — M109 Gout, unspecified: Secondary | ICD-10-CM | POA: Diagnosis not present

## 2023-01-13 DIAGNOSIS — N183 Chronic kidney disease, stage 3 unspecified: Secondary | ICD-10-CM | POA: Diagnosis not present

## 2023-01-13 DIAGNOSIS — I129 Hypertensive chronic kidney disease with stage 1 through stage 4 chronic kidney disease, or unspecified chronic kidney disease: Secondary | ICD-10-CM | POA: Diagnosis not present

## 2023-01-13 DIAGNOSIS — L97822 Non-pressure chronic ulcer of other part of left lower leg with fat layer exposed: Secondary | ICD-10-CM | POA: Diagnosis not present

## 2023-01-13 DIAGNOSIS — S90812A Abrasion, left foot, initial encounter: Secondary | ICD-10-CM | POA: Diagnosis not present

## 2023-01-13 DIAGNOSIS — I890 Lymphedema, not elsewhere classified: Secondary | ICD-10-CM | POA: Diagnosis not present

## 2023-01-13 DIAGNOSIS — I87332 Chronic venous hypertension (idiopathic) with ulcer and inflammation of left lower extremity: Secondary | ICD-10-CM | POA: Diagnosis not present

## 2023-01-13 DIAGNOSIS — L97522 Non-pressure chronic ulcer of other part of left foot with fat layer exposed: Secondary | ICD-10-CM | POA: Diagnosis not present

## 2023-01-13 NOTE — Progress Notes (Signed)
[name], [name] ([id number]) [id number] Page 1 of 19 Visit Report for 01/13/2023 Arrival Information Details Patient Name: Date of Service: NO RMA [name], [name] [name]. 01/13/2023 2:15 PM Medical Record Number: [id number] Patient Account Number: [id number] Date of Birth/Sex: Treating RN: [birth year] (82 y.o. F) Primary Care Erin [name]: Erin [name] Other Clinician: Referring Sophonie [name]: Treating Erin [name]/Extender: Erin [name]: 6 Visit Information History Since Last Visit All ordered tests and consults were completed: No Patient Arrived: Ambulatory Added or deleted any medications: No Arrival Time: 14:18 Any new allergies or adverse reactions: No Accompanied By: daughter Had a fall or experienced change in No Transfer Assistance: None activities of daily living that may affect Patient Identification Verified: Yes risk of falls: Secondary Verification Process Completed: Yes Signs or symptoms of abuse/neglect since last visito No Patient Requires Transmission-Based Precautions: No Hospitalized since last visit: No Patient Has Alerts: No Implantable device outside of the clinic excluding No cellular tissue based products placed in the center since last visit: Pain Present Now: No Electronic Signature(s) Signed: 01/13/2023 3:48:39 PM By: Erin [name] Entered By: Erin [name] on 01/13/2023 14:18:25 -------------------------------------------------------------------------------- Encounter Discharge Information Details Patient Name: Date of Service: NO RMA [name], [name]. 01/13/2023 2:15 PM Medical Record Number: [id number] Patient Account Number: [id number] Date of Birth/Sex: Treating RN: 09/27/1940 (82 y.o. Erin [name] Primary Care Marialuiza Car: Erin [name] Other Clinician: Referring Erin [name]: Treating Erin [name]/Extender: Erin [name]: 6 Encounter Discharge Information  Items Post Procedure Vitals Discharge Condition: Stable Temperature (F): 98.1 Ambulatory Status: Ambulatory Pulse (bpm): 64 Discharge Destination: Home Respiratory Rate (breaths/min): 18 Transportation: Private Auto Blood Pressure (mmHg): 204/73 Accompanied By: daughter Schedule Follow-up Appointment: Yes Clinical Summary of Care: Patient Declined Electronic Signature(s) Signed: 01/13/2023 3:45:14 PM By: Erin [name] Entered By: Erin [name] on 01/13/2023 15:15:35 Erin [name] ([id number]) [id number] Page 2 of 19 -------------------------------------------------------------------------------- Lower Extremity Assessment Details Patient Name: Date of Service: NO RMA [name], [name]. 01/13/2023 2:15 PM Medical Record Number: [id number] Patient Account Number: [id number] Date of Birth/Sex: Treating RN: January 03, 1941 (82 y.o. Erin [name] Primary Care Erin [name]: Erin [name] Other Clinician: Referring Erin [name]: Treating Erin [name]/Extender: Erin [name], Erin [name] in [name]: 6 Edema Assessment Assessed: [Left: No] [Right: No] [Left: Edema] [Right: :] Calf Left: Right: Point of Measurement: From Medial Instep 30 cm Ankle Left: Right: Point of Measurement: From Medial Instep 19.5 cm Vascular Assessment Pulses: Dorsalis Pedis Palpable: [Left:Yes] Electronic Signature(s) Signed: 01/13/2023 3:45:14 PM By: Erin [name] Entered By: Erin [name] on 01/13/2023 14:25:27 -------------------------------------------------------------------------------- Multi Wound Chart Details Patient Name: Date of Service: NO RMA [name], [name]. 01/13/2023 2:15 PM Medical Record Number: [id number] Patient Account Number: [id number] Date of Birth/Sex: Treating RN: Apr 02, 1941 (82 y.o. 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 Primary Etiology: Cataracts, Arrhythmia, Hypertension, Cataracts, Arrhythmia, Hypertension, Cataracts, Arrhythmia, Hypertension, Comorbid History: Peripheral Venous Disease, Gout, Peripheral Venous Disease, Gout, Peripheral Venous Disease, Gout, Osteoarthritis Osteoarthritis Osteoarthritis 08/29/2022 12/30/2022 01/06/2023 Date Acquired: 6 2 1  [name] of [name]: Open  Open Open Wound Status: No No No Wound Recurrence: 3.3x2x0.1 0x0x0 2x2x0.1 Measurements L x W x D (cm) 5.184 0 3.142 A (cm) : rea 0.518 0 0.314 Volume (cm) : 11.80% 100.00% -376.10% % Reduction in A rea: 11.80% 100.00% -375.80% % Reduction in Volume: Full Thickness Without Exposed Full Thickness Without Exposed Full Thickness Without Exposed Classification: Support Structures Support Structures Support Structures Medium None Present Medium Exudate A mount: Serosanguineous [name] Serosanguineous Exudate Type: red, brown [name] red, brown Exudate Color: Distinct, outline attached Distinct, outline attached Distinct, outline attached Wound Margin: Medium (34-66%) None Present (0%) Large (67-100%) Granulation A mount: Red, Hyper-granulation [name] Red Granulation Quality: Medium (34-66%) None Present (0%) Small (1-33%) Necrotic A mount: Fat Layer (Subcutaneous Tissue): Yes Fascia: No Fat Layer (Subcutaneous Tissue): Yes Exposed Structures: Fascia: No Fat Layer (Subcutaneous Tissue): No Fascia: No Tendon: No Tendon: No Tendon: No Muscle: No Muscle:  No Muscle: No Joint: No Joint: No Joint: No Bone: No Bone: No Bone: No Small (1-33%) Large (67-100%) None Epithelialization: Debridement - Selective/Open Wound [name] Debridement - Selective/Open Wound Debridement: Pre-procedure Verification/Time Out 14:51 [name] 14:51 Taken: Lidocaine 4% Topical Solution [name] Lidocaine 4% Topical Solution Pain Control: Slough [name] Slough Tissue Debrided: Non-Viable Tissue [name] Non-Viable Tissue Level: 5.18 [name] 3.14 Debridement A (sq cm): rea Curette [name] Curette Instrument: Minimum [name] Minimum Bleeding: Pressure [name] Pressure Hemostasis A chieved: Procedure was tolerated well [name] Procedure was tolerated well Debridement [name] Response: 3.3x2x0.1 [name] 2x2x0.1 Post Debridement Measurements L x W x D (cm) 0.518 [name] 0.314 Post Debridement Volume: (cm) No Abnormalities Noted No Abnormalities Noted No Abnormalities Noted Periwound Skin Texture: No Abnormalities Noted No Abnormalities Noted No Abnormalities Noted Periwound Skin Moisture: No Abnormalities Noted Hemosiderin Staining: Yes Hemosiderin Staining: Yes Periwound Skin Color: No Abnormality No Abnormality No Abnormality Temperature: Yes [name] [name] Tenderness on Palpation: Debridement [name] Debridement Procedures Performed: Wound Number: [id number]  Photos: Left T Third oe Left T Fourth oe Left, Distal, Medial Lower Leg Wound Location: Gradually Appeared Gradually Appeared Gradually Appeared Wounding Event: Lymphedema Lymphedema Venous Leg Ulcer Primary Etiology: Cataracts, Arrhythmia, Hypertension, Cataracts, Arrhythmia, Hypertension, Cataracts, Arrhythmia, Hypertension, Comorbid History: Peripheral Venous Disease, Gout, Peripheral Venous Disease, Gout, Peripheral Venous Disease, Gout, Osteoarthritis Osteoarthritis Osteoarthritis 01/06/2023 01/06/2023 01/13/2023 Date Acquired: 1 1 0 [name] of [name]: Open Open Open Wound Status: No No No Wound Recurrence: 1x0.5x0.1 0.5x0.3x0.1  1.3x1.4x0.1 Measurements L x W x D (cm) 0.393 0.118 1.429 A (cm) : rea 0.039 0.012 0.143 Volume (cm) : -453.50% -151.10% [name] % Reduction in Area: -457.10% -140.00% [name] % Reduction in Volume: Full Thickness Without Exposed Full Thickness Without Exposed Full Thickness Without Exposed Classification: Support Structures Support Structures Support Structures Medium Medium Medium Exudate Amount: Serous Serous Serous Exudate Type: Psychologist, forensic Exudate Color: Distinct, outline attached Distinct, outline attached Distinct, outline attached Wound Margin: Large (67-100%) Large (67-100%) Large (67-100%) Granulation Amount: BILLIEJO, HENRICH ([id number]) [id number] Page 4 of 19 Red Red Red Granulation Quality: Small (1-33%) Small (1-33%) Small (1-33%) Necrotic Amount: Fat Layer (Subcutaneous Tissue): Yes Fat Layer (Subcutaneous Tissue): Yes Fat Layer (Subcutaneous Tissue): Yes Exposed Structures: Fascia: No Fascia: No Fascia: No Tendon: No Tendon: No Tendon: No Muscle: No Muscle: No Muscle: No Joint: No Joint: No Joint: No Bone: No Bone: No Bone: No None Small (1-33%) None Epithelialization: Debridement - Selective/Open Wound Debridement - Selective/Open Wound Debridement - Selective/Open Wound Debridement: Pre-procedure Verification/Time Out 14:51 14:51 14:51 Taken: Lidocaine 4% Topical Solution Lidocaine 4% Topical Solution Lidocaine 4%  Topical Solution Pain Control: [facility] Tissue Debrided: Non-Viable Tissue Non-Viable Tissue Non-Viable Tissue Level: 0.39 0.12 1.43 Debridement A (sq cm): rea Curette Curette Curette Instrument: Minimum Minimum Minimum Bleeding: Pressure Pressure Pressure Hemostasis A chieved: Procedure was tolerated well Procedure was tolerated well Procedure was tolerated well Debridement [name] Response: 1x0.5x0.1 0.5x0.3x0.1 1.3x1.4x0.1 Post Debridement Measurements L x W x D (cm) 0.039  0.012 0.143 Post Debridement Volume: (cm) No Abnormalities Noted No Abnormalities Noted No Abnormalities Noted Periwound Skin Texture: No Abnormalities Noted No Abnormalities Noted No Abnormalities Noted Periwound Skin Moisture: Rubor: Yes Rubor: Yes No Abnormalities Noted Periwound Skin Color: No Abnormality No Abnormality No Abnormality Temperature: Debridement Debridement Debridement Procedures Performed: Wound Number: [id number] [name] Photos: [name] Left T Fifth oe Left, Dorsal Foot [name] Wound Location: Gradually Appeared Gradually Appeared [name] Wounding Event: Abrasion Abrasion [name] Primary Etiology: Cataracts, Arrhythmia, Hypertension, Cataracts, Arrhythmia, Hypertension, [name] Comorbid History: Peripheral Venous Disease, Gout, Peripheral Venous Disease, Gout, Osteoarthritis Osteoarthritis 01/13/2023 01/13/2023 [name] Date Acquired: 0 0 [name] [name] of [name]: Open Open [name] Wound Status: No No [name] Wound Recurrence: 0.4x0.7x0.1 0.4x0.5x0.1 [name] Measurements L x W x D (cm) 0.22 0.157 [name] A (cm) : rea 0.022 0.016 [name] Volume (cm) : [name] [name] [name] % Reduction in A rea: [name] [name] [name] % Reduction in Volume: Full Thickness Without Exposed Full Thickness Without Exposed [name] Classification: Support Structures Support Structures Medium Medium [name] Exudate A mount: Serous Serous [name] Exudate Type: Media planner [name] Exudate Color: Distinct, outline attached Distinct, outline attached [name] Wound Margin: Large (67-100%) Medium (34-66%) [name] Granulation A mount: Red Red [name] Granulation Quality: Small (1-33%) Medium (34-66%) [name] Necrotic A mount: Fat Layer (Subcutaneous Tissue): Yes Fat Layer (Subcutaneous Tissue): Yes [name] Exposed Structures: Fascia: No Fascia: No Tendon: No Tendon: No Muscle: No Muscle: No Joint: No Joint: No Bone: No Bone: No None None [name] Epithelialization: Debridement - Selective/Open Wound Debridement - Selective/Open Wound [name] Debridement: Pre-procedure  Verification/Time Out 14:51 14:51 [name] Taken: Lidocaine 4% Topical Solution Lidocaine 4% Topical Solution [name] Pain Control: [employer] [name] Tissue Debrided: Non-Viable Tissue Non-Viable Tissue [name] Level: 0.22 0.16 [name] Debridement A (sq cm): rea Curette Curette [name] Instrument: Minimum Minimum [name] Bleeding: Pressure Pressure [name] Hemostasis A chieved: Procedure was tolerated well Procedure was tolerated well [name] Debridement [name] Response: 0.4x0.7x0.1 0.4x0.5x0.1 [name] Post Debridement Measurements L x W x D (cm) 0.022 0.016 [name] Post Debridement Volume: (cm) No Abnormalities Noted No Abnormalities Noted [name] Periwound Skin TextureSHEREDA, Erin [name] ([id number]) [id number] Page 5 of 19 No Abnormalities Noted No Abnormalities Noted [name] Periwound Skin Moisture: No Abnormalities Noted No Abnormalities Noted [name] Periwound Skin Color: No Abnormality No Abnormality [name] Temperature: Debridement Debridement [name] Procedures Performed: [name] Notes Electronic Signature(s) Signed: 01/13/2023 3:03:20 PM By: Duanne Guess MD FACS Entered By: Duanne Guess on 01/13/2023 15:03:20 -------------------------------------------------------------------------------- Multi-Disciplinary Care Plan Details Patient Name: Date of Service: NO RMA [name], [name]. 01/13/2023 2:15 PM Medical Record Number: [id number] Patient Account Number: [id number] Date of Birth/Sex: Treating RN: [birth year] (82 y.o. Erin [name] Primary Care Raihana Balderrama: Erin [name] Other Clinician: Referring Sonda Coppens: Treating Zurii Hewes/Extender: Erin [name]: 6 Active Inactive Necrotic Tissue Nursing Diagnoses: Impaired tissue integrity related to necrotic/devitalized tissue Knowledge deficit related to management of necrotic/devitalized tissue Goals: Necrotic/devitalized tissue will be minimized in the wound bed Date Initiated:  11/26/2022 Target Resolution Date: 03/06/2023 Goal Status: Active Patient/caregiver will verbalize understanding of reason and process for debridement of necrotic  tissue Date Initiated: 11/26/2022 Target Resolution Date: 03/06/2023 Goal Status: Active Interventions: Assess patient pain level pre-, during and post procedure and prior to discharge Provide education on necrotic tissue and debridement process [name] Activities: Apply topical anesthetic as ordered : 11/26/2022 Notes: Wound/Skin Impairment Nursing Diagnoses: Impaired tissue integrity Knowledge deficit related to ulceration/compromised skin integrity Goals: Patient/caregiver will verbalize understanding of skin care regimen Date Initiated: 11/26/2022 Target Resolution Date: 03/06/2023 Goal Status: Active Interventions: Assess ulceration(s) every visit [name] Activities: Skin care regimen initiated : 11/26/2022 Topical wound management initiated : 11/26/2022 Notes: Erin [name], Erin [name] ([id number]) [id number] Page 6 of 19 Electronic Signature(s) Signed: 01/13/2023 3:45:14 PM By: Erin [name] Entered By: Erin [name] on 01/13/2023 15:14:32 -------------------------------------------------------------------------------- Pain Assessment Details Patient Name: Date of Service: NO RMA [name], [name]. 01/13/2023 2:15 PM Medical Record Number: [id number] Patient Account Number: [id number] Date of Birth/Sex: Treating RN: 1941-06-11 (82 y.o. F) Primary Care Taijon Vink: Erin [name] Other Clinician: Referring Clorene Nerio: Treating Kaytlynn Kochan/Extender: Erin [name], Erin [name] in [name]: 6 Active Problems Location of Pain Severity and Description of Pain Patient Has Paino No Site Locations Pain Management and Medication Current Pain Management: Electronic Signature(s) Signed: 01/13/2023 3:48:39 PM By: Erin [name] Entered By: Erin [name] on 01/13/2023  14:18:59 -------------------------------------------------------------------------------- Patient/Caregiver Education Details Patient Name: Date of Service: NO RMA [name], [name]. 7/9/2024andnbsp2:15 PM Medical Record Number: [id number] Patient Account Number: [id number] Date of Birth/Gender: Treating RN: 28-Aug-1940 (82 y.o. Erin [name] Primary Care Physician: Erin [name] Other Clinician: Referring Physician: Treating Physician/Extender: [name] in [name]: 6 Education Assessment Education Provided To: Patient SHENETA, RUAN ([id number]) [id number] Page 7 of 19 Education Topics Provided Venous: Methods: Explain/Verbal Responses: Reinforcements needed, State content correctly Electronic Signature(s) Signed: 01/13/2023 3:45:14 PM By: Erin [name] Entered By: Erin [name] on 01/13/2023 15:14:45 -------------------------------------------------------------------------------- Wound Assessment Details Patient Name: Date of Service: NO RMA [name], [name]. 01/13/2023 2:15 PM Medical Record Number: [id number] Patient Account Number: [id number] Date of Birth/Sex: Treating RN: [birth year] (82 y.o. Erin [name] Primary Care Lorien Shingler: Erin [name] Other Clinician: Referring Lynee Rosenbach: Treating Edee Nifong/Extender: Erin [name], Erin [name] in [name]: 6 Wound Status Wound Number: 1 Primary Venous Leg Ulcer Etiology: Wound Location: Left, Medial Lower Leg Wound Open Wounding Event: Gradually Appeared Status: Date Acquired: 08/29/2022 Comorbid Cataracts, Arrhythmia, Hypertension, Peripheral Venous [name] Of [name]: 6 History: Disease, Gout, Osteoarthritis Clustered Wound: No Photos Wound Measurements Length: (cm) 3.3 Width: (cm) 2 Depth: (cm) 0.1 Area: (cm) 5.184 Volume: (cm) 0.518 % Reduction in Area: 11.8% % Reduction in Volume: 11.8% Epithelialization: Small  (1-33%) Tunneling: No Undermining: No Wound Description Classification: Full Thickness Without Exposed Suppor Wound Margin: Distinct, outline attached Exudate Amount: Medium Exudate Type: Serosanguineous Exudate Color: red, brown t Structures Foul Odor After Cleansing: No Slough/Fibrino Yes Wound Bed Granulation Amount: Medium (34-66%) Exposed Structure Granulation Quality: Red, Hyper-granulation Fascia Exposed: No Necrotic Amount: Medium (34-66%) Fat Layer (Subcutaneous Tissue) Exposed: Yes Necrotic Quality: Adherent Slough Tendon Exposed: No Muscle Exposed: No Joint Exposed: No Bone Exposed: No Erin [name], Erin [name] ([id number]) [id number] Page 8 of 19 Periwound Skin Texture Texture Color No Abnormalities Noted: Yes No Abnormalities Noted: Yes Moisture Temperature / Pain No Abnormalities Noted: Yes Temperature: No Abnormality Tenderness on Palpation: Yes [name] Notes Wound #1 (Lower Leg) Wound Laterality: Left, Medial Cleanser Soap and Water Discharge Instruction: May shower and wash wound with dial antibacterial soap and water prior to dressing change. Wound Cleanser  Discharge Instruction: Cleanse the wound with wound cleanser prior to applying a clean dressing using gauze sponges, not tissue or cotton balls. Peri-Wound Care Sween Lotion (Moisturizing lotion) Discharge Instruction: Apply moisturizing lotion as directed Topical Gentamicin Discharge Instruction: As directed by physician Mupirocin Ointment Discharge Instruction: Apply Mupirocin (Bactroban) as instructed Primary Dressing Maxorb Extra Ag+ Alginate Dressing, 2x2 (in/in) Discharge Instruction: Apply to wound bed as instructed Secondary Dressing ABD Pad, 5x9 Discharge Instruction: Apply over primary dressing as directed. Woven Gauze Sponge, Non-Sterile 4x4 in Discharge Instruction: Apply over primary dressing as directed. Secured With Elastic Bandage 4 inch (ACE  bandage) Discharge Instruction: Secure with ACE bandage as directed. Kerlix Roll Sterile, 4.5x3.1 (in/yd) Discharge Instruction: Secure with Kerlix as directed. Compression Wrap Compression Stockings Add-Ons Electronic Signature(s) Signed: 01/13/2023 3:45:14 PM By: Erin [name] Entered By: Erin [name] on 01/13/2023 14:36:59 -------------------------------------------------------------------------------- Wound Assessment Details Patient Name: Date of Service: NO RMA [name], [name]. 01/13/2023 2:15 PM Medical Record Number: [id number] Patient Account Number: [id number] Date of Birth/Sex: Treating RN: Dec 31, 1940 (82 y.o. Erin [name] Primary Care Nykolas Bacallao: Erin [name] Other Clinician: Referring Yarissa Reining: Treating Davin Archuletta/Extender: Erin [name], Erin [name] in [name]: 6 Wound Status Wound Number: 3 Primary Venous Leg Ulcer Etiology: Erin [name], Erin [name] ([id number]) [id number] Page 9 of 19 Etiology: Wound Location: Left, Lateral Lower Leg Wound Open Wounding Event: Blister Status: Date Acquired: 12/30/2022 Comorbid Cataracts, Arrhythmia, Hypertension, Peripheral Venous [name] Of [name]: 2 History: Disease, Gout, Osteoarthritis Clustered Wound: No Photos Wound Measurements Length: (cm) Width: (cm) Depth: (cm) Area: (cm) Volume: (cm) 0 % Reduction in Area: 100% 0 % Reduction in Volume: 100% 0 Epithelialization: Large (67-100%) 0 Tunneling: No 0 Undermining: No Wound Description Classification: Full Thickness Without Exposed Support Structures Wound Margin: Distinct, outline attached Exudate Amount: None Present Foul Odor After Cleansing: No Slough/Fibrino No Wound Bed Granulation Amount: None Present (0%) Exposed Structure Necrotic Amount: None Present (0%) Fascia Exposed: No Fat Layer (Subcutaneous Tissue) Exposed: No Tendon Exposed: No Muscle Exposed: No Joint Exposed: No Bone Exposed:  No Periwound Skin Texture Texture Color No Abnormalities Noted: Yes No Abnormalities Noted: No Hemosiderin Staining: Yes Moisture No Abnormalities Noted: Yes Temperature / Pain Temperature: No Abnormality Electronic Signature(s) Signed: 01/13/2023 3:45:14 PM By: Erin [name] Entered By: Erin [name] on 01/13/2023 14:37:27 -------------------------------------------------------------------------------- Wound Assessment Details Patient Name: Date of Service: NO RMA [name], [name]. 01/13/2023 2:15 PM Medical Record Number: [id number] Patient Account Number: [id number] Date of Birth/Sex: Treating RN: [birth year] (82 y.o. Erin [name] Primary Care Yaseen Gilberg: Erin [name] Other Clinician: Referring Melanni Benway: Treating Adit Riddles/Extender: Erin [name], Erin [name] in [name]: 6 Wound Status Wound Number: 4 Primary Venous Leg Ulcer Etiology: Wound Location: Left, Anterior Lower Leg Wound Open Wounding Event: Gradually Erin [name], Erin [name] ([id number]) [id number] Page 10 of 19 Wounding Event: Gradually Appeared Status: Date Acquired: 01/06/2023 Comorbid Cataracts, Arrhythmia, Hypertension, Peripheral Venous [name] Of [name]: 1 History: Disease, Gout, Osteoarthritis Clustered Wound: No Photos Wound Measurements Length: (cm) 2 Width: (cm) 2 Depth: (cm) 0.1 Area: (cm) 3.142 Volume: (cm) 0.314 % Reduction in Area: -376.1% % Reduction in Volume: -375.8% Epithelialization: None Tunneling: No Undermining: No Wound Description Classification: Full Thickness Without Exposed Support Structures Wound Margin: Distinct, outline attached Exudate Amount: Medium Exudate Type: Serosanguineous Exudate Color: red, brown Foul Odor After Cleansing: No Slough/Fibrino Yes Wound Bed Granulation Amount: Large (67-100%) Exposed Structure Granulation Quality: Red Fascia Exposed: No Necrotic Amount: Small (1-33%) Fat Layer  (Subcutaneous Tissue) Exposed:  Yes Necrotic Quality: Adherent Slough Tendon Exposed: No Muscle Exposed: No Joint Exposed: No Bone Exposed: No Periwound Skin Texture Texture Color No Abnormalities Noted: Yes No Abnormalities Noted: No Hemosiderin Staining: Yes Moisture No Abnormalities Noted: Yes Temperature / Pain Temperature: No Abnormality [name] Notes Wound #4 (Lower Leg) Wound Laterality: Left, Anterior Cleanser Soap and Water Discharge Instruction: May shower and wash wound with dial antibacterial soap and water prior to dressing change. Wound Cleanser Discharge Instruction: Cleanse the wound with wound cleanser prior to applying a clean dressing using gauze sponges, not tissue or cotton balls. Peri-Wound Care Sween Lotion (Moisturizing lotion) Discharge Instruction: Apply moisturizing lotion as directed Topical Gentamicin Discharge Instruction: As directed by physician Mupirocin Ointment Discharge Instruction: Apply Mupirocin (Bactroban) as instructed Primary Dressing Maxorb Extra Ag+ Alginate Dressing, 2x2 (in/in) Discharge Instruction: Apply to wound bed as instructed ALAILA, [place] ([id number]) [id number] Page 11 of 19 Secondary Dressing ABD Pad, 5x9 Discharge Instruction: Apply over primary dressing as directed. Woven Gauze Sponge, Non-Sterile 4x4 in Discharge Instruction: Apply over primary dressing as directed. Secured With Elastic Bandage 4 inch (ACE bandage) Discharge Instruction: Secure with ACE bandage as directed. Kerlix Roll Sterile, 4.5x3.1 (in/yd) Discharge Instruction: Secure with Kerlix as directed. Compression Wrap Compression Stockings Add-Ons Electronic Signature(s) Signed: 01/13/2023 3:45:14 PM By: Erin [name] Entered By: Erin [name] on 01/13/2023 14:37:54 -------------------------------------------------------------------------------- Wound Assessment Details Patient Name: Date of Service: NO  RMA [name], [name]. 01/13/2023 2:15 PM Medical Record Number: [id number] Patient Account Number: [id number] Date of Birth/Sex: Treating RN: 04-Dec-1940 (82 y.o. 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, Gout, Osteoarthritis Clustered Wound: No Photos Wound Measurements Length: (cm) 1 Width: (cm) 0.5 Depth: (cm) 0.1 Area: (cm) 0.393 Volume: (cm) 0.039 % Reduction in Area: -453.5% % Reduction in Volume: -457.1% Epithelialization: None Tunneling: No Undermining: No Wound Description Classification: Full Thickness Without Exposed Support Structures Wound Margin: Distinct, outline attached Exudate Amount: Medium Exudate Type: Serous Exudate Color: amber Erin [name], Erin [name] ([id number]) Foul Odor After Cleansing: No Slough/Fibrino Yes [id number] Page 12 of 19 Wound Bed Granulation Amount: Large (67-100%) Exposed Structure Granulation Quality: Red Fascia Exposed: No Necrotic Amount: Small (1-33%) Fat Layer (Subcutaneous Tissue) Exposed: Yes Tendon Exposed: No Muscle Exposed: No Joint Exposed: No Bone Exposed: No Periwound Skin Texture Texture Color No Abnormalities Noted: Yes No Abnormalities Noted: No Rubor: Yes Moisture No Abnormalities Noted: Yes Temperature / Pain Temperature: No Abnormality [name] Notes Wound #5 (Toe Third) Wound Laterality: Left Cleanser Soap and Water Discharge Instruction: May shower and wash wound with dial antibacterial soap and water prior to dressing change. Wound Cleanser Discharge Instruction:  Cleanse the wound with wound cleanser prior to applying a clean dressing using gauze sponges, not tissue or cotton balls. Peri-Wound Care Sween Lotion (Moisturizing lotion) Discharge Instruction: Apply moisturizing lotion as directed Topical Gentamicin Discharge Instruction: As directed by physician Mupirocin Ointment Discharge Instruction: Apply Mupirocin (Bactroban) as instructed Primary Dressing Maxorb Extra Ag+ Alginate Dressing, 2x2 (in/in) Discharge Instruction: Apply to wound bed as instructed Secondary Dressing Woven Gauze Sponge, Non-Sterile 4x4 in Discharge Instruction: Apply over primary dressing as directed. Secured With 56M Medipore H Soft Cloth Surgical T ape, 4 x 10 (in/yd) Discharge  Instruction: Secure with tape as directed. Compression Wrap Compression Stockings Add-Ons Electronic Signature(s) Signed: 01/13/2023 3:45:14 PM By: Erin [name] Entered By: Erin [name] on 01/13/2023 14:39:23 -------------------------------------------------------------------------------- Wound Assessment Details Patient Name: Date of Service: NO RMA [name], [name]. 01/13/2023 2:15 PM Medical Record Number: [id number] Patient Account Number: [id number] Date of Birth/Sex: Treating RN: 1941-03-19 (82 y.o. 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, Gout, Osteoarthritis Clustered Wound: No Photos Wound Measurements Length: (cm) 0.5 Width: (cm) 0.3 Depth: (cm) 0.1 Area: (cm)  0.118 Volume: (cm) 0.012 % Reduction in Area: -151.1% % Reduction in Volume: -140% Epithelialization: Small (1-33%) Tunneling: No Undermining: No Wound Description Classification: Full Thickness Without Exposed Support Structures Wound Margin: Distinct, outline attached Exudate Amount: Medium Exudate Type: Serous Exudate Color: amber Foul Odor After Cleansing: No Slough/Fibrino Yes Wound Bed Granulation Amount: Large (67-100%) Exposed Structure Granulation Quality: Red Fascia Exposed: No Necrotic Amount: Small (1-33%) Fat Layer (Subcutaneous Tissue) Exposed: Yes Necrotic Quality: Adherent Slough Tendon Exposed: No Muscle Exposed: No Joint Exposed: No Bone Exposed: No Periwound Skin Texture Texture Color No Abnormalities Noted: Yes No Abnormalities Noted: No Rubor: Yes Moisture No Abnormalities Noted: Yes Temperature / Pain Temperature: No Abnormality [name] Notes Wound #6 (Toe Fourth) Wound Laterality: Left Cleanser Soap and Water Discharge Instruction: May shower and wash wound with dial antibacterial soap and water prior to dressing change. Wound Cleanser Discharge Instruction: Cleanse the wound with wound cleanser prior to applying a clean dressing using gauze sponges, not tissue or cotton balls. Peri-Wound Care Sween Lotion (Moisturizing lotion) Discharge Instruction: Apply moisturizing lotion as directed Topical Gentamicin Discharge Instruction: As directed by physician Mupirocin [address] Erin [name], Erin [name] ([id number]) [id number] Page 14 of 19 Discharge Instruction: Apply Mupirocin (Bactroban) as instructed Primary Dressing Maxorb Extra Ag+ Alginate Dressing, 2x2 (in/in) Discharge Instruction: Apply to wound bed as instructed Secondary Dressing Woven Gauze Sponge, Non-Sterile 4x4 in Discharge Instruction: Apply over primary dressing as directed. Secured With 43M Medipore H Soft Cloth Surgical T ape, 4 x 10 (in/yd) Discharge  Instruction: Secure with tape as directed. Compression Wrap Compression Stockings Add-Ons Electronic Signature(s) Signed: 01/13/2023 3:45:14 PM By: Erin [name] Entered By: Erin [name] on 01/13/2023 14:39:06 -------------------------------------------------------------------------------- Wound Assessment Details Patient Name: Date of Service: NO RMA [name], [name]. 01/13/2023 2:15 PM Medical Record Number: [id number] Patient Account Number: [id number] Date of Birth/Sex: Treating RN: [birth year] (82 y.o. 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, Gout, Osteoarthritis Clustered Wound: No Photos Wound Measurements Length: (cm) 1.3 Width: (cm) 1.4 Depth: (cm) 0.1 Area: (cm) 1.429 Volume: (cm) 0.143 % Reduction in Area: % Reduction in Volume: Epithelialization: None Tunneling: No Undermining: No Wound Description Classification: Full Thickness Without Exposed Support Structures Wound Margin: Distinct, outline attached Exudate Amount: Medium Erin [name], Erin [name] ([id number]) Exudate Type: Serous Exudate Color: amber Foul  Odor After Cleansing: No Slough/Fibrino Yes [id number] Page 15 of 19 Wound Bed Granulation Amount: Large (67-100%) Exposed Structure Granulation Quality: Red Fascia Exposed: No Necrotic Amount: Small (1-33%) Fat Layer (Subcutaneous Tissue) Exposed: Yes Necrotic Quality: Adherent Slough Tendon Exposed: No Muscle Exposed: No Joint Exposed: No Bone Exposed: No Periwound Skin  Texture Texture Color No Abnormalities Noted: Yes No Abnormalities Noted: Yes Moisture Temperature / Pain No Abnormalities Noted: Yes Temperature: No Abnormality [name] Notes Wound #7 (Lower Leg) Wound Laterality: Left, Medial, Distal Cleanser Soap and Water Discharge Instruction: May shower and wash wound with dial antibacterial soap and water prior to dressing change. Wound Cleanser Discharge Instruction: Cleanse the wound with wound cleanser prior to applying a clean dressing using gauze sponges, not tissue or cotton balls. Peri-Wound Care Sween Lotion (Moisturizing lotion) Discharge Instruction: Apply moisturizing lotion as directed Topical Gentamicin Discharge Instruction: As directed by physician Mupirocin Ointment Discharge Instruction: Apply Mupirocin (Bactroban) as instructed Primary Dressing Maxorb Extra Ag+ Alginate Dressing, 2x2 (in/in) Discharge Instruction: Apply to wound bed as instructed Secondary Dressing ABD Pad, 5x9 Discharge Instruction: Apply over primary dressing as directed. Woven Gauze Sponge, Non-Sterile 4x4 in Discharge Instruction: Apply over primary dressing as directed. Secured With Elastic Bandage 4 inch (ACE bandage) Discharge Instruction: Secure with ACE bandage as directed. Kerlix Roll Sterile, 4.5x3.1 (in/yd) Discharge Instruction: Secure with Kerlix as directed. Compression Wrap Compression Stockings Add-Ons Electronic Signature(s) Signed: 01/13/2023 3:45:14 PM By: Erin [name] Entered By: Erin [name] on 01/13/2023 14:39:52 Erin [name] ([id number]) [id number] Page 16 of 19 -------------------------------------------------------------------------------- Wound Assessment Details Patient Name: Date of Service: NO RMA [name], [name]. 01/13/2023 2:15 PM Medical Record Number: [id number] Patient Account Number: [id number] Date of Birth/Sex: Treating RN: 1940-08-12 (82 y.o. 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, Gout, Osteoarthritis Clustered Wound: No Photos Wound Measurements Length: (cm) 0.4 Width: (cm) 0.7 Depth: (cm) 0.1 Area: (cm) 0.22 Volume: (cm) 0.022 % Reduction in Area: % Reduction in Volume: Epithelialization: None Tunneling: No Undermining: No Wound Description Classification: Full Thickness Without Exposed Support Structures Wound Margin: Distinct, outline attached Exudate Amount: Medium Exudate Type: Serous Exudate Color: amber Foul Odor After Cleansing: No Slough/Fibrino Yes Wound Bed Granulation Amount: Large (67-100%) Exposed Structure Granulation Quality: Red Fascia Exposed: No Necrotic Amount: Small (1-33%) Fat Layer (Subcutaneous Tissue) Exposed: Yes Tendon Exposed: No Muscle Exposed: No Joint Exposed: No Bone Exposed: No Periwound Skin Texture Texture Color No Abnormalities Noted: Yes No Abnormalities Noted: Yes Moisture Temperature / Pain No Abnormalities Noted: Yes Temperature: No Abnormality [name] Notes Wound #8 (Toe Fifth) Wound Laterality: Left Cleanser Soap and Water Discharge Instruction: May shower and wash wound with dial antibacterial soap and water prior to dressing change. Wound Cleanser Discharge Instruction: Cleanse the wound with wound cleanser prior to applying a clean dressing using gauze sponges, not tissue or cotton balls. Peri-Wound Care Sween Lotion (Moisturizing lotion) Erin [name], Erin [name] ([id number]) [id number] Page 17 of 19 Discharge Instruction: Apply  moisturizing lotion as directed Topical Gentamicin Discharge Instruction: As directed by physician Mupirocin Ointment Discharge Instruction: Apply Mupirocin (Bactroban) as instructed Primary Dressing Maxorb Extra Ag+ Alginate Dressing, 2x2 (in/in) Discharge Instruction: Apply to wound bed as instructed Secondary Dressing  Woven Gauze Sponge, Non-Sterile 4x4 in Discharge Instruction: Apply over primary dressing as directed. Secured With 86M Medipore H Soft Cloth Surgical T ape, 4 x 10 (in/yd) Discharge Instruction: Secure with tape as directed. Compression Wrap Compression Stockings Add-Ons Electronic Signature(s) Signed: 01/13/2023 3:45:14 PM By: Erin [name] Entered By: Erin [name] on 01/13/2023 14:40:18 -------------------------------------------------------------------------------- Wound Assessment Details Patient Name: Date of Service: NO RMA [name], [name]. 01/13/2023 2:15 PM Medical Record Number: [id number] Patient Account Number: [id number] Date of Birth/Sex: Treating RN: 22-Aug-1940 (82 y.o. 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, Gout, Osteoarthritis Clustered Wound: No Photos Wound Measurements Length: (cm) 0.4 Width: (cm) 0.5 Depth: (cm) 0.1 Area: (cm) 0.157 Erin [name], Erin [name] ([id number]) Volume: (cm) 0.016 % Reduction in Area: % Reduction in Volume: Epithelialization: None Tunneling: No [id number] Page 18 of 19 Undermining: No Wound Description Classification: Full Thickness Without Exposed Support  Structures Wound Margin: Distinct, outline attached Exudate Amount: Medium Exudate Type: Serous Exudate Color: amber Foul Odor After Cleansing: No Slough/Fibrino Yes Wound Bed Granulation Amount: Medium (34-66%) Exposed Structure Granulation Quality: Red Fascia Exposed: No Necrotic Amount: Medium (34-66%) Fat Layer (Subcutaneous Tissue) Exposed: Yes Necrotic Quality: Adherent Slough Tendon Exposed: No Muscle Exposed: No Joint Exposed: No Bone Exposed: No Periwound Skin Texture Texture Color No Abnormalities Noted: Yes No Abnormalities Noted: Yes Moisture Temperature / Pain No Abnormalities Noted: Yes Temperature: No Abnormality [name] Notes Wound #9 (Foot) Wound Laterality: Dorsal, Left Cleanser Soap and Water Discharge Instruction: May shower and wash wound with dial antibacterial soap and water prior to dressing change. Wound Cleanser Discharge Instruction: Cleanse the wound with wound cleanser prior to applying a clean dressing using gauze sponges, not tissue or cotton balls. Peri-Wound Care Sween Lotion (Moisturizing lotion) Discharge Instruction: Apply moisturizing lotion as directed Topical Gentamicin Discharge Instruction: As directed by physician Mupirocin Ointment Discharge Instruction: Apply Mupirocin (Bactroban) as instructed Primary Dressing Maxorb Extra Ag+ Alginate Dressing, 2x2 (in/in) Discharge Instruction: Apply to wound bed as instructed Secondary Dressing Woven Gauze Sponge, Non-Sterile 4x4 in Discharge Instruction: Apply over primary dressing as directed. Secured With 86M Medipore H Soft Cloth Surgical T ape, 4 x 10 (in/yd) Discharge Instruction: Secure with tape as directed. Compression Wrap Compression Stockings Add-Ons Electronic Signature(s) Signed: 01/13/2023 3:45:14 PM By: Erin [name] Entered By: Erin [name] on 01/13/2023 14:40:36 Erin [name] ([id number]) [id number] Page 19 of  19 -------------------------------------------------------------------------------- Vitals Details Patient Name: Date of Service: NO RMA [name], [name]. 01/13/2023 2:15 PM Medical Record Number: [id number] Patient Account Number: [id number] Date of Birth/Sex: Treating RN: 1940/10/06 (82 y.o. F) Primary Care Demetra Moya: Erin [name] Other Clinician: Referring Tayquan Gassman: Treating Joal Eakle/Extender: Erin [name], Erin [name] in [name]: 6 Vital Signs Time Taken: 02:18 Temperature (F): 98.1 Height (in): 60 Pulse (bpm): 64 Weight (lbs): 138 Respiratory Rate (breaths/min): 18 Body Mass Index (BMI): 26.9 Blood Pressure (mmHg): 204/73 Reference Range: 80 - 120 mg / dl Electronic Signature(s) Signed: 01/13/2023 3:48:39 PM By: Erin [name] Entered By: Erin [name] on 01/13/2023 14:18:50

## 2023-01-13 NOTE — Progress Notes (Signed)
Erin [name], Erin [name] ([id number]) [id number] Page 1 of 18 Visit Report for 01/13/2023 Chief Complaint Document Details Patient Name: Date of Service: [name], [name] RBA RA N. 01/13/2023 2:15 PM Medical Record Number: [id number] Patient Account Number: [id number] Date of Birth/Sex: Treating RN: 1941/01/15 (82 y.o. F) Primary Care Provider: [name] Other Clinician: Referring Provider: Treating Provider/Extender: [name] Weeks in Treatment: 6 Information Obtained from: Patient Chief Complaint Patient presents for treatment of an open ulcer due to venous insufficiency Electronic Signature(s) Signed: 01/13/2023 3:03:36 PM By: Duanne Guess MD FACS Entered By: Duanne Guess on 01/13/2023 15:03:36 -------------------------------------------------------------------------------- Debridement Details Patient Name: Date of Service: [name], [name] 01/13/2023 2:15 PM Medical Record Number: [id number] Patient Account Number: [id number] Date of Birth/Sex: Treating RN: 1941/06/05 (82 y.o. Erin [name] Primary Care Provider: [name] Other Clinician: Referring Provider: Treating Provider/Extender: [name] Weeks in Treatment: 6 Debridement Performed for Assessment: Wound #4 Left,Anterior Lower Leg Performed By: Physician Duanne Guess, MD Debridement Type: Debridement Severity of Tissue Pre Debridement: Fat layer exposed Level of Consciousness (Pre-procedure): Awake and Alert Pre-procedure Verification/Time Out Yes - 14:51 Taken: Start Time: 14:51 Pain Control: Lidocaine 4% T opical Solution Percent of Wound Bed Debrided: 100% T Area Debrided (cm): otal 3.14 Tissue and other material debrided: Non-Viable, Slough, Slough Level: Non-Viable Tissue Debridement Description: Selective/Open Wound Instrument: Curette Bleeding: Minimum Hemostasis Achieved: Pressure Response to  Treatment: Procedure was tolerated well Level of Consciousness (Post- Awake and Alert procedure): Post Debridement Measurements of Total Wound Length: (cm) 2 Width: (cm) 2 Depth: (cm) 0.1 Volume: (cm) 0.314 Character of Wound/Ulcer Post Debridement: Improved Severity of Tissue Post Debridement: Fat layer exposed Post Procedure Diagnosis [name], Erin ([id number]) [id number] Page 2 of 18 Same as Pre-procedure Notes scribed for Dr. Lady Gary by Samuella Bruin, RN Electronic Signature(s) Signed: 01/13/2023 3:40:30 PM By: Duanne Guess MD FACS Signed: 01/13/2023 3:45:14 PM By: Samuella Bruin Entered By: Samuella Bruin on 01/13/2023 14:51:55 -------------------------------------------------------------------------------- Debridement Details Patient Name: Date of Service: [name], [name] 01/13/2023 2:15 PM Medical Record Number: [id number] Patient Account Number: [id number] Date of Birth/Sex: Treating RN: 21-Apr-1941 (82 y.o. Erin [name] Primary Care Provider: [name] Other Clinician: Referring Provider: Treating Provider/Extender: [name] Weeks in Treatment: 6 Debridement Performed for Assessment: Wound #5 Left T Third oe Performed By: Physician Duanne Guess, MD Debridement Type: Debridement Level of Consciousness (Pre-procedure): Awake and Alert Pre-procedure Verification/Time Out Yes - 14:51 Taken: Start Time: 14:51 Pain Control: Lidocaine 4% T opical Solution Percent of Wound Bed Debrided: 100% T Area Debrided (cm): otal 0.39 Tissue and other material debrided: Non-Viable, Slough, Slough Level: Non-Viable Tissue Debridement Description: Selective/Open Wound Instrument: Curette Bleeding: Minimum Hemostasis Achieved: Pressure Response to Treatment: Procedure was tolerated well Level of Consciousness (Post- Awake and Alert procedure): Post Debridement Measurements of Total  Wound Length: (cm) 1 Width: (cm) 0.5 Depth: (cm) 0.1 Volume: (cm) 0.039 Character of Wound/Ulcer Post Debridement: Improved Post Procedure Diagnosis Same as Pre-procedure Notes scribed for Dr. Lady Gary by Samuella Bruin, RN Electronic Signature(s) Signed: 01/13/2023 3:40:30 PM By: Duanne Guess MD FACS Signed: 01/13/2023 3:45:14 PM By: Samuella Bruin Entered By: Samuella Bruin on 01/13/2023 14:52:13 Debridement Details -------------------------------------------------------------------------------- [name] ([id number]) [id number] Page 3 of 18 Patient Name: Date of Service: [name], [name] 01/13/2023 2:15 PM Medical Record Number: [id number] Patient Account Number: [id number] Date of Birth/Sex: Treating RN:  1941/01/22 (82 y.o. Erin [name] Primary Care Provider: [name] Other Clinician: Referring Provider: Treating Provider/Extender: [name] Weeks in Treatment: 6 Debridement Performed for Assessment: Wound #6 Left T Fourth oe Performed By: Physician Duanne Guess, MD Debridement Type: Debridement Level of Consciousness (Pre-procedure): Awake and Alert Pre-procedure Verification/Time Out Yes - 14:51 Taken: Start Time: 14:51 Pain Control: Lidocaine 4% T opical Solution Percent of Wound Bed Debrided: 100% T Area Debrided (cm): otal 0.12 Tissue and other material debrided: Non-Viable, Slough, Slough Level: Non-Viable Tissue Debridement Description: Selective/Open Wound Instrument: Curette Bleeding: Minimum Hemostasis Achieved: Pressure Response to Treatment: Procedure was tolerated well Level of Consciousness (Post- Awake and Alert procedure): Post Debridement Measurements of Total Wound Length: (cm) 0.5 Width: (cm) 0.3 Depth: (cm) 0.1 Volume: (cm) 0.012 Character of Wound/Ulcer Post Debridement: Improved Post Procedure Diagnosis Same as Pre-procedure Notes scribed  for Dr. Lady Gary by Samuella Bruin, RN Electronic Signature(s) Signed: 01/13/2023 3:40:30 PM By: Duanne Guess MD FACS Signed: 01/13/2023 3:45:14 PM By: Samuella Bruin Entered By: Samuella Bruin on 01/13/2023 14:52:30 -------------------------------------------------------------------------------- Debridement Details Patient Name: Date of Service: [name], [name] 01/13/2023 2:15 PM Medical Record Number: [id number] Patient Account Number: [id number] Date of Birth/Sex: Treating RN: 1941-02-20 (82 y.o. Erin [name] Primary Care Provider: [name] Other Clinician: Referring Provider: Treating Provider/Extender: [name] Weeks in Treatment: 6 Debridement Performed for Assessment: Wound #8 Left T Fifth oe Performed By: Physician Duanne Guess, MD Debridement Type: Debridement Level of Consciousness (Pre-procedure): Awake and Alert Pre-procedure Verification/Time Out Yes - 14:51 Taken: Start Time: 14:51 Pain Control: Lidocaine 4% T opical Solution Percent of Wound Bed Debrided: 100% T Area Debrided (cm): otal 0.22 Tissue and other material debrided: Non-Viable, Slough, Slough Level: Non-Viable Tissue Debridement Description: Selective/Open Wound Instrument: Curette Bleeding: Minimum Hemostasis Achieved: Pressure SHOLONDA, JOBST ([id number]) [id number] Page 4 of 18 Response to Treatment: Procedure was tolerated well Level of Consciousness (Post- Awake and Alert procedure): Post Debridement Measurements of Total Wound Length: (cm) 0.4 Width: (cm) 0.7 Depth: (cm) 0.1 Volume: (cm) 0.022 Character of Wound/Ulcer Post Debridement: Improved Post Procedure Diagnosis Same as Pre-procedure Notes scribed for Dr. Lady Gary by Samuella Bruin, RN Electronic Signature(s) Signed: 01/13/2023 3:40:30 PM By: Duanne Guess MD FACS Signed: 01/13/2023 3:45:14 PM By: Samuella Bruin Entered By:  Samuella Bruin on 01/13/2023 14:52:48 -------------------------------------------------------------------------------- Debridement Details Patient Name: Date of Service: [name], [name] 01/13/2023 2:15 PM Medical Record Number: [id number] Patient Account Number: [id number] Date of Birth/Sex: Treating RN: [birth year] (82 y.o. Erin [name] Primary Care Provider: [name] Other Clinician: Referring Provider: Treating Provider/Extender: [name] Weeks in Treatment: 6 Debridement Performed for Assessment: Wound #9 Left,Dorsal Foot Performed By: Physician Duanne Guess, MD Debridement Type: Debridement Level of Consciousness (Pre-procedure): Awake and Alert Pre-procedure Verification/Time Out Yes - 14:51 Taken: Start Time: 14:51 Pain Control: Lidocaine 4% T opical Solution Percent of Wound Bed Debrided: 100% T Area Debrided (cm): otal 0.16 Tissue and other material debrided: Non-Viable, Slough, Slough Level: Non-Viable Tissue Debridement Description: Selective/Open Wound Instrument: Curette Bleeding: Minimum Hemostasis Achieved: Pressure Response to Treatment: Procedure was tolerated well Level of Consciousness (Post- Awake and Alert procedure): Post Debridement Measurements of Total Wound Length: (cm) 0.4 Width: (cm) 0.5 Depth: (cm) 0.1 Volume: (cm) 0.016 Character of Wound/Ulcer Post Debridement: Improved Post Procedure Diagnosis Same as Pre-procedure Notes scribed for Dr. Lady Gary by Samuella Bruin, RN Electronic Signature(s) Signed: 01/13/2023 3:40:30  PM By: Duanne Guess MD FACS Signed: 01/13/2023 3:45:14 PM By: Aura Camps: 01/13/2023 3:45:14 PM By: Mosetta Pigeon ([id number]) [id number] Page 5 of 18 aylor Entered By: Samuella Bruin on 01/13/2023 14:53:06 -------------------------------------------------------------------------------- Debridement  Details Patient Name: Date of Service: [name], [name] 01/13/2023 2:15 PM Medical Record Number: [id number] Patient Account Number: [id number] Date of Birth/Sex: Treating RN: 1941/02/23 (82 y.o. Erin [name] Primary Care Provider: [name] Other Clinician: Referring Provider: Treating Provider/Extender: [name] Weeks in Treatment: 6 Debridement Performed for Assessment: Wound #7 Left,Distal,Medial Lower Leg Performed By: Physician Duanne Guess, MD Debridement Type: Debridement Severity of Tissue Pre Debridement: Fat layer exposed Level of Consciousness (Pre-procedure): Awake and Alert Pre-procedure Verification/Time Out Yes - 14:51 Taken: Start Time: 14:51 Pain Control: Lidocaine 4% T opical Solution Percent of Wound Bed Debrided: 100% T Area Debrided (cm): otal 1.43 Tissue and other material debrided: Non-Viable, Slough, Slough Level: Non-Viable Tissue Debridement Description: Selective/Open Wound Instrument: Curette Bleeding: Minimum Hemostasis Achieved: Pressure Response to Treatment: Procedure was tolerated well Level of Consciousness (Post- Awake and Alert procedure): Post Debridement Measurements of Total Wound Length: (cm) 1.3 Width: (cm) 1.4 Depth: (cm) 0.1 Volume: (cm) 0.143 Character of Wound/Ulcer Post Debridement: Improved Severity of Tissue Post Debridement: Fat layer exposed Post Procedure Diagnosis Same as Pre-procedure Notes scribed for Dr. Lady Gary by Samuella Bruin, RN Electronic Signature(s) Signed: 01/13/2023 3:40:30 PM By: Duanne Guess MD FACS Signed: 01/13/2023 3:45:14 PM By: Samuella Bruin Entered By: Samuella Bruin on 01/13/2023 14:53:27 -------------------------------------------------------------------------------- Debridement Details Patient Name: Date of Service: [name], [name] 01/13/2023 2:15 PM Medical Record Number: [id number] Patient Account Number: [id number] Date  of Birth/Sex: Treating RN: 05-18-1941 (82 y.o. Erin [name] Primary Care Provider: [name] Other Clinician: Referring Provider: Treating Provider/Extender: [name], [name] in Treatment: 6 Debridement Performed for Assessment: Wound #1 Left,Medial Lower Leg ERIE, SICA ([id number]) [id number] Page 6 of 18 Performed By: Physician Duanne Guess, MD Debridement Type: Debridement Severity of Tissue Pre Debridement: Fat layer exposed Level of Consciousness (Pre-procedure): Awake and Alert Pre-procedure Verification/Time Out Yes - 14:51 Taken: Start Time: 14:51 Pain Control: Lidocaine 4% T opical Solution Percent of Wound Bed Debrided: 100% T Area Debrided (cm): otal 5.18 Tissue and other material debrided: Non-Viable, Slough, Slough Level: Non-Viable Tissue Debridement Description: Selective/Open Wound Instrument: Curette Bleeding: Minimum Hemostasis Achieved: Pressure Response to Treatment: Procedure was tolerated well Level of Consciousness (Post- Awake and Alert procedure): Post Debridement Measurements of Total Wound Length: (cm) 3.3 Width: (cm) 2 Depth: (cm) 0.1 Volume: (cm) 0.518 Character of Wound/Ulcer Post Debridement: Improved Severity of Tissue Post Debridement: Fat layer exposed Post Procedure Diagnosis Same as Pre-procedure Notes scribed for Dr. Lady Gary by Samuella Bruin, RN Electronic Signature(s) Signed: 01/13/2023 3:40:30 PM By: Duanne Guess MD FACS Signed: 01/13/2023 3:45:14 PM By: Samuella Bruin Entered By: Samuella Bruin on 01/13/2023 14:53:49 -------------------------------------------------------------------------------- HPI Details Patient Name: Date of Service: [name], [name] 01/13/2023 2:15 PM Medical Record Number: [id number] Patient Account Number: [id number] Date of Birth/Sex: Treating RN: [birth year] (82 y.o. F) Primary Care Provider: [name] Other Clinician: Referring Provider: Treating Provider/Extender: [name] Weeks in Treatment: 6 History of Present Illness HPI Description: ADMISSION 11/26/2022 This is an 82 year old woman who was referred by her primary care provider for a persistent nonhealing wound on her left lower extremity. It has been present since February of  this year. 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 I do not see any formal venous reflux studies in the electronic medical record. She is not diabetic. She does not smoke. Her PCP has been washing her leg each week and applying a Kerlix and Coban wrap, but has not performed any formal debridement and has not been using any sort of topical contact layer or ointment. ABI in clinic was noncompressible, but she has a palpable pedal pulse. 12/10/2022: The wound is a bit smaller and cleaner today. There is still fairly heavy slough on the surface. Edema control is acceptable. 12/16/2022: The wound is measuring the slightest bit smaller today. There is still thick slough on the surface. Edema control is good. 12/23/2022: The wound is a little bit smaller again today. Still with fairly heavy slough accumulation. Edema control is good. She has a new small wound on the proximal portion of the same leg. It looks as though she may have developed a small blister over the weekend subsequently ruptured. There is minimal slough on the surface. 12/30/2022: The wound is about the same size, but substantially cleaner. There is some hypertrophic granulation tissue starting to emerge. The wound that was new last week has closed but she has a different new wound today on the lateral aspect of her left lower leg. It also appears to have been a blister that opened and ruptured. There is a little bit of slough on the surface. DIESHA, ROSTAD ([id number])  [id number] Page 7 of 18 01/06/2023: She has new wounds today. She has a wound on the PIP knuckle of her left third and fourth toes, as well as an anterior tibial wound. She says that the wrap was too tight and it also looks like when she pushes her foot into her shoe, it shifts the wrapping back to her midfoot causing bunching up of the wrap material and pain. The lateral leg wounds are both smaller with a little slough and eschar present. The original medial leg wound is smaller and more superficial. There is slough on the surface. 01/13/2023: She has new wounds today. The anterior tibial wound has a satellite lesion that has opened up adjacent to it. Has additional wounds on her fifth toe in addition to the existing ones on her third and fourth toe. The lateral leg wounds seem to have healed. The original medial leg wound is also smaller, but she and her daughter have several questions regarding why we are not making forward progress, all of which are very reasonable. Electronic Signature(s) Signed: 01/13/2023 3:12:00 PM By: Duanne Guess MD FACS Entered By: Duanne Guess on 01/13/2023 15:12:00 -------------------------------------------------------------------------------- Physical Exam Details Patient Name: Date of Service: [name], [name] 01/13/2023 2:15 PM Medical Record Number: [id number] Patient Account Number: [id number] Date of Birth/Sex: Treating RN: Jan 09, 1941 (82 y.o. F) Primary Care Provider: [name] Other Clinician: Referring Provider: Treating Provider/Extender: [name], [name] in Treatment: 6 Constitutional Hypertensive, asymptomatic. . . . no acute distress. Respiratory Normal work of breathing on room air. Notes 01/13/2023: She has new wounds today. The anterior tibial wound has a satellite lesion that has opened up adjacent to it. Has additional wounds on her fifth toe in addition to the existing ones  on her third and fourth toe. The lateral leg wounds seem to have healed. The original medial leg wound is also smaller, Electronic Signature(s) Signed:  01/13/2023 3:12:39 PM By: Duanne Guess MD FACS Entered By: Duanne Guess on 01/13/2023 15:12:39 -------------------------------------------------------------------------------- Physician Orders Details Patient Name: Date of Service: [name], [name] 01/13/2023 2:15 PM Medical Record Number: [id number] Patient Account Number: [id number] Date of Birth/Sex: Treating RN: 02/23/1941 (82 y.o. Erin [name] Primary Care Provider: [name] Other Clinician: Referring Provider: Treating Provider/Extender: [name] Weeks in Treatment: 6 Verbal / Phone Orders: No Diagnosis Coding ICD-10 Coding Code Description [phone] Non-pressure chronic ulcer of other part of left lower leg with fat layer exposed L97.522 Non-pressure chronic ulcer of other part of left foot with fat layer exposed I87.332 Chronic venous hypertension (idiopathic) with ulcer and inflammation of left lower extremity I10 Essential (primary) hypertension Follow-up Appointments ppointment in 1 week. - Dr. Lady Gary - room 2 Return A ASYIA, HORNUNG ([id number]) [id number] Page 8 of 18 Anesthetic (In clinic) Topical Lidocaine 4% applied to wound bed Bathing/ Shower/ Hygiene May shower with protection but do not get wound dressing(s) wet. Protect dressing(s) with water repellant cover (for example, large plastic bag) or a cast cover and may then take shower. Edema Control - Lymphedema / SCD / Other Elevate legs to the level of the heart or above for 30 minutes daily and/or when sitting for 3-4 times a day throughout the day. Avoid standing for long periods of time. Wound Treatment Wound #1 - Lower Leg Wound Laterality: Left, Medial Cleanser: Soap and Water 1 x Per Week/30 Days Discharge Instructions:  May shower and wash wound with dial antibacterial soap and water prior to dressing change. Cleanser: Wound Cleanser 1 x Per Week/30 Days Discharge Instructions: Cleanse the wound with wound cleanser prior to applying a clean dressing using gauze sponges, not tissue or cotton balls. Peri-Wound Care: Sween Lotion (Moisturizing lotion) 1 x Per Week/30 Days Discharge Instructions: Apply moisturizing lotion as directed Topical: Gentamicin 1 x Per Week/30 Days Discharge Instructions: As directed by physician Topical: Mupirocin Ointment 1 x Per Week/30 Days Discharge Instructions: Apply Mupirocin (Bactroban) as instructed Prim Dressing: Maxorb Extra Ag+ Alginate Dressing, 2x2 (in/in) 1 x Per Week/30 Days ary Discharge Instructions: Apply to wound bed as instructed Secondary Dressing: ABD Pad, 5x9 1 x Per Week/30 Days Discharge Instructions: Apply over primary dressing as directed. Secondary Dressing: Woven Gauze Sponge, Non-Sterile 4x4 in 1 x Per Week/30 Days Discharge Instructions: Apply over primary dressing as directed. Secured With: Elastic Bandage 4 inch (ACE bandage) 1 x Per Week/30 Days Discharge Instructions: Secure with ACE bandage as directed. Secured With: [employer], 4.5x3.1 (in/yd) 1 x Per Week/30 Days Discharge Instructions: Secure with Kerlix as directed. Wound #4 - Lower Leg Wound Laterality: Left, Anterior Cleanser: Soap and Water 1 x Per Week/30 Days Discharge Instructions: May shower and wash wound with dial antibacterial soap and water prior to dressing change. Cleanser: Wound Cleanser 1 x Per Week/30 Days Discharge Instructions: Cleanse the wound with wound cleanser prior to applying a clean dressing using gauze sponges, not tissue or cotton balls. Peri-Wound Care: Sween Lotion (Moisturizing lotion) 1 x Per Week/30 Days Discharge Instructions: Apply moisturizing lotion as directed Topical: Gentamicin 1 x Per Week/30 Days Discharge Instructions: As directed by  physician Topical: Mupirocin Ointment 1 x Per Week/30 Days Discharge Instructions: Apply Mupirocin (Bactroban) as instructed Prim Dressing: Maxorb Extra Ag+ Alginate Dressing, 2x2 (in/in) 1 x Per Week/30 Days ary Discharge Instructions: Apply to wound bed as instructed Secondary Dressing: ABD Pad, 5x9 1 x Per Week/30 Days Discharge  Instructions: Apply over primary dressing as directed. Secondary Dressing: Woven Gauze Sponge, Non-Sterile 4x4 in 1 x Per Week/30 Days Discharge Instructions: Apply over primary dressing as directed. Secured With: Elastic Bandage 4 inch (ACE bandage) 1 x Per Week/30 Days Discharge Instructions: Secure with ACE bandage as directed. Secured With: [employer], 4.5x3.1 (in/yd) 1 x Per Week/30 Days Discharge Instructions: Secure with Kerlix as directed. Wound #5 - T Third oe Wound Laterality: Left Cleanser: Soap and Water Every Other Day/30 Days KATHALINA, OSTERMANN ([id number]) [id number] Page 9 of 18 Discharge Instructions: May shower and wash wound with dial antibacterial soap and water prior to dressing change. Cleanser: Wound Cleanser Every Other Day/30 Days Discharge Instructions: Cleanse the wound with wound cleanser prior to applying a clean dressing using gauze sponges, not tissue or cotton balls. Peri-Wound Care: Sween Lotion (Moisturizing lotion) Every Other Day/30 Days Discharge Instructions: Apply moisturizing lotion as directed Topical: Gentamicin Every Other Day/30 Days Discharge Instructions: As directed by physician Topical: Mupirocin Ointment Every Other Day/30 Days Discharge Instructions: Apply Mupirocin (Bactroban) as instructed Prim Dressing: Maxorb Extra Ag+ Alginate Dressing, 2x2 (in/in) (DME) (Generic) Every Other Day/30 Days ary Discharge Instructions: Apply to wound bed as instructed Secondary Dressing: Woven Gauze Sponge, Non-Sterile 4x4 in (DME) (Generic) Every Other Day/30 Days Discharge Instructions:  Apply over primary dressing as directed. Secured With: 632M Medipore H Soft Cloth Surgical T ape, 4 x 10 (in/yd) (DME) (Generic) Every Other Day/30 Days Discharge Instructions: Secure with tape as directed. Wound #6 - T Fourth oe Wound Laterality: Left Cleanser: Soap and Water Every Other Day/30 Days Discharge Instructions: May shower and wash wound with dial antibacterial soap and water prior to dressing change. Cleanser: Wound Cleanser Every Other Day/30 Days Discharge Instructions: Cleanse the wound with wound cleanser prior to applying a clean dressing using gauze sponges, not tissue or cotton balls. Peri-Wound Care: Sween Lotion (Moisturizing lotion) Every Other Day/30 Days Discharge Instructions: Apply moisturizing lotion as directed Topical: Gentamicin Every Other Day/30 Days Discharge Instructions: As directed by physician Topical: Mupirocin Ointment Every Other Day/30 Days Discharge Instructions: Apply Mupirocin (Bactroban) as instructed Prim Dressing: Maxorb Extra Ag+ Alginate Dressing, 2x2 (in/in) Every Other Day/30 Days ary Discharge Instructions: Apply to wound bed as instructed Secondary Dressing: Woven Gauze Sponge, Non-Sterile 4x4 in Every Other Day/30 Days Discharge Instructions: Apply over primary dressing as directed. Secured With: 632M Medipore H Soft Cloth Surgical T ape, 4 x 10 (in/yd) Every Other Day/30 Days Discharge Instructions: Secure with tape as directed. Wound #7 - Lower Leg Wound Laterality: Left, Medial, Distal Cleanser: Soap and Water 1 x Per Week/30 Days Discharge Instructions: May shower and wash wound with dial antibacterial soap and water prior to dressing change. Cleanser: Wound Cleanser 1 x Per Week/30 Days Discharge Instructions: Cleanse the wound with wound cleanser prior to applying a clean dressing using gauze sponges, not tissue or cotton balls. Peri-Wound Care: Sween Lotion (Moisturizing lotion) 1 x Per Week/30 Days Discharge Instructions: Apply  moisturizing lotion as directed Topical: Gentamicin 1 x Per Week/30 Days Discharge Instructions: As directed by physician Topical: Mupirocin Ointment 1 x Per Week/30 Days Discharge Instructions: Apply Mupirocin (Bactroban) as instructed Prim Dressing: Maxorb Extra Ag+ Alginate Dressing, 2x2 (in/in) 1 x Per Week/30 Days ary Discharge Instructions: Apply to wound bed as instructed Secondary Dressing: ABD Pad, 5x9 1 x Per Week/30 Days Discharge Instructions: Apply over primary dressing as directed. Secondary Dressing: Woven Gauze Sponge, Non-Sterile 4x4 in 1 x Per Week/30 Days Discharge Instructions: Apply  over primary dressing as directed. Secured With: Elastic Bandage 4 inch (ACE bandage) 1 x Per Week/30 Days Discharge Instructions: Secure with ACE bandage as directed. Secured With: [employer], 4.5x3.1 (in/yd) 1 x Per Week/30 Days CATALEAH, STITES ([id number]) [id number] Page 10 of 18 Discharge Instructions: Secure with Kerlix as directed. Wound #8 - T Fifth oe Wound Laterality: Left Cleanser: Soap and Water Every Other Day/30 Days Discharge Instructions: May shower and wash wound with dial antibacterial soap and water prior to dressing change. Cleanser: Wound Cleanser Every Other Day/30 Days Discharge Instructions: Cleanse the wound with wound cleanser prior to applying a clean dressing using gauze sponges, not tissue or cotton balls. Peri-Wound Care: Sween Lotion (Moisturizing lotion) Every Other Day/30 Days Discharge Instructions: Apply moisturizing lotion as directed Topical: Gentamicin Every Other Day/30 Days Discharge Instructions: As directed by physician Topical: Mupirocin Ointment Every Other Day/30 Days Discharge Instructions: Apply Mupirocin (Bactroban) as instructed Prim Dressing: Maxorb Extra Ag+ Alginate Dressing, 2x2 (in/in) Every Other Day/30 Days ary Discharge Instructions: Apply to wound bed as instructed Secondary Dressing: Woven  Gauze Sponge, Non-Sterile 4x4 in Every Other Day/30 Days Discharge Instructions: Apply over primary dressing as directed. Secured With: 236M Medipore H Soft Cloth Surgical T ape, 4 x 10 (in/yd) Every Other Day/30 Days Discharge Instructions: Secure with tape as directed. Wound #9 - Foot Wound Laterality: Dorsal, Left Cleanser: Soap and Water Every Other Day/30 Days Discharge Instructions: May shower and wash wound with dial antibacterial soap and water prior to dressing change. Cleanser: Wound Cleanser Every Other Day/30 Days Discharge Instructions: Cleanse the wound with wound cleanser prior to applying a clean dressing using gauze sponges, not tissue or cotton balls. Cleanser: [employer] Ancillary Kit - 15 Day Supply (DME) (Generic) Every Other Day/30 Days Discharge Instructions: Use supplies as instructed; Kit contains: (15) Saline Bullets; (15) 3x3 Gauze; 15 pr Gloves Peri-Wound Care: Sween Lotion (Moisturizing lotion) Every Other Day/30 Days Discharge Instructions: Apply moisturizing lotion as directed Topical: Gentamicin Every Other Day/30 Days Discharge Instructions: As directed by physician Topical: Mupirocin Ointment Every Other Day/30 Days Discharge Instructions: Apply Mupirocin (Bactroban) as instructed Prim Dressing: Maxorb Extra Ag+ Alginate Dressing, 2x2 (in/in) (DME) (Generic) Every Other Day/30 Days ary Discharge Instructions: Apply to wound bed as instructed Secondary Dressing: Woven Gauze Sponge, Non-Sterile 4x4 in (DME) (Generic) Every Other Day/30 Days Discharge Instructions: Apply over primary dressing as directed. Secured With: 236M Medipore H Soft Cloth Surgical T ape, 4 x 10 (in/yd) (DME) (Generic) Every Other Day/30 Days Discharge Instructions: Secure with tape as directed. Consults Vascular - referral for formal venous and arterial studies Patient Medications llergies: codeine, morphine A Notifications Medication Indication Start End 01/13/2023 lidocaine DOSE topical 4  % cream - cream topical Electronic Signature(s) Unsigned Previous Signature: 01/13/2023 3:40:30 PM Version By: Duanne Guess MD FACS Entered By: Samuella Bruin on 01/13/2023 15:53:01 Signature(s): TAYIA, STONESIFER ([id number]) [phone] Date(s): _[id number] Page 11 of 18 -------------------------------------------------------------------------------- Problem List Details Patient Name: Date of Service: [name], [name] 01/13/2023 2:15 PM Medical Record Number: [id number] Patient Account Number: [id number] Date of Birth/Sex: Treating RN: 10-15-1940 (82 y.o. 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 01/13/2023 2:15 PM Medical Record Number: [id number] Patient Account Number: [id number] Date of Birth/Sex: Treating RN: Sep 01, 1940 (82 y.o. F) Primary Care Provider: [name] Other Clinician: Referring Provider: Treating Provider/Extender: [name] Weeks in Treatment: 6 Subjective Chief Complaint Information obtained from  Patient Patient presents for treatment of an open ulcer due to venous insufficiency History of Present Illness (HPI) ADMISSION 11/26/2022 ANABELA, CRAYTON ([id number]) [id number] Page 12 of 18 This is an 82 year old woman who was referred by her primary care provider for a persistent nonhealing wound on her left lower extremity. It has been present since February of this year. 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 I do not see any formal venous reflux studies in the electronic medical record. She is not diabetic. She does not smoke. Her PCP has been washing her leg each week and applying a Kerlix and Coban wrap, but has not performed any formal debridement and has not been using any sort of topical contact layer or ointment. ABI in clinic was noncompressible, but she has a palpable pedal pulse. 12/10/2022: The wound is a bit smaller and cleaner today. There is still fairly heavy slough on the surface. Edema control is acceptable. 12/16/2022: The wound is measuring the slightest bit smaller today. There is still thick slough on the surface. Edema control is good. 12/23/2022: The wound is a little bit smaller again today. Still with fairly heavy slough accumulation. Edema control is good. She has a new small wound on the proximal portion of the same leg. It looks as though she may have developed a small blister over the weekend subsequently ruptured. There is minimal slough on the surface. 12/30/2022: The wound is about the same size, but substantially cleaner. There is some hypertrophic granulation tissue starting to emerge. The wound that was new last week has closed but she has a different new wound today on the lateral aspect of her left lower leg. It also appears to have been a blister that opened and ruptured. There is a little bit of slough on the surface. 01/06/2023: She has  new wounds today. She has a wound on the PIP knuckle of her left third and fourth toes, as well as an anterior tibial wound. She says that the wrap was too tight and it also looks like when she pushes her foot into her shoe, it shifts the wrapping back to her midfoot causing bunching up of the wrap material and pain. The lateral leg wounds are both smaller with a little slough and eschar present. The original medial leg wound is smaller and more superficial. There is slough on the surface. 01/13/2023: She has new wounds today. The anterior tibial wound has a satellite lesion that has opened up adjacent to it. Has additional wounds on her fifth toe in addition to the existing ones on her third and fourth toe. The lateral leg wounds seem to have healed. The original medial leg wound is also smaller, but she and her daughter have several questions regarding why we are not  making forward progress, all of which are very reasonable. Patient History Family History Cancer - Siblings, Lung Disease - Siblings, No family history of Diabetes, Heart Disease, Hereditary Spherocytosis, Hypertension, Kidney Disease, Seizures, Stroke, Thyroid Problems, Tuberculosis. Social History Never smoker, Marital Status - Widowed, Alcohol Use - Never, Drug Use - No History, Caffeine Use - Moderate. Medical History Eyes Patient has history of Cataracts Cardiovascular Patient has history of Arrhythmia - bundle branch block, Hypertension, Peripheral Venous Disease Musculoskeletal Patient has history of Gout, Osteoarthritis Hospitalization/Surgery History - Salpingoophorectomy. - Colon resection. - Appendectomy. - Cystoscopy w/ retrogrades. - Joint replacement. - Mastectomy right. - gastric ulcer repair. - Abdominal hysterectomy. Medical A Surgical History Notes nd Hematologic/Lymphatic thrombocytopenia Gastrointestinal diverticulosis, GERD Endocrine hypothyroidism Genitourinary CKD stage III Musculoskeletal DJD,  osteoporosis Oncologic breast cancer Objective Constitutional Hypertensive, asymptomatic. no acute distress. Vitals Time Taken: 2:18 AM, Height: 60 in, Weight: 138 lbs, BMI: 26.9, Temperature: 98.1 F, Pulse: 64 bpm, Respiratory Rate: 18 breaths/min, Blood Pressure: 204/73 mmHg. Respiratory Normal work of breathing on room air. General Notes: 01/13/2023: She has new wounds today. The anterior tibial wound has a satellite lesion that has opened up adjacent to it. Has additional wounds on her fifth toe in addition to the existing ones on her third and fourth toe. The lateral leg wounds seem to have healed. The original medial leg wound is also smaller, Integumentary (Hair, Skin) Wound #1 status is Open. Original cause of wound was Gradually Appeared. The date acquired was: 08/29/2022. The wound has been in treatment 6 weeks. The wound is located on the Left,Medial Lower Leg. The wound measures 3.3cm length x 2cm width x 0.1cm depth; 5.184cm^2 area and 0.518cm^3 volume. There is Fat Layer (Subcutaneous Tissue) exposed. There is no tunneling or undermining noted. There is a medium amount of serosanguineous drainage noted. The KYLENA, MOLE ([id number]) [id number] Page 13 of 18 wound margin is distinct with the outline attached to the wound base. There is medium (34-66%) red, hyper - granulation within the wound bed. There is a medium (34-66%) amount of necrotic tissue within the wound bed including Adherent Slough. The periwound skin appearance had no abnormalities noted for texture. The periwound skin appearance had no abnormalities noted for moisture. The periwound skin appearance had no abnormalities noted for color. Periwound temperature was noted as No Abnormality. The periwound has tenderness on palpation. Wound #3 status is Open. Original cause of wound was Blister. The date acquired was: 12/30/2022. The wound has been in treatment 2 weeks. The wound is located on  the Left,Lateral Lower Leg. The wound measures 0cm length x 0cm width x 0cm depth; 0cm^2 area and 0cm^3 volume. There is no tunneling or undermining noted. There is a none present amount of drainage noted. The wound margin is distinct with the outline attached to the wound base. There is no granulation within the wound bed. There is no necrotic tissue within the wound bed. The periwound skin appearance had no abnormalities noted for texture. The periwound skin appearance had no abnormalities noted for moisture. The periwound skin appearance exhibited: Hemosiderin Staining. Periwound temperature was noted as No Abnormality. Wound #4 status is Open. Original cause of wound was Gradually Appeared. The date acquired was: 01/06/2023. The wound has been in treatment 1 weeks. The wound is located on the Left,Anterior Lower Leg. The wound measures 2cm length x 2cm width x 0.1cm depth; 3.142cm^2 area and 0.314cm^3 volume. There is Fat Layer (Subcutaneous Tissue) exposed. There is no tunneling or undermining noted.  There is a medium amount of serosanguineous drainage noted. The wound margin is distinct with the outline attached to the wound base. There is large (67-100%) red granulation within the wound bed. There is a small (1-33%) amount of necrotic tissue within the wound bed including Adherent Slough. The periwound skin appearance had no abnormalities noted for texture. The periwound skin appearance had no abnormalities noted for moisture. The periwound skin appearance exhibited: Hemosiderin Staining. Periwound temperature was noted as No Abnormality. Wound #5 status is Open. Original cause of wound was Gradually Appeared. The date acquired was: 01/06/2023. The wound has been in treatment 1 weeks. The wound is located on the Left T Third. The wound measures 1cm length x 0.5cm width x 0.1cm depth; 0.393cm^2 area and 0.039cm^3 volume. There is Fat oe Layer (Subcutaneous Tissue) exposed. There is no tunneling or  undermining noted. There is a medium amount of serous drainage noted. The wound margin is distinct with the outline attached to the wound base. There is large (67-100%) red granulation within the wound bed. There is a small (1-33%) amount of necrotic tissue within the wound bed. The periwound skin appearance had no abnormalities noted for texture. The periwound skin appearance had no abnormalities noted for moisture. The periwound skin appearance exhibited: Rubor. Periwound temperature was noted as No Abnormality. Wound #6 status is Open. Original cause of wound was Gradually Appeared. The date acquired was: 01/06/2023. The wound has been in treatment 1 weeks. The wound is located on the Left T Fourth. The wound measures 0.5cm length x 0.3cm width x 0.1cm depth; 0.118cm^2 area and 0.012cm^3 volume. There is Fat oe Layer (Subcutaneous Tissue) exposed. There is no tunneling or undermining noted. There is a medium amount of serous drainage noted. The wound margin is distinct with the outline attached to the wound base. There is large (67-100%) red granulation within the wound bed. There is a small (1-33%) amount of necrotic tissue within the wound bed including Adherent Slough. The periwound skin appearance had no abnormalities noted for texture. The periwound skin appearance had no abnormalities noted for moisture. The periwound skin appearance exhibited: Rubor. Periwound temperature was noted as No Abnormality. Wound #7 status is Open. Original cause of wound was Gradually Appeared. The date acquired was: 01/13/2023. The wound is located on the Left,Distal,Medial Lower Leg. The wound measures 1.3cm length x 1.4cm width x 0.1cm depth; 1.429cm^2 area and 0.143cm^3 volume. There is Fat Layer (Subcutaneous Tissue) exposed. There is no tunneling or undermining noted. There is a medium amount of serous drainage noted. The wound margin is distinct with the outline attached to the wound base. There is large  (67-100%) red granulation within the wound bed. There is a small (1-33%) amount of necrotic tissue within the wound bed including Adherent Slough. The periwound skin appearance had no abnormalities noted for texture. The periwound skin appearance had no abnormalities noted for moisture. The periwound skin appearance had no abnormalities noted for color. Periwound temperature was noted as No Abnormality. Wound #8 status is Open. Original cause of wound was Gradually Appeared. The date acquired was: 01/13/2023. The wound is located on the Left T Fifth. The oe wound measures 0.4cm length x 0.7cm width x 0.1cm depth; 0.22cm^2 area and 0.022cm^3 volume. There is Fat Layer (Subcutaneous Tissue) exposed. There is no tunneling or undermining noted. There is a medium amount of serous drainage noted. The wound margin is distinct with the outline attached to the wound base. There is large (67-100%) red granulation within  the wound bed. There is a small (1-33%) amount of necrotic tissue within the wound bed. The periwound skin appearance had no abnormalities noted for texture. The periwound skin appearance had no abnormalities noted for moisture. The periwound skin appearance had no abnormalities noted for color. Periwound temperature was noted as No Abnormality. Wound #9 status is Open. Original cause of wound was Gradually Appeared. The date acquired was: 01/13/2023. The wound is located on the Left,Dorsal Foot. The wound measures 0.4cm length x 0.5cm width x 0.1cm depth; 0.157cm^2 area and 0.016cm^3 volume. There is Fat Layer (Subcutaneous Tissue) exposed. There is no tunneling or undermining noted. There is a medium amount of serous drainage noted. The wound margin is distinct with the outline attached to the wound base. There is medium (34-66%) red granulation within the wound bed. There is a medium (34-66%) amount of necrotic tissue within the wound bed including Adherent Slough. The periwound skin appearance  had no abnormalities noted for texture. The periwound skin appearance had no abnormalities noted for moisture. The periwound skin appearance had no abnormalities noted for color. Periwound temperature was noted as No Abnormality. 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 There was a Selective/Open Wound Non-Viable Tissue Debridement with a total area of 5.18 sq cm performed by Duanne Guess, MD. With the following instrument(s): Curette to remove Non-Viable tissue/material. Material removed includes [facility] after achieving pain control using Lidocaine 4% Topical Solution. No specimens were taken. A time out was conducted at 14:51, prior to the start of the procedure. A Minimum amount of bleeding was controlled with Pressure. The procedure was tolerated well. Post Debridement Measurements: 3.3cm length x 2cm width x 0.1cm depth; 0.518cm^3 volume. Character of Wound/Ulcer Post Debridement is improved. Severity of Tissue Post Debridement is: Fat layer exposed. Post procedure Diagnosis Wound #1: Same as Pre-Procedure General Notes: scribed for Dr. Lady Gary by Samuella Bruin, RN. Wound #4 Pre-procedure diagnosis of Wound #4 is a Venous Leg Ulcer located on the Left,Anterior Lower Leg .Severity of Tissue Pre Debridement is: Fat layer exposed. There was a Selective/Open Wound Non-Viable Tissue Debridement with a total area of 3.14 sq cm performed by Duanne Guess, MD. With the following instrument(s): Curette to remove Non-Viable tissue/material.  Material removed includes [facility] after achieving pain control using Lidocaine 4% Topical Solution. No specimens were taken. A time out was conducted at 14:51, prior to the start of the procedure. A Minimum amount of bleeding was controlled with Pressure. KAMESHA, HERNE ([id number]) [id number] Page 14 of 18 The procedure was tolerated well. Post Debridement Measurements: 2cm length x 2cm width x 0.1cm depth; 0.314cm^3 volume. Character of Wound/Ulcer Post Debridement is improved. Severity of Tissue Post Debridement is: Fat layer exposed. Post procedure Diagnosis Wound #4: Same as Pre-Procedure General Notes: scribed for Dr. Lady Gary by Samuella Bruin, RN. Wound #5 Pre-procedure diagnosis of Wound #5 is a Lymphedema located on the Left T Third . There was a Selective/Open Wound Non-Viable Tissue Debridement with oe a total area of 0.39 sq cm performed by Duanne Guess, MD. With the following instrument(s): Curette to remove Non-Viable tissue/material. Material removed includes  Slough after achieving pain control using Lidocaine 4% Topical Solution. No specimens were taken. A time out was conducted at 14:51, prior to the start of the procedure. A Minimum amount of bleeding was controlled with Pressure. The procedure was tolerated well. Post Debridement Measurements: 1cm length x 0.5cm width x 0.1cm depth; 0.039cm^3 volume. Character of Wound/Ulcer Post Debridement is improved. Post procedure Diagnosis Wound #5: Same as Pre-Procedure General Notes: scribed for Dr. Lady Gary by Samuella Bruin, RN. Wound #6 Pre-procedure diagnosis of Wound #6 is a Lymphedema located on the Left T Fourth . There was a Selective/Open Wound Non-Viable Tissue Debridement oe with a total area of 0.12 sq cm performed by Duanne Guess, MD. With the following instrument(s): Curette to remove Non-Viable tissue/material. Material removed includes [facility] after achieving pain control using  Lidocaine 4% Topical Solution. No specimens were taken. A time out was conducted at 14:51, prior to the start of the procedure. A Minimum amount of bleeding was controlled with Pressure. The procedure was tolerated well. Post Debridement Measurements: 0.5cm length x 0.3cm width x 0.1cm depth; 0.012cm^3 volume. Character of Wound/Ulcer Post Debridement is improved. Post procedure Diagnosis Wound #6: Same as Pre-Procedure General Notes: scribed for Dr. Lady Gary by Samuella Bruin, RN. Wound #7 Pre-procedure diagnosis of Wound #7 is a Venous Leg Ulcer located on the Left,Distal,Medial Lower Leg .Severity of Tissue Pre Debridement is: Fat layer exposed. There was a Selective/Open Wound Non-Viable Tissue Debridement with a total area of 1.43 sq cm performed by Duanne Guess, MD. With the following instrument(s): Curette to remove Non-Viable tissue/material. Material removed includes [facility] after achieving pain control using Lidocaine 4% Topical Solution. No specimens were taken. A time out was conducted at 14:51, prior to the start of the procedure. A Minimum amount of bleeding was controlled with Pressure. The procedure was tolerated well. Post Debridement Measurements: 1.3cm length x 1.4cm width x 0.1cm depth; 0.143cm^3 volume. Character of Wound/Ulcer Post Debridement is improved. Severity of Tissue Post Debridement is: Fat layer exposed. Post procedure Diagnosis Wound #7: Same as Pre-Procedure General Notes: scribed for Dr. Lady Gary by Samuella Bruin, RN. Wound #8 Pre-procedure diagnosis of Wound #8 is an Abrasion located on the Left T Fifth . There was a Selective/Open Wound Non-Viable Tissue Debridement with a oe total area of 0.22 sq cm performed by Duanne Guess, MD. With the following instrument(s): Curette to remove Non-Viable tissue/material. Material removed includes [facility] after achieving pain control using Lidocaine 4% Topical Solution. No specimens were taken. A time out was  conducted at 14:51, prior to the start of the procedure. A Minimum amount of bleeding was controlled with Pressure. The procedure was tolerated well. Post Debridement Measurements: 0.4cm length x 0.7cm width x 0.1cm depth; 0.022cm^3 volume. Character of Wound/Ulcer Post Debridement is improved. Post procedure Diagnosis Wound #8: Same as Pre-Procedure General Notes: scribed for Dr. Lady Gary by Samuella Bruin, RN. Wound #9 Pre-procedure diagnosis of Wound #9 is an Abrasion located on the Left,Dorsal Foot . There was a Selective/Open Wound Non-Viable Tissue Debridement with a total area of 0.16 sq cm performed by Duanne Guess, MD. With the following instrument(s): Curette to remove Non-Viable tissue/material. Material removed includes [name] after achieving pain control using Lidocaine 4% Topical Solution. No specimens were taken. A time out was conducted at 14:51, prior to the start of the procedure. A Minimum amount of bleeding was controlled with Pressure. The procedure was tolerated well. Post Debridement Measurements: 0.4cm length x 0.5cm width x 0.1cm depth; 0.016cm^3 volume. Character of Wound/Ulcer  Post Debridement is improved. Post procedure Diagnosis Wound #9: Same as Pre-Procedure General Notes: scribed for Dr. Lady Gary by Samuella Bruin, RN. Plan Follow-up Appointments: Return Appointment in 1 week. - Dr. Lady Gary - room 2 Anesthetic: (In clinic) Topical Lidocaine 4% applied to wound bed Bathing/ Shower/ Hygiene: May shower with protection but do not get wound dressing(s) wet. Protect dressing(s) with water repellant cover (for example, large plastic bag) or a cast cover and may then take shower. Edema Control - Lymphedema / SCD / Other: Elevate legs to the level of the heart or above for 30 minutes daily and/or when sitting for 3-4 times a day throughout the day. Avoid standing for long periods of time. Consults ordered were: Vascular - referral for formal venous and arterial  studies The following medication(s) was prescribed: lidocaine topical 4 % cream cream topical was prescribed at facility WOUND #1: - Lower Leg Wound Laterality: Left, Medial Cleanser: Soap and Water 1 x Per Week/30 Days Discharge Instructions: May shower and wash wound with dial antibacterial soap and water prior to dressing change. Cleanser: Wound Cleanser 1 x Per Week/30 Days Discharge Instructions: Cleanse the wound with wound cleanser prior to applying a clean dressing using gauze sponges, not tissue or cotton balls. Peri-Wound Care: Sween Lotion (Moisturizing lotion) 1 x Per Week/30 Days Discharge Instructions: Apply moisturizing lotion as directed Topical: Gentamicin 1 x Per Week/30 Days Discharge Instructions: As directed by physician Topical: Mupirocin Ointment 1 x Per Week/30 Days Discharge Instructions: Apply Mupirocin (Bactroban) as instructed Prim Dressing: Maxorb Extra Ag+ Alginate Dressing, 2x2 (in/in) 1 x Per Week/30 Days ary Discharge Instructions: Apply to wound bed as instructed Secondary Dressing: ABD Pad, 5x9 1 x Per Week/30 Days Discharge Instructions: Apply over primary dressing as directed. Secondary Dressing: Woven Gauze Sponge, Non-Sterile 4x4 in 1 x Per Week/30 Days PATRICK, SOHM ([id number]) [id number] Page 15 of 18 Discharge Instructions: Apply over primary dressing as directed. Secured With: Elastic Bandage 4 inch (ACE bandage) 1 x Per Week/30 Days Discharge Instructions: Secure with ACE bandage as directed. Secured With: [employer], 4.5x3.1 (in/yd) 1 x Per Week/30 Days Discharge Instructions: Secure with Kerlix as directed. WOUND #4: - Lower Leg Wound Laterality: Left, Anterior Cleanser: Soap and Water 1 x Per Week/30 Days Discharge Instructions: May shower and wash wound with dial antibacterial soap and water prior to dressing change. Cleanser: Wound Cleanser 1 x Per Week/30 Days Discharge Instructions: Cleanse the  wound with wound cleanser prior to applying a clean dressing using gauze sponges, not tissue or cotton balls. Peri-Wound Care: Sween Lotion (Moisturizing lotion) 1 x Per Week/30 Days Discharge Instructions: Apply moisturizing lotion as directed Topical: Gentamicin 1 x Per Week/30 Days Discharge Instructions: As directed by physician Topical: Mupirocin Ointment 1 x Per Week/30 Days Discharge Instructions: Apply Mupirocin (Bactroban) as instructed Prim Dressing: Maxorb Extra Ag+ Alginate Dressing, 2x2 (in/in) 1 x Per Week/30 Days ary Discharge Instructions: Apply to wound bed as instructed Secondary Dressing: ABD Pad, 5x9 1 x Per Week/30 Days Discharge Instructions: Apply over primary dressing as directed. Secondary Dressing: Woven Gauze Sponge, Non-Sterile 4x4 in 1 x Per Week/30 Days Discharge Instructions: Apply over primary dressing as directed. Secured With: Elastic Bandage 4 inch (ACE bandage) 1 x Per Week/30 Days Discharge Instructions: Secure with ACE bandage as directed. Secured With: [employer], 4.5x3.1 (in/yd) 1 x Per Week/30 Days Discharge Instructions: Secure with Kerlix as directed. WOUND #5: - T Third Wound Laterality: Left oe Cleanser: Soap and Water Every Other  Day/30 Days Discharge Instructions: May shower and wash wound with dial antibacterial soap and water prior to dressing change. Cleanser: Wound Cleanser Every Other Day/30 Days Discharge Instructions: Cleanse the wound with wound cleanser prior to applying a clean dressing using gauze sponges, not tissue or cotton balls. Peri-Wound Care: Sween Lotion (Moisturizing lotion) Every Other Day/30 Days Discharge Instructions: Apply moisturizing lotion as directed Topical: Gentamicin Every Other Day/30 Days Discharge Instructions: As directed by physician Topical: Mupirocin Ointment Every Other Day/30 Days Discharge Instructions: Apply Mupirocin (Bactroban) as instructed Prim Dressing: Maxorb Extra Ag+ Alginate  Dressing, 2x2 (in/in) (DME) (Generic) Every Other Day/30 Days ary Discharge Instructions: Apply to wound bed as instructed Secondary Dressing: Woven Gauze Sponge, Non-Sterile 4x4 in (DME) (Generic) Every Other Day/30 Days Discharge Instructions: Apply over primary dressing as directed. Secured With: 56M Medipore H Soft Cloth Surgical T ape, 4 x 10 (in/yd) (DME) (Generic) Every Other Day/30 Days Discharge Instructions: Secure with tape as directed. WOUND #6: - T Fourth Wound Laterality: Left oe Cleanser: Soap and Water Every Other Day/30 Days Discharge Instructions: May shower and wash wound with dial antibacterial soap and water prior to dressing change. Cleanser: Wound Cleanser Every Other Day/30 Days Discharge Instructions: Cleanse the wound with wound cleanser prior to applying a clean dressing using gauze sponges, not tissue or cotton balls. Peri-Wound Care: Sween Lotion (Moisturizing lotion) Every Other Day/30 Days Discharge Instructions: Apply moisturizing lotion as directed Topical: Gentamicin Every Other Day/30 Days Discharge Instructions: As directed by physician Topical: Mupirocin Ointment Every Other Day/30 Days Discharge Instructions: Apply Mupirocin (Bactroban) as instructed Prim Dressing: Maxorb Extra Ag+ Alginate Dressing, 2x2 (in/in) Every Other Day/30 Days ary Discharge Instructions: Apply to wound bed as instructed Secondary Dressing: Woven Gauze Sponge, Non-Sterile 4x4 in Every Other Day/30 Days Discharge Instructions: Apply over primary dressing as directed. Secured With: 56M Medipore H Soft Cloth Surgical T ape, 4 x 10 (in/yd) Every Other Day/30 Days Discharge Instructions: Secure with tape as directed. WOUND #7: - Lower Leg Wound Laterality: Left, Medial, Distal Cleanser: Soap and Water 1 x Per Week/30 Days Discharge Instructions: May shower and wash wound with dial antibacterial soap and water prior to dressing change. Cleanser: Wound Cleanser 1 x Per Week/30  Days Discharge Instructions: Cleanse the wound with wound cleanser prior to applying a clean dressing using gauze sponges, not tissue or cotton balls. Peri-Wound Care: Sween Lotion (Moisturizing lotion) 1 x Per Week/30 Days Discharge Instructions: Apply moisturizing lotion as directed Topical: Gentamicin 1 x Per Week/30 Days Discharge Instructions: As directed by physician Topical: Mupirocin Ointment 1 x Per Week/30 Days Discharge Instructions: Apply Mupirocin (Bactroban) as instructed Prim Dressing: Maxorb Extra Ag+ Alginate Dressing, 2x2 (in/in) 1 x Per Week/30 Days ary Discharge Instructions: Apply to wound bed as instructed Secondary Dressing: ABD Pad, 5x9 1 x Per Week/30 Days Discharge Instructions: Apply over primary dressing as directed. Secondary Dressing: Woven Gauze Sponge, Non-Sterile 4x4 in 1 x Per Week/30 Days Discharge Instructions: Apply over primary dressing as directed. Secured With: Elastic Bandage 4 inch (ACE bandage) 1 x Per Week/30 Days Discharge Instructions: Secure with ACE bandage as directed. Secured With: [employer], 4.5x3.1 (in/yd) 1 x Per Week/30 Days Discharge Instructions: Secure with Kerlix as directed. WOUND #8: - T Fifth Wound Laterality: Left oe Cleanser: Soap and Water Every Other Day/30 Days Discharge Instructions: May shower and wash wound with dial antibacterial soap and water prior to dressing change. Cleanser: Wound Cleanser Every Other Day/30 Days Discharge Instructions: Cleanse the wound with  wound cleanser prior to applying a clean dressing using gauze sponges, not tissue or cotton balls. Peri-Wound Care: Sween Lotion (Moisturizing lotion) Every Other Day/30 Days Discharge Instructions: Apply moisturizing lotion as directed Topical: Gentamicin Every Other Day/30 Days Discharge Instructions: As directed by physician Topical: Mupirocin Ointment Every Other Day/30 Days Discharge Instructions: Apply Mupirocin (Bactroban) as  instructed KELLENE, MCCLEARY ([id number]) [id number] Page 16 of 18 Prim Dressing: Maxorb Extra Ag+ Alginate Dressing, 2x2 (in/in) Every Other Day/30 Days ary Discharge Instructions: Apply to wound bed as instructed Secondary Dressing: Woven Gauze Sponge, Non-Sterile 4x4 in Every Other Day/30 Days Discharge Instructions: Apply over primary dressing as directed. Secured With: 62M Medipore H Soft Cloth Surgical T ape, 4 x 10 (in/yd) Every Other Day/30 Days Discharge Instructions: Secure with tape as directed. WOUND #9: - Foot Wound Laterality: Dorsal, Left Cleanser: Soap and Water Every Other Day/30 Days Discharge Instructions: May shower and wash wound with dial antibacterial soap and water prior to dressing change. Cleanser: Wound Cleanser Every Other Day/30 Days Discharge Instructions: Cleanse the wound with wound cleanser prior to applying a clean dressing using gauze sponges, not tissue or cotton balls. Cleanser: [employer] Ancillary Kit - 15 Day Supply (DME) (Generic) Every Other Day/30 Days Discharge Instructions: Use supplies as instructed; Kit contains: (15) Saline Bullets; (15) 3x3 Gauze; 15 pr Gloves Peri-Wound Care: Sween Lotion (Moisturizing lotion) Every Other Day/30 Days Discharge Instructions: Apply moisturizing lotion as directed Topical: Gentamicin Every Other Day/30 Days Discharge Instructions: As directed by physician Topical: Mupirocin Ointment Every Other Day/30 Days Discharge Instructions: Apply Mupirocin (Bactroban) as instructed Prim Dressing: Maxorb Extra Ag+ Alginate Dressing, 2x2 (in/in) (DME) (Generic) Every Other Day/30 Days ary Discharge Instructions: Apply to wound bed as instructed Secondary Dressing: Woven Gauze Sponge, Non-Sterile 4x4 in (DME) (Generic) Every Other Day/30 Days Discharge Instructions: Apply over primary dressing as directed. Secured With: 62M Medipore H Soft Cloth Surgical T ape, 4 x 10 (in/yd) (DME) (Generic) Every Other  Day/30 Days Discharge Instructions: Secure with tape as directed. 01/13/2023: She has new wounds today. The anterior tibial wound has a satellite lesion that has opened up adjacent to it. Has additional wounds on her fifth toe in addition to the existing ones on her third and fourth toe. The lateral leg wounds seem to have healed. The original medial leg wound is also smaller. I used a curette to debride slough off of all of her wounds, including the new ones. I am not entirely sure why we are struggling to get anything progressing substantially forward. We are going to get both arterial and venous studies to see if either of these is a contributing factor. I am also going to add topical gentamicin and mupirocin to all of her wounds to see if suppression of normal skin flora may be helpful; none of the wounds appear grossly infected. Will continue silver alginate with Kerlix and Ace bandage wrapping. Follow-up in 1 week. Electronic Signature(s) Unsigned Previous Signature: 01/13/2023 3:23:16 PM Version By: Duanne Guess MD FACS Entered By: Samuella Bruin on 01/13/2023 15:53:16 -------------------------------------------------------------------------------- HxROS Details Patient Name: Date of Service: [name], [name] 01/13/2023 2:15 PM Medical Record Number: [id number] Patient Account Number: [id number] Date of Birth/Sex: Treating RN: 02-Oct-1940 (82 y.o. F) Primary Care Provider: [name] Other Clinician: Referring Provider: Treating Provider/Extender: [name], [name] in Treatment: 6 Eyes Medical History: Positive for: Cataracts Hematologic/Lymphatic Medical History: Past Medical History Notes: thrombocytopenia Cardiovascular Medical History: Positive for: Arrhythmia - bundle  branch block; Hypertension; Peripheral Venous Disease Gastrointestinal Medical History: Past Medical History Notes: JAMESETTA, GREENHALGH ([id number])  [id number] Page 17 of 18 diverticulosis, GERD Endocrine Medical History: Past Medical History Notes: hypothyroidism Genitourinary Medical History: Past Medical History Notes: CKD stage III Musculoskeletal Medical History: Positive for: Gout; Osteoarthritis Past Medical History Notes: DJD, osteoporosis Oncologic Medical History: Past Medical History Notes: breast cancer HBO Extended History Items Eyes: Cataracts Immunizations Pneumococcal Vaccine: Received Pneumococcal Vaccination: Yes Received Pneumococcal Vaccination On or After 60th Birthday: No Implantable Devices No devices added Hospitalization / Surgery History Type of Hospitalization/Surgery Salpingoophorectomy Colon resection Appendectomy Cystoscopy w/ retrogrades Joint replacement Mastectomy right gastric ulcer repair Abdominal hysterectomy Family and Social History Cancer: Yes - Siblings; Diabetes: No; Heart Disease: No; Hereditary Spherocytosis: No; Hypertension: No; Kidney Disease: No; Lung Disease: Yes - Siblings; Seizures: No; Stroke: No; Thyroid Problems: No; Tuberculosis: No; Never smoker; Marital Status - Widowed; Alcohol Use: Never; Drug Use: No History; Caffeine Use: Moderate; Financial Concerns: No; Food, Clothing or Shelter Needs: No; Support System Lacking: No; Transportation Concerns: No Psychologist, prison and probation services) Signed: 01/13/2023 3:40:30 PM By: Duanne Guess MD FACS Entered By: Duanne Guess on 01/13/2023 15:12:06 -------------------------------------------------------------------------------- SuperBill Details Patient Name: Date of Service: [name], [name] 01/13/2023 Medical Record Number: [id number] Patient Account Number: [id number] Date of Birth/Sex: Treating RN: [birth year] (82 y.o. F) Primary Care Provider: [name] Other Clinician: Referring Provider: Treating Provider/Extender: [name], Frederick Peers in Treatment:  [address] 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 I10 Essential (primary) hypertension L97.522 Non-pressure chronic ulcer of other part of left foot with fat layer exposed Facility Procedures : CPT4 Code: [id number] Description: 97597 - DEBRIDE WOUND 1ST 20 SQ CM OR < ICD-10 Diagnosis Description L97.822 Non-pressure chronic ulcer of other part of left lower leg with fat layer exposed L97.522 Non-pressure chronic ulcer of other part of left foot with fat layer  exposed Modifier: Quantity: 1 Physician Procedures : CPT4 Code Description Modifier [id number] - WC PHYS LEVEL 4 - EST PT 25 ICD-10 Diagnosis Description L97.822 Non-pressure chronic ulcer of other part of left lower leg with fat layer exposed L97.522 Non-pressure chronic ulcer of other part of left  foot with fat layer exposed I87.332 Chronic venous hypertension (idiopathic) with ulcer and inflammation of left lower extremity I10 Essential (primary) hypertension Quantity: 1 : [id number] 97597 - WC PHYS DEBR WO ANESTH 20 SQ CM ICD-10 Diagnosis Description L97.822 Non-pressure chronic ulcer of other part of left lower leg with fat layer exposed L97.522 Non-pressure chronic ulcer of other part of left foot with fat layer exposed Quantity: 1 Electronic Signature(s) Signed: 01/13/2023 3:23:52 PM By: Duanne Guess MD FACS Entered By: Duanne Guess on 01/13/2023 15:23:51

## 2023-01-14 DIAGNOSIS — L97822 Non-pressure chronic ulcer of other part of left lower leg with fat layer exposed: Secondary | ICD-10-CM | POA: Diagnosis not present

## 2023-01-14 DIAGNOSIS — L97522 Non-pressure chronic ulcer of other part of left foot with fat layer exposed: Secondary | ICD-10-CM | POA: Diagnosis not present

## 2023-01-14 DIAGNOSIS — I10 Essential (primary) hypertension: Secondary | ICD-10-CM | POA: Diagnosis not present

## 2023-01-14 DIAGNOSIS — I87332 Chronic venous hypertension (idiopathic) with ulcer and inflammation of left lower extremity: Secondary | ICD-10-CM | POA: Diagnosis not present

## 2023-01-20 ENCOUNTER — Encounter (HOSPITAL_BASED_OUTPATIENT_CLINIC_OR_DEPARTMENT_OTHER): Payer: Medicare HMO | Admitting: General Surgery

## 2023-01-20 DIAGNOSIS — I890 Lymphedema, not elsewhere classified: Secondary | ICD-10-CM | POA: Diagnosis not present

## 2023-01-20 DIAGNOSIS — I87332 Chronic venous hypertension (idiopathic) with ulcer and inflammation of left lower extremity: Secondary | ICD-10-CM | POA: Diagnosis not present

## 2023-01-20 DIAGNOSIS — M109 Gout, unspecified: Secondary | ICD-10-CM | POA: Diagnosis not present

## 2023-01-20 DIAGNOSIS — I872 Venous insufficiency (chronic) (peripheral): Secondary | ICD-10-CM | POA: Diagnosis not present

## 2023-01-20 DIAGNOSIS — N183 Chronic kidney disease, stage 3 unspecified: Secondary | ICD-10-CM | POA: Diagnosis not present

## 2023-01-20 DIAGNOSIS — E039 Hypothyroidism, unspecified: Secondary | ICD-10-CM | POA: Diagnosis not present

## 2023-01-20 DIAGNOSIS — I129 Hypertensive chronic kidney disease with stage 1 through stage 4 chronic kidney disease, or unspecified chronic kidney disease: Secondary | ICD-10-CM | POA: Diagnosis not present

## 2023-01-20 DIAGNOSIS — L97822 Non-pressure chronic ulcer of other part of left lower leg with fat layer exposed: Secondary | ICD-10-CM | POA: Diagnosis not present

## 2023-01-20 DIAGNOSIS — L97522 Non-pressure chronic ulcer of other part of left foot with fat layer exposed: Secondary | ICD-10-CM | POA: Diagnosis not present

## 2023-01-20 NOTE — Progress Notes (Signed)
Erin [name], Erin [name] ([id number]) [id number] Page 1 of 14 Visit Report for 01/20/2023 Arrival Information Details Patient Name: Date of Service: [name], [name] RBA RA N. 01/20/2023 2:45 PM Medical Record Number: [id number] Patient Account Number: [id number] Date of Birth/Sex: Treating RN: 1940-11-09 (82 y.o. [name] Primary Care Yaret Hush: [name] Other Clinician: Referring Elah Avellino: Treating Damia Bobrowski/Extender: [name] Weeks in Treatment: 7 Visit Information History Since Last Visit Added or deleted any medications: No Patient Arrived: Ambulatory Any new allergies or adverse reactions: No Arrival Time: 14:52 Had a fall or experienced change in No Accompanied By: daughter activities of daily living that may affect Transfer Assistance: None risk of falls: Patient Identification Verified: Yes Signs or symptoms of abuse/neglect since last visito No Secondary Verification Process Completed: Yes Hospitalized since last visit: No Patient Requires Transmission-Based Precautions: No Implantable device outside of the clinic excluding No Patient Has Alerts: No cellular tissue based products placed in the center since last visit: Has Dressing in Place as Prescribed: Yes Has Compression in Place as Prescribed: Yes Pain Present Now: Yes Electronic Signature(s) Signed: 01/20/2023 4:38:18 PM By: Zenaida Deed RN, BSN Entered By: Zenaida Deed on 01/20/2023 14:56:32 -------------------------------------------------------------------------------- Lower Extremity Assessment Details Patient Name: Date of Service: [name], [name] 01/20/2023 2:45 PM Medical Record Number: [id number] Patient Account Number: [id number] Date of Birth/Sex: Treating RN: [birth year] (82 y.o. [name] Primary Care Carrigan Delafuente: [name] Other Clinician: Referring Sandip Power: Treating Shaasia Odle/Extender: [name],  [name] in Treatment: 7 Edema Assessment Assessed: [Left: No] [Right: No] Edema: [Left: Ye] [Right: s] Calf Left: Right: Point of Measurement: From Medial Instep 28.5 cm Ankle Left: Right: Point of Measurement: From Medial Instep 19.5 cm Vascular Assessment Pulses: Dorsalis Pedis Palpable: [Left:Yes] Erin [name], Erin [name] ([id number]) [Right:[id number] Page 2 of 14] Extremity colors, hair growth, and conditions: Extremity Color: [Left:Hyperpigmented] Hair Growth on Extremity: [Left:No] Temperature of Extremity: [Left:Warm < 3 seconds] Electronic Signature(s) Signed: 01/20/2023 4:38:18 PM By: Zenaida Deed RN, BSN Entered By: Zenaida Deed on 01/20/2023 15:02:40 -------------------------------------------------------------------------------- Multi Wound Chart Details Patient Name: Date of Service: [name], [name] 01/20/2023 2:45 PM Medical Record Number: [id number] Patient Account Number: [id number] Date of Birth/Sex: Treating RN: Oct 18, 1940 (82 y.o. 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 Primary Etiology: Cataracts, Arrhythmia, Hypertension, Cataracts, Arrhythmia, Hypertension, Cataracts, Arrhythmia, Hypertension, Comorbid History: Peripheral Venous Disease, Gout, Peripheral Venous Disease, Gout, Peripheral Venous Disease, Gout, Osteoarthritis Osteoarthritis Osteoarthritis 08/29/2022 01/06/2023 01/06/2023 Date  Acquired: 7 2 2  Weeks of Treatment: Open Open Healed - Epithelialized Wound Status: No No No Wound Recurrence: 3.3x1.8x0.1 0.5x0.6x0.1 0x0x0 Measurements L x W x D (cm) 4.665 0.236 0 A (cm) : rea 0.467 0.024 0 Volume (cm) : 20.60% 64.20% 100.00% % Reduction in Area: 20.40% 63.60% 100.00% % Reduction in Volume: Full Thickness Without Exposed Full Thickness Without Exposed Full Thickness Without Exposed Classification: Support Structures Support Structures Support Structures Medium Medium None Present Exudate Amount: Serosanguineous Serosanguineous N/A Exudate Type: red, brown red, brown N/A Exudate Color: Distinct, outline attached Distinct, outline attached N/A  Wound Margin: Small (1-33%) Medium (34-66%) None Present (0%) Granulation Amount: Red Red N/A Granulation Quality: Large (67-100%) Medium (34-66%) None Present (0%) Necrotic Amount: Fat Layer (Subcutaneous Tissue): Yes Fat Layer (Subcutaneous Tissue): Yes Fascia: No Exposed Structures: Fascia: No Fascia: No Fat Layer (Subcutaneous Tissue): No Tendon: No Tendon: No Tendon: No Muscle: No Muscle: No Muscle: No Joint: No Joint: No Joint: No Bone: No Bone: No Bone: No Small (1-33%) Medium (34-66%) Large (67-100%) Epithelialization: No Abnormalities Noted No Abnormalities Noted No Abnormalities Noted Periwound Skin Texture: No Abnormalities Noted No Abnormalities Noted No Abnormalities Noted [address]Erin [name], Erin [name] ([id number]) [id number] Page 3 of 14 No Abnormalities Noted Hemosiderin Staining: Yes Rubor: No Periwound Skin Color: No Abnormality No Abnormality No Abnormality Temperature: Yes Yes N/A Tenderness on Palpation: Wound Number: 6 7 8  Photos: Left T Fourth oe Left, Distal, Medial Lower Leg Left T Fifth oe Wound Location: Gradually Appeared Gradually Appeared Gradually Appeared Wounding Event: Lymphedema Venous Leg Ulcer Abrasion Primary  Etiology: Cataracts, Arrhythmia, Hypertension, Cataracts, Arrhythmia, Hypertension, Cataracts, Arrhythmia, Hypertension, Comorbid History: Peripheral Venous Disease, Gout, Peripheral Venous Disease, Gout, Peripheral Venous Disease, Gout, Osteoarthritis Osteoarthritis Osteoarthritis 01/06/2023 01/13/2023 01/13/2023 Date Acquired: 2 1 1  Weeks of Treatment: Healed - Epithelialized Open Healed - Epithelialized Wound Status: No No No Wound Recurrence: 0x0x0 0.3x0.3x0.1 0x0x0 Measurements L x W x D (cm) 0 0.071 0 A (cm) : rea 0 0.007 0 Volume (cm) : 100.00% 95.00% 100.00% % Reduction in Area: 100.00% 95.10% 100.00% % Reduction in Volume: Full Thickness Without Exposed Full Thickness Without Exposed Full Thickness Without Exposed Classification: Support Structures Support Structures Support Structures None Present Small None Present Exudate Amount: N/A Serous N/A Exudate Type: N/A amber N/A Exudate Color: N/A Distinct, outline attached N/A Wound Margin: None Present (0%) Large (67-100%) None Present (0%) Granulation Amount: N/A Red N/A Granulation Quality: None Present (0%) None Present (0%) None Present (0%) Necrotic Amount: Fascia: No Fat Layer (Subcutaneous Tissue): Yes Fascia: No Exposed Structures: Fat Layer (Subcutaneous Tissue): No Fascia: No Fat Layer (Subcutaneous Tissue): No Tendon: No Tendon: No Tendon: No Muscle: No Muscle: No Muscle: No Joint: No Joint: No Joint: No Bone: No Bone: No Bone: No Large (67-100%) Large (67-100%) Large (67-100%) Epithelialization: No Abnormalities Noted No Abnormalities Noted No Abnormalities Noted Periwound Skin Texture: No Abnormalities Noted No Abnormalities Noted No Abnormalities Noted Periwound Skin Moisture: Rubor: No No Abnormalities Noted No Abnormalities Noted Periwound Skin Color: No Abnormality No Abnormality No Abnormality Temperature: Wound Number: 9 N/A N/A Photos: N/A N/A Left, Dorsal Foot N/A N/A Wound  Location: Gradually Appeared N/A N/A Wounding Event: Abrasion N/A N/A Primary Etiology: Cataracts, Arrhythmia, Hypertension, N/A N/A Comorbid History: Peripheral Venous Disease, Gout, Osteoarthritis 01/13/2023 N/A N/A Date Acquired: 1 N/A N/A Weeks of Treatment: Open N/A N/A Wound Status: No N/A N/A Wound Recurrence: 0.1x0.1x0.1 N/A N/A Measurements L x W x D (cm) 0.008 N/A N/A A (cm) : rea 0.001 N/A N/A Volume (cm) : 94.90% N/A N/A % Reduction in Area: 93.80% N/A N/A % Reduction in Volume: Full Thickness Without Exposed N/A N/A Classification: Support Structures Small N/A N/A Exudate Amount: Serous N/A N/A Exudate Type: amber N/A N/A Exudate Color: Distinct, outline attached N/A N/A Wound Margin: Large (67-100%) N/A N/A Granulation Amount: Red N/A N/A Granulation QualityTOPACIO, Erin [name] ([id number]) [id number] Page 4 of 14 None Present (0%) N/A N/A Necrotic Amount: Fat Layer (Subcutaneous Tissue): Yes N/A N/A Exposed Structures: Fascia: No Tendon: No Muscle: No Joint: No Bone: No  Large (67-100%) N/A N/A Epithelialization: No Abnormalities Noted N/A N/A Periwound Skin Texture: No Abnormalities Noted N/A N/A Periwound Skin Moisture: No Abnormalities Noted N/A N/A Periwound Skin Color: No Abnormality N/A N/A Temperature: Treatment Notes Electronic Signature(s) Signed: 01/20/2023 3:30:44 PM By: Duanne Guess MD FACS Signed: 01/20/2023 4:38:18 PM By: Zenaida Deed RN, BSN Entered By: Duanne Guess on 01/20/2023 15:30:44 -------------------------------------------------------------------------------- Multi-Disciplinary Care Plan Details Patient Name: Date of Service: [name], [name] 01/20/2023 2:45 PM Medical Record Number: [id number] Patient Account Number: [id number] Date of Birth/Sex: Treating RN: [birth year] (82 y.o. [name] Primary Care Basil Buffin: [name] Other Clinician: Referring  Phelan Schadt: Treating Manessa Buley/Extender: [name] Weeks in Treatment: 7 Active Inactive Necrotic Tissue Nursing Diagnoses: Impaired tissue integrity related to necrotic/devitalized tissue Knowledge deficit related to management of necrotic/devitalized tissue Goals: Necrotic/devitalized tissue will be minimized in the wound bed Date Initiated: 11/26/2022 Target Resolution Date: 03/06/2023 Goal Status: Active Patient/caregiver will verbalize understanding of reason and process for debridement of necrotic tissue Date Initiated: 11/26/2022 Date Inactivated: 01/20/2023 Target Resolution Date: 03/06/2023 Goal Status: Met Interventions: Assess patient pain level pre-, during and post procedure and prior to discharge Provide education on necrotic tissue and debridement process Treatment Activities: Apply topical anesthetic as ordered : 11/26/2022 Notes: Wound/Skin Impairment Nursing Diagnoses: Impaired tissue integrity Knowledge deficit related to ulceration/compromised skin integrity Goals: Patient/caregiver will verbalize understanding of skin care regimen Date Initiated: 11/26/2022 Target Resolution Date: 03/06/2023 Goal Status: Active Interventions: Assess ulceration(s) every visit Erin [name], Erin [name] ([id number]) [id number] Page 5 of 14 Treatment Activities: Skin care regimen initiated : 11/26/2022 Topical wound management initiated : 11/26/2022 Notes: Electronic Signature(s) Signed: 01/20/2023 4:38:18 PM By: Zenaida Deed RN, BSN Entered By: Zenaida Deed on 01/20/2023 15:24:34 -------------------------------------------------------------------------------- Pain Assessment Details Patient Name: Date of Service: [name], [name] 01/20/2023 2:45 PM Medical Record Number: [id number] Patient Account Number: [id number] Date of Birth/Sex: Treating RN: 1940-09-14 (82 y.o. [name] Primary Care Chadwin Fury: [name]  Other Clinician: Referring Pessy Delamar: Treating Arrie Zuercher/Extender: [name] Weeks in Treatment: 7 Active Problems Location of Pain Severity and Description of Pain Patient Has Paino No Site Locations With Dressing Change: Yes Duration of the Pain. Constant / Intermittento Intermittent Rate the pain. Current Pain Level: 0 Worst Pain Level: 4 Least Pain Level: 0 Character of Pain Describe the Pain: Aching Pain Management and Medication Current Pain Management: Medication: Yes Is the Current Pain Management Adequate: Adequate How does your wound impact your activities of daily livingo Sleep: Yes Bathing: No Appetite: No Relationship With Others: No Bladder Continence: No Emotions: No Bowel Continence: No Work: No Toileting: No Drive: No Dressing: No Hobbies: No Electronic Signature(s) Signed: 01/20/2023 4:38:18 PM By: Zenaida Deed RN, BSN Entered By: Zenaida Deed on 01/20/2023 14:58:17 Erin [name] ([id number]) [id number] Page 6 of 14 -------------------------------------------------------------------------------- Patient/Caregiver Education Details Patient Name: Date of Service: [name], [name] 7/16/2024andnbsp2:45 PM Medical Record Number: [id number] Patient Account Number: [id number] Date of Birth/Gender: Treating RN: 1941/04/08 (81 y.o. [name] Primary Care Physician: [name] Other Clinician: Referring Physician: Treating Physician/Extender: [name] in Treatment: 7 Education Assessment Education Provided To: Patient Education Topics Provided Venous: Methods: Explain/Verbal Responses: Reinforcements needed, State content correctly Wound Debridement: Methods: Explain/Verbal Responses: Reinforcements needed, State content correctly Wound/Skin Impairment: Methods: Explain/Verbal Responses: Reinforcements needed, State content  correctly Electronic Signature(s) Signed: 01/20/2023 4:38:18 PM By: Zenaida Deed  RN, BSN Entered By: Zenaida Deed on 01/20/2023 15:25:09 -------------------------------------------------------------------------------- Wound Assessment Details Patient Name: Date of Service: [name], [name] RBA RA N. 01/20/2023 2:45 PM Medical Record Number: [id number] Patient Account Number: [id number] Date of Birth/Sex: Treating RN: 1940-11-18 (82 y.o. [name] Primary Care Binta Statzer: [name] Other Clinician: Referring Darek Eifler: Treating Elisabeth Strom/Extender: [name], [name] in Treatment: 7 Wound Status Wound Number: 1 Primary Venous Leg Ulcer Etiology: Wound Location: Left, Medial Lower Leg Wound Open Wounding Event: Gradually Appeared Status: Date Acquired: 08/29/2022 Comorbid Cataracts, Arrhythmia, Hypertension, Peripheral Venous Weeks Of Treatment: 7 History: Disease, Gout, Osteoarthritis Clustered Wound: No Photos Erin [name], Erin [name] ([id number]) [id number] Page 7 of 14 Wound Measurements Length: (cm) 3.3 Width: (cm) 1.8 Depth: (cm) 0.1 Area: (cm) 4.665 Volume: (cm) 0.467 % Reduction in Area: 20.6% % Reduction in Volume: 20.4% Epithelialization: Small (1-33%) Tunneling: No Undermining: No Wound Description Classification: Full Thickness Without Exposed Support Structures Wound Margin: Distinct, outline attached Exudate Amount: Medium Exudate Type: Serosanguineous Exudate Color: red, brown Foul Odor After Cleansing: No Slough/Fibrino Yes Wound Bed Granulation Amount: Small (1-33%) Exposed Structure Granulation Quality: Red Fascia Exposed: No Necrotic Amount: Large (67-100%) Fat Layer (Subcutaneous Tissue) Exposed: Yes Necrotic Quality: Adherent Slough Tendon Exposed: No Muscle Exposed: No Joint Exposed: No Bone Exposed: No Periwound Skin Texture Texture Color No Abnormalities Noted: Yes No Abnormalities  Noted: Yes Moisture Temperature / Pain No Abnormalities Noted: Yes Temperature: No Abnormality Tenderness on Palpation: Yes Electronic Signature(s) Signed: 01/20/2023 4:38:18 PM By: Zenaida Deed RN, BSN Entered By: Zenaida Deed on 01/20/2023 15:19:42 -------------------------------------------------------------------------------- Wound Assessment Details Patient Name: Date of Service: [name], [name] 01/20/2023 2:45 PM Medical Record Number: [id number] Patient Account Number: [id number] Date of Birth/Sex: Treating RN: [birth year] (83 y.o. [name] Primary Care Purcell Jungbluth: [name] Other Clinician: Referring Charlette Hennings: Treating Mattalyn Anderegg/Extender: [name], [name] in Treatment: 7 Wound Status Wound Number: 4 Primary Venous Leg Ulcer Etiology: Wound Location: Left, Anterior Lower Leg Wound Open Wounding Event: Gradually Appeared Status: Date Acquired: 01/06/2023 Comorbid Cataracts, Arrhythmia, Hypertension, Peripheral Venous Weeks Of Treatment: 2 History: Disease, Gout, Osteoarthritis Clustered Wound: No Photos Erin [name], Erin [name] ([id number]) [id number] Page 8 of 14 Wound Measurements Length: (cm) 0.5 Width: (cm) 0.6 Depth: (cm) 0.1 Area: (cm) 0.236 Volume: (cm) 0.024 % Reduction in Area: 64.2% % Reduction in Volume: 63.6% Epithelialization: Medium (34-66%) Tunneling: No Undermining: No Wound Description Classification: Full Thickness Without Exposed Support Wound Margin: Distinct, outline attached Exudate Amount: Medium Exudate Type: Serosanguineous Exudate Color: red, brown Structures Foul Odor After Cleansing: No Slough/Fibrino Yes Wound Bed Granulation Amount: Medium (34-66%) Exposed Structure Granulation Quality: Red Fascia Exposed: No Necrotic Amount: Medium (34-66%) Fat Layer (Subcutaneous Tissue) Exposed: Yes Necrotic Quality: Adherent Slough Tendon Exposed: No Muscle Exposed:  No Joint Exposed: No Bone Exposed: No Periwound Skin Texture Texture Color No Abnormalities Noted: Yes No Abnormalities Noted: No Hemosiderin Staining: Yes Moisture No Abnormalities Noted: Yes Temperature / Pain Temperature: No Abnormality Tenderness on Palpation: Yes Electronic Signature(s) Signed: 01/20/2023 4:38:18 PM By: Zenaida Deed RN, BSN Entered By: Zenaida Deed on 01/20/2023 15:20:19 -------------------------------------------------------------------------------- Wound Assessment Details Patient Name: Date of Service: [name], [name] 01/20/2023 2:45 PM Medical Record Number: [id number] Patient Account Number: [id number] Date of Birth/Sex: Treating RN: December 12, 1940 (82 y.o. [name] Primary Care Maley Venezia: [name] Other Clinician: Referring Kadisha Goodine: Treating Miu Chiong/Extender: [name], [name] in Treatment: 7 Wound  Status Wound Number: 5 Primary Lymphedema Etiology: Wound Location: Left T Third oe Wound Healed - Epithelialized Wounding Event: Gradually Appeared Status: Date Acquired: 01/06/2023 Comorbid Cataracts, Arrhythmia, Hypertension, Peripheral Venous Weeks Of Treatment: 2 History: Disease, Gout, Osteoarthritis Clustered Wound: No Erin [name], Erin [name] ([id number]) [id number] Page 9 of 14 Photos Wound Measurements Length: (cm) Width: (cm) Depth: (cm) Area: (cm) Volume: (cm) 0 % Reduction in Area: 100% 0 % Reduction in Volume: 100% 0 Epithelialization: Large (67-100%) 0 Tunneling: No 0 Undermining: No Wound Description Classification: Full Thickness Without Exposed Support Structures Exudate Amount: None Present Foul Odor After Cleansing: No Slough/Fibrino No Wound Bed Granulation Amount: None Present (0%) Exposed Structure Necrotic Amount: None Present (0%) Fascia Exposed: No Fat Layer (Subcutaneous Tissue) Exposed: No Tendon Exposed: No Muscle Exposed: No Joint  Exposed: No Bone Exposed: No Periwound Skin Texture Texture Color No Abnormalities Noted: Yes No Abnormalities Noted: Yes Moisture Temperature / Pain No Abnormalities Noted: Yes Temperature: No Abnormality Electronic Signature(s) Signed: 01/20/2023 4:38:18 PM By: Zenaida Deed RN, BSN Entered By: Zenaida Deed on 01/20/2023 15:21:14 -------------------------------------------------------------------------------- Wound Assessment Details Patient Name: Date of Service: [name], [name] 01/20/2023 2:45 PM Medical Record Number: [id number] Patient Account Number: [id number] Date of Birth/Sex: Treating RN: May 15, 1941 (82 y.o. [name] Primary Care Amariss Detamore: [name] Other Clinician: Referring Lynnell Fiumara: Treating Karla Vines/Extender: [name], [name] in Treatment: 7 Wound Status Wound Number: 6 Primary Lymphedema Etiology: Wound Location: Left T Fourth oe Wound Healed - Epithelialized Wounding Event: Gradually Appeared Status: Date Acquired: 01/06/2023 Comorbid Cataracts, Arrhythmia, Hypertension, Peripheral Venous Weeks Of Treatment: 2 History: Disease, Gout, Osteoarthritis Clustered Wound: No Photos Erin [name], Erin [name] ([id number]) [id number] Page 10 of 14 Wound Measurements Length: (cm) Width: (cm) Depth: (cm) Area: (cm) Volume: (cm) 0 % Reduction in Area: 100% 0 % Reduction in Volume: 100% 0 Epithelialization: Large (67-100%) 0 Tunneling: No 0 Undermining: No Wound Description Classification: Full Thickness Without Exposed Support Structures Exudate Amount: None Present Foul Odor After Cleansing: No Slough/Fibrino No Wound Bed Granulation Amount: None Present (0%) Exposed Structure Necrotic Amount: None Present (0%) Fascia Exposed: No Fat Layer (Subcutaneous Tissue) Exposed: No Tendon Exposed: No Muscle Exposed: No Joint Exposed: No Bone Exposed: No Periwound Skin Texture Texture  Color No Abnormalities Noted: Yes No Abnormalities Noted: Yes Moisture Temperature / Pain No Abnormalities Noted: Yes Temperature: No Abnormality Electronic Signature(s) Signed: 01/20/2023 4:38:18 PM By: Zenaida Deed RN, BSN Entered By: Zenaida Deed on 01/20/2023 15:22:00 -------------------------------------------------------------------------------- Wound Assessment Details Patient Name: Date of Service: [name], [name] 01/20/2023 2:45 PM Medical Record Number: [id number] Patient Account Number: [id number] Date of Birth/Sex: Treating RN: 1941-03-30 (82 y.o. [name] Primary Care Saxon Crosby: [name] Other Clinician: Referring Teletha Petrea: Treating Lainy Wrobleski/Extender: [name], [name] in Treatment: 7 Wound Status Wound Number: 7 Primary Venous Leg Ulcer Etiology: Wound Location: Left, Distal, Medial Lower Leg Wound Open Wounding Event: Gradually Appeared Status: Date Acquired: 01/13/2023 Comorbid Cataracts, Arrhythmia, Hypertension, Peripheral Venous Weeks Of Treatment: 1 History: Disease, Gout, Osteoarthritis Clustered Wound: No Photos Erin [name], Erin [name] ([id number]) [id number] Page 11 of 14 Wound Measurements Length: (cm) 0.3 Width: (cm) 0.3 Depth: (cm) 0.1 Area: (cm) 0.071 Volume: (cm) 0.007 % Reduction in Area: 95% % Reduction in Volume: 95.1% Epithelialization: Large (67-100%) Tunneling: No Undermining: No Wound Description Classification: Full Thickness Without Exposed Support Structures Wound Margin: Distinct, outline attached Exudate Amount: Small Exudate Type: Serous Exudate Color: amber Foul Odor  After Cleansing: No Slough/Fibrino No Wound Bed Granulation Amount: Large (67-100%) Exposed Structure Granulation Quality: Red Fascia Exposed: No Necrotic Amount: None Present (0%) Fat Layer (Subcutaneous Tissue) Exposed: Yes Tendon Exposed: No Muscle Exposed: No Joint Exposed:  No Bone Exposed: No Periwound Skin Texture Texture Color No Abnormalities Noted: Yes No Abnormalities Noted: Yes Moisture Temperature / Pain No Abnormalities Noted: Yes Temperature: No Abnormality Electronic Signature(s) Signed: 01/20/2023 4:38:18 PM By: Zenaida Deed RN, BSN Entered By: Zenaida Deed on 01/20/2023 15:22:33 -------------------------------------------------------------------------------- Wound Assessment Details Patient Name: Date of Service: [name], [name] 01/20/2023 2:45 PM Medical Record Number: [id number] Patient Account Number: [id number] Date of Birth/Sex: Treating RN: 1940/09/26 (82 y.o. [name] Primary Care Jadie Allington: [name] Other Clinician: Referring Dashanae Longfield: Treating Vern Prestia/Extender: [name], [name] in Treatment: 7 Wound Status Wound Number: 8 Primary Abrasion Etiology: Wound Location: Left T Fifth oe Wound Healed - Epithelialized Wounding Event: Gradually Appeared Status: Date Acquired: 01/13/2023 Comorbid Cataracts, Arrhythmia, Hypertension, Peripheral Venous Weeks Of Treatment: 1 History: Disease, Gout, Osteoarthritis Clustered Wound: No Photos Erin [name], Erin [name] ([id number]) [id number] Page 12 of 14 Wound Measurements Length: (cm) Width: (cm) Depth: (cm) Area: (cm) Volume: (cm) 0 % Reduction in Area: 100% 0 % Reduction in Volume: 100% 0 Epithelialization: Large (67-100%) 0 Tunneling: No 0 Undermining: No Wound Description Classification: Full Thickness Without Exposed Support Structures Exudate Amount: None Present Foul Odor After Cleansing: No Slough/Fibrino No Wound Bed Granulation Amount: None Present (0%) Exposed Structure Necrotic Amount: None Present (0%) Fascia Exposed: No Fat Layer (Subcutaneous Tissue) Exposed: No Tendon Exposed: No Muscle Exposed: No Joint Exposed: No Bone Exposed: No Periwound Skin Texture Texture Color No  Abnormalities Noted: Yes No Abnormalities Noted: Yes Moisture Temperature / Pain No Abnormalities Noted: Yes Temperature: No Abnormality Electronic Signature(s) Signed: 01/20/2023 4:38:18 PM By: Zenaida Deed RN, BSN Entered By: Zenaida Deed on 01/20/2023 15:23:16 -------------------------------------------------------------------------------- Wound Assessment Details Patient Name: Date of Service: [name], [name] 01/20/2023 2:45 PM Medical Record Number: [id number] Patient Account Number: [id number] Date of Birth/Sex: Treating RN: 1940-10-12 (82 y.o. 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, Gout, Osteoarthritis Clustered Wound: No Photos Erin [name], KRASZEWSKI ([id number]) [id number] Page 13 of 14 Wound Measurements Length: (cm) 0.1 Width: (cm) 0.1 Depth: (cm) 0.1 Area: (cm) 0.008 Volume: (cm) 0.001 % Reduction in Area: 94.9% % Reduction in Volume: 93.8% Epithelialization: Large (67-100%) Tunneling: No Undermining: No Wound Description Classification: Full Thickness Without Exposed Support Wound Margin: Distinct, outline attached Exudate Amount: Small Exudate Type: Serous Exudate Color: amber Structures Foul Odor After Cleansing: No Slough/Fibrino Yes Wound Bed Granulation Amount: Large (67-100%) Exposed Structure Granulation Quality: Red Fascia Exposed: No Necrotic Amount: None Present (0%) Fat Layer (Subcutaneous Tissue) Exposed: Yes Tendon Exposed: No Muscle Exposed: No Joint Exposed: No Bone Exposed: No Periwound  Skin Texture Texture Color No Abnormalities Noted: Yes No Abnormalities Noted: Yes Moisture Temperature / Pain No Abnormalities Noted: Yes Temperature: No Abnormality Electronic Signature(s) Signed: 01/20/2023 4:38:18 PM By: Zenaida Deed RN, BSN Entered By: Zenaida Deed on 01/20/2023 15:24:04 -------------------------------------------------------------------------------- Vitals Details Patient Name: Date of Service: [name], [name] 01/20/2023 2:45 PM  Medical Record Number: [id number] Patient Account Number: [id number] Date of Birth/Sex: Treating RN: 22-Feb-1941 (82 y.o. [name] Primary Care Verita Kuroda: [name] Other Clinician: Referring Velva Molinari: Treating Cleston Lautner/Extender: [name], [name] in Treatment: 7 Vital Signs Time Taken: 14:56 Temperature (F): 98.3 Height (in): 60 Pulse (bpm): 75 Weight (lbs): 138 Respiratory Rate (breaths/min): 18 Body Mass Index (BMI): 26.9 Blood Pressure (mmHg): 216/86 Reference Range: 80 - 120 mg / dl Electronic Signature(s) LEE-ANN, GAL ([id number]) [id number] Page 14 of 14 Signed: 01/20/2023 4:38:18 PM By: Zenaida Deed RN, BSN Entered By: Zenaida Deed on 01/20/2023 14:57:12

## 2023-01-20 NOTE — Progress Notes (Addendum)
Erin [name], Erin [name] ([id number]) [id number] Page 1 of 12 Visit Report for 01/20/2023 Chief Complaint Document Details Patient Name: Date of Service: [name], [name] RBA RA N. 01/20/2023 2:45 PM Medical Record Number: [id number] Patient Account Number: [id number] Date of Birth/Sex: Treating RN: 03-23-1941 (82 y.o. [name] Primary Care Provider: [name] Other Clinician: Referring Provider: Treating Provider/Extender: [name] Weeks in Treatment: 7 Information Obtained from: Patient Chief Complaint Patient presents for treatment of an open ulcer due to venous insufficiency Electronic Signature(s) Signed: 01/20/2023 3:30:52 PM By: Duanne Guess MD FACS Entered By: Duanne Guess on 01/20/2023 15:30:52 -------------------------------------------------------------------------------- Debridement Details Patient Name: Date of Service: [name], [name] 01/20/2023 2:45 PM Medical Record Number: [id number] Patient Account Number: [id number] Date of Birth/Sex: Treating RN: 03/15/1941 (82 y.o. [name] Primary Care Provider: [name] Other Clinician: Referring Provider: Treating Provider/Extender: [name] Weeks in Treatment: 7 Debridement Performed for Assessment: Wound #4 Left,Anterior Lower Leg Performed By: Physician Duanne Guess, MD Debridement Type: Debridement Severity of Tissue Pre Debridement: Fat layer exposed Level of Consciousness (Pre-procedure): Awake and Alert Pre-procedure Verification/Time Out Yes - 15:30 Taken: Start Time: 15:32 Pain Control: Lidocaine 4% T opical Solution Percent of Wound Bed Debrided: 100% T Area Debrided (cm): otal 0.24 Tissue and other material debrided: Non-Viable, Slough, Slough Level: Non-Viable Tissue Debridement Description: Selective/Open Wound Instrument: Curette Bleeding: Minimum Hemostasis Achieved:  Pressure Procedural Pain: 0 Post Procedural Pain: 0 Response to Treatment: Procedure was tolerated well Level of Consciousness (Post- Awake and Alert procedure): Post Debridement Measurements of Total Wound Length: (cm) 0.5 Width: (cm) 0.6 Depth: (cm) 0.1 Volume: (cm) 0.024 Character of Wound/Ulcer Post Debridement: Improved Severity of Tissue Post Debridement: Fat layer exposed Erin [name], Erin [name] ([id number]) [id number] Page 2 of 12 Post Procedure Diagnosis Same as Pre-procedure Notes Scribed for Dr Lady Gary by Zenaida Deed, RN Electronic Signature(s) Signed: 01/20/2023 3:51:58 PM By: Duanne Guess MD FACS Signed: 01/20/2023 4:38:18 PM By: Zenaida Deed RN, BSN Entered By: Zenaida Deed on 01/20/2023 15:36:58 -------------------------------------------------------------------------------- Debridement Details Patient Name: Date of Service: [name], [name] 01/20/2023 2:45 PM Medical Record Number: [id number] Patient Account Number: [id number] Date of Birth/Sex: Treating RN: [birth year] (82 y.o. [name] Primary Care Provider: [name] Other Clinician: Referring Provider: Treating Provider/Extender: [name] Weeks in Treatment: 7 Debridement Performed for Assessment: Wound #1 Left,Medial Lower Leg Performed By: Physician Duanne Guess, MD Debridement Type: Debridement Severity of Tissue Pre Debridement: Fat layer exposed Level of Consciousness (Pre-procedure): Awake and Alert Pre-procedure Verification/Time Out Yes - 15:30 Taken: Start Time: 15:32 Pain Control: Lidocaine 4% T opical Solution Percent of Wound Bed Debrided: 100% T Area Debrided (cm): otal 4.66 Tissue and other material debrided: Non-Viable, Slough, Slough Level: Non-Viable Tissue Debridement Description: Selective/Open Wound Instrument: Curette Bleeding: Minimum Hemostasis Achieved: Pressure Procedural Pain: 0 Post  Procedural Pain: 0 Response to Treatment: Procedure was tolerated well Level of Consciousness (Post- Awake and Alert procedure): Post Debridement Measurements of Total Wound Length: (cm) 3.3 Width: (cm) 1.8 Depth: (cm) 0.1 Volume: (cm) 0.467 Character of Wound/Ulcer Post Debridement: Improved Severity of Tissue Post Debridement: Fat layer exposed Post Procedure Diagnosis Same as Pre-procedure Notes Scribed for Dr Lady Gary by Zenaida Deed, RN Electronic Signature(s) Signed: 01/20/2023 3:51:58 PM By: Duanne Guess MD FACS Signed: 01/20/2023 4:38:18 PM By: Zenaida Deed RN, BSN Entered By: Zenaida Deed on 01/20/2023 15:38:03 Erin [name] ([id number]) [id number]  Page 3 of 12 -------------------------------------------------------------------------------- HPI Details Patient Name: Date of Service: [name], [name] 01/20/2023 2:45 PM Medical Record Number: [id number] Patient Account Number: [id number] Date of Birth/Sex: Treating RN: February 25, 1941 (82 y.o. [name] Primary Care Provider: [name] Other Clinician: Referring Provider: Treating Provider/Extender: [name] Weeks in Treatment: 7 History of Present Illness HPI Description: ADMISSION 11/26/2022 This is an 82 year old woman who was referred by her primary care provider for a persistent nonhealing wound on her left lower extremity. It has been present since February of this year. 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 I do not see any formal venous reflux studies in the electronic medical record. She is not diabetic. She does not smoke. Her PCP has been washing her leg each week and applying a Kerlix and Coban wrap, but has not performed any formal debridement and has not been using any sort of topical contact layer or ointment. ABI in clinic was  noncompressible, but she has a palpable pedal pulse. 12/10/2022: The wound is a bit smaller and cleaner today. There is still fairly heavy slough on the surface. Edema control is acceptable. 12/16/2022: The wound is measuring the slightest bit smaller today. There is still thick slough on the surface. Edema control is good. 12/23/2022: The wound is a little bit smaller again today. Still with fairly heavy slough accumulation. Edema control is good. She has a new small wound on the proximal portion of the same leg. It looks as though she may have developed a small blister over the weekend subsequently ruptured. There is minimal slough on the surface. 12/30/2022: The wound is about the same size, but substantially cleaner. There is some hypertrophic granulation tissue starting to emerge. The wound that was new last week has closed but she has a different new wound today on the lateral aspect of her left lower leg. It also appears to have been a blister that opened and ruptured. There is a little bit of slough on the surface. 01/06/2023: She has new wounds today. She has a wound on the PIP knuckle of her left third and fourth toes, as well as an anterior tibial wound. She says that the wrap was too tight and it also looks like when she pushes her foot into her shoe, it shifts the wrapping back to her midfoot causing bunching up of the wrap material and pain. The lateral leg wounds are both smaller with a little slough and eschar present. The original medial leg wound is smaller and more superficial. There is slough on the surface. 01/13/2023: She has new wounds today. The anterior tibial wound has a satellite lesion that has opened up adjacent to it. Has additional wounds on her fifth toe in addition to the existing ones on her third and fourth toe. The lateral leg wounds seem to have healed. The original medial leg wound is also smaller, but she and her daughter have several questions regarding why we are not  making forward progress, all of which are very reasonable. 01/20/2023: The wounds on her toes have healed. There is a tiny wound on the dorsum of her foot with a little bit of slough on it. The anterolateral leg wound has epithelialized quite substantially and the satellite that had opened last week is now closed. The medial leg wound measured narrower today. It has hypertrophic granulation tissue and slough present.  Electronic Signature(s) Signed: 01/20/2023 3:43:06 PM By: Duanne Guess MD FACS Entered By: Duanne Guess on 01/20/2023 15:43:06 -------------------------------------------------------------------------------- Physical Exam Details Patient Name: Date of Service: [name], [name] 01/20/2023 2:45 PM Medical Record Number: [id number] Patient Account Number: [id number] Date of Birth/Sex: Treating RN: 30-Oct-1940 (82 y.o. [name] Primary Care Provider: [name] Other Clinician: Referring Provider: Treating Provider/Extender: [name], [name] in Treatment: 7 Constitutional Hypertensive, asymptomatic. . . . no acute distress. Respiratory Normal work of breathing on room air. Notes Erin [name], Erin [name] ([id number]) [id number] Page 4 of 12 01/20/2023: The wounds on her toes have healed. There is a tiny wound on the dorsum of her foot with a little bit of slough on it. The anterolateral leg wound has epithelialized quite substantially and the satellite that had opened last week is now closed. The medial leg wound measured narrower today. It has hypertrophic granulation tissue and slough present. Electronic Signature(s) Signed: 01/20/2023 3:44:20 PM By: Duanne Guess MD FACS Entered By: Duanne Guess on 01/20/2023 15:44:20 -------------------------------------------------------------------------------- Physician Orders Details Patient Name: Date of Service: [name], [name] 01/20/2023 2:45  PM Medical Record Number: [id number] Patient Account Number: [id number] Date of Birth/Sex: Treating RN: Dec 13, 1940 (82 y.o. [name] Primary Care Provider: [name] Other Clinician: Referring Provider: Treating Provider/Extender: [name] Weeks in Treatment: 7 Verbal / Phone Orders: No Diagnosis Coding ICD-10 Coding Code Description [id number] Non-pressure chronic ulcer of other part of left lower leg with fat layer exposed I87.332 Chronic venous hypertension (idiopathic) with ulcer and inflammation of left lower extremity I10 Essential (primary) hypertension L97.522 Non-pressure chronic ulcer of other part of left foot with fat layer exposed Follow-up Appointments ppointment in 1 week. - Dr. Lady Gary - room 2 Return A Anesthetic (In clinic) Topical Lidocaine 4% applied to wound bed Bathing/ Shower/ Hygiene May shower with protection but do not get wound dressing(s) wet. Protect dressing(s) with water repellant cover (for example, large plastic bag) or a cast cover and may then take shower. Edema Control - Lymphedema / SCD / Other Elevate legs to the level of the heart or above for 30 minutes daily and/or when sitting for 3-4 times a day throughout the day. Avoid standing for long periods of time. Exercise regularly Wound Treatment Wound #1 - Lower Leg Wound Laterality: Left, Medial Cleanser: Soap and Water 1 x Per Week/30 Days Discharge Instructions: May shower and wash wound with dial antibacterial soap and water prior to dressing change. Cleanser: Wound Cleanser 1 x Per Week/30 Days Discharge Instructions: Cleanse the wound with wound cleanser prior to applying a clean dressing using gauze sponges, not tissue or cotton balls. Peri-Wound Care: Sween Lotion (Moisturizing lotion) 1 x Per Week/30 Days Discharge Instructions: Apply moisturizing lotion as directed Topical: Gentamicin 1 x Per Week/30 Days Discharge Instructions: As directed by  physician Topical: Mupirocin Ointment 1 x Per Week/30 Days Discharge Instructions: Apply Mupirocin (Bactroban) as instructed Prim Dressing: Maxorb Extra Ag+ Alginate Dressing, 2x2 (in/in) 1 x Per Week/30 Days ary Discharge Instructions: Apply to wound bed as instructed Secondary Dressing: ABD Pad, 5x9 1 x Per Week/30 Days Discharge Instructions: Apply over primary dressing as directed. Secondary Dressing: Woven Gauze Sponge, Non-Sterile 4x4 in 1 x Per Week/30 Days Discharge Instructions: Apply over primary dressing as directed. Erin [name], Erin [name] ([id number]) [id number] Page 5 of 12 Secured With: Elastic Bandage 4 inch (ACE bandage) 1 x Per Week/30 Days Discharge Instructions:  Secure with ACE bandage as directed. Secured With: [employer], 4.5x3.1 (in/yd) 1 x Per Week/30 Days Discharge Instructions: Secure with Kerlix as directed. Wound #4 - Lower Leg Wound Laterality: Left, Anterior Cleanser: Soap and Water 1 x Per Week/30 Days Discharge Instructions: May shower and wash wound with dial antibacterial soap and water prior to dressing change. Cleanser: Wound Cleanser 1 x Per Week/30 Days Discharge Instructions: Cleanse the wound with wound cleanser prior to applying a clean dressing using gauze sponges, not tissue or cotton balls. Peri-Wound Care: Sween Lotion (Moisturizing lotion) 1 x Per Week/30 Days Discharge Instructions: Apply moisturizing lotion as directed Topical: Gentamicin 1 x Per Week/30 Days Discharge Instructions: As directed by physician Topical: Mupirocin Ointment 1 x Per Week/30 Days Discharge Instructions: Apply Mupirocin (Bactroban) as instructed Prim Dressing: Maxorb Extra Ag+ Alginate Dressing, 2x2 (in/in) 1 x Per Week/30 Days ary Discharge Instructions: Apply to wound bed as instructed Secondary Dressing: ABD Pad, 5x9 1 x Per Week/30 Days Discharge Instructions: Apply over primary dressing as directed. Secondary Dressing: Woven  Gauze Sponge, Non-Sterile 4x4 in 1 x Per Week/30 Days Discharge Instructions: Apply over primary dressing as directed. Secured With: Elastic Bandage 4 inch (ACE bandage) 1 x Per Week/30 Days Discharge Instructions: Secure with ACE bandage as directed. Secured With: [employer], 4.5x3.1 (in/yd) 1 x Per Week/30 Days Discharge Instructions: Secure with Kerlix as directed. Wound #7 - Lower Leg Wound Laterality: Left, Medial, Distal Cleanser: Soap and Water 1 x Per Week/30 Days Discharge Instructions: May shower and wash wound with dial antibacterial soap and water prior to dressing change. Cleanser: Wound Cleanser 1 x Per Week/30 Days Discharge Instructions: Cleanse the wound with wound cleanser prior to applying a clean dressing using gauze sponges, not tissue or cotton balls. Peri-Wound Care: Sween Lotion (Moisturizing lotion) 1 x Per Week/30 Days Discharge Instructions: Apply moisturizing lotion as directed Topical: Gentamicin 1 x Per Week/30 Days Discharge Instructions: As directed by physician Topical: Mupirocin Ointment 1 x Per Week/30 Days Discharge Instructions: Apply Mupirocin (Bactroban) as instructed Prim Dressing: Maxorb Extra Ag+ Alginate Dressing, 2x2 (in/in) 1 x Per Week/30 Days ary Discharge Instructions: Apply to wound bed as instructed Secondary Dressing: ABD Pad, 5x9 1 x Per Week/30 Days Discharge Instructions: Apply over primary dressing as directed. Secondary Dressing: Woven Gauze Sponge, Non-Sterile 4x4 in 1 x Per Week/30 Days Discharge Instructions: Apply over primary dressing as directed. Secured With: Elastic Bandage 4 inch (ACE bandage) 1 x Per Week/30 Days Discharge Instructions: Secure with ACE bandage as directed. Secured With: [employer], 4.5x3.1 (in/yd) 1 x Per Week/30 Days Discharge Instructions: Secure with Kerlix as directed. Wound #9 - Foot Wound Laterality: Dorsal, Left Cleanser: Soap and Water Every Other Day/30 Days Discharge  Instructions: May shower and wash wound with dial antibacterial soap and water prior to dressing change. Cleanser: Wound Cleanser Every Other Day/30 Days Discharge Instructions: Cleanse the wound with wound cleanser prior to applying a clean dressing using gauze sponges, not tissue or cotton balls. Peri-Wound Care: Sween Lotion (Moisturizing lotion) Every Other Day/30 Days Erin [name], Erin [name] ([id number]) [id number] Page 6 of 12 Discharge Instructions: Apply moisturizing lotion as directed Topical: Gentamicin Every Other Day/30 Days Discharge Instructions: As directed by physician Topical: Mupirocin Ointment Every Other Day/30 Days Discharge Instructions: Apply Mupirocin (Bactroban) as instructed Prim Dressing: Maxorb Extra Ag+ Alginate Dressing, 2x2 (in/in) (Generic) Every Other Day/30 Days ary Discharge Instructions: Apply to wound bed as instructed Secondary Dressing: Woven Gauze Sponge, Non-Sterile 4x4 in (Generic)  Every Other Day/30 Days Discharge Instructions: Apply over primary dressing as directed. Secured With: 40M Medipore H Soft Cloth Surgical T ape, 4 x 10 (in/yd) (Generic) Every Other Day/30 Days Discharge Instructions: Secure with tape as directed. Electronic Signature(s) Signed: 01/20/2023 3:51:58 PM By: Duanne Guess MD FACS Entered By: Duanne Guess on 01/20/2023 15:49:00 -------------------------------------------------------------------------------- Problem List Details Patient Name: Date of Service: [name], [name] 01/20/2023 2:45 PM Medical Record Number: [id number] Patient Account Number: [id number] Date of Birth/Sex: Treating RN: 1941-02-07 (82 y.o. [name] Primary Care Provider: [name] Other Clinician: Referring Provider: Treating Provider/Extender: [name] Weeks in Treatment: 7 Active Problems ICD-10 Encounter Code Description Active Date MDM Diagnosis [phone]  Non-pressure chronic ulcer of other part of left lower leg with fat layer exposed5/22/2024 No Yes I87.332 Chronic venous hypertension (idiopathic) with ulcer and inflammation of left 11/26/2022 No Yes lower extremity I10 Essential (primary) hypertension 11/26/2022 No Yes L97.522 Non-pressure chronic ulcer of other part of left foot with fat layer exposed 01/13/2023 No Yes Inactive Problems Resolved Problems Electronic Signature(s) Signed: 01/20/2023 3:30:28 PM By: Duanne Guess MD FACS Entered By: Duanne Guess on 01/20/2023 15:30:28 Erin [name] ([id number]) [id number] Page 7 of 12 -------------------------------------------------------------------------------- Progress Note Details Patient Name: Date of Service: [name], [name] 01/20/2023 2:45 PM Medical Record Number: [id number] Patient Account Number: [id number] Date of Birth/Sex: Treating RN: [birth year] (82 y.o. [name] Primary Care Provider: [name] Other Clinician: Referring Provider: Treating Provider/Extender: [name] Weeks in Treatment: 7 Subjective Chief Complaint Information obtained from Patient Patient presents for treatment of an open ulcer due to venous insufficiency History of Present Illness (HPI) ADMISSION 11/26/2022 This is an 82 year old woman who was referred by her primary care provider for a persistent nonhealing wound on her left lower extremity. It has been present since February of this year. 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 I do not see any formal venous reflux studies in the electronic medical record. She is not diabetic. She does not smoke. Her PCP has been washing her leg each week and applying a Kerlix and Coban wrap, but has not performed any formal debridement and has not been using any sort of topical contact  layer or ointment. ABI in clinic was noncompressible, but she has a palpable pedal pulse. 12/10/2022: The wound is a bit smaller and cleaner today. There is still fairly heavy slough on the surface. Edema control is acceptable. 12/16/2022: The wound is measuring the slightest bit smaller today. There is still thick slough on the surface. Edema control is good. 12/23/2022: The wound is a little bit smaller again today. Still with fairly heavy slough accumulation. Edema control is good. She has a new small wound on the proximal portion of the same leg. It looks as though she may have developed a small blister over the weekend subsequently ruptured. There is minimal slough on the surface. 12/30/2022: The wound is about the same size, but substantially cleaner. There is some hypertrophic granulation tissue starting to emerge. The wound that was new last week has closed but she has a different new wound today on the lateral aspect of her left lower leg. It also appears to have been a blister that opened and ruptured. There is a little bit of slough on the surface. 01/06/2023: She has new wounds today. She  has a wound on the PIP knuckle of her left third and fourth toes, as well as an anterior tibial wound. She says that the wrap was too tight and it also looks like when she pushes her foot into her shoe, it shifts the wrapping back to her midfoot causing bunching up of the wrap material and pain. The lateral leg wounds are both smaller with a little slough and eschar present. The original medial leg wound is smaller and more superficial. There is slough on the surface. 01/13/2023: She has new wounds today. The anterior tibial wound has a satellite lesion that has opened up adjacent to it. Has additional wounds on her fifth toe in addition to the existing ones on her third and fourth toe. The lateral leg wounds seem to have healed. The original medial leg wound is also smaller, but she and her daughter have several  questions regarding why we are not making forward progress, all of which are very reasonable. 01/20/2023: The wounds on her toes have healed. There is a tiny wound on the dorsum of her foot with a little bit of slough on it. The anterolateral leg wound has epithelialized quite substantially and the satellite that had opened last week is now closed. The medial leg wound measured narrower today. It has hypertrophic granulation tissue and slough present. Patient History Family History Cancer - Siblings, Lung Disease - Siblings, No family history of Diabetes, Heart Disease, Hereditary Spherocytosis, Hypertension, Kidney Disease, Seizures, Stroke, Thyroid Problems, Tuberculosis. Social History Never smoker, Marital Status - Widowed, Alcohol Use - Never, Drug Use - No History, Caffeine Use - Moderate. Medical History Eyes Patient has history of Cataracts Cardiovascular Patient has history of Arrhythmia - bundle branch block, Hypertension, Peripheral Venous Disease Musculoskeletal Patient has history of Gout, Osteoarthritis Hospitalization/Surgery History - Salpingoophorectomy. - Colon resection. - Appendectomy. - Cystoscopy w/ retrogrades. - Joint replacement. - Mastectomy right. - gastric ulcer repair. - Abdominal hysterectomy. Medical A Surgical History Notes nd Hematologic/Lymphatic thrombocytopenia Gastrointestinal diverticulosis, GERD Endocrine hypothyroidism Genitourinary CKD stage III Musculoskeletal DJD, osteoporosis Oncologic Erin [name], Erin [name] ([id number]) [id number] Page 8 of 12 breast cancer Objective Constitutional Hypertensive, asymptomatic. no acute distress. Vitals Time Taken: 2:56 PM, Height: 60 in, Weight: 138 lbs, BMI: 26.9, Temperature: 98.3 F, Pulse: 75 bpm, Respiratory Rate: 18 breaths/min, Blood Pressure: 216/86 mmHg. Respiratory Normal work of breathing on room air. General Notes: 01/20/2023: The wounds on her toes have healed. There  is a tiny wound on the dorsum of her foot with a little bit of slough on it. The anterolateral leg wound has epithelialized quite substantially and the satellite that had opened last week is now closed. The medial leg wound measured narrower today. It has hypertrophic granulation tissue and slough present. Integumentary (Hair, Skin) Wound #1 status is Open. Original cause of wound was Gradually Appeared. The date acquired was: 08/29/2022. The wound has been in treatment 7 weeks. The wound is located on the Left,Medial Lower Leg. The wound measures 3.3cm length x 1.8cm width x 0.1cm depth; 4.665cm^2 area and 0.467cm^3 volume. There is Fat Layer (Subcutaneous Tissue) exposed. There is no tunneling or undermining noted. There is a medium amount of serosanguineous drainage noted. The wound margin is distinct with the outline attached to the wound base. There is small (1-33%) red granulation within the wound bed. There is a large (67-100%) amount of necrotic tissue within the wound bed including Adherent Slough. The periwound skin appearance had no abnormalities noted for texture. The periwound skin appearance  had no abnormalities noted for moisture. The periwound skin appearance had no abnormalities noted for color. Periwound temperature was noted as No Abnormality. The periwound has tenderness on palpation. Wound #4 status is Open. Original cause of wound was Gradually Appeared. The date acquired was: 01/06/2023. The wound has been in treatment 2 weeks. The wound is located on the Left,Anterior Lower Leg. The wound measures 0.5cm length x 0.6cm width x 0.1cm depth; 0.236cm^2 area and 0.024cm^3 volume. There is Fat Layer (Subcutaneous Tissue) exposed. There is no tunneling or undermining noted. There is a medium amount of serosanguineous drainage noted. The wound margin is distinct with the outline attached to the wound base. There is medium (34-66%) red granulation within the wound bed. There is a  medium (34-66%) amount of necrotic tissue within the wound bed including Adherent Slough. The periwound skin appearance had no abnormalities noted for texture. The periwound skin appearance had no abnormalities noted for moisture. The periwound skin appearance exhibited: Hemosiderin Staining. Periwound temperature was noted as No Abnormality. The periwound has tenderness on palpation. Wound #5 status is Healed - Epithelialized. Original cause of wound was Gradually Appeared. The date acquired was: 01/06/2023. The wound has been in treatment 2 weeks. The wound is located on the Left T Third. The wound measures 0cm length x 0cm width x 0cm depth; 0cm^2 area and 0cm^3 volume. oe There is no tunneling or undermining noted. There is a none present amount of drainage noted. There is no granulation within the wound bed. There is no necrotic tissue within the wound bed. The periwound skin appearance had no abnormalities noted for texture. The periwound skin appearance had no abnormalities noted for moisture. The periwound skin appearance had no abnormalities noted for color. Periwound temperature was noted as No Abnormality. Wound #6 status is Healed - Epithelialized. Original cause of wound was Gradually Appeared. The date acquired was: 01/06/2023. The wound has been in treatment 2 weeks. The wound is located on the Left T Fourth. The wound measures 0cm length x 0cm width x 0cm depth; 0cm^2 area and 0cm^3 volume. oe There is no tunneling or undermining noted. There is a none present amount of drainage noted. There is no granulation within the wound bed. There is no necrotic tissue within the wound bed. The periwound skin appearance had no abnormalities noted for texture. The periwound skin appearance had no abnormalities noted for moisture. The periwound skin appearance had no abnormalities noted for color. Periwound temperature was noted as No Abnormality. Wound #7 status is Open. Original cause of wound was  Gradually Appeared. The date acquired was: 01/13/2023. The wound has been in treatment 1 weeks. The wound is located on the Left,Distal,Medial Lower Leg. The wound measures 0.3cm length x 0.3cm width x 0.1cm depth; 0.071cm^2 area and 0.007cm^3 volume. There is Fat Layer (Subcutaneous Tissue) exposed. There is no tunneling or undermining noted. There is a small amount of serous drainage noted. The wound margin is distinct with the outline attached to the wound base. There is large (67-100%) red granulation within the wound bed. There is no necrotic tissue within the wound bed. The periwound skin appearance had no abnormalities noted for texture. The periwound skin appearance had no abnormalities noted for moisture. The periwound skin appearance had no abnormalities noted for color. Periwound temperature was noted as No Abnormality. Wound #8 status is Healed - Epithelialized. Original cause of wound was Gradually Appeared. The date acquired was: 01/13/2023. The wound has been in treatment 1 weeks. The wound  is located on the Left T Fifth. The wound measures 0cm length x 0cm width x 0cm depth; 0cm^2 area and 0cm^3 volume. oe There is no tunneling or undermining noted. There is a none present amount of drainage noted. There is no granulation within the wound bed. There is no necrotic tissue within the wound bed. The periwound skin appearance had no abnormalities noted for texture. The periwound skin appearance had no abnormalities noted for moisture. The periwound skin appearance had no abnormalities noted for color. Periwound temperature was noted as No Abnormality. Wound #9 status is Open. Original cause of wound was Gradually Appeared. The date acquired was: 01/13/2023. The wound has been in treatment 1 weeks. The wound is located on the Left,Dorsal Foot. The wound measures 0.1cm length x 0.1cm width x 0.1cm depth; 0.008cm^2 area and 0.001cm^3 volume. There is Fat Layer (Subcutaneous Tissue) exposed. There is  no tunneling or undermining noted. There is a small amount of serous drainage noted. The wound margin is distinct with the outline attached to the wound base. There is large (67-100%) red granulation within the wound bed. There is no necrotic tissue within the wound bed. The periwound skin appearance had no abnormalities noted for texture. The periwound skin appearance had no abnormalities noted for moisture. The periwound skin appearance had no abnormalities noted for color. Periwound temperature was noted as No Abnormality. Assessment Active Problems ICD-10 Non-pressure chronic ulcer of other part of left lower leg with fat layer exposed Chronic venous hypertension (idiopathic) with ulcer and inflammation of left lower extremity Essential (primary) hypertension Non-pressure chronic ulcer of other part of left foot with fat layer exposed Erin [name], Erin [name] ([id number]) [id number] Page 9 of 12 Procedures Wound #1 Pre-procedure diagnosis of Wound #1 is a Venous Leg Ulcer located on the Left,Medial Lower Leg .Severity of Tissue Pre Debridement is: Fat layer exposed. There was a Selective/Open Wound Non-Viable Tissue Debridement with a total area of 4.66 sq cm performed by Duanne Guess, MD. With the following instrument(s): Curette to remove Non-Viable tissue/material. Material removed includes [facility] after achieving pain control using Lidocaine 4% Topical Solution. No specimens were taken. A time out was conducted at 15:30, prior to the start of the procedure. A Minimum amount of bleeding was controlled with Pressure. The procedure was tolerated well with a pain level of 0 throughout and a pain level of 0 following the procedure. Post Debridement Measurements: 3.3cm length x 1.8cm width x 0.1cm depth; 0.467cm^3 volume. Character of Wound/Ulcer Post Debridement is improved. Severity of Tissue Post Debridement is: Fat layer exposed. Post procedure Diagnosis Wound #1:  Same as Pre-Procedure General Notes: Scribed for Dr Lady Gary by Zenaida Deed, RN. Wound #4 Pre-procedure diagnosis of Wound #4 is a Venous Leg Ulcer located on the Left,Anterior Lower Leg .Severity of Tissue Pre Debridement is: Fat layer exposed. There was a Selective/Open Wound Non-Viable Tissue Debridement with a total area of 0.24 sq cm performed by Duanne Guess, MD. With the following instrument(s): Curette to remove Non-Viable tissue/material. Material removed includes [facility] after achieving pain control using Lidocaine 4% Topical Solution. No specimens were taken. A time out was conducted at 15:30, prior to the start of the procedure. A Minimum amount of bleeding was controlled with Pressure. The procedure was tolerated well with a pain level of 0 throughout and a pain level of 0 following the procedure. Post Debridement Measurements: 0.5cm length x 0.6cm width x 0.1cm depth; 0.024cm^3 volume. Character of Wound/Ulcer Post Debridement is improved. Severity of Tissue Post  Debridement is: Fat layer exposed. Post procedure Diagnosis Wound #4: Same as Pre-Procedure General Notes: Scribed for Dr Lady Gary by Zenaida Deed, RN. Plan Follow-up Appointments: Return Appointment in 1 week. - Dr. Lady Gary - room 2 Anesthetic: (In clinic) Topical Lidocaine 4% applied to wound bed Bathing/ Shower/ Hygiene: May shower with protection but do not get wound dressing(s) wet. Protect dressing(s) with water repellant cover (for example, large plastic bag) or a cast cover and may then take shower. Edema Control - Lymphedema / SCD / Other: Elevate legs to the level of the heart or above for 30 minutes daily and/or when sitting for 3-4 times a day throughout the day. Avoid standing for long periods of time. Exercise regularly WOUND #1: - Lower Leg Wound Laterality: Left, Medial Cleanser: Soap and Water 1 x Per Week/30 Days Discharge Instructions: May shower and wash wound with dial antibacterial soap and  water prior to dressing change. Cleanser: Wound Cleanser 1 x Per Week/30 Days Discharge Instructions: Cleanse the wound with wound cleanser prior to applying a clean dressing using gauze sponges, not tissue or cotton balls. Peri-Wound Care: Sween Lotion (Moisturizing lotion) 1 x Per Week/30 Days Discharge Instructions: Apply moisturizing lotion as directed Topical: Gentamicin 1 x Per Week/30 Days Discharge Instructions: As directed by physician Topical: Mupirocin Ointment 1 x Per Week/30 Days Discharge Instructions: Apply Mupirocin (Bactroban) as instructed Prim Dressing: Maxorb Extra Ag+ Alginate Dressing, 2x2 (in/in) 1 x Per Week/30 Days ary Discharge Instructions: Apply to wound bed as instructed Secondary Dressing: ABD Pad, 5x9 1 x Per Week/30 Days Discharge Instructions: Apply over primary dressing as directed. Secondary Dressing: Woven Gauze Sponge, Non-Sterile 4x4 in 1 x Per Week/30 Days Discharge Instructions: Apply over primary dressing as directed. Secured With: Elastic Bandage 4 inch (ACE bandage) 1 x Per Week/30 Days Discharge Instructions: Secure with ACE bandage as directed. Secured With: [employer], 4.5x3.1 (in/yd) 1 x Per Week/30 Days Discharge Instructions: Secure with Kerlix as directed. WOUND #4: - Lower Leg Wound Laterality: Left, Anterior Cleanser: Soap and Water 1 x Per Week/30 Days Discharge Instructions: May shower and wash wound with dial antibacterial soap and water prior to dressing change. Cleanser: Wound Cleanser 1 x Per Week/30 Days Discharge Instructions: Cleanse the wound with wound cleanser prior to applying a clean dressing using gauze sponges, not tissue or cotton balls. Peri-Wound Care: Sween Lotion (Moisturizing lotion) 1 x Per Week/30 Days Discharge Instructions: Apply moisturizing lotion as directed Topical: Gentamicin 1 x Per Week/30 Days Discharge Instructions: As directed by physician Topical: Mupirocin Ointment 1 x Per Week/30  Days Discharge Instructions: Apply Mupirocin (Bactroban) as instructed Prim Dressing: Maxorb Extra Ag+ Alginate Dressing, 2x2 (in/in) 1 x Per Week/30 Days ary Discharge Instructions: Apply to wound bed as instructed Secondary Dressing: ABD Pad, 5x9 1 x Per Week/30 Days Discharge Instructions: Apply over primary dressing as directed. Secondary Dressing: Woven Gauze Sponge, Non-Sterile 4x4 in 1 x Per Week/30 Days Discharge Instructions: Apply over primary dressing as directed. Secured With: Elastic Bandage 4 inch (ACE bandage) 1 x Per Week/30 Days Discharge Instructions: Secure with ACE bandage as directed. Secured With: [employer], 4.5x3.1 (in/yd) 1 x Per Week/30 Days Discharge Instructions: Secure with Kerlix as directed. WOUND #7: - Lower Leg Wound Laterality: Left, Medial, Distal Erin [name], Erin [name] ([id number]) [id number] Page 10 of 12 Cleanser: Soap and Water 1 x Per Week/30 Days Discharge Instructions: May shower and wash wound with dial antibacterial soap and water prior to dressing change. Cleanser: Wound Cleanser 1  x Per Week/30 Days Discharge Instructions: Cleanse the wound with wound cleanser prior to applying a clean dressing using gauze sponges, not tissue or cotton balls. Peri-Wound Care: Sween Lotion (Moisturizing lotion) 1 x Per Week/30 Days Discharge Instructions: Apply moisturizing lotion as directed Topical: Gentamicin 1 x Per Week/30 Days Discharge Instructions: As directed by physician Topical: Mupirocin Ointment 1 x Per Week/30 Days Discharge Instructions: Apply Mupirocin (Bactroban) as instructed Prim Dressing: Maxorb Extra Ag+ Alginate Dressing, 2x2 (in/in) 1 x Per Week/30 Days ary Discharge Instructions: Apply to wound bed as instructed Secondary Dressing: ABD Pad, 5x9 1 x Per Week/30 Days Discharge Instructions: Apply over primary dressing as directed. Secondary Dressing: Woven Gauze Sponge, Non-Sterile 4x4 in 1 x Per Week/30  Days Discharge Instructions: Apply over primary dressing as directed. Secured With: Elastic Bandage 4 inch (ACE bandage) 1 x Per Week/30 Days Discharge Instructions: Secure with ACE bandage as directed. Secured With: [employer], 4.5x3.1 (in/yd) 1 x Per Week/30 Days Discharge Instructions: Secure with Kerlix as directed. WOUND #9: - Foot Wound Laterality: Dorsal, Left Cleanser: Soap and Water Every Other Day/30 Days Discharge Instructions: May shower and wash wound with dial antibacterial soap and water prior to dressing change. Cleanser: Wound Cleanser Every Other Day/30 Days Discharge Instructions: Cleanse the wound with wound cleanser prior to applying a clean dressing using gauze sponges, not tissue or cotton balls. Peri-Wound Care: Sween Lotion (Moisturizing lotion) Every Other Day/30 Days Discharge Instructions: Apply moisturizing lotion as directed Topical: Gentamicin Every Other Day/30 Days Discharge Instructions: As directed by physician Topical: Mupirocin Ointment Every Other Day/30 Days Discharge Instructions: Apply Mupirocin (Bactroban) as instructed Prim Dressing: Maxorb Extra Ag+ Alginate Dressing, 2x2 (in/in) (Generic) Every Other Day/30 Days ary Discharge Instructions: Apply to wound bed as instructed Secondary Dressing: Woven Gauze Sponge, Non-Sterile 4x4 in (Generic) Every Other Day/30 Days Discharge Instructions: Apply over primary dressing as directed. Secured With: 59M Medipore H Soft Cloth Surgical T ape, 4 x 10 (in/yd) (Generic) Every Other Day/30 Days Discharge Instructions: Secure with tape as directed. 01/20/2023: The wounds on her toes have healed. There is a tiny wound on the dorsum of her foot with a little bit of slough on it. The anterolateral leg wound has epithelialized quite substantially and the satellite that had opened last week is now closed. The medial leg wound measured narrower today. It has hypertrophic granulation tissue and slough  present. I used a curette to debride slough from all of the open wound surfaces. I then chemically cauterized the hypertrophic granulation tissue on her medial lower leg with silver nitrate. We will continue to apply a mixture of topical gentamicin and mupirocin with silver alginate, Kerlix and an Ace wrap. We are still waiting to hear from vascular for her appointment for arterial and venous studies. Follow-up in 1 week. Electronic Signature(s) Signed: 01/20/2023 3:50:11 PM By: Duanne Guess MD FACS Entered By: Duanne Guess on 01/20/2023 15:50:11 -------------------------------------------------------------------------------- HxROS Details Patient Name: Date of Service: [name], [name] 01/20/2023 2:45 PM Medical Record Number: [id number] Patient Account Number: [id number] Date of Birth/Sex: Treating RN: 03/10/1941 (82 y.o. [name] Primary Care Provider: [name] Other Clinician: Referring Provider: Treating Provider/Extender: [name] Weeks in Treatment: 7 Eyes Medical History: Positive for: Cataracts Hematologic/Lymphatic Medical History: Past Medical History Notes: thrombocytopenia Erin [name], Erin [name] ([id number]) [id number] Page 11 of 12 Cardiovascular Medical History: Positive for: Arrhythmia - bundle branch block; Hypertension; Peripheral Venous Disease Gastrointestinal Medical History: Past Medical  History Notes: diverticulosis, GERD Endocrine Medical History: Past Medical History Notes: hypothyroidism Genitourinary Medical History: Past Medical History Notes: CKD stage III Musculoskeletal Medical History: Positive for: Gout; Osteoarthritis Past Medical History Notes: DJD, osteoporosis Oncologic Medical History: Past Medical History Notes: breast cancer HBO Extended History Items Eyes: Cataracts Immunizations Pneumococcal Vaccine: Received Pneumococcal Vaccination:  Yes Received Pneumococcal Vaccination On or After 60th Birthday: No Implantable Devices No devices added Hospitalization / Surgery History Type of Hospitalization/Surgery Salpingoophorectomy Colon resection Appendectomy Cystoscopy w/ retrogrades Joint replacement Mastectomy right gastric ulcer repair Abdominal hysterectomy Family and Social History Cancer: Yes - Siblings; Diabetes: No; Heart Disease: No; Hereditary Spherocytosis: No; Hypertension: No; Kidney Disease: No; Lung Disease: Yes - Siblings; Seizures: No; Stroke: No; Thyroid Problems: No; Tuberculosis: No; Never smoker; Marital Status - Widowed; Alcohol Use: Never; Drug Use: No History; Caffeine Use: Moderate; Financial Concerns: No; Food, Clothing or Shelter Needs: No; Support System Lacking: No; Transportation Concerns: No Psychologist, prison and probation services) Signed: 01/20/2023 3:51:58 PM By: Duanne Guess MD FACS Signed: 01/20/2023 4:38:18 PM By: Zenaida Deed RN, BSN Entered By: Duanne Guess on 01/20/2023 15:43:12 Erin [name] ([id number]) [id number] Page 12 of 12 -------------------------------------------------------------------------------- SuperBill Details Patient Name: Date of Service: [name], [name] 01/20/2023 Medical Record Number: [id number] Patient Account Number: [id number] Date of Birth/Sex: Treating RN: Sep 13, 1940 (82 y.o. [name] Primary Care Provider: [name] Other Clinician: Referring Provider: Treating Provider/Extender: [name] Weeks in Treatment: 7 Diagnosis Coding ICD-10 Codes Code Description [phone] Non-pressure chronic ulcer of other part of left lower leg with fat layer exposed I87.332 Chronic venous hypertension (idiopathic) with ulcer and inflammation of left lower extremity I10 Essential (primary) hypertension L97.522 Non-pressure chronic ulcer of other part of left foot with fat layer exposed Facility  Procedures : CPT4 Code: [id number] Description: 97597 - DEBRIDE WOUND 1ST 20 SQ CM OR < ICD-10 Diagnosis Description L97.822 Non-pressure chronic ulcer of other part of left lower leg with fat layer exposed Modifier: Quantity: 1 Physician Procedures : CPT4 Code Description Modifier [id number] - WC PHYS LEVEL 4 - EST PT 25 ICD-10 Diagnosis Description L97.822 Non-pressure chronic ulcer of other part of left lower leg with fat layer exposed I87.332 Chronic venous hypertension (idiopathic) with  ulcer and inflammation of left lower extremity I10 Essential (primary) hypertension Quantity: 1 : [id number] 97597 - WC PHYS DEBR WO ANESTH 20 SQ CM ICD-10 Diagnosis Description L97.822 Non-pressure chronic ulcer of other part of left lower leg with fat layer exposed Quantity: 1 Electronic Signature(s) Signed: 01/20/2023 3:50:50 PM By: Duanne Guess MD FACS Entered By: Duanne Guess on 01/20/2023 15:50:49

## 2023-01-27 ENCOUNTER — Other Ambulatory Visit (HOSPITAL_COMMUNITY): Payer: Self-pay | Admitting: General Surgery

## 2023-01-27 ENCOUNTER — Encounter (HOSPITAL_BASED_OUTPATIENT_CLINIC_OR_DEPARTMENT_OTHER): Payer: Medicare HMO | Admitting: General Surgery

## 2023-01-27 ENCOUNTER — Encounter (HOSPITAL_COMMUNITY): Payer: Self-pay | Admitting: General Surgery

## 2023-01-27 DIAGNOSIS — N183 Chronic kidney disease, stage 3 unspecified: Secondary | ICD-10-CM | POA: Diagnosis not present

## 2023-01-27 DIAGNOSIS — L97822 Non-pressure chronic ulcer of other part of left lower leg with fat layer exposed: Secondary | ICD-10-CM | POA: Diagnosis not present

## 2023-01-27 DIAGNOSIS — E039 Hypothyroidism, unspecified: Secondary | ICD-10-CM | POA: Diagnosis not present

## 2023-01-27 DIAGNOSIS — I129 Hypertensive chronic kidney disease with stage 1 through stage 4 chronic kidney disease, or unspecified chronic kidney disease: Secondary | ICD-10-CM | POA: Diagnosis not present

## 2023-01-27 DIAGNOSIS — I890 Lymphedema, not elsewhere classified: Secondary | ICD-10-CM | POA: Diagnosis not present

## 2023-01-27 DIAGNOSIS — I872 Venous insufficiency (chronic) (peripheral): Secondary | ICD-10-CM | POA: Diagnosis not present

## 2023-01-27 DIAGNOSIS — L97522 Non-pressure chronic ulcer of other part of left foot with fat layer exposed: Secondary | ICD-10-CM | POA: Diagnosis not present

## 2023-01-27 DIAGNOSIS — S91302A Unspecified open wound, left foot, initial encounter: Secondary | ICD-10-CM

## 2023-01-27 DIAGNOSIS — M109 Gout, unspecified: Secondary | ICD-10-CM | POA: Diagnosis not present

## 2023-01-27 DIAGNOSIS — I87332 Chronic venous hypertension (idiopathic) with ulcer and inflammation of left lower extremity: Secondary | ICD-10-CM | POA: Diagnosis not present

## 2023-01-27 NOTE — Progress Notes (Signed)
Erin [name], Erin [name] ([id number]) [id number] Page 1 of 10 Visit Report for 01/27/2023 Chief Complaint Document Details Patient Name: Date of Service: [name], [name] RBA RA N. 01/27/2023 2:45 PM Medical Record Number: [id number] Patient Account Number: [id number] Date of Birth/Sex: Treating RN: [birth year] (82 y.o. [name] Primary Care Provider: [name] Other Clinician: Referring Provider: Treating Provider/Extender: [name] Weeks in Treatment: 8 Information Obtained from: Patient Chief Complaint Patient presents for treatment of an open ulcer due to venous insufficiency Electronic Signature(s) Signed: 01/27/2023 4:09:31 PM By: Duanne Guess MD FACS Entered By: Duanne Guess on 01/27/2023 16:09:31 -------------------------------------------------------------------------------- Debridement Details Patient Name: Date of Service: [name], [name] 01/27/2023 2:45 PM Medical Record Number: [id number] Patient Account Number: [id number] Date of Birth/Sex: Treating RN: 1941/06/12 (82 y.o. [name] Primary Care Provider: [name] Other Clinician: Referring Provider: Treating Provider/Extender: [name] Weeks in Treatment: 8 Debridement Performed for Assessment: Wound #1 Left,Medial Lower Leg Performed By: Physician Duanne Guess, MD Debridement Type: Debridement Severity of Tissue Pre Debridement: Fat layer exposed Level of Consciousness (Pre-procedure): Awake and Alert Pre-procedure Verification/Time Out Yes - 15:35 Taken: Start Time: 15:35 Pain Control: Lidocaine 4% T opical Solution Percent of Wound Bed Debrided: 100% T Area Debrided (cm): otal 4.14 Tissue and other material debrided: Non-Viable, Slough, Slough Level: Non-Viable Tissue Debridement Description: Selective/Open Wound Instrument: Curette Bleeding: Minimum Hemostasis Achieved:  Pressure Procedural Pain: 0 Post Procedural Pain: 0 Response to Treatment: Procedure was tolerated well Level of Consciousness (Post- Awake and Alert procedure): Post Debridement Measurements of Total Wound Length: (cm) 3.1 Width: (cm) 1.7 Depth: (cm) 0.1 Volume: (cm) 0.414 Character of Wound/Ulcer Post Debridement: Improved Severity of Tissue Post Debridement: Fat layer exposed Erin [name], Erin [name] ([id number]) [id number] Page 2 of 10 Post Procedure Diagnosis Same as Pre-procedure Notes Scribed for Dr Lady Gary by Zenaida Deed, RN Electronic Signature(s) Signed: 01/27/2023 4:18:25 PM By: Duanne Guess MD FACS Signed: 01/27/2023 5:07:22 PM By: Zenaida Deed RN, BSN Entered By: Zenaida Deed on 01/27/2023 15:37:00 -------------------------------------------------------------------------------- HPI Details Patient Name: Date of Service: [name], [name] 01/27/2023 2:45 PM Medical Record Number: [id number] Patient Account Number: [id number] Date of Birth/Sex: Treating RN: 1940/09/10 (82 y.o. [name] Primary Care Provider: [name] Other Clinician: Referring Provider: Treating Provider/Extender: [name] Weeks in Treatment: 8 History of Present Illness HPI Description: ADMISSION 11/26/2022 This is an 82 year old woman who was referred by her primary care provider for a persistent nonhealing wound on her left lower extremity. It has been present since February of this year. 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 I do not see any formal venous reflux studies in the electronic medical record. She is not diabetic. She does not smoke. Her PCP has been washing her leg each week and applying a Kerlix and Coban wrap, but has not performed any formal debridement and has not been using any sort of topical contact  layer or ointment. ABI in clinic was noncompressible, but she has a palpable pedal pulse. 12/10/2022: The wound is a bit smaller and cleaner today. There is still fairly heavy slough on the surface. Edema control is acceptable. 12/16/2022: The wound is measuring the slightest bit smaller today. There is still thick slough on the surface. Edema control is good. 12/23/2022: The wound is a little bit smaller again  today. Still with fairly heavy slough accumulation. Edema control is good. She has a new small wound on the proximal portion of the same leg. It looks as though she may have developed a small blister over the weekend subsequently ruptured. There is minimal slough on the surface. 12/30/2022: The wound is about the same size, but substantially cleaner. There is some hypertrophic granulation tissue starting to emerge. The wound that was new last week has closed but she has a different new wound today on the lateral aspect of her left lower leg. It also appears to have been a blister that opened and ruptured. There is a little bit of slough on the surface. 01/06/2023: She has new wounds today. She has a wound on the PIP knuckle of her left third and fourth toes, as well as an anterior tibial wound. She says that the wrap was too tight and it also looks like when she pushes her foot into her shoe, it shifts the wrapping back to her midfoot causing bunching up of the wrap material and pain. The lateral leg wounds are both smaller with a little slough and eschar present. The original medial leg wound is smaller and more superficial. There is slough on the surface. 01/13/2023: She has new wounds today. The anterior tibial wound has a satellite lesion that has opened up adjacent to it. Has additional wounds on her fifth toe in addition to the existing ones on her third and fourth toe. The lateral leg wounds seem to have healed. The original medial leg wound is also smaller, but she and her daughter have several  questions regarding why we are not making forward progress, all of which are very reasonable. 01/20/2023: The wounds on her toes have healed. There is a tiny wound on the dorsum of her foot with a little bit of slough on it. The anterolateral leg wound has epithelialized quite substantially and the satellite that had opened last week is now closed. The medial leg wound measured narrower today. It has hypertrophic granulation tissue and slough present. 01/27/2023: The small wounds have all healed. The medial leg wound is smaller as well, with some slough on the surface but without reaccumulation of the hypertrophic granulation tissue. Electronic Signature(s) Signed: 01/27/2023 4:10:23 PM By: Duanne Guess MD FACS Entered By: Duanne Guess on 01/27/2023 16:10:23 Erin [name] ([id number]) [id number] Page 3 of 10 -------------------------------------------------------------------------------- Physical Exam Details Patient Name: Date of Service: [name], [name] 01/27/2023 2:45 PM Medical Record Number: [id number] Patient Account Number: [id number] Date of Birth/Sex: Treating RN: [birth year] (82 y.o. [name] Primary Care Provider: [name] Other Clinician: Referring Provider: Treating Provider/Extender: [name], [name] in Treatment: 8 Constitutional Hypertensive, asymptomatic. . . . no acute distress. Respiratory Normal work of breathing on room air. Notes 01/27/2023: The small wounds have all healed. The medial leg wound is smaller as well, with some slough on the surface but without reaccumulation of the hypertrophic granulation tissue. Electronic Signature(s) Signed: 01/27/2023 4:10:53 PM By: Duanne Guess MD FACS Entered By: Duanne Guess on 01/27/2023 16:10:53 -------------------------------------------------------------------------------- Physician Orders Details Patient Name: Date of  Service: [name], [name] 01/27/2023 2:45 PM Medical Record Number: [id number] Patient Account Number: [id number] Date of Birth/Sex: Treating RN: 08/26/1940 (82 y.o. [name] Primary Care Provider: [name] Other Clinician: Referring Provider: Treating Provider/Extender: [name] Weeks in Treatment: 8 Verbal / Phone Orders: No Diagnosis Coding ICD-10  Coding Code Description [id number] Non-pressure chronic ulcer of other part of left lower leg with fat layer exposed I87.332 Chronic venous hypertension (idiopathic) with ulcer and inflammation of left lower extremity I10 Essential (primary) hypertension L97.522 Non-pressure chronic ulcer of other part of left foot with fat layer exposed Follow-up Appointments ppointment in 1 week. - Dr. Lady Gary - room 1 Return A Tuesday 7/30 @ 2:00 pm Anesthetic (In clinic) Topical Lidocaine 4% applied to wound bed Bathing/ Shower/ Hygiene May shower with protection but do not get wound dressing(s) wet. Protect dressing(s) with water repellant cover (for example, large plastic bag) or a cast cover and may then take shower. Edema Control - Lymphedema / SCD / Other Elevate legs to the level of the heart or above for 30 minutes daily and/or when sitting for 3-4 times a day throughout the day. Avoid standing for long periods of time. Exercise regularly Wound Treatment Wound #1 - Lower Leg Wound Laterality: Left, Medial Cleanser: Soap and Water 1 x Per Week/30 Days Discharge Instructions: May shower and wash wound with dial antibacterial soap and water prior to dressing change. Cleanser: Wound Cleanser 1 x Per Week/30 Days Discharge Instructions: Cleanse the wound with wound cleanser prior to applying a clean dressing using gauze sponges, not tissue or cotton balls. YALEXA, BLUST ([id number]) [id number] Page 4 of 10 Peri-Wound Care: Sween Lotion (Moisturizing lotion) 1 x Per  Week/30 Days Discharge Instructions: Apply moisturizing lotion as directed Topical: Gentamicin 1 x Per Week/30 Days Discharge Instructions: As directed by physician Topical: Mupirocin Ointment 1 x Per Week/30 Days Discharge Instructions: Apply Mupirocin (Bactroban) as instructed Prim Dressing: Maxorb Extra Ag+ Alginate Dressing, 2x2 (in/in) 1 x Per Week/30 Days ary Discharge Instructions: Apply to wound bed as instructed Secondary Dressing: ABD Pad, 5x9 1 x Per Week/30 Days Discharge Instructions: Apply over primary dressing as directed. Secondary Dressing: Woven Gauze Sponge, Non-Sterile 4x4 in 1 x Per Week/30 Days Discharge Instructions: Apply over primary dressing as directed. Secured With: Elastic Bandage 4 inch (ACE bandage) 1 x Per Week/30 Days Discharge Instructions: Secure with ACE bandage as directed. Secured With: [employer], 4.5x3.1 (in/yd) 1 x Per Week/30 Days Discharge Instructions: Secure with Kerlix as directed. Wound #4 - Lower Leg Wound Laterality: Left, Anterior Cleanser: Soap and Water 1 x Per Week/30 Days Discharge Instructions: May shower and wash wound with dial antibacterial soap and water prior to dressing change. Cleanser: Wound Cleanser 1 x Per Week/30 Days Discharge Instructions: Cleanse the wound with wound cleanser prior to applying a clean dressing using gauze sponges, not tissue or cotton balls. Peri-Wound Care: Sween Lotion (Moisturizing lotion) 1 x Per Week/30 Days Discharge Instructions: Apply moisturizing lotion as directed Topical: Gentamicin 1 x Per Week/30 Days Discharge Instructions: As directed by physician Topical: Mupirocin Ointment 1 x Per Week/30 Days Discharge Instructions: Apply Mupirocin (Bactroban) as instructed Prim Dressing: Maxorb Extra Ag+ Alginate Dressing, 2x2 (in/in) 1 x Per Week/30 Days ary Discharge Instructions: Apply to wound bed as instructed Secondary Dressing: ABD Pad, 5x9 1 x Per Week/30 Days Discharge  Instructions: Apply over primary dressing as directed. Secondary Dressing: Woven Gauze Sponge, Non-Sterile 4x4 in 1 x Per Week/30 Days Discharge Instructions: Apply over primary dressing as directed. Secured With: Elastic Bandage 4 inch (ACE bandage) 1 x Per Week/30 Days Discharge Instructions: Secure with ACE bandage as directed. Secured With: [employer], 4.5x3.1 (in/yd) 1 x Per Week/30 Days Discharge Instructions: Secure with Kerlix as directed. Electronic Signature(s) Signed: 01/27/2023 4:18:25 PM By:  Duanne Guess MD FACS Entered By: Duanne Guess on 01/27/2023 16:11:05 -------------------------------------------------------------------------------- Problem List Details Patient Name: Date of Service: [name], [name] RBA RA N. 01/27/2023 2:45 PM Medical Record Number: [id number] Patient Account Number: [id number] Date of Birth/Sex: Treating RN: 04-11-1941 (82 y.o. [name] Primary Care Provider: [name] Other Clinician: Referring Provider: Treating Provider/Extender: Erin [name], Erin [name] ([id number]) [id number] Page 5 of 10 Weeks in Treatment: 8 Active Problems ICD-10 Encounter Code Description Active Date MDM Diagnosis L97.822 Non-pressure chronic ulcer of other part of left lower leg with fat layer exposed5/22/2024 No Yes I87.332 Chronic venous hypertension (idiopathic) with ulcer and inflammation of left 11/26/2022 No Yes lower extremity I10 Essential (primary) hypertension 11/26/2022 No Yes Inactive Problems Resolved Problems ICD-10 Code Description Active Date Resolved Date L97.522 Non-pressure chronic ulcer of other part of left foot with fat layer exposed 01/13/2023 01/13/2023 Electronic Signature(s) Signed: 01/27/2023 4:04:12 PM By: Duanne Guess MD FACS Entered By: Duanne Guess on 01/27/2023  16:04:12 -------------------------------------------------------------------------------- Progress Note Details Patient Name: Date of Service: [name], [name] 01/27/2023 2:45 PM Medical Record Number: [id number] Patient Account Number: [id number] Date of Birth/Sex: Treating RN: 02-07-1941 (82 y.o. [name] Primary Care Provider: [name] Other Clinician: Referring Provider: Treating Provider/Extender: [name] Weeks in Treatment: 8 Subjective Chief Complaint Information obtained from Patient Patient presents for treatment of an open ulcer due to venous insufficiency History of Present Illness (HPI) ADMISSION 11/26/2022 This is an 82 year old woman who was referred by her primary care provider for a persistent nonhealing wound on her left lower extremity. It has been present since February of this year. 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 I do not see any formal venous reflux studies in the electronic medical record. She is not diabetic. She does not smoke. Her PCP has been washing her leg each week and applying a Kerlix and Coban wrap, but has not performed any formal debridement and has not been using any sort of topical contact layer or ointment. ABI in clinic was noncompressible, but she has a palpable pedal pulse. 12/10/2022: The wound is a bit smaller and cleaner today. There is still fairly heavy slough on the surface. Edema control is acceptable. 12/16/2022: The wound is measuring the slightest bit smaller today. There is still thick slough on the surface. Edema control is good. 12/23/2022: The wound is a little bit smaller again today. Still with fairly heavy slough accumulation. Edema control is good. She has a new small wound on the proximal portion of the same leg. It looks as though she may have developed a small blister over the  weekend subsequently ruptured. There is minimal slough on the surface. 12/30/2022: The wound is about the same size, but substantially cleaner. There is some hypertrophic granulation tissue starting to emerge. The wound that was Erin [name], Erin [name] ([id number]) [phone].pdf Page 6 of 10 new last week has closed but she has a different new wound today on the lateral aspect of her left lower leg. It also appears to have been a blister that opened and ruptured. There is a little bit of slough on the surface. 01/06/2023: She has new wounds today. She has a wound on the PIP knuckle of her left third and fourth toes, as well as an anterior tibial wound. She says that the wrap was too tight and  it also looks like when she pushes her foot into her shoe, it shifts the wrapping back to her midfoot causing bunching up of the wrap material and pain. The lateral leg wounds are both smaller with a little slough and eschar present. The original medial leg wound is smaller and more superficial. There is slough on the surface. 01/13/2023: She has new wounds today. The anterior tibial wound has a satellite lesion that has opened up adjacent to it. Has additional wounds on her fifth toe in addition to the existing ones on her third and fourth toe. The lateral leg wounds seem to have healed. The original medial leg wound is also smaller, but she and her daughter have several questions regarding why we are not making forward progress, all of which are very reasonable. 01/20/2023: The wounds on her toes have healed. There is a tiny wound on the dorsum of her foot with a little bit of slough on it. The anterolateral leg wound has epithelialized quite substantially and the satellite that had opened last week is now closed. The medial leg wound measured narrower today. It has hypertrophic granulation tissue and slough present. 01/27/2023: The small wounds have all healed. The medial leg wound is smaller as  well, with some slough on the surface but without reaccumulation of the hypertrophic granulation tissue. Patient History Family History Cancer - Siblings, Lung Disease - Siblings, No family history of Diabetes, Heart Disease, Hereditary Spherocytosis, Hypertension, Kidney Disease, Seizures, Stroke, Thyroid Problems, Tuberculosis. Social History Never smoker, Marital Status - Widowed, Alcohol Use - Never, Drug Use - No History, Caffeine Use - Moderate. Medical History Eyes Patient has history of Cataracts Cardiovascular Patient has history of Arrhythmia - bundle branch block, Hypertension, Peripheral Venous Disease Musculoskeletal Patient has history of Gout, Osteoarthritis Hospitalization/Surgery History - Salpingoophorectomy. - Colon resection. - Appendectomy. - Cystoscopy w/ retrogrades. - Joint replacement. - Mastectomy right. - gastric ulcer repair. - Abdominal hysterectomy. Medical A Surgical History Notes nd Hematologic/Lymphatic thrombocytopenia Gastrointestinal diverticulosis, GERD Endocrine hypothyroidism Genitourinary CKD stage III Musculoskeletal DJD, osteoporosis Oncologic breast cancer Objective Constitutional Hypertensive, asymptomatic. no acute distress. Vitals Time Taken: 2:59 PM, Height: 60 in, Weight: 138 lbs, BMI: 26.9, Temperature: 97.9 F, Pulse: 71 bpm, Respiratory Rate: 18 breaths/min, Blood Pressure: 207/81 mmHg. Respiratory Normal work of breathing on room air. General Notes: 01/27/2023: The small wounds have all healed. The medial leg wound is smaller as well, with some slough on the surface but without reaccumulation of the hypertrophic granulation tissue. Integumentary (Hair, Skin) Wound #1 status is Open. Original cause of wound was Gradually Appeared. The date acquired was: 08/29/2022. The wound has been in treatment 8 weeks. The wound is located on the Left,Medial Lower Leg. The wound measures 3.1cm length x 1.7cm width x 0.1cm depth; 4.139cm^2  area and 0.414cm^3 volume. There is Fat Layer (Subcutaneous Tissue) exposed. There is no tunneling or undermining noted. There is a medium amount of serosanguineous drainage noted. The wound margin is distinct with the outline attached to the wound base. There is small (1-33%) red granulation within the wound bed. There is a large (67-100%) amount of necrotic tissue within the wound bed including Adherent Slough. The periwound skin appearance had no abnormalities noted for texture. The periwound skin appearance had no abnormalities noted for moisture. The periwound skin appearance had no abnormalities noted for color. Periwound temperature was noted as No Abnormality. The periwound has tenderness on palpation. Wound #4 status is Open. Original cause of wound was Gradually Appeared. The  date acquired was: 01/06/2023. The wound has been in treatment 3 weeks. The wound is located on the Left,Anterior Lower Leg. The wound measures 0.1cm length x 0.1cm width x 0.1cm depth; 0.008cm^2 area and 0.001cm^3 volume. There is Fat Layer (Subcutaneous Tissue) exposed. There is no tunneling or undermining noted. There is a small amount of serous drainage noted. The wound Erin [name], Erin [name] ([id number]) [id number] Page 7 of 10 margin is distinct with the outline attached to the wound base. There is small (1-33%) red granulation within the wound bed. There is no necrotic tissue within the wound bed. The periwound skin appearance had no abnormalities noted for texture. The periwound skin appearance had no abnormalities noted for moisture. The periwound skin appearance exhibited: Hemosiderin Staining. Periwound temperature was noted as No Abnormality. Wound #7 status is Healed - Epithelialized. Original cause of wound was Gradually Appeared. The date acquired was: 01/13/2023. The wound has been in treatment 2 weeks. The wound is located on the Left,Distal,Medial Lower Leg. The wound measures 0cm  length x 0cm width x 0cm depth; 0cm^2 area and 0cm^3 volume. There is no tunneling or undermining noted. There is a none present amount of drainage noted. There is no granulation within the wound bed. There is no necrotic tissue within the wound bed. The periwound skin appearance had no abnormalities noted for texture. The periwound skin appearance had no abnormalities noted for moisture. The periwound skin appearance had no abnormalities noted for color. Periwound temperature was noted as No Abnormality. Wound #9 status is Healed - Epithelialized. Original cause of wound was Gradually Appeared. The date acquired was: 01/13/2023. The wound has been in treatment 2 weeks. The wound is located on the Left,Dorsal Foot. The wound measures 0cm length x 0cm width x 0cm depth; 0cm^2 area and 0cm^3 volume. There is no tunneling or undermining noted. There is a none present amount of drainage noted. There is no granulation within the wound bed. There is no necrotic tissue within the wound bed. The periwound skin appearance had no abnormalities noted for texture. The periwound skin appearance had no abnormalities noted for moisture. The periwound skin appearance had no abnormalities noted for color. Periwound temperature was noted as No Abnormality. 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 There was a Selective/Open Wound Non-Viable Tissue Debridement with a total area of 4.14 sq cm performed by Duanne Guess, MD. With the following instrument(s): Curette to remove Non-Viable tissue/material. Material removed includes [facility] after  achieving pain control using Lidocaine 4% Topical Solution. No specimens were taken. A time out was conducted at 15:35, prior to the start of the procedure. A Minimum amount of bleeding was controlled with Pressure. The procedure was tolerated well with a pain level of 0 throughout and a pain level of 0 following the procedure. Post Debridement Measurements: 3.1cm length x 1.7cm width x 0.1cm depth; 0.414cm^3 volume. Character of Wound/Ulcer Post Debridement is improved. Severity of Tissue Post Debridement is: Fat layer exposed. Post procedure Diagnosis Wound #1: Same as Pre-Procedure General Notes: Scribed for Dr Lady Gary by Zenaida Deed, RN. Plan Follow-up Appointments: Return Appointment in 1 week. - Dr. Lady Gary - room 1 Tuesday 7/30 @ 2:00 pm Anesthetic: (In  clinic) Topical Lidocaine 4% applied to wound bed Bathing/ Shower/ Hygiene: May shower with protection but do not get wound dressing(s) wet. Protect dressing(s) with water repellant cover (for example, large plastic bag) or a cast cover and may then take shower. Edema Control - Lymphedema / SCD / Other: Elevate legs to the level of the heart or above for 30 minutes daily and/or when sitting for 3-4 times a day throughout the day. Avoid standing for long periods of time. Exercise regularly WOUND #1: - Lower Leg Wound Laterality: Left, Medial Cleanser: Soap and Water 1 x Per Week/30 Days Discharge Instructions: May shower and wash wound with dial antibacterial soap and water prior to dressing change. Cleanser: Wound Cleanser 1 x Per Week/30 Days Discharge Instructions: Cleanse the wound with wound cleanser prior to applying a clean dressing using gauze sponges, not tissue or cotton balls. Peri-Wound Care: Sween Lotion (Moisturizing lotion) 1 x Per Week/30 Days Discharge Instructions: Apply moisturizing lotion as directed Topical: Gentamicin 1 x Per Week/30 Days Discharge Instructions: As directed by physician Topical: Mupirocin  Ointment 1 x Per Week/30 Days Discharge Instructions: Apply Mupirocin (Bactroban) as instructed Prim Dressing: Maxorb Extra Ag+ Alginate Dressing, 2x2 (in/in) 1 x Per Week/30 Days ary Discharge Instructions: Apply to wound bed as instructed Secondary Dressing: ABD Pad, 5x9 1 x Per Week/30 Days Discharge Instructions: Apply over primary dressing as directed. Secondary Dressing: Woven Gauze Sponge, Non-Sterile 4x4 in 1 x Per Week/30 Days Discharge Instructions: Apply over primary dressing as directed. Secured With: Elastic Bandage 4 inch (ACE bandage) 1 x Per Week/30 Days Discharge Instructions: Secure with ACE bandage as directed. Secured With: [employer], 4.5x3.1 (in/yd) 1 x Per Week/30 Days Discharge Instructions: Secure with Kerlix as directed. WOUND #4: - Lower Leg Wound Laterality: Left, Anterior Cleanser: Soap and Water 1 x Per Week/30 Days Discharge Instructions: May shower and wash wound with dial antibacterial soap and water prior to dressing change. Cleanser: Wound Cleanser 1 x Per Week/30 Days Erin [name], Erin [name] ([id number]) [phone].pdf Page 8 of 10 Discharge Instructions: Cleanse the wound with wound cleanser prior to applying a clean dressing using gauze sponges, not tissue or cotton balls. Peri-Wound Care: Sween Lotion (Moisturizing lotion) 1 x Per Week/30 Days Discharge Instructions: Apply moisturizing lotion as directed Topical: Gentamicin 1 x Per Week/30 Days Discharge Instructions: As directed by physician Topical: Mupirocin Ointment 1 x Per Week/30 Days Discharge Instructions: Apply Mupirocin (Bactroban) as instructed Prim Dressing: Maxorb Extra Ag+ Alginate Dressing, 2x2 (in/in) 1 x Per Week/30 Days ary Discharge Instructions: Apply to wound bed as instructed Secondary Dressing: ABD Pad, 5x9 1 x Per Week/30 Days Discharge Instructions: Apply over primary dressing as directed. Secondary Dressing: Woven Gauze Sponge, Non-Sterile 4x4 in  1 x Per Week/30 Days Discharge Instructions: Apply over primary dressing as directed. Secured With: Elastic Bandage 4 inch (ACE bandage) 1 x Per Week/30 Days Discharge Instructions: Secure with ACE bandage as directed. Secured With: [employer], 4.5x3.1 (in/yd) 1 x Per Week/30 Days Discharge Instructions: Secure with Kerlix as directed. 01/27/2023: The small wounds have all healed. The medial leg wound is smaller as well, with some slough on the surface but without reaccumulation of the hypertrophic granulation tissue. I used a curette to debride slough from the medial leg wound. We will continue the mixture of topical gentamicin and mupirocin with silver alginate, Kerlix and Ace bandage. She has appointments tomorrow to have arterial and venous studies done at the vascular lab. She will return here afterwards to  have her wrap reapplied. Follow-up with me in 1 week. Electronic Signature(s) Signed: 01/27/2023 4:12:23 PM By: Duanne Guess MD FACS Entered By: Duanne Guess on 01/27/2023 16:12:23 -------------------------------------------------------------------------------- HxROS Details Patient Name: Date of Service: [name], [name] 01/27/2023 2:45 PM Medical Record Number: [id number] Patient Account Number: [id number] Date of Birth/Sex: Treating RN: 07-01-1941 (82 y.o. [name] Primary Care Provider: [name] Other Clinician: Referring Provider: Treating Provider/Extender: [name] Weeks in Treatment: 8 Eyes Medical History: Positive for: Cataracts Hematologic/Lymphatic Medical History: Past Medical History Notes: thrombocytopenia Cardiovascular Medical History: Positive for: Arrhythmia - bundle branch block; Hypertension; Peripheral Venous Disease Gastrointestinal Medical History: Past Medical History Notes: diverticulosis, GERD Endocrine Medical History: Past Medical History  Notes: hypothyroidism [address] KHARTER, SESTAK ([id number]) [id number] Page 9 of 10 Medical History: Past Medical History Notes: CKD stage III Musculoskeletal Medical History: Positive for: Gout; Osteoarthritis Past Medical History Notes: DJD, osteoporosis Oncologic Medical History: Past Medical History Notes: breast cancer HBO Extended History Items Eyes: Cataracts Immunizations Pneumococcal Vaccine: Received Pneumococcal Vaccination: Yes Received Pneumococcal Vaccination On or After 60th Birthday: No Implantable Devices No devices added Hospitalization / Surgery History Type of Hospitalization/Surgery Salpingoophorectomy Colon resection Appendectomy Cystoscopy w/ retrogrades Joint replacement Mastectomy right gastric ulcer repair Abdominal hysterectomy Family and Social History Cancer: Yes - Siblings; Diabetes: No; Heart Disease: No; Hereditary Spherocytosis: No; Hypertension: No; Kidney Disease: No; Lung Disease: Yes - Siblings; Seizures: No; Stroke: No; Thyroid Problems: No; Tuberculosis: No; Never smoker; Marital Status - Widowed; Alcohol Use: Never; Drug Use: No History; Caffeine Use: Moderate; Financial Concerns: No; Food, Clothing or Shelter Needs: No; Support System Lacking: No; Transportation Concerns: No Psychologist, prison and probation services) Signed: 01/27/2023 4:18:25 PM By: Duanne Guess MD FACS Signed: 01/27/2023 5:07:22 PM By: Zenaida Deed RN, BSN Entered By: Duanne Guess on 01/27/2023 16:10:29 -------------------------------------------------------------------------------- SuperBill Details Patient Name: Date of Service: [name], [name] 01/27/2023 Medical Record Number: [id number] Patient Account Number: [id number] Date of Birth/Sex: Treating RN: January 01, 1941 (82 y.o. [name] Primary Care Provider: [name] Other Clinician: Referring Provider: Treating Provider/Extender: [name] Weeks in Treatment: 8 Diagnosis Coding ICD-10 Codes Code Description [phone] Non-pressure chronic ulcer of other part of left lower leg with fat layer exposed I87.332 Chronic venous hypertension (idiopathic) with ulcer and inflammation of left lower extremity Erin [name], Erin [name] ([id number]) [phone].pdf Page 10 of 10 I10 Essential (primary) hypertension Facility Procedures : CPT4 Code: [id number] Description: [id number] - DEBRIDE WOUND 1ST 20 SQ CM OR < ICD-10 Diagnosis Description L97.822 Non-pressure chronic ulcer of other part of left lower leg with fat layer exposed Modifier: Quantity: 1 Physician Procedures : CPT4 Code Description Modifier [id number] - WC PHYS LEVEL 3 - EST PT 25 ICD-10 Diagnosis Description L97.822 Non-pressure chronic ulcer of other part of left lower leg with fat layer exposed I87.332 Chronic venous hypertension (idiopathic) with  ulcer and inflammation of left lower extremity I10 Essential (primary) hypertension Quantity: 1 : [id number] - WC PHYS DEBR WO ANESTH 20 SQ CM ICD-10 Diagnosis Description L97.822 Non-pressure chronic ulcer of other part of left lower leg with fat layer exposed Quantity: 1 Electronic Signature(s) Signed: 01/27/2023 4:12:37 PM By: Duanne Guess MD FACS Entered By: Duanne Guess on 01/27/2023 16:12:37

## 2023-01-27 NOTE — Progress Notes (Signed)
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 Moisture Temperature / Pain Erin Abnormalities Noted: Yes Temperature: Erin Abnormality Electronic Signature(s) Signed: 01/27/2023 5:07:22 PM By: Zenaida Deed RN, BSN Entered By: Zenaida Deed on 01/27/2023 15:21:23 Erin [name] ([id number]) [id number] Page 12 of 13 -------------------------------------------------------------------------------- Wound Assessment Details Patient Name: Date of Service: Erin [name], Erin [name]. 01/27/2023 2:45 PM Medical Record Number: [id number] Patient Account Number: [id number] Date of Birth/Sex: Treating RN: 11/14/1940 (82 y.o. 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, Gout, Osteoarthritis Clustered Wound: Erin Photos Wound Measurements Length: (cm) Width: (cm) Depth: (cm) Area: (cm) Volume: (cm) 0 % Reduction in Area: 100% 0 % Reduction in Volume: 100% 0 Epithelialization: Large (67-100%) 0 Tunneling: Erin 0 Undermining: Erin Wound Description Classification: Full Thickness Without Exposed Support Structures Exudate Amount: None Present Foul Odor After Cleansing: Erin Slough/Fibrino Erin Wound Bed Granulation Amount: None Present (0%) Exposed Structure Necrotic Amount: None Present (0%) Fascia Exposed: Erin Fat Layer (Subcutaneous Tissue) Exposed: Erin Tendon Exposed: Erin Muscle Exposed: Erin Joint Exposed: Erin Bone Exposed: Erin Periwound Skin Texture Texture Color Erin Abnormalities Noted: Yes Erin Abnormalities Noted: Yes Moisture Temperature / Pain Erin Abnormalities Noted: Yes Temperature: Erin Abnormality Electronic Signature(s) Signed: 01/27/2023 5:07:22 PM By: Zenaida Deed RN, BSN Entered By: Zenaida Deed on 01/27/2023 15:22:00 Erin [name] ([id number]) [id number] Page 13 of 13 -------------------------------------------------------------------------------- Vitals Details Patient Name: Date of Service: Erin [name], Erin [name]. 01/27/2023 2:45 PM Medical Record Number: [id number] Patient Account Number: [id number] Date of Birth/Sex: Treating RN: 1941-05-30 (82 y.o. Erin [name] Primary Care Ciin Brazzel: Erin [name] Other Clinician: Referring Erin [name]: Treating Cigi Bega/Extender: [name], [name] in Treatment: 8 Vital Signs Time Taken: 14:59 Temperature (F): 97.9 Height (in): 60 Pulse (bpm): 71 Weight (lbs): 138 Respiratory Rate (breaths/min): 18 Body Mass Index (BMI): 26.9 Blood Pressure (mmHg): 207/81 Reference Range: 80 - 120 mg / dl Electronic Signature(s) Signed: 01/27/2023 5:07:22 PM By: Zenaida Deed RN, BSN Entered  By: Zenaida Deed on 01/27/2023 14:59:34

## 2023-01-28 ENCOUNTER — Ambulatory Visit (HOSPITAL_COMMUNITY)
Admission: RE | Admit: 2023-01-28 | Discharge: 2023-01-28 | Disposition: A | Discharge status: Home / Self Care | Payer: Medicare HMO | Source: Ambulatory Visit | Attending: Vascular Surgery | Admitting: Vascular Surgery

## 2023-01-28 ENCOUNTER — Ambulatory Visit (HOSPITAL_BASED_OUTPATIENT_CLINIC_OR_DEPARTMENT_OTHER): Payer: Medicare HMO | Admitting: General Surgery

## 2023-01-28 ENCOUNTER — Ambulatory Visit (INDEPENDENT_AMBULATORY_CARE_PROVIDER_SITE_OTHER)
Admission: RE | Admit: 2023-01-28 | Discharge: 2023-01-28 | Disposition: A | Discharge status: Home / Self Care | Payer: Medicare HMO | Source: Ambulatory Visit | Attending: Vascular Surgery | Admitting: Vascular Surgery

## 2023-01-28 DIAGNOSIS — S91302A Unspecified open wound, left foot, initial encounter: Secondary | ICD-10-CM | POA: Diagnosis not present

## 2023-01-28 LAB — VAS US ABI WITH/WO TBI

## 2023-02-03 ENCOUNTER — Encounter (HOSPITAL_BASED_OUTPATIENT_CLINIC_OR_DEPARTMENT_OTHER): Payer: Medicare HMO | Admitting: General Surgery

## 2023-02-03 DIAGNOSIS — I129 Hypertensive chronic kidney disease with stage 1 through stage 4 chronic kidney disease, or unspecified chronic kidney disease: Secondary | ICD-10-CM | POA: Diagnosis not present

## 2023-02-03 DIAGNOSIS — E039 Hypothyroidism, unspecified: Secondary | ICD-10-CM | POA: Diagnosis not present

## 2023-02-03 DIAGNOSIS — L97822 Non-pressure chronic ulcer of other part of left lower leg with fat layer exposed: Secondary | ICD-10-CM | POA: Diagnosis not present

## 2023-02-03 DIAGNOSIS — I87332 Chronic venous hypertension (idiopathic) with ulcer and inflammation of left lower extremity: Secondary | ICD-10-CM | POA: Diagnosis not present

## 2023-02-03 DIAGNOSIS — N183 Chronic kidney disease, stage 3 unspecified: Secondary | ICD-10-CM | POA: Diagnosis not present

## 2023-02-03 DIAGNOSIS — I890 Lymphedema, not elsewhere classified: Secondary | ICD-10-CM | POA: Diagnosis not present

## 2023-02-03 DIAGNOSIS — L97522 Non-pressure chronic ulcer of other part of left foot with fat layer exposed: Secondary | ICD-10-CM | POA: Diagnosis not present

## 2023-02-03 DIAGNOSIS — I872 Venous insufficiency (chronic) (peripheral): Secondary | ICD-10-CM | POA: Diagnosis not present

## 2023-02-03 DIAGNOSIS — M109 Gout, unspecified: Secondary | ICD-10-CM | POA: Diagnosis not present

## 2023-02-04 NOTE — Progress Notes (Signed)
Erin [name], Erin [name] ([id number]) [id number] Page 1 of 8 Visit Report for 02/03/2023 Arrival Information Details Patient Name: Date of Service: [name], [name] RBA RA N. 02/03/2023 2:00 PM Medical Record Number: [id number] Patient Account Number: [id number] Date of Birth/Sex: Treating RN: 1941/04/22 (82 y.o. F) Primary Care Raynold Blankenbaker: [name] Other Clinician: Referring Murlin Schrieber: Treating Cassaundra Rasch/Extender: [name] Weeks in Treatment: 9 Visit Information History Since Last Visit All ordered tests and consults were completed: Yes Patient Arrived: Ambulatory Added or deleted any medications: No Arrival Time: 14:26 Any new allergies or adverse reactions: No Accompanied By: daughter Had a fall or experienced change in No Transfer Assistance: None activities of daily living that may affect Patient Identification Verified: Yes risk of falls: Secondary Verification Process Completed: Yes Signs or symptoms of abuse/neglect since last visito No Patient Requires Transmission-Based Precautions: No Hospitalized since last visit: No Patient Has Alerts: No Implantable device outside of the clinic excluding No cellular tissue based products placed in the center since last visit: Has Dressing in Place as Prescribed: Yes Has Compression in Place as Prescribed: Yes Pain Present Now: No Electronic Signature(s) Signed: 02/03/2023 5:32:33 PM By: Zenaida Deed RN, BSN Entered By: Zenaida Deed on 02/03/2023 14:48:51 -------------------------------------------------------------------------------- Encounter Discharge Information Details Patient Name: Date of Service: [name], [name] 02/03/2023 2:00 PM Medical Record Number: [id number] Patient Account Number: [id number] Date of Birth/Sex: Treating RN: Oct 02, 1940 (82 y.o. [name] Primary Care Sarah Baez: [name] Other Clinician: Referring Melvina Pangelinan: Treating  Korin Setzler/Extender: [name] Weeks in Treatment: 9 Encounter Discharge Information Items Post Procedure Vitals Discharge Condition: Stable Temperature (F): 98.5 Ambulatory Status: Ambulatory Pulse (bpm): 71 Discharge Destination: Home Respiratory Rate (breaths/min): 18 Transportation: Private Auto Blood Pressure (mmHg): 183/98 Accompanied By: daughter Schedule Follow-up Appointment: Yes Clinical Summary of Care: Patient Declined Electronic Signature(s) Signed: 02/03/2023 5:32:33 PM By: Zenaida Deed RN, BSN Entered By: Zenaida Deed on 02/03/2023 16:40:26 [name] ([id number]) [id number] Page 2 of 8 -------------------------------------------------------------------------------- Lower Extremity Assessment Details Patient Name: Date of Service: [name], [name] 02/03/2023 2:00 PM Medical Record Number: [id number] Patient Account Number: [id number] Date of Birth/Sex: Treating RN: 31-Dec-1940 (82 y.o. [name] Primary Care Azaylah Stailey: [name] Other Clinician: Referring Esa Raden: Treating Doug Bucklin/Extender: [name], [name] in Treatment: 9 Edema Assessment Assessed: [Left: No] [Right: No] Edema: [Left: Ye] [Right: s] Calf Left: Right: Point of Measurement: From Medial Instep 28 cm Ankle Left: Right: Point of Measurement: From Medial Instep 19 cm Vascular Assessment Pulses: Dorsalis Pedis Palpable: [Left:No] Extremity colors, hair growth, and conditions: Extremity Color: [Left:Hyperpigmented] Hair Growth on Extremity: [Left:No] Temperature of Extremity: [Left:Warm < 3 seconds] Electronic Signature(s) Signed: 02/03/2023 5:32:33 PM By: Zenaida Deed RN, BSN Entered By: Zenaida Deed on 02/03/2023 14:51:05 -------------------------------------------------------------------------------- Multi Wound Chart Details Patient Name: Date of Service: [name], BA RBA  RA N. 02/03/2023 2:00 PM Medical Record Number: [id number] Patient Account Number: [id number] Date of Birth/Sex: Treating RN: 1940-12-09 (82 y.o. 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 N/A  Primary Etiology: Cataracts, Arrhythmia, Hypertension, Cataracts, Arrhythmia, Hypertension, N/A Comorbid History: Peripheral Venous Disease, Gout, Peripheral Venous Disease, Gout, Osteoarthritis Osteoarthritis 08/29/2022 01/06/2023 N/A Date Acquired: 9 4 N/A Weeks of Treatment: Open Healed - Epithelialized N/A Wound Status: No No N/A Wound Recurrence: 2.9x1.4x0.1 0x0x0 N/A Measurements L x W x D (cm) 3.189 0 N/A A (cm) : rea 0.319 0 N/A Volume (cm) : 45.70% 100.00% N/A % Reduction in A rea: 45.70% 100.00% N/A % Reduction in Volume: Full Thickness Without Exposed Full Thickness Without Exposed N/A Classification: Support Structures Support Structures Medium None Present N/A Exudate A mount: Serosanguineous N/A N/A Exudate Type: red, brown N/A N/A Exudate Color: Distinct, outline attached N/A N/A Wound Margin: Medium (34-66%) None Present (0%) N/A Granulation A mount: Red N/A N/A Granulation Quality: Medium (34-66%) None Present (0%) N/A Necrotic A mount: Fat Layer (Subcutaneous Tissue): Yes Fascia: No N/A Exposed Structures: Fascia: No Fat Layer (Subcutaneous Tissue): No Tendon: No Tendon: No Muscle: No Muscle: No Joint: No Joint:  No Bone: No Bone: No Small (1-33%) Large (67-100%) N/A Epithelialization: Debridement - Selective/Open Wound N/A N/A Debridement: Pre-procedure Verification/Time Out 15:00 N/A N/A Taken: Lidocaine 4% Topical Solution N/A N/A Pain Control: Slough N/A N/A Tissue Debrided: Non-Viable Tissue N/A N/A Level: 3.19 N/A N/A Debridement A (sq cm): rea Curette N/A N/A Instrument: Minimum N/A N/A Bleeding: Pressure N/A N/A Hemostasis A chieved: 1 N/A N/A Procedural Pain: 0 N/A N/A Post Procedural Pain: Procedure was tolerated well N/A N/A Debridement Treatment Response: 2.9x1.4x0.1 N/A N/A Post Debridement Measurements L x W x D (cm) 0.319 N/A N/A Post Debridement Volume: (cm) No Abnormalities Noted No Abnormalities Noted N/A Periwound Skin Texture: No Abnormalities Noted No Abnormalities Noted N/A Periwound Skin Moisture: No Abnormalities Noted Hemosiderin Staining: Yes N/A Periwound Skin Color: No Abnormality No Abnormality N/A Temperature: Yes N/A N/A Tenderness on Palpation: Debridement N/A N/A Procedures Performed: Treatment Notes Electronic Signature(s) Signed: 02/03/2023 3:06:13 PM By: Duanne Guess MD FACS Entered By: Duanne Guess on 02/03/2023 15:06:13 -------------------------------------------------------------------------------- Multi-Disciplinary Care Plan Details Patient Name: Date of Service: [name], [name] 02/03/2023 2:00 PM Medical Record Number: [id number] Patient Account Number: [id number] Date of Birth/Sex: Treating RN: May 06, 1941 (82 y.o. [name] Primary Care Xaria Judon: [name] Other Clinician: Referring Yaslyn Cumby: Treating Mahdiya Mossberg/Extender: [name] in Treatment: [address] [place] ([id number]) [id number] Page 4 of 8 Multidisciplinary Care Plan reviewed with physician Active Inactive Necrotic Tissue Nursing Diagnoses: Impaired tissue integrity  related to necrotic/devitalized tissue Knowledge deficit related to management of necrotic/devitalized tissue Goals: Necrotic/devitalized tissue will be minimized in the wound bed Date Initiated: 11/26/2022 Target Resolution Date: 03/06/2023 Goal Status: Active Patient/caregiver will verbalize understanding of reason and process for debridement of necrotic tissue Date Initiated: 11/26/2022 Date Inactivated: 01/20/2023 Target Resolution Date: 03/06/2023 Goal Status: Met Interventions: Assess patient pain level pre-, during and post procedure and prior to discharge Provide education on necrotic tissue and debridement process Treatment Activities: Apply topical anesthetic as ordered : 11/26/2022 Notes: Wound/Skin Impairment Nursing Diagnoses: Impaired tissue integrity Knowledge deficit related to ulceration/compromised skin integrity Goals: Patient/caregiver will verbalize understanding of skin care regimen Date Initiated: 11/26/2022 Target Resolution Date: 03/06/2023 Goal Status: Active Interventions: Assess ulceration(s) every visit Treatment Activities: Skin care regimen initiated : 11/26/2022 Topical wound management initiated : 11/26/2022 Notes: Electronic Signature(s) Signed: 02/03/2023 5:32:33 PM By: Zenaida Deed RN, BSN Entered By: Zenaida Deed on 02/03/2023 14:55:19 -------------------------------------------------------------------------------- Pain Assessment Details Patient Name: Date of Service: NO  RMA N, [name] 02/03/2023 2:00 PM Medical Record Number: [id number] Patient Account Number: [id number] Date of Birth/Sex: Treating RN: 12/08/1940 (82 y.o. F) Primary Care Harmonii Karle: [name] Other Clinician: Referring Rowen Hur: Treating Namiah Dunnavant/Extender: [name], [name] in Treatment: 9 Active Problems Location of Pain Severity and Description of Pain Patient Has Paino No Site Locations Rate the pain. MARJONA, FUGATE  ([id number]) [id number] Page 5 of 8 Rate the pain. Current Pain Level: 0 Pain Management and Medication Current Pain Management: Electronic Signature(s) Signed: 02/03/2023 5:32:33 PM By: Zenaida Deed RN, BSN Entered By: Zenaida Deed on 02/03/2023 14:49:20 -------------------------------------------------------------------------------- Patient/Caregiver Education Details Patient Name: Date of Service: [name], [name] 7/30/2024andnbsp2:00 PM Medical Record Number: [id number] Patient Account Number: [id number] Date of Birth/Gender: Treating RN: March 06, 1941 (82 y.o. [name] Primary Care Physician: [name] Other Clinician: Referring Physician: Treating Physician/Extender: [name] in Treatment: 9 Education Assessment Education Provided To: Patient Education Topics Provided Venous: Methods: Explain/Verbal Responses: Reinforcements needed, State content correctly Wound/Skin Impairment: Methods: Explain/Verbal Responses: Reinforcements needed, State content correctly Electronic Signature(s) Signed: 02/03/2023 5:32:33 PM By: Zenaida Deed RN, BSN Entered By: Zenaida Deed on 02/03/2023 14:55:49 -------------------------------------------------------------------------------- Wound Assessment Details Patient Name: Date of Service: [name], [name] 02/03/2023 2:00 PM TIFFINEY, STAPF ([id number]) [id number] Page 6 of 8 Medical Record Number: [id number] Patient Account Number: [id number] Date of Birth/Sex: Treating RN: 04/05/1941 (82 y.o. [name] Primary Care Romonda Parker: [name] Other Clinician: Referring Henretter Piekarski: Treating Kipper Buch/Extender: [name], [name] in Treatment: 9 Wound Status Wound Number: 1 Primary Venous Leg Ulcer Etiology: Wound Location: Left, Medial Lower Leg Wound Open Wounding Event: Gradually  Appeared Status: Date Acquired: 08/29/2022 Comorbid Cataracts, Arrhythmia, Hypertension, Peripheral Venous Weeks Of Treatment: 9 History: Disease, Gout, Osteoarthritis Clustered Wound: No Wound Measurements Length: (cm) 2.9 Width: (cm) 1.4 Depth: (cm) 0.1 Area: (cm) 3.189 Volume: (cm) 0.319 % Reduction in Area: 45.7% % Reduction in Volume: 45.7% Epithelialization: Small (1-33%) Tunneling: No Undermining: No Wound Description Classification: Full Thickness Without Exposed Support Structures Wound Margin: Distinct, outline attached Exudate Amount: Medium Exudate Type: Serosanguineous Exudate Color: red, brown Foul Odor After Cleansing: No Slough/Fibrino Yes Wound Bed Granulation Amount: Medium (34-66%) Exposed Structure Granulation Quality: Red Fascia Exposed: No Necrotic Amount: Medium (34-66%) Fat Layer (Subcutaneous Tissue) Exposed: Yes Necrotic Quality: Adherent Slough Tendon Exposed: No Muscle Exposed: No Joint Exposed: No Bone Exposed: No Periwound Skin Texture Texture Color No Abnormalities Noted: Yes No Abnormalities Noted: Yes Moisture Temperature / Pain No Abnormalities Noted: Yes Temperature: No Abnormality Tenderness on Palpation: Yes Treatment Notes Wound #1 (Lower Leg) Wound Laterality: Left, Medial Cleanser Soap and Water Discharge Instruction: May shower and wash wound with dial antibacterial soap and water prior to dressing change. Wound Cleanser Discharge Instruction: Cleanse the wound with wound cleanser prior to applying a clean dressing using gauze sponges, not tissue or cotton balls. Peri-Wound Care Sween Lotion (Moisturizing lotion) Discharge Instruction: Apply moisturizing lotion as directed Topical Gentamicin Discharge Instruction: As directed by physician Mupirocin Ointment Discharge Instruction: Apply Mupirocin (Bactroban) as instructed Primary Dressing Maxorb Extra Ag+ Alginate Dressing, 2x2 (in/in) Discharge Instruction: Apply to  wound bed as instructed Secondary Dressing ABD Pad, 5x9 Discharge Instruction: Apply over primary dressing as directed. Woven Gauze Sponge, Non-Sterile 4x4 in [place] ([id number]) [id number] Page 7 of 8 Discharge Instruction: Apply over primary dressing as directed. Secured With Adult nurse  4 inch (ACE bandage) Discharge Instruction: Secure with ACE bandage as directed. Kerlix Roll Sterile, 4.5x3.1 (in/yd) Discharge Instruction: Secure with Kerlix as directed. Compression Wrap Compression Stockings Add-Ons Electronic Signature(s) Signed: 02/03/2023 5:32:33 PM By: Zenaida Deed RN, BSN Entered By: Zenaida Deed on 02/03/2023 14:53:02 -------------------------------------------------------------------------------- Wound Assessment Details Patient Name: Date of Service: [name], [name] 02/03/2023 2:00 PM Medical Record Number: [id number] Patient Account Number: [id number] Date of Birth/Sex: Treating RN: 07-26-1940 (82 y.o. [name] Primary Care Mckaylee Dimalanta: [name] Other Clinician: Referring Zamara Cozad: Treating Tifanny Dollens/Extender: [name], [name] in Treatment: 9 Wound Status Wound Number: 4 Primary Venous Leg Ulcer Etiology: Wound Location: Left, Anterior Lower Leg Wound Healed - Epithelialized Wounding Event: Gradually Appeared Status: Date Acquired: 01/06/2023 Comorbid Cataracts, Arrhythmia, Hypertension, Peripheral Venous Weeks Of Treatment: 4 History: Disease, Gout, Osteoarthritis Clustered Wound: No Photos Wound Measurements Length: (cm) Width: (cm) Depth: (cm) Area: (cm) Volume: (cm) 0 % Reduction in Area: 100% 0 % Reduction in Volume: 100% 0 Epithelialization: Large (67-100%) 0 Tunneling: No 0 Undermining: No Wound Description Classification: Full Thickness Without Exposed Support Structures Exudate Amount: None Present Foul Odor After Cleansing: No Slough/Fibrino  No Wound Bed Granulation Amount: None Present (0%) Exposed Structure Necrotic Amount: None Present (0%) Fascia Exposed: No Fat Layer (Subcutaneous Tissue) Exposed: No Tendon Exposed: No ZAMYIAH, KIENER ([id number]) [id number] Page 8 of 8 Muscle Exposed: No Joint Exposed: No Bone Exposed: No Periwound Skin Texture Texture Color No Abnormalities Noted: Yes No Abnormalities Noted: No Hemosiderin Staining: Yes Moisture No Abnormalities Noted: Yes Temperature / Pain Temperature: No Abnormality Electronic Signature(s) Signed: 02/03/2023 5:32:33 PM By: Zenaida Deed RN, BSN Entered By: Zenaida Deed on 02/03/2023 14:53:24 -------------------------------------------------------------------------------- Vitals Details Patient Name: Date of Service: [name], [name] 02/03/2023 2:00 PM Medical Record Number: [id number] Patient Account Number: [id number] Date of Birth/Sex: Treating RN: May 21, 1941 (82 y.o. F) Primary Care Briggette Najarian: [name] Other Clinician: Referring Milinda Sweeney: Treating Junice Fei/Extender: [name], [name] in Treatment: 9 Vital Signs Time Taken: 02:27 Temperature (F): 98.5 Height (in): 60 Pulse (bpm): 71 Weight (lbs): 138 Respiratory Rate (breaths/min): 18 Body Mass Index (BMI): 26.9 Blood Pressure (mmHg): 183/98 Reference Range: 80 - 120 mg / dl Electronic Signature(s) Signed: 02/03/2023 5:32:33 PM By: Zenaida Deed RN, BSN Entered By: Zenaida Deed on 02/03/2023 14:49:14

## 2023-02-04 NOTE — Progress Notes (Signed)
[name], [name] ([id number]) [id number] Page 1 of 9 Visit Report for 02/03/2023 Chief Complaint Document Details Patient Name: Date of Service: Erin [name], [name] [name]. 02/03/2023 2:00 PM Medical Record Number: [id number] Patient Account Number: [id number] Date of Birth/Sex: Treating RN: Erin[birth year] (82 y.o. F) Primary Care Provider: [name] Other Clinician: Referring Provider: Treating Provider/Extender: [name] Weeks in Treatment: 9 Information Obtained from: Patient Chief Complaint Patient presents for treatment of an open ulcer due to venous insufficiency Electronic Signature(s) Signed: 02/03/2023 3:06:25 PM By: Duanne Guess MD FACS Entered By: Duanne Guess on 02/03/2023 15:06:25 -------------------------------------------------------------------------------- Debridement Details Patient Name: Date of Service: Erin [name], Erin [name]. 02/03/2023 2:00 PM Medical Record Number: [id number] Patient Account Number: [id number] Date of Birth/Sex: Treating RN: [birth year] (82 y.o. Erin [name] Primary Care Provider: [name] Other Clinician: Referring Provider: Treating Provider/Extender: [name] Weeks in Treatment: 9 Debridement Performed for Assessment: Wound #1 Left,Medial Lower Leg Performed By: Physician Duanne Guess, MD Debridement Type: Debridement Severity of Tissue Pre Debridement: Fat layer exposed Level of Consciousness (Pre-procedure): Awake and Alert Pre-procedure Verification/Time Out Yes - 15:00 Taken: Start Time: 15:00 Pain Control: Lidocaine 4% T opical Solution Percent of Wound Bed Debrided: 100% T Area Debrided (cm): otal 3.19 Tissue and other material debrided: Non-Viable, Slough, Slough Level: Non-Viable Tissue Debridement Description: Selective/Open Wound Instrument: Curette Bleeding: Minimum Hemostasis Achieved: Pressure Procedural Pain:  1 Post Procedural Pain: 0 Response to Treatment: Procedure was tolerated well Level of Consciousness (Post- Awake and Alert procedure): Post Debridement Measurements of Total Wound Length: (cm) 2.9 Width: (cm) 1.4 Depth: (cm) 0.1 Volume: (cm) 0.319 Character of Wound/Ulcer Post Debridement: Improved Severity of Tissue Post Debridement: Fat layer exposed Erin [name], Erin [name] ([id number]) [id number] Page 2 of 9 Post Procedure Diagnosis Same as Pre-procedure Notes Scribed for Dr Lady Gary by Zenaida Deed, RN Electronic Signature(s) Signed: 02/03/2023 3:21:59 PM By: Duanne Guess MD FACS Signed: 02/03/2023 5:32:33 PM By: Zenaida Deed RN, BSN Entered By: Zenaida Deed on 02/03/2023 15:02:28 -------------------------------------------------------------------------------- HPI Details Patient Name: Date of Service: Erin [name], Erin [name]. 02/03/2023 2:00 PM Medical Record Number: [id number] Patient Account Number: [id number] Date of Birth/Sex: Treating RN: Erin [name], Erin (82 y.o. F) Primary Care Provider: [name] Other Clinician: Referring Provider: Treating Provider/Extender: [name] Weeks in Treatment: 9 History of Present Illness HPI Description: ADMISSION 11/26/2022 This is an 82 year old woman who was referred by her primary care provider for a persistent nonhealing wound on her left lower extremity. It has been present since February of this year. 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 I do not see any formal venous reflux studies in the electronic medical record. She is not diabetic. She does not smoke. Her PCP has been washing her leg each week and applying a Kerlix and Coban wrap, but has not performed any formal debridement and has not been using any sort of topical contact layer or ointment. ABI in clinic was  noncompressible, but she has a palpable pedal pulse. 12/10/2022: The wound is a bit smaller and cleaner today. There is still fairly heavy slough on the surface. Edema control is acceptable. 12/16/2022: The wound is measuring the slightest bit smaller today. There is still thick slough on the surface. Edema control is good. 12/23/2022: The wound is a little bit smaller again today. Still with fairly  heavy slough accumulation. Edema control is good. She has a new small wound on the proximal portion of the same leg. It looks as though she may have developed a small blister over the weekend subsequently ruptured. There is minimal slough on the surface. 12/30/2022: The wound is about the same size, but substantially cleaner. There is some hypertrophic granulation tissue starting to emerge. The wound that was new last week has closed but she has a different new wound today on the lateral aspect of her left lower leg. It also appears to have been a blister that opened and ruptured. There is a little bit of slough on the surface. 01/06/2023: She has new wounds today. She has a wound on the PIP knuckle of her left third and fourth toes, as well as an anterior tibial wound. She says that the wrap was too tight and it also looks like when she pushes her foot into her shoe, it shifts the wrapping back to her midfoot causing bunching up of the wrap material and pain. The lateral leg wounds are both smaller with a little slough and eschar present. The original medial leg wound is smaller and more superficial. There is slough on the surface. 01/13/2023: She has new wounds today. The anterior tibial wound has a satellite lesion that has opened up adjacent to it. Has additional wounds on her fifth toe in addition to the existing ones on her third and fourth toe. The lateral leg wounds seem to have healed. The original medial leg wound is also smaller, but she and her daughter have several questions regarding why we are not  making forward progress, all of which are very reasonable. 01/20/2023: The wounds on her toes have healed. There is a tiny wound on the dorsum of her foot with a little bit of slough on it. The anterolateral leg wound has epithelialized quite substantially and the satellite that had opened last week is now closed. The medial leg wound measured narrower today. It has hypertrophic granulation tissue and slough present. 01/27/2023: The small wounds have all healed. The medial leg wound is smaller as well, with some slough on the surface but without reaccumulation of the hypertrophic granulation tissue. 02/03/2023: The only remaining open wound is the left medial leg wound. It is smaller and a bit more superficial. There is still slough accumulation on the surface. Erin hypertrophic granulation tissue. She had both her venous reflux and lower extremity arterial studies, both of which were essentially normal, although her TBI's are slightly diminished. Electronic Signature(s) Signed: 02/03/2023 3:07:16 PM By: Duanne Guess MD FACS Entered By: Duanne Guess on 02/03/2023 15:07:15 Erin [name] ([id number]) [id number] Page 3 of 9 -------------------------------------------------------------------------------- Physical Exam Details Patient Name: Date of Service: Erin [name], Erin [name]. 02/03/2023 2:00 PM Medical Record Number: [id number] Patient Account Number: [id number] Date of Birth/Sex: Treating RN: [birth year] (82 y.o. F) Primary Care Provider: [name] Other Clinician: Referring Provider: Treating Provider/Extender: [name], [name] in Treatment: 9 Constitutional Hypertensive, asymptomatic. . . . Erin acute distress. Respiratory Normal work of breathing on room air. Notes 02/03/2023: The only remaining open wound is the left medial leg wound. It is smaller and a bit more superficial. There is still slough accumulation on  the surface. Erin hypertrophic granulation tissue. Electronic Signature(s) Signed: 02/03/2023 3:07:44 PM By: Duanne Guess MD FACS Entered By: Duanne Guess on 02/03/2023 15:07:44 -------------------------------------------------------------------------------- Physician Orders Details Patient Name: Date of Service: Erin [name], Erin RBA RA  [name]. 02/03/2023 2:00 PM Medical Record Number: [id number] Patient Account Number: [id number] Date of Birth/Sex: Treating RN: [birth year] Erin (82 y.o. Erin [name] Primary Care Provider: [name] Other Clinician: Referring Provider: Treating Provider/Extender: [name] Weeks in Treatment: 9 Verbal / Phone Orders: Erin Diagnosis Coding ICD-10 Coding Code Description [phone] Non-pressure chronic ulcer of other part of left lower leg with fat layer exposed I87.332 Chronic venous hypertension (idiopathic) with ulcer and inflammation of left lower extremity I10 Essential (primary) hypertension Follow-up Appointments ppointment in 1 week. - Dr. Lady Gary - room 1 Return A Tuesday 8/6 @ 07:30 am Anesthetic (In clinic) Topical Lidocaine 4% applied to wound bed Bathing/ Shower/ Hygiene May shower with protection but do not get wound dressing(s) wet. Protect dressing(s) with water repellant cover (for example, large plastic bag) or a cast cover and may then take shower. Edema Control - Lymphedema / SCD / Other Elevate legs to the level of the heart or above for 30 minutes daily and/or when sitting for 3-4 times a day throughout the day. Avoid standing for long periods of time. Exercise regularly Wound Treatment Wound #1 - Lower Leg Wound Laterality: Left, Medial Cleanser: Soap and Water 1 x Per Week/30 Days LAQUINDA, WISWELL ([id number]) [id number] Page 4 of 9 Discharge Instructions: May shower and wash wound with dial antibacterial soap and water prior to dressing change. Cleanser: Wound Cleanser  1 x Per Week/30 Days Discharge Instructions: Cleanse the wound with wound cleanser prior to applying a clean dressing using gauze sponges, not tissue or cotton balls. Peri-Wound Care: Sween Lotion (Moisturizing lotion) 1 x Per Week/30 Days Discharge Instructions: Apply moisturizing lotion as directed Topical: Gentamicin 1 x Per Week/30 Days Discharge Instructions: As directed by physician Topical: Mupirocin Ointment 1 x Per Week/30 Days Discharge Instructions: Apply Mupirocin (Bactroban) as instructed Prim Dressing: Maxorb Extra Ag+ Alginate Dressing, 2x2 (in/in) 1 x Per Week/30 Days ary Discharge Instructions: Apply to wound bed as instructed Secondary Dressing: ABD Pad, 5x9 1 x Per Week/30 Days Discharge Instructions: Apply over primary dressing as directed. Secondary Dressing: Woven Gauze Sponge, Non-Sterile 4x4 in 1 x Per Week/30 Days Discharge Instructions: Apply over primary dressing as directed. Secured With: Elastic Bandage 4 inch (ACE bandage) 1 x Per Week/30 Days Discharge Instructions: Secure with ACE bandage as directed. Secured With: [employer], 4.5x3.1 (in/yd) 1 x Per Week/30 Days Discharge Instructions: Secure with Kerlix as directed. Electronic Signature(s) Signed: 02/03/2023 3:21:59 PM By: Duanne Guess MD FACS Entered By: Duanne Guess on 02/03/2023 15:07:59 -------------------------------------------------------------------------------- Problem List Details Patient Name: Date of Service: Erin [name], Erin [name]. 02/03/2023 2:00 PM Medical Record Number: [id number] Patient Account Number: [id number] Date of Birth/Sex: Treating RN: [birth year] Erin (82 y.o. Erin [name] Primary Care Provider: [name] Other Clinician: Referring Provider: Treating Provider/Extender: [name] Weeks in Treatment: 9 Active Problems ICD-10 Encounter Code Description Active Date MDM Diagnosis [phone] Non-pressure chronic ulcer of other  part of left lower leg with fat layer exposed5/22/2024 Erin Yes I87.332 Chronic venous hypertension (idiopathic) with ulcer and inflammation of left 11/26/2022 Erin Yes lower extremity I10 Essential (primary) hypertension 11/26/2022 Erin Yes Inactive Problems Resolved Problems Erin [name], Erin [name] ([id number]) [id number] Page 5 of 9 ICD-10 Code Description Active Date Resolved Date L97.522 Non-pressure chronic ulcer of other part of left foot with fat layer exposed 01/13/2023 01/13/2023 Electronic Signature(s) Signed: 02/03/2023 3:06:01 PM By: Duanne Guess MD FACS Entered By: Duanne Guess on 02/03/2023 15:06:01 --------------------------------------------------------------------------------  Progress Note Details Patient Name: Date of Service: Erin [name], [name] [name]. 02/03/2023 2:00 PM Medical Record Number: [id number] Patient Account Number: [id number] Date of Birth/Sex: Treating RN: [birth year] (82 y.o. F) Primary Care Provider: [name] Other Clinician: Referring Provider: Treating Provider/Extender: [name] Weeks in Treatment: 9 Subjective Chief Complaint Information obtained from Patient Patient presents for treatment of an open ulcer due to venous insufficiency History of Present Illness (HPI) ADMISSION 11/26/2022 This is an 82 year old woman who was referred by her primary care provider for a persistent nonhealing wound on her left lower extremity. It has been present since February of this year. 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 I do not see any formal venous reflux studies in the electronic medical record. She is not diabetic. She does not smoke. Her PCP has been washing her leg each week and applying a Kerlix and Coban wrap, but has not performed any formal debridement and has not been using any sort of topical contact  layer or ointment. ABI in clinic was noncompressible, but she has a palpable pedal pulse. 12/10/2022: The wound is a bit smaller and cleaner today. There is still fairly heavy slough on the surface. Edema control is acceptable. 12/16/2022: The wound is measuring the slightest bit smaller today. There is still thick slough on the surface. Edema control is good. 12/23/2022: The wound is a little bit smaller again today. Still with fairly heavy slough accumulation. Edema control is good. She has a new small wound on the proximal portion of the same leg. It looks as though she may have developed a small blister over the weekend subsequently ruptured. There is minimal slough on the surface. 12/30/2022: The wound is about the same size, but substantially cleaner. There is some hypertrophic granulation tissue starting to emerge. The wound that was new last week has closed but she has a different new wound today on the lateral aspect of her left lower leg. It also appears to have been a blister that opened and ruptured. There is a little bit of slough on the surface. 01/06/2023: She has new wounds today. She has a wound on the PIP knuckle of her left third and fourth toes, as well as an anterior tibial wound. She says that the wrap was too tight and it also looks like when she pushes her foot into her shoe, it shifts the wrapping back to her midfoot causing bunching up of the wrap material and pain. The lateral leg wounds are both smaller with a little slough and eschar present. The original medial leg wound is smaller and more superficial. There is slough on the surface. 01/13/2023: She has new wounds today. The anterior tibial wound has a satellite lesion that has opened up adjacent to it. Has additional wounds on her fifth toe in addition to the existing ones on her third and fourth toe. The lateral leg wounds seem to have healed. The original medial leg wound is also smaller, but she and her daughter have several  questions regarding why we are not making forward progress, all of which are very reasonable. 01/20/2023: The wounds on her toes have healed. There is a tiny wound on the dorsum of her foot with a little bit of slough on it. The anterolateral leg wound has epithelialized quite substantially and the satellite that had opened last week is now closed. The medial leg  wound measured narrower today. It has hypertrophic granulation tissue and slough present. 01/27/2023: The small wounds have all healed. The medial leg wound is smaller as well, with some slough on the surface but without reaccumulation of the hypertrophic granulation tissue. 02/03/2023: The only remaining open wound is the left medial leg wound. It is smaller and a bit more superficial. There is still slough accumulation on the surface. Erin hypertrophic granulation tissue. She had both her venous reflux and lower extremity arterial studies, both of which were essentially normal, although her TBI's are slightly diminished. Patient History Family History Cancer - Siblings, Lung Disease - Siblings, Erin family history of Diabetes, Heart Disease, Hereditary Spherocytosis, Hypertension, Kidney Disease, Seizures, Stroke, Thyroid Problems, Tuberculosis. Social History Never smoker, Marital Status - Widowed, Alcohol Use - Never, Drug Use - Erin History, Caffeine Use - Moderate. DEZARAE, ORDUNA ([id number]) [id number] Page 6 of 9 Medical History Eyes Patient has history of Cataracts Cardiovascular Patient has history of Arrhythmia - bundle branch block, Hypertension, Peripheral Venous Disease Musculoskeletal Patient has history of Gout, Osteoarthritis Hospitalization/Surgery History - Salpingoophorectomy. - Colon resection. - Appendectomy. - Cystoscopy w/ retrogrades. - Joint replacement. - Mastectomy right. - gastric ulcer repair. - Abdominal hysterectomy. Medical A Surgical History  Notes nd Hematologic/Lymphatic thrombocytopenia Gastrointestinal diverticulosis, GERD Endocrine hypothyroidism Genitourinary CKD stage III Musculoskeletal DJD, osteoporosis Oncologic breast cancer Objective Constitutional Hypertensive, asymptomatic. Erin acute distress. Vitals Time Taken: 2:27 AM, Height: 60 in, Weight: 138 lbs, BMI: 26.9, Temperature: 98.5 F, Pulse: 71 bpm, Respiratory Rate: 18 breaths/min, Blood Pressure: 183/98 mmHg. Respiratory Normal work of breathing on room air. General Notes: 02/03/2023: The only remaining open wound is the left medial leg wound. It is smaller and a bit more superficial. There is still slough accumulation on the surface. Erin hypertrophic granulation tissue. Integumentary (Hair, Skin) Wound #1 status is Open. Original cause of wound was Gradually Appeared. The date acquired was: 08/29/2022. The wound has been in treatment 9 weeks. The wound is located on the Left,Medial Lower Leg. The wound measures 2.9cm length x 1.4cm width x 0.1cm depth; 3.189cm^2 area and 0.319cm^3 volume. There is Fat Layer (Subcutaneous Tissue) exposed. There is Erin tunneling or undermining noted. There is a medium amount of serosanguineous drainage noted. The wound margin is distinct with the outline attached to the wound base. There is medium (34-66%) red granulation within the wound bed. There is a medium (34- 66%) amount of necrotic tissue within the wound bed including Adherent Slough. The periwound skin appearance had Erin abnormalities noted for texture. The periwound skin appearance had Erin abnormalities noted for moisture. The periwound skin appearance had Erin abnormalities noted for color. Periwound temperature was noted as Erin Abnormality. The periwound has tenderness on palpation. Wound #4 status is Healed - Epithelialized. Original cause of wound was Gradually Appeared. The date acquired was: 01/06/2023. The wound has been in treatment 4 weeks. The wound is located on the  Left,Anterior Lower Leg. The wound measures 0cm length x 0cm width x 0cm depth; 0cm^2 area and 0cm^3 volume. There is Erin tunneling or undermining noted. There is a none present amount of drainage noted. There is Erin granulation within the wound bed. There is Erin necrotic tissue within the wound bed. The periwound skin appearance had Erin abnormalities noted for texture. The periwound skin appearance had Erin abnormalities noted for moisture. The periwound skin appearance exhibited: Hemosiderin Staining. Periwound temperature was noted as Erin Abnormality. Assessment Active Problems ICD-10 Non-pressure chronic ulcer of other part of left  lower leg with fat layer exposed Chronic venous hypertension (idiopathic) with ulcer and inflammation of left lower extremity Essential (primary) hypertension Procedures Wound #1 Pre-procedure diagnosis of Wound #1 is a Venous Leg Ulcer located on the Left,Medial Lower Leg .Severity of Tissue Pre Debridement is: Fat layer exposed. There was a Selective/Open Wound Non-Viable Tissue Debridement with a total area of 3.19 sq cm performed by Duanne Guess, MD. With the following Erin [name], TREPANIER ([id number]) [id number] Page 7 of 9 instrument(s): Curette to remove Non-Viable tissue/material. Material removed includes [employer] [name] after achieving pain control using Lidocaine 4% Topical Solution. Erin specimens were taken. A time out was conducted at 15:00, prior to the start of the procedure. A Minimum amount of bleeding was controlled with Pressure. The procedure was tolerated well with a pain level of 1 throughout and a pain level of 0 following the procedure. Post Debridement Measurements: 2.9cm length x 1.4cm width x 0.1cm depth; 0.319cm^3 volume. Character of Wound/Ulcer Post Debridement is improved. Severity of Tissue Post Debridement is: Fat layer exposed. Post procedure Diagnosis Wound #1: Same as Pre-Procedure General Notes: Scribed for Dr  Lady Gary by Zenaida Deed, RN. Plan Follow-up Appointments: Return Appointment in 1 week. - Dr. Lady Gary - room 1 Tuesday 8/6 @ 07:30 am Anesthetic: (In clinic) Topical Lidocaine 4% applied to wound bed Bathing/ Shower/ Hygiene: May shower with protection but do not get wound dressing(s) wet. Protect dressing(s) with water repellant cover (for example, large plastic bag) or a cast cover and may then take shower. Edema Control - Lymphedema / SCD / Other: Elevate legs to the level of the heart or above for 30 minutes daily and/or when sitting for 3-4 times a day throughout the day. Avoid standing for long periods of time. Exercise regularly WOUND #1: - Lower Leg Wound Laterality: Left, Medial Cleanser: Soap and Water 1 x Per Week/30 Days Discharge Instructions: May shower and wash wound with dial antibacterial soap and water prior to dressing change. Cleanser: Wound Cleanser 1 x Per Week/30 Days Discharge Instructions: Cleanse the wound with wound cleanser prior to applying a clean dressing using gauze sponges, not tissue or cotton balls. Peri-Wound Care: Sween Lotion (Moisturizing lotion) 1 x Per Week/30 Days Discharge Instructions: Apply moisturizing lotion as directed Topical: Gentamicin 1 x Per Week/30 Days Discharge Instructions: As directed by physician Topical: Mupirocin Ointment 1 x Per Week/30 Days Discharge Instructions: Apply Mupirocin (Bactroban) as instructed Prim Dressing: Maxorb Extra Ag+ Alginate Dressing, 2x2 (in/in) 1 x Per Week/30 Days ary Discharge Instructions: Apply to wound bed as instructed Secondary Dressing: ABD Pad, 5x9 1 x Per Week/30 Days Discharge Instructions: Apply over primary dressing as directed. Secondary Dressing: Woven Gauze Sponge, Non-Sterile 4x4 in 1 x Per Week/30 Days Discharge Instructions: Apply over primary dressing as directed. Secured With: Elastic Bandage 4 inch (ACE bandage) 1 x Per Week/30 Days Discharge Instructions: Secure with ACE  bandage as directed. Secured With: [employer], 4.5x3.1 (in/yd) 1 x Per Week/30 Days Discharge Instructions: Secure with Kerlix as directed. 02/03/2023: The only remaining open wound is the left medial leg wound. It is smaller and a bit more superficial. There is still slough accumulation on the surface. Erin hypertrophic granulation tissue I used a curette to debride the slough from the wound surface. We will continue the mixture of topical gentamicin and mupirocin with silver alginate. Kerlix and Ace wrap compression. Follow-up in 1 week. Electronic Signature(s) Signed: 02/03/2023 3:09:34 PM By: Duanne Guess MD FACS Entered By: Duanne Guess on 02/03/2023 15:09:33 --------------------------------------------------------------------------------  HxROS Details Patient Name: Date of Service: Erin [name], [name] [name]. 02/03/2023 2:00 PM Medical Record Number: [id number] Patient Account Number: [id number] Date of Birth/Sex: Treating RN: [birth year] (82 y.o. F) Primary Care Provider: [name] Other Clinician: Referring Provider: Treating Provider/Extender: [name], [name] in Treatment: 9 Eyes Medical History: Positive for: Erin [name], Erin [name] ([id number]) [id number] Page 8 of 9 Hematologic/Lymphatic Medical History: Past Medical History Notes: thrombocytopenia Cardiovascular Medical History: Positive for: Arrhythmia - bundle branch block; Hypertension; Peripheral Venous Disease Gastrointestinal Medical History: Past Medical History Notes: diverticulosis, GERD Endocrine Medical History: Past Medical History Notes: hypothyroidism Genitourinary Medical History: Past Medical History Notes: CKD stage III Musculoskeletal Medical History: Positive for: Gout; Osteoarthritis Past Medical History Notes: DJD, osteoporosis Oncologic Medical History: Past Medical History Notes: breast cancer HBO Extended  History Items Eyes: Cataracts Immunizations Pneumococcal Vaccine: Received Pneumococcal Vaccination: Yes Received Pneumococcal Vaccination On or After 60th Birthday: Erin Implantable Devices Erin devices added Hospitalization / Surgery History Type of Hospitalization/Surgery Salpingoophorectomy Colon resection Appendectomy Cystoscopy w/ retrogrades Joint replacement Mastectomy right gastric ulcer repair Abdominal hysterectomy Family and Social History Cancer: Yes - Siblings; Diabetes: Erin; Heart Disease: Erin; Hereditary Spherocytosis: Erin; Hypertension: Erin; Kidney Disease: Erin; Lung Disease: Yes - Siblings; Seizures: Erin; Stroke: Erin; Thyroid Problems: Erin; Tuberculosis: Erin; Never smoker; Marital Status - Widowed; Alcohol Use: Never; Drug Use: Erin History; Caffeine Use: Moderate; Financial Concerns: Erin; Food, Clothing or Shelter Needs: Erin; Support System Lacking: Erin; Transportation Concerns: Erin Electronic Signature(s) Signed: 02/03/2023 3:21:59 PM By: Duanne Guess MD FACS Entered By: Duanne Guess on 02/03/2023 15:07:21 Erin [name] ([id number]) [id number] Page 9 of 9 -------------------------------------------------------------------------------- SuperBill Details Patient Name: Date of Service: Erin [name], [name] [name]. 02/03/2023 Medical Record Number: [id number] Patient Account Number: [id number] Date of Birth/Sex: Treating RN: [birth year] (82 y.o. 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 I10 Essential (primary) hypertension Facility Procedures : CPT4 Code: [id number] Description: 97597 -  DEBRIDE WOUND 1ST 20 SQ CM OR < ICD-10 Diagnosis Description L97.822 Non-pressure chronic ulcer of other part of left lower leg with fat layer exposed Modifier: Quantity: 1 Physician Procedures : CPT4 Code Description Modifier [id number] - WC PHYS LEVEL 4 - EST PT 25 ICD-10 Diagnosis Description L97.822 Non-pressure chronic ulcer of other part of left lower leg with fat layer exposed I87.332 Chronic venous hypertension (idiopathic) with  ulcer and inflammation of left lower extremity I10 Essential (primary) hypertension Quantity: 1 : [id number] - WC PHYS DEBR WO ANESTH 20 SQ CM ICD-10 Diagnosis Description L97.822 Non-pressure chronic ulcer of other part of left lower leg with fat layer exposed Quantity: 1 Electronic Signature(s) Signed: 02/03/2023 3:10:23 PM By: Duanne Guess MD FACS Entered By: Duanne Guess on 02/03/2023 15:10:23

## 2023-02-10 ENCOUNTER — Encounter (HOSPITAL_BASED_OUTPATIENT_CLINIC_OR_DEPARTMENT_OTHER): Payer: Medicare HMO | Attending: General Surgery | Admitting: General Surgery

## 2023-02-10 DIAGNOSIS — N183 Chronic kidney disease, stage 3 unspecified: Secondary | ICD-10-CM | POA: Diagnosis not present

## 2023-02-10 DIAGNOSIS — I872 Venous insufficiency (chronic) (peripheral): Secondary | ICD-10-CM | POA: Insufficient documentation

## 2023-02-10 DIAGNOSIS — L97822 Non-pressure chronic ulcer of other part of left lower leg with fat layer exposed: Secondary | ICD-10-CM | POA: Diagnosis not present

## 2023-02-10 DIAGNOSIS — I129 Hypertensive chronic kidney disease with stage 1 through stage 4 chronic kidney disease, or unspecified chronic kidney disease: Secondary | ICD-10-CM | POA: Diagnosis not present

## 2023-02-10 DIAGNOSIS — I87332 Chronic venous hypertension (idiopathic) with ulcer and inflammation of left lower extremity: Secondary | ICD-10-CM | POA: Diagnosis not present

## 2023-02-10 NOTE — Progress Notes (Signed)
Erin [name], Erin [name] ([id number]) [id number] Page 1 of 9 Visit Report for 02/10/2023 Chief Complaint Document Details Patient Name: Date of Service: [name], [name] RBA RA N. 02/10/2023 7:30 A M Medical Record Number: [id number] Patient Account Number: [id number] Date of Birth/Sex: Treating RN: 11-29-1940 (82 y.o. Erin [name] Primary Care Provider: [name] Other Clinician: Referring Provider: Treating Provider/Extender: [name] [name] in Treatment: 10 Information Obtained from: Patient Chief Complaint Patient presents for treatment of an open ulcer due to venous insufficiency Electronic Signature(s) Signed: 02/10/2023 8:01:06 AM By: Duanne Guess MD FACS Entered By: Duanne Guess on 02/10/2023 08:01:05 -------------------------------------------------------------------------------- Debridement Details Patient Name: Date of Service: [name], [name] 02/10/2023 7:30 A M Medical Record Number: [id number] Patient Account Number: [id number] Date of Birth/Sex: Treating RN: 22-May-1941 (82 y.o. Erin [name], Erin [name] Primary Care Provider: [name] Other Clinician: Referring Provider: Treating Provider/Extender: [name] [name] in Treatment: 10 Debridement Performed for Assessment: Wound #1 Left,Medial Lower Leg Performed By: Physician Duanne Guess, MD Debridement Type: Debridement Severity of Tissue Pre Debridement: Fat layer exposed Level of Consciousness (Pre-procedure): Awake and Alert Pre-procedure Verification/Time Out Yes - 07:50 Taken: Start Time: 07:53 Pain Control: Lidocaine 4% T opical Solution Percent of Wound Bed Debrided: 100% T Area Debrided (cm): otal 2.96 Tissue and other material debrided: Non-Viable, Slough, Slough Level: Non-Viable Tissue Debridement Description: Selective/Open Wound Instrument: Curette Bleeding: Minimum Hemostasis Achieved:  Pressure Procedural Pain: 4 Post Procedural Pain: 3 Response to Treatment: Procedure was tolerated well Level of Consciousness (Post- Awake and Alert procedure): Post Debridement Measurements of Total Wound Length: (cm) 2.9 Width: (cm) 1.3 Depth: (cm) 0.1 Volume: (cm) 0.296 Character of Wound/Ulcer Post Debridement: Improved Severity of Tissue Post Debridement: Fat layer exposed Erin [name], Erin [name] ([id number]) [id number] Page 2 of 9 Post Procedure Diagnosis Same as Pre-procedure Notes Scribed for Dr Lady Gary by Zenaida Deed, RN Electronic Signature(s) Signed: 02/10/2023 8:30:23 AM By: Duanne Guess MD FACS Signed: 02/10/2023 3:51:43 PM By: Zenaida Deed RN, BSN Entered By: Zenaida Deed on 02/10/2023 07:56:42 -------------------------------------------------------------------------------- HPI Details Patient Name: Date of Service: [name], [name] 02/10/2023 7:30 A M Medical Record Number: [id number] Patient Account Number: [id number] Date of Birth/Sex: Treating RN: [birth year] (82 y.o. Erin [name] Primary Care Provider: [name] Other Clinician: Referring Provider: Treating Provider/Extender: [name] [name] in Treatment: 10 History of Present Illness HPI Description: ADMISSION 11/26/2022 This is an 82 year old woman who was referred by her primary care provider for a persistent nonhealing wound on her left lower extremity. It has been present since February of this year. 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 I do not see any formal venous reflux studies in the electronic medical record. She is not diabetic. She does not smoke. Her PCP has been washing her leg each week and applying a Kerlix and Coban wrap, but has not performed any formal debridement and has not been using any sort of topical contact layer  or ointment. ABI in clinic was noncompressible, but she has a palpable pedal pulse. 12/10/2022: The wound is a bit smaller and cleaner today. There is still fairly heavy slough on the surface. Edema control is acceptable. 12/16/2022: The wound is measuring the slightest bit smaller today. There is still thick slough on the surface. Edema control is good. 12/23/2022: The wound is a little  bit smaller again today. Still with fairly heavy slough accumulation. Edema control is good. She has a new small wound on the proximal portion of the same leg. It looks as though she may have developed a small blister over the weekend subsequently ruptured. There is minimal slough on the surface. 12/30/2022: The wound is about the same size, but substantially cleaner. There is some hypertrophic granulation tissue starting to emerge. The wound that was new last week has closed but she has a different new wound today on the lateral aspect of her left lower leg. It also appears to have been a blister that opened and ruptured. There is a little bit of slough on the surface. 01/06/2023: She has new wounds today. She has a wound on the PIP knuckle of her left third and fourth toes, as well as an anterior tibial wound. She says that the wrap was too tight and it also looks like when she pushes her foot into her shoe, it shifts the wrapping back to her midfoot causing bunching up of the wrap material and pain. The lateral leg wounds are both smaller with a little slough and eschar present. The original medial leg wound is smaller and more superficial. There is slough on the surface. 01/13/2023: She has new wounds today. The anterior tibial wound has a satellite lesion that has opened up adjacent to it. Has additional wounds on her fifth toe in addition to the existing ones on her third and fourth toe. The lateral leg wounds seem to have healed. The original medial leg wound is also smaller, but she and her daughter have several  questions regarding why we are not making forward progress, all of which are very reasonable. 01/20/2023: The wounds on her toes have healed. There is a tiny wound on the dorsum of her foot with a little bit of slough on it. The anterolateral leg wound has epithelialized quite substantially and the satellite that had opened last week is now closed. The medial leg wound measured narrower today. It has hypertrophic granulation tissue and slough present. 01/27/2023: The small wounds have all healed. The medial leg wound is smaller as well, with some slough on the surface but without reaccumulation of the hypertrophic granulation tissue. 02/03/2023: The only remaining open wound is the left medial leg wound. It is smaller and a bit more superficial. There is still slough accumulation on the surface. No hypertrophic granulation tissue. She had both her venous reflux and lower extremity arterial studies, both of which were essentially normal, although her TBI's are slightly diminished. 02/10/2023: The left medial leg wound has some granulation tissue filling in, but overall, it is still fairly fibrotic. There is some slough on the surface today. Electronic Signature(s) Signed: 02/10/2023 8:01:47 AM By: Duanne Guess MD FACS Entered By: Duanne Guess on 02/10/2023 08:01:47 Erin [name] ([id number]) [id number] Page 3 of 9 -------------------------------------------------------------------------------- Physical Exam Details Patient Name: Date of Service: [name], [name] 02/10/2023 7:30 A M Medical Record Number: [id number] Patient Account Number: [id number] Date of Birth/Sex: Treating RN: 12-06-1940 (82 y.o. Erin [name] Primary Care Provider: [name] Other Clinician: Referring Provider: Treating Provider/Extender: Erin [name], Erin [name] in Treatment: 10 Constitutional Hypertensive, asymptomatic. . . . no acute  distress. Respiratory Normal work of breathing on room air. Notes 02/10/2023: The left medial leg wound has some granulation tissue filling in, but overall, it is still fairly fibrotic. There is some slough on the surface today. Electronic  Signature(s) Signed: 02/10/2023 8:04:29 AM By: Duanne Guess MD FACS Entered By: Duanne Guess on 02/10/2023 08:04:29 -------------------------------------------------------------------------------- Physician Orders Details Patient Name: Date of Service: [name], [name] 02/10/2023 7:30 A M Medical Record Number: [id number] Patient Account Number: [id number] Date of Birth/Sex: Treating RN: 07-24-1940 (82 y.o. Erin [name] Primary Care Provider: [name] Other Clinician: Referring Provider: Treating Provider/Extender: [name] [name] in Treatment: 10 Verbal / Phone Orders: No Diagnosis Coding ICD-10 Coding Code Description [id number] Non-pressure chronic ulcer of other part of left lower leg with fat layer exposed I87.332 Chronic venous hypertension (idiopathic) with ulcer and inflammation of left lower extremity I10 Essential (primary) hypertension Follow-up Appointments ppointment in 1 week. - Dr. Lady Gary - room 1 Return A Wed 8/14@ 2:00 pm Anesthetic (In clinic) Topical Lidocaine 4% applied to wound bed Bathing/ Shower/ Hygiene May shower with protection but do not get wound dressing(s) wet. Protect dressing(s) with water repellant cover (for example, large plastic bag) or a cast cover and may then take shower. Edema Control - Lymphedema / SCD / Other Elevate legs to the level of the heart or above for 30 minutes daily and/or when sitting for 3-4 times a day throughout the day. Avoid standing for long periods of time. Exercise regularly Wound Treatment Wound #1 - Lower Leg Wound Laterality: Left, Medial Cleanser: Soap and Water 1 x Per Week/30 Days Discharge Instructions: May shower and wash  wound with dial antibacterial soap and water prior to dressing change. Erin [name], Erin [name] ([id number]) [id number] Page 4 of 9 Cleanser: Wound Cleanser 1 x Per Week/30 Days Discharge Instructions: Cleanse the wound with wound cleanser prior to applying a clean dressing using gauze sponges, not tissue or cotton balls. Peri-Wound Care: Sween Lotion (Moisturizing lotion) 1 x Per Week/30 Days Discharge Instructions: Apply moisturizing lotion as directed Topical: Gentamicin 1 x Per Week/30 Days Discharge Instructions: As directed by physician Topical: Mupirocin Ointment 1 x Per Week/30 Days Discharge Instructions: Apply Mupirocin (Bactroban) as instructed Prim Dressing: Promogran Prisma Matrix, 4.34 (sq in) (silver collagen) 1 x Per Week/30 Days ary Discharge Instructions: Moisten collagen with saline or hydrogel Secondary Dressing: Optifoam Non-Adhesive Dressing, 4x4 in 1 x Per Week/30 Days Discharge Instructions: Apply over primary dressing as directed. Secured With: Elastic Bandage 4 inch (ACE bandage) 1 x Per Week/30 Days Discharge Instructions: Secure with ACE bandage as directed. Secured With: [employer], 4.5x3.1 (in/yd) 1 x Per Week/30 Days Discharge Instructions: Secure with Kerlix as directed. Electronic Signature(s) Signed: 02/10/2023 8:30:23 AM By: Duanne Guess MD FACS Entered By: Duanne Guess on 02/10/2023 08:04:45 -------------------------------------------------------------------------------- Problem List Details Patient Name: Date of Service: [name], [name] 02/10/2023 7:30 A M Medical Record Number: [id number] Patient Account Number: [id number] Date of Birth/Sex: Treating RN: [birth year] (82 y.o. Erin [name] Primary Care Provider: [name] Other Clinician: Referring Provider: Treating Provider/Extender: [name] [name] in Treatment: 10 Active Problems ICD-10 Encounter Code Description  Active Date MDM Diagnosis [phone] Non-pressure chronic ulcer of other part of left lower leg with fat layer exposed5/22/2024 No Yes I87.332 Chronic venous hypertension (idiopathic) with ulcer and inflammation of left 11/26/2022 No Yes lower extremity I10 Essential (primary) hypertension 11/26/2022 No Yes Inactive Problems Resolved Problems ICD-10 Code Description Active Date Resolved Date L97.522 Non-pressure chronic ulcer of other part of left foot with fat layer exposed 01/13/2023 01/13/2023 Erin [name] ([id number]) [phone].pdf Page 5 of 9 Electronic Signature(s) Signed: 02/10/2023  8:00:11 AM By: Duanne Guess MD FACS Entered By: Duanne Guess on 02/10/2023 08:00:11 -------------------------------------------------------------------------------- Progress Note Details Patient Name: Date of Service: [name], [name] 02/10/2023 7:30 A M Medical Record Number: [id number] Patient Account Number: [id number] Date of Birth/Sex: Treating RN: 09-08-1940 (82 y.o. Erin [name] Primary Care Provider: [name] Other Clinician: Referring Provider: Treating Provider/Extender: [name] [name] in Treatment: 10 Subjective Chief Complaint Information obtained from Patient Patient presents for treatment of an open ulcer due to venous insufficiency History of Present Illness (HPI) ADMISSION 11/26/2022 This is an 82 year old woman who was referred by her primary care provider for a persistent nonhealing wound on her left lower extremity. It has been present since February of this year. 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 I do not see any formal venous reflux studies in the electronic medical record. She is not diabetic. She does not smoke. Her PCP has been washing her leg each week and applying a Kerlix and Coban wrap, but has  not performed any formal debridement and has not been using any sort of topical contact layer or ointment. ABI in clinic was noncompressible, but she has a palpable pedal pulse. 12/10/2022: The wound is a bit smaller and cleaner today. There is still fairly heavy slough on the surface. Edema control is acceptable. 12/16/2022: The wound is measuring the slightest bit smaller today. There is still thick slough on the surface. Edema control is good. 12/23/2022: The wound is a little bit smaller again today. Still with fairly heavy slough accumulation. Edema control is good. She has a new small wound on the proximal portion of the same leg. It looks as though she may have developed a small blister over the weekend subsequently ruptured. There is minimal slough on the surface. 12/30/2022: The wound is about the same size, but substantially cleaner. There is some hypertrophic granulation tissue starting to emerge. The wound that was new last week has closed but she has a different new wound today on the lateral aspect of her left lower leg. It also appears to have been a blister that opened and ruptured. There is a little bit of slough on the surface. 01/06/2023: She has new wounds today. She has a wound on the PIP knuckle of her left third and fourth toes, as well as an anterior tibial wound. She says that the wrap was too tight and it also looks like when she pushes her foot into her shoe, it shifts the wrapping back to her midfoot causing bunching up of the wrap material and pain. The lateral leg wounds are both smaller with a little slough and eschar present. The original medial leg wound is smaller and more superficial. There is slough on the surface. 01/13/2023: She has new wounds today. The anterior tibial wound has a satellite lesion that has opened up adjacent to it. Has additional wounds on her fifth toe in addition to the existing ones on her third and fourth toe. The lateral leg wounds seem to have  healed. The original medial leg wound is also smaller, but she and her daughter have several questions regarding why we are not making forward progress, all of which are very reasonable. 01/20/2023: The wounds on her toes have healed. There is a tiny wound on the dorsum of her foot with a little bit of slough on it. The anterolateral leg wound  has epithelialized quite substantially and the satellite that had opened last week is now closed. The medial leg wound measured narrower today. It has hypertrophic granulation tissue and slough present. 01/27/2023: The small wounds have all healed. The medial leg wound is smaller as well, with some slough on the surface but without reaccumulation of the hypertrophic granulation tissue. 02/03/2023: The only remaining open wound is the left medial leg wound. It is smaller and a bit more superficial. There is still slough accumulation on the surface. No hypertrophic granulation tissue. She had both her venous reflux and lower extremity arterial studies, both of which were essentially normal, although her TBI's are slightly diminished. 02/10/2023: The left medial leg wound has some granulation tissue filling in, but overall, it is still fairly fibrotic. There is some slough on the surface today. Patient History Family History Cancer - Siblings, Lung Disease - Siblings, No family history of Diabetes, Heart Disease, Hereditary Spherocytosis, Hypertension, Kidney Disease, Seizures, Stroke, Thyroid Problems, Tuberculosis. Social History Never smoker, Marital Status - Widowed, Alcohol Use - Never, Drug Use - No History, Caffeine Use - Moderate. Medical History Eyes Patient has history [address] SMYA, [place] ([id number]) [id number] Page 6 of 9 Cardiovascular Patient has history of Arrhythmia - bundle branch block, Hypertension, Peripheral Venous Disease Musculoskeletal Patient has history of Gout,  Osteoarthritis Hospitalization/Surgery History - Salpingoophorectomy. - Colon resection. - Appendectomy. - Cystoscopy w/ retrogrades. - Joint replacement. - Mastectomy right. - gastric ulcer repair. - Abdominal hysterectomy. Medical A Surgical History Notes nd Hematologic/Lymphatic thrombocytopenia Gastrointestinal diverticulosis, GERD Endocrine hypothyroidism Genitourinary CKD stage III Musculoskeletal DJD, osteoporosis Oncologic breast cancer Objective Constitutional Hypertensive, asymptomatic. no acute distress. Vitals Time Taken: 7:38 AM, Height: 60 in, Weight: 138 lbs, BMI: 26.9, Temperature: 98.1 F, Pulse: 80 bpm, Respiratory Rate: 18 breaths/min, Blood Pressure: 208/82 mmHg. Respiratory Normal work of breathing on room air. General Notes: 02/10/2023: The left medial leg wound has some granulation tissue filling in, but overall, it is still fairly fibrotic. There is some slough on the surface today. Integumentary (Hair, Skin) Wound #1 status is Open. Original cause of wound was Gradually Appeared. The date acquired was: 08/29/2022. The wound has been in treatment 10 [name]. The wound is located on the Left,Medial Lower Leg. The wound measures 2.9cm length x 1.3cm width x 0.1cm depth; 2.961cm^2 area and 0.296cm^3 volume. There is Fat Layer (Subcutaneous Tissue) exposed. There is no tunneling or undermining noted. There is a medium amount of serosanguineous drainage noted. The wound margin is distinct with the outline attached to the wound base. There is small (1-33%) red granulation within the wound bed. There is a large (67-100%) amount of necrotic tissue within the wound bed including Adherent Slough. The periwound skin appearance had no abnormalities noted for texture. The periwound skin appearance had no abnormalities noted for moisture. The periwound skin appearance had no abnormalities noted for color. Periwound temperature was noted as No Abnormality. The periwound has  tenderness on palpation. 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 There was a Selective/Open Wound Non-Viable Tissue Debridement with a total area of 2.96 sq cm performed by Duanne Guess, MD. With the following instrument(s): Curette to remove Non-Viable tissue/material. Material  removed includes Slough after achieving pain control using Lidocaine 4% Topical Solution. No specimens were taken. A time out was conducted at 07:50, prior to the start of the procedure. A Minimum amount of bleeding was controlled with Pressure. The procedure was tolerated well with a pain level of 4 throughout and a pain level of 3 following the procedure. Post Debridement Measurements: 2.9cm length x 1.3cm width x 0.1cm depth; 0.296cm^3 volume. Character of Wound/Ulcer Post Debridement is improved. Severity of Tissue Post Debridement is: Fat layer exposed. Post procedure Diagnosis Wound #1: Same as Pre-Procedure General Notes: Scribed for Dr Lady Gary by Zenaida Deed, RN. Erin [name], Erin [name] ([id number]) [id number] Page 7 of 9 Plan Follow-up Appointments: Return Appointment in 1 week. - Dr. Lady Gary - room 1 Wed 8/14@ 2:00 pm Anesthetic: (In clinic) Topical Lidocaine 4% applied to wound bed Bathing/ Shower/ Hygiene: May shower with protection but do not get wound dressing(s) wet. Protect dressing(s) with water repellant cover (for example, large plastic bag) or a cast cover and may then take shower. Edema Control - Lymphedema / SCD / Other: Elevate legs to the level of the heart or above for 30 minutes daily and/or  when sitting for 3-4 times a day throughout the day. Avoid standing for long periods of time. Exercise regularly WOUND #1: - Lower Leg Wound Laterality: Left, Medial Cleanser: Soap and Water 1 x Per Week/30 Days Discharge Instructions: May shower and wash wound with dial antibacterial soap and water prior to dressing change. Cleanser: Wound Cleanser 1 x Per Week/30 Days Discharge Instructions: Cleanse the wound with wound cleanser prior to applying a clean dressing using gauze sponges, not tissue or cotton balls. Peri-Wound Care: Sween Lotion (Moisturizing lotion) 1 x Per Week/30 Days Discharge Instructions: Apply moisturizing lotion as directed Topical: Gentamicin 1 x Per Week/30 Days Discharge Instructions: As directed by physician Topical: Mupirocin Ointment 1 x Per Week/30 Days Discharge Instructions: Apply Mupirocin (Bactroban) as instructed Prim Dressing: Promogran Prisma Matrix, 4.34 (sq in) (silver collagen) 1 x Per Week/30 Days ary Discharge Instructions: Moisten collagen with saline or hydrogel Secondary Dressing: Optifoam Non-Adhesive Dressing, 4x4 in 1 x Per Week/30 Days Discharge Instructions: Apply over primary dressing as directed. Secured With: Elastic Bandage 4 inch (ACE bandage) 1 x Per Week/30 Days Discharge Instructions: Secure with ACE bandage as directed. Secured With: [employer], 4.5x3.1 (in/yd) 1 x Per Week/30 Days Discharge Instructions: Secure with Kerlix as directed. 02/10/2023: The left medial leg wound has some granulation tissue filling in, but overall, it is still fairly fibrotic. There is some slough on the surface today. I used a curette to debride slough from her wound. I am going to change the contact layer to Prisma silver collagen to see if we can encourage a less fibrotic surface to fill in. We will continue the topical gentamicin and mupirocin along with Kerlix and Ace bandage compression. Follow-up in 1 week. Electronic Signature(s) Signed:  02/10/2023 8:05:35 AM By: Duanne Guess MD FACS Entered By: Duanne Guess on 02/10/2023 08:05:35 -------------------------------------------------------------------------------- HxROS Details Patient Name: Date of Service: [name], [name] 02/10/2023 7:30 A M Medical Record Number: [id number] Patient Account Number: [id number] Date of Birth/Sex: Treating RN: 01/04/1941 (82 y.o. Erin [name] Primary Care Provider: [name] Other Clinician: Referring Provider: Treating Provider/Extender: [name] [name] in Treatment: 10 Eyes Medical History: Positive for: Cataracts Hematologic/Lymphatic Medical History: Past Medical History Notes: thrombocytopenia Cardiovascular Medical History: Positive for: Arrhythmia - bundle branch block; Hypertension;  Peripheral Venous Disease Erin [name], Erin [name] ([id number]) [id number] Page 8 of 9 Gastrointestinal Medical History: Past Medical History Notes: diverticulosis, GERD Endocrine Medical History: Past Medical History Notes: hypothyroidism Genitourinary Medical History: Past Medical History Notes: CKD stage III Musculoskeletal Medical History: Positive for: Gout; Osteoarthritis Past Medical History Notes: DJD, osteoporosis Oncologic Medical History: Past Medical History Notes: breast cancer HBO Extended History Items Eyes: Cataracts Immunizations Pneumococcal Vaccine: Received Pneumococcal Vaccination: Yes Received Pneumococcal Vaccination On or After 60th Birthday: No Implantable Devices No devices added Hospitalization / Surgery History Type of Hospitalization/Surgery Salpingoophorectomy Colon resection Appendectomy Cystoscopy w/ retrogrades Joint replacement Mastectomy right gastric ulcer repair Abdominal hysterectomy Family and Social History Cancer: Yes - Siblings; Diabetes: No; Heart Disease: No; Hereditary Spherocytosis: No; Hypertension: No;  Kidney Disease: No; Lung Disease: Yes - Siblings; Seizures: No; Stroke: No; Thyroid Problems: No; Tuberculosis: No; Never smoker; Marital Status - Widowed; Alcohol Use: Never; Drug Use: No History; Caffeine Use: Moderate; Financial Concerns: No; Food, Clothing or Shelter Needs: No; Support System Lacking: No; Transportation Concerns: No Psychologist, prison and probation services) Signed: 02/10/2023 8:30:23 AM By: Duanne Guess MD FACS Signed: 02/10/2023 3:51:43 PM By: Zenaida Deed RN, BSN Entered By: Duanne Guess on 02/10/2023 08:03:57 SuperBill Details -------------------------------------------------------------------------------- Erin [name] ([id number]) [id number] Page 9 of 9 Patient Name: Date of Service: [name], [name] 02/10/2023 Medical Record Number: [id number] Patient Account Number: [id number] Date of Birth/Sex: Treating RN: 08-07-1940 (82 y.o. Erin [name] Primary Care Provider: [name] Other Clinician: Referring Provider: Treating Provider/Extender: [name] [name] in Treatment: 10 Diagnosis Coding ICD-10 Codes Code Description [id number] Non-pressure chronic ulcer of other part of left lower leg with fat layer exposed I87.332 Chronic venous hypertension (idiopathic) with ulcer and inflammation of left lower extremity I10 Essential (primary) hypertension Facility Procedures CPT4 Code Description Modifier Quantity [id number] - DEBRIDE WOUND 1ST 20 SQ CM OR < 1 ICD-10 Diagnosis Description L97.822 Non-pressure chronic ulcer of other part of left lower leg with fat layer exposed Physician Procedures Quantity CPT4 Code Description Modifier [id number] - WC PHYS LEVEL 4 - EST PT 25 1 ICD-10 Diagnosis Description L97.822 Non-pressure chronic ulcer of other part of left lower leg with fat layer exposed I87.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

## 2023-02-10 NOTE — Progress Notes (Signed)
Erin [name], Erin [name] ([id number]) [id number] Page 1 of 7 Visit Report for 02/10/2023 Arrival Information Details Patient Name: Date of Service: [name], [name] RBA RA N. 02/10/2023 7:30 A M Medical Record Number: [id number] Patient Account Number: [id number] Date of Birth/Sex: Treating RN: 02-27-1941 (81 y.o. Erin [name] Primary Care : [name] Other Clinician: Referring : Treating /Extender: [name] Weeks in Treatment: 10 Visit Information History Since Last Visit Added or deleted any medications: No Patient Arrived: Ambulatory Any new allergies or adverse reactions: No Arrival Time: 07:34 Had a fall or experienced change in No Accompanied By: daughter activities of daily living that may affect Transfer Assistance: None risk of falls: Patient Identification Verified: Yes Signs or symptoms of abuse/neglect since last visito No Secondary Verification Process Completed: Yes Hospitalized since last visit: No Patient Requires Transmission-Based Precautions: No Implantable device outside of the clinic excluding No Patient Has Alerts: No cellular tissue based products placed in the center since last visit: Has Dressing in Place as Prescribed: Yes Has Compression in Place as Prescribed: Yes Pain Present Now: No Electronic Signature(s) Signed: 02/10/2023 3:51:43 PM By: Zenaida Deed RN, BSN Entered By: Zenaida Deed on 02/10/2023 07:38:10 -------------------------------------------------------------------------------- Encounter Discharge Information Details Patient Name: Date of Service: [name], [name] 02/10/2023 7:30 A M Medical Record Number: [id number] Patient Account Number: [id number] Date of Birth/Sex: Treating RN: 1941-07-07 (82 y.o. Erin [name] Primary Care : [name] Other Clinician: Referring : Treating /Extender: [name] Weeks in Treatment: 10 Encounter Discharge Information Items Post Procedure Vitals Discharge Condition: Stable Temperature (F): 98.7 Ambulatory Status: Ambulatory Pulse (bpm): 80 Discharge Destination: Home Respiratory Rate (breaths/min): 18 Transportation: Private Auto Blood Pressure (mmHg): 208/80 Accompanied By: daughter Schedule Follow-up Appointment: Yes Clinical Summary of Care: Patient Declined Electronic Signature(s) Signed: 02/10/2023 3:51:43 PM By: Zenaida Deed RN, BSN Entered By: Zenaida Deed on 02/10/2023 08:11:24 Erin [name] ([id number]) [id number] Page 2 of 7 -------------------------------------------------------------------------------- Lower Extremity Assessment Details Patient Name: Date of Service: [name], [name] 02/10/2023 7:30 A M Medical Record Number: [id number] Patient Account Number: [id number] Date of Birth/Sex: Treating RN: May 06, 1941 (82 y.o. Erin [name] Primary Care : [name] Other Clinician: Referring : Treating /Extender: Erin [name], Erin [name] Weeks in Treatment: 10 Edema Assessment Assessed: [Left: No] [Right: No] Edema: [Left: Ye] [Right: s] Calf Left: Right: Point of Measurement: From Medial Instep 27.5 cm Ankle Left: Right: Point of Measurement: From Medial Instep 18.5 cm Vascular Assessment Pulses: Dorsalis Pedis Palpable: [Left:Yes] Extremity colors, hair growth, and conditions: Extremity Color: [Left:Hyperpigmented] Hair Growth on Extremity: [Left:No] Temperature of Extremity: [Left:Warm < 3 seconds] Electronic Signature(s) Signed: 02/10/2023 3:51:43 PM By: Zenaida Deed RN, BSN Entered By: Zenaida Deed on 02/10/2023 07:43:41 -------------------------------------------------------------------------------- Multi Wound Chart Details Patient Name: Date of Service: [name], [name] 02/10/2023 7:30 A M Medical Record  Number: [id number] Patient Account Number: [id number] Date of Birth/Sex: Treating RN: 10/13/1940 (82 y.o. Erin [name] Primary Care : [name] Other Clinician: Referring : Treating /Extender: Erin [name], Erin [name] Weeks in Treatment: 10 Vital Signs Height(in): 60 Pulse(bpm): 80 Weight(lbs): 138 Blood Pressure(mmHg): 208/82 Body Mass Index(BMI): 26.9 Temperature(F): 98.1 Respiratory Rate(breaths/min): 18 [1:Photos:] [N/A:N/A] Left, Medial Lower Leg N/A N/A Wound Location: Gradually Appeared N/A N/A Wounding Event: Venous Leg Ulcer N/A N/A Primary Etiology: Cataracts, Arrhythmia, Hypertension, N/A N/A Comorbid History: Peripheral Venous Disease, Gout,  Osteoarthritis 08/29/2022 N/A N/A Date Acquired: 10 N/A N/A Weeks of Treatment: Open N/A N/A Wound Status: No N/A N/A Wound Recurrence: 2.9x1.3x0.1 N/A N/A Measurements L x W x D (cm) 2.961 N/A N/A A (cm) : rea 0.296 N/A N/A Volume (cm) : 49.60% N/A N/A % Reduction in A rea: 49.60% N/A N/A % Reduction in Volume: Full Thickness Without Exposed N/A N/A Classification: Support Structures Medium N/A N/A Exudate A mount: Serosanguineous N/A N/A Exudate Type: red, brown N/A N/A Exudate Color: Distinct, outline attached N/A N/A Wound Margin: Small (1-33%) N/A N/A Granulation A mount: Red N/A N/A Granulation Quality: Large (67-100%) N/A N/A Necrotic A mount: Fat Layer (Subcutaneous Tissue): Yes N/A N/A Exposed Structures: Fascia: No Tendon: No Muscle: No Joint: No Bone: No Small (1-33%) N/A N/A Epithelialization: Debridement - Selective/Open Wound N/A N/A Debridement: Pre-procedure Verification/Time Out 07:50 N/A N/A Taken: Lidocaine 4% Topical Solution N/A N/A Pain Control: Slough N/A N/A Tissue Debrided: Non-Viable Tissue N/A N/A Level: 2.96 N/A N/A Debridement A (sq cm): rea Curette N/A N/A Instrument: Minimum N/A N/A Bleeding: Pressure  N/A N/A Hemostasis A chieved: 4 N/A N/A Procedural Pain: 3 N/A N/A Post Procedural Pain: Procedure was tolerated well N/A N/A Debridement Treatment Response: 2.9x1.3x0.1 N/A N/A Post Debridement Measurements L x W x D (cm) 0.296 N/A N/A Post Debridement Volume: (cm) No Abnormalities Noted N/A N/A Periwound Skin Texture: No Abnormalities Noted N/A N/A Periwound Skin Moisture: No Abnormalities Noted N/A N/A Periwound Skin Color: No Abnormality N/A N/A Temperature: Yes N/A N/A Tenderness on Palpation: Debridement N/A N/A Procedures Performed: Treatment Notes Electronic Signature(s) Signed: 02/10/2023 8:00:57 AM By: Duanne Guess MD FACS Signed: 02/10/2023 3:51:43 PM By: Zenaida Deed RN, BSN Entered By: Duanne Guess on 02/10/2023 08:00:56 -------------------------------------------------------------------------------- Multi-Disciplinary Care Plan Details Patient Name: Date of Service: [name], [name] 02/10/2023 7:30 A M Medical Record Number: [id number] Patient Account Number: [id number] Date of Birth/Sex: Treating RN: Aug 06, 1940 (82 y.o. Erin [name] Primary Care : [name] Other Clinician: NONIE, Erin [name] ([id number]) [id number] Page 4 of 7 Referring : Treating /Extender: [name] Weeks in Treatment: 10 Multidisciplinary Care Plan reviewed with physician Active Inactive Necrotic Tissue Nursing Diagnoses: Impaired tissue integrity related to necrotic/devitalized tissue Knowledge deficit related to management of necrotic/devitalized tissue Goals: Necrotic/devitalized tissue will be minimized in the wound bed Date Initiated: 11/26/2022 Target Resolution Date: 03/06/2023 Goal Status: Active Patient/caregiver will verbalize understanding of reason and process for debridement of necrotic tissue Date Initiated: 11/26/2022 Date Inactivated: 01/20/2023 Target Resolution  Date: 03/06/2023 Goal Status: Met Interventions: Assess patient pain level pre-, during and post procedure and prior to discharge Provide education on necrotic tissue and debridement process Treatment Activities: Apply topical anesthetic as ordered : 11/26/2022 Notes: Wound/Skin Impairment Nursing Diagnoses: Impaired tissue integrity Knowledge deficit related to ulceration/compromised skin integrity Goals: Patient/caregiver will verbalize understanding of skin care regimen Date Initiated: 11/26/2022 Target Resolution Date: 03/06/2023 Goal Status: Active Interventions: Assess ulceration(s) every visit Treatment Activities: Skin care regimen initiated : 11/26/2022 Topical wound management initiated : 11/26/2022 Notes: Electronic Signature(s) Signed: 02/10/2023 3:51:43 PM By: Zenaida Deed RN, BSN Entered By: Zenaida Deed on 02/10/2023 07:49:37 -------------------------------------------------------------------------------- Pain Assessment Details Patient Name: Date of Service: [name], [name] 02/10/2023 7:30 A M Medical Record Number: [id number] Patient Account Number: [id number] Date of Birth/Sex: Treating RN: [birth year] (82 y.o. Erin [name] Primary Care : [name] Other Clinician: Referring : Treating /Extender: Erin [name],  Erin [name] Weeks in Treatment: 10 Active Problems Location of Pain Severity and Description of Pain Patient Has Erin [name], Erin [name] ([id number]) [id number] Page 5 of 7 Patient Has Paino Yes Site Locations Pain Location: Pain in Ulcers With Dressing Change: Yes Duration of the Pain. Constant / Intermittento Intermittent Rate the pain. Current Pain Level: 0 Worst Pain Level: 5 Least Pain Level: 0 Character of Pain Describe the Pain: Tender Pain Management and Medication Current Pain Management: Medication: Yes Is the Current Pain Management Adequate:  Adequate How does your wound impact your activities of daily livingo Sleep: No Bathing: No Appetite: No Relationship With Others: No Bladder Continence: No Emotions: No Bowel Continence: No Work: No Toileting: No Drive: No Dressing: No Hobbies: No Electronic Signature(s) Signed: 02/10/2023 3:51:43 PM By: Zenaida Deed RN, BSN Entered By: Zenaida Deed on 02/10/2023 07:39:13 -------------------------------------------------------------------------------- Patient/Caregiver Education Details Patient Name: Date of Service: [name], [name] 8/6/2024andnbsp7:30 A M Medical Record Number: [id number] Patient Account Number: [id number] Date of Birth/Gender: Treating RN: 03/13/1941 (82 y.o. Erin [name] Primary Care Physician: [name] Other Clinician: Referring Physician: Treating Physician/Extender: Erin [name] in Treatment: 10 Education Assessment Education Provided To: Patient Education Topics Provided Venous: Methods: Explain/Verbal Responses: Reinforcements needed, State content correctly Wound/Skin Impairment: Methods: Explain/Verbal Responses: Reinforcements needed, [address] content correctly Electronic Signature(s) Erin [name], Erin [name] ([id number]) [id number] Page 6 of 7 Signed: 02/10/2023 3:51:43 PM By: Zenaida Deed RN, BSN Entered By: Zenaida Deed on 02/10/2023 07:50:25 -------------------------------------------------------------------------------- Wound Assessment Details Patient Name: Date of Service: [name], [name] 02/10/2023 7:30 A M Medical Record Number: [id number] Patient Account Number: [id number] Date of Birth/Sex: Treating RN: [birth year] (82 y.o. Erin [name] Primary Care : [name] Other Clinician: Referring : Treating /Extender: Erin [name], Erin [name] Weeks in Treatment: 10 Wound Status Wound Number: 1 Primary Venous  Leg Ulcer Etiology: Wound Location: Left, Medial Lower Leg Wound Open Wounding Event: Gradually Appeared Status: Date Acquired: 08/29/2022 Comorbid Cataracts, Arrhythmia, Hypertension, Peripheral Venous Weeks Of Treatment: 10 History: Disease, Gout, Osteoarthritis Clustered Wound: No Photos Wound Measurements Length: (cm) 2.9 Width: (cm) 1.3 Depth: (cm) 0.1 Area: (cm) 2.961 Volume: (cm) 0.296 % Reduction in Area: 49.6% % Reduction in Volume: 49.6% Epithelialization: Small (1-33%) Tunneling: No Undermining: No Wound Description Classification: Full Thickness Without Exposed Support Wound Margin: Distinct, outline attached Exudate Amount: Medium Exudate Type: Serosanguineous Exudate Color: red, brown Structures Foul Odor After Cleansing: No Slough/Fibrino Yes Wound Bed Granulation Amount: Small (1-33%) Exposed Structure Granulation Quality: Red Fascia Exposed: No Necrotic Amount: Large (67-100%) Fat Layer (Subcutaneous Tissue) Exposed: Yes Necrotic Quality: Adherent Slough Tendon Exposed: No Muscle Exposed: No Joint Exposed: No Bone Exposed: No Periwound Skin Texture Texture Color No Abnormalities Noted: Yes No Abnormalities Noted: Yes Moisture Temperature / Pain No Abnormalities Noted: Yes Temperature: No Abnormality Tenderness on Palpation: Yes Treatment Notes Erin [name], Erin [name] ([id number]) [id number] Page 7 of 7 Wound #1 (Lower Leg) Wound Laterality: Left, Medial Cleanser Soap and Water Discharge Instruction: May shower and wash wound with dial antibacterial soap and water prior to dressing change. Wound Cleanser Discharge Instruction: Cleanse the wound with wound cleanser prior to applying a clean dressing using gauze sponges, not tissue or cotton balls. Peri-Wound Care Sween Lotion (Moisturizing lotion) Discharge Instruction: Apply moisturizing lotion as directed Topical Gentamicin Discharge Instruction: As directed by  physician Mupirocin Ointment Discharge Instruction: Apply Mupirocin (Bactroban) as instructed Primary Dressing Promogran Prisma Matrix, 4.34 (  sq in) (silver collagen) Discharge Instruction: Moisten collagen with saline or hydrogel Secondary Dressing Optifoam Non-Adhesive Dressing, 4x4 in Discharge Instruction: Apply over primary dressing as directed. Secured With Elastic Bandage 4 inch (ACE bandage) Discharge Instruction: Secure with ACE bandage as directed. Kerlix Roll Sterile, 4.5x3.1 (in/yd) Discharge Instruction: Secure with Kerlix as directed. Compression Wrap Compression Stockings Add-Ons Electronic Signature(s) Signed: 02/10/2023 3:51:43 PM By: Zenaida Deed RN, BSN Entered By: Zenaida Deed on 02/10/2023 07:48:46 -------------------------------------------------------------------------------- Vitals Details Patient Name: Date of Service: [name], [name] 02/10/2023 7:30 A M Medical Record Number: [id number] Patient Account Number: [id number] Date of Birth/Sex: Treating RN: Dec 14, 1940 (82 y.o. Erin [name] Primary Care : [name] Other Clinician: Referring : Treating /Extender: Erin [name], Erin [name] Weeks in Treatment: 10 Vital Signs Time Taken: 07:38 Temperature (F): 98.1 Height (in): 60 Pulse (bpm): 80 Weight (lbs): 138 Respiratory Rate (breaths/min): 18 Body Mass Index (BMI): 26.9 Blood Pressure (mmHg): 208/82 Reference Range: 80 - 120 mg / dl Electronic Signature(s) Signed: 02/10/2023 3:51:43 PM By: Zenaida Deed RN, BSN Entered By: Zenaida Deed on 02/10/2023 07:38:34

## 2023-02-18 ENCOUNTER — Encounter (HOSPITAL_BASED_OUTPATIENT_CLINIC_OR_DEPARTMENT_OTHER): Payer: Medicare HMO | Admitting: General Surgery

## 2023-02-18 DIAGNOSIS — I87332 Chronic venous hypertension (idiopathic) with ulcer and inflammation of left lower extremity: Secondary | ICD-10-CM | POA: Diagnosis not present

## 2023-02-18 DIAGNOSIS — L97822 Non-pressure chronic ulcer of other part of left lower leg with fat layer exposed: Secondary | ICD-10-CM | POA: Diagnosis not present

## 2023-02-18 DIAGNOSIS — I129 Hypertensive chronic kidney disease with stage 1 through stage 4 chronic kidney disease, or unspecified chronic kidney disease: Secondary | ICD-10-CM | POA: Diagnosis not present

## 2023-02-18 DIAGNOSIS — N183 Chronic kidney disease, stage 3 unspecified: Secondary | ICD-10-CM | POA: Diagnosis not present

## 2023-02-18 DIAGNOSIS — I872 Venous insufficiency (chronic) (peripheral): Secondary | ICD-10-CM | POA: Diagnosis not present

## 2023-02-19 NOTE — Progress Notes (Signed)
Erin [name] ([id number]) [id number] Page 1 of 9 Visit Report for 02/18/2023 Chief Complaint Document Details Patient Name: Date of Service: Erin [name], [name] Erin [name]. 02/18/2023 2:00 PM Medical Record Number: [id number] Patient Account Number: [id number] Date of Birth/Sex: Treating RN: 30-May-1941 (82 y.o. F) Primary Care Provider: [name] Other Clinician: Referring Provider: Treating Provider/Extender: [name] Weeks in Treatment: 12 Information Obtained from: Patient Chief Complaint Patient presents for treatment of an open ulcer due to venous insufficiency Electronic Signature(s) Signed: 02/18/2023 2:43:55 PM By: Duanne Guess MD FACS Entered By: Duanne Guess on 02/18/2023 14:43:54 -------------------------------------------------------------------------------- Debridement Details Patient Name: Date of Service: Erin [name], Erin [name]. 02/18/2023 2:00 PM Medical Record Number: [id number] Patient Account Number: [id number] Date of Birth/Sex: Treating RN: 1940-12-19 (82 y.o. Erin [name] Primary Care Provider: [name] Other Clinician: Referring Provider: Treating Provider/Extender: [name] Weeks in Treatment: 12 Debridement Performed for Assessment: Wound #1 Left,Medial Lower Leg Performed By: Physician Duanne Guess, MD Debridement Type: Debridement Severity of Tissue Pre Debridement: Fat layer exposed Level of Consciousness (Pre-procedure): Awake and Alert Pre-procedure Verification/Time Out Yes - 14:35 Taken: Start Time: 14:36 Pain Control: Lidocaine 4% T opical Solution Percent of Wound Bed Debrided: 100% T Area Debrided (cm): otal 2.07 Tissue and other material debrided: Non-Viable, Slough, Slough Level: Non-Viable Tissue Debridement Description: Selective/Open Wound Instrument: Curette Bleeding: Minimum Hemostasis Achieved: Pressure Procedural Pain:  0 Post Procedural Pain: 0 Response to Treatment: Procedure was tolerated well Level of Consciousness (Post- Awake and Alert procedure): Post Debridement Measurements of Total Wound Length: (cm) 2.4 Width: (cm) 1.1 Depth: (cm) 0.1 Volume: (cm) 0.207 Character of Wound/Ulcer Post Debridement: Improved Severity of Tissue Post Debridement: Fat layer exposed [name], [name] ([id number]) [phone].pdf Page 2 of 9 Post Procedure Diagnosis Same as Pre-procedure Notes Scribed for Dr Lady Gary by Erin Deed, RN Electronic Signature(s) Signed: 02/18/2023 2:57:57 PM By: Duanne Guess MD FACS Signed: 02/18/2023 5:11:24 PM By: Erin Deed RN, BSN Entered By: Erin [name] on 02/18/2023 14:38:06 -------------------------------------------------------------------------------- HPI Details Patient Name: Date of Service: Erin [name], Erin [name]. 02/18/2023 2:00 PM Medical Record Number: [id number] Patient Account Number: [id number] Date of Birth/Sex: Treating RN: [birth year] (82 y.o. F) Primary Care Provider: [name] Other Clinician: Referring Provider: Treating Provider/Extender: [name] Weeks in Treatment: 12 History of Present Illness HPI Description: ADMISSION 11/26/2022 This is an 82 year old woman who was referred by her primary care provider for a persistent nonhealing wound on her left lower extremity. It has been present since February of this year. 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 I do not see any formal venous reflux studies in the electronic medical record. She is not diabetic. She does not smoke. Her PCP has been washing her leg each week and applying a Kerlix and Coban wrap, but has not performed any formal debridement and has not been using any sort of topical contact layer or ointment. ABI in clinic was  noncompressible, but she has a palpable pedal pulse. 12/10/2022: The wound is a bit smaller and cleaner today. There is still fairly heavy slough on the surface. Edema control is acceptable. 12/16/2022: The wound is measuring the slightest bit smaller today. There is still thick slough on the surface. Edema control is good. 12/23/2022: The wound is a little bit smaller again today. Still with fairly  heavy slough accumulation. Edema control is good. She has a new small wound on the proximal portion of the same leg. It looks as though she may have developed a small blister over the weekend subsequently ruptured. There is minimal slough on the surface. 12/30/2022: The wound is about the same size, but substantially cleaner. There is some hypertrophic granulation tissue starting to emerge. The wound that was new last week has closed but she has a different new wound today on the lateral aspect of her left lower leg. It also appears to have been a blister that opened and ruptured. There is a little bit of slough on the surface. 01/06/2023: She has new wounds today. She has a wound on the PIP knuckle of her left third and fourth toes, as well as an anterior tibial wound. She says that the wrap was too tight and it also looks like when she pushes her foot into her shoe, it shifts the wrapping back to her midfoot causing bunching up of the wrap material and pain. The lateral leg wounds are both smaller with a little slough and eschar present. The original medial leg wound is smaller and more superficial. There is slough on the surface. 01/13/2023: She has new wounds today. The anterior tibial wound has a satellite lesion that has opened up adjacent to it. Has additional wounds on her fifth toe in addition to the existing ones on her third and fourth toe. The lateral leg wounds seem to have healed. The original medial leg wound is also smaller, but she and her daughter have several questions regarding why we are not  making forward progress, all of which are very reasonable. 01/20/2023: The wounds on her toes have healed. There is a tiny wound on the dorsum of her foot with a little bit of slough on it. The anterolateral leg wound has epithelialized quite substantially and the satellite that had opened last week is now closed. The medial leg wound measured narrower today. It has hypertrophic granulation tissue and slough present. 01/27/2023: The small wounds have all healed. The medial leg wound is smaller as well, with some slough on the surface but without reaccumulation of the hypertrophic granulation tissue. 02/03/2023: The only remaining open wound is the left medial leg wound. It is smaller and a bit more superficial. There is still slough accumulation on the surface. Erin hypertrophic granulation tissue. She had both her venous reflux and lower extremity arterial studies, both of which were essentially normal, although her TBI's are slightly diminished. 02/10/2023: The left medial leg wound has some granulation tissue filling in, but overall, it is still fairly fibrotic. There is some slough on the surface today. 02/18/2023: The moisture balance and surface consistency have both improved. There is some slough over granulation tissue. Edema control is good. Electronic Signature(s) Signed: 02/18/2023 2:46:10 PM By: Duanne Guess MD FACS Entered By: Duanne Guess on 02/18/2023 14:46:10 Erin [name] ([id number]) [id number] Page 3 of 9 -------------------------------------------------------------------------------- Physical Exam Details Patient Name: Date of Service: Erin [name], Erin [name]. 02/18/2023 2:00 PM Medical Record Number: [id number] Patient Account Number: [id number] Date of Birth/Sex: Treating RN: 02-01-1941 (82 y.o. F) Primary Care Provider: [name] Other Clinician: Referring Provider: Treating Provider/Extender: [name], Ravisankar  R Weeks in Treatment: 12 Constitutional Hypertensive, asymptomatic. . . . Erin acute distress. Respiratory Normal work of breathing on room air. Notes 02/18/2023: The moisture balance and surface consistency have both improved. There is some slough over granulation tissue.  Edema control is good. Electronic Signature(s) Signed: 02/18/2023 2:46:42 PM By: Duanne Guess MD FACS Entered By: Duanne Guess on 02/18/2023 14:46:42 -------------------------------------------------------------------------------- Physician Orders Details Patient Name: Date of Service: Erin [name], Erin [name]. 02/18/2023 2:00 PM Medical Record Number: [id number] Patient Account Number: [id number] Date of Birth/Sex: Treating RN: 1941/06/08 (82 y.o. Erin [name] Primary Care Provider: [name] Other Clinician: Referring Provider: Treating Provider/Extender: [name] Weeks in Treatment: 77 Verbal / Phone Orders: Erin Diagnosis Coding ICD-10 Coding Code Description [phone] Non-pressure chronic ulcer of other part of left lower leg with fat layer exposed I87.332 Chronic venous hypertension (idiopathic) with ulcer and inflammation of left lower extremity I10 Essential (primary) hypertension Follow-up Appointments ppointment in 1 week. - Dr. Lady Gary - room 1 Return A Tuesday 8/20 @ 2:00 pm Anesthetic (In clinic) Topical Lidocaine 4% applied to wound bed Bathing/ Shower/ Hygiene May shower with protection but do not get wound dressing(s) wet. Protect dressing(s) with water repellant cover (for example, large plastic bag) or a cast cover and may then take shower. Edema Control - Lymphedema / SCD / Other Elevate legs to the level of the heart or above for 30 minutes daily and/or when sitting for 3-4 times a day throughout the day. Avoid standing for long periods of time. Exercise regularly Wound Treatment Wound #1 - Lower Leg Wound Laterality: Left, Medial Cleanser: Soap and  Water 1 x Per Week/30 Days Discharge Instructions: May shower and wash wound with dial antibacterial soap and water prior to dressing change. Erin, [name] ([id number]) [id number] Page 4 of 9 Cleanser: Wound Cleanser 1 x Per Week/30 Days Discharge Instructions: Cleanse the wound with wound cleanser prior to applying a clean dressing using gauze sponges, not tissue or cotton balls. Peri-Wound Care: Sween Lotion (Moisturizing lotion) 1 x Per Week/30 Days Discharge Instructions: Apply moisturizing lotion as directed Topical: Gentamicin 1 x Per Week/30 Days Discharge Instructions: As directed by physician Topical: Mupirocin Ointment 1 x Per Week/30 Days Discharge Instructions: Apply Mupirocin (Bactroban) as instructed Prim Dressing: Promogran Prisma Matrix, 4.34 (sq in) (silver collagen) 1 x Per Week/30 Days ary Discharge Instructions: Moisten collagen with saline or hydrogel Secondary Dressing: Optifoam Non-Adhesive Dressing, 4x4 in 1 x Per Week/30 Days Discharge Instructions: Apply over primary dressing as directed. Secured With: Elastic Bandage 4 inch (ACE bandage) 1 x Per Week/30 Days Discharge Instructions: Secure with ACE bandage as directed. Secured With: [employer], 4.5x3.1 (in/yd) 1 x Per Week/30 Days Discharge Instructions: Secure with Kerlix as directed. Electronic Signature(s) Signed: 02/18/2023 2:57:57 PM By: Duanne Guess MD FACS Signed: 02/18/2023 5:11:24 PM By: Erin Deed RN, BSN Previous Signature: 02/18/2023 2:47:04 PM Version By: Duanne Guess MD FACS Entered By: Erin [name] on 02/18/2023 14:47:37 -------------------------------------------------------------------------------- Problem List Details Patient Name: Date of Service: Erin [name], Erin [name]. 02/18/2023 2:00 PM Medical Record Number: [id number] Patient Account Number: [id number] Date of Birth/Sex: Treating RN: [birth year] (82 y.o. Erin [name] Primary Care  Provider: [name] Other Clinician: Referring Provider: Treating Provider/Extender: [name] Weeks in Treatment: 12 Active Problems ICD-10 Encounter Code Description Active Date MDM Diagnosis L97.822 Non-pressure chronic ulcer of other part of left lower leg with fat layer exposed5/22/2024 Erin Yes I87.332 Chronic venous hypertension (idiopathic) with ulcer and inflammation of left 11/26/2022 Erin Yes lower extremity I10 Essential (primary) hypertension 11/26/2022 Erin Yes Inactive Problems Resolved Problems ICD-10 Code Description Active Date Resolved Date L97.522 Non-pressure chronic ulcer of  other part of left foot with fat layer exposed 01/13/2023 01/13/2023 Erin, [name] ([id number]) [id number] Page 5 of 9 Electronic Signature(s) Signed: 02/18/2023 2:40:11 PM By: Duanne Guess MD FACS Entered By: Duanne Guess on 02/18/2023 14:40:11 -------------------------------------------------------------------------------- Progress Note Details Patient Name: Date of Service: Erin [name], Erin [name]. 02/18/2023 2:00 PM Medical Record Number: [id number] Patient Account Number: [id number] Date of Birth/Sex: Treating RN: 1940-08-24 (82 y.o. F) Primary Care Provider: [name] Other Clinician: Referring Provider: Treating Provider/Extender: [name] Weeks in Treatment: 12 Subjective Chief Complaint Information obtained from Patient Patient presents for treatment of an open ulcer due to venous insufficiency History of Present Illness (HPI) ADMISSION 11/26/2022 This is an 82 year old woman who was referred by her primary care provider for a persistent nonhealing wound on her left lower extremity. It has been present since February of this year. 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 I do not see any  formal venous reflux studies in the electronic medical record. She is not diabetic. She does not smoke. Her PCP has been washing her leg each week and applying a Kerlix and Coban wrap, but has not performed any formal debridement and has not been using any sort of topical contact layer or ointment. ABI in clinic was noncompressible, but she has a palpable pedal pulse. 12/10/2022: The wound is a bit smaller and cleaner today. There is still fairly heavy slough on the surface. Edema control is acceptable. 12/16/2022: The wound is measuring the slightest bit smaller today. There is still thick slough on the surface. Edema control is good. 12/23/2022: The wound is a little bit smaller again today. Still with fairly heavy slough accumulation. Edema control is good. She has a new small wound on the proximal portion of the same leg. It looks as though she may have developed a small blister over the weekend subsequently ruptured. There is minimal slough on the surface. 12/30/2022: The wound is about the same size, but substantially cleaner. There is some hypertrophic granulation tissue starting to emerge. The wound that was new last week has closed but she has a different new wound today on the lateral aspect of her left lower leg. It also appears to have been a blister that opened and ruptured. There is a little bit of slough on the surface. 01/06/2023: She has new wounds today. She has a wound on the PIP knuckle of her left third and fourth toes, as well as an anterior tibial wound. She says that the wrap was too tight and it also looks like when she pushes her foot into her shoe, it shifts the wrapping back to her midfoot causing bunching up of the wrap material and pain. The lateral leg wounds are both smaller with a little slough and eschar present. The original medial leg wound is smaller and more superficial. There is slough on the surface. 01/13/2023: She has new wounds today. The anterior tibial wound has a  satellite lesion that has opened up adjacent to it. Has additional wounds on her fifth toe in addition to the existing ones on her third and fourth toe. The lateral leg wounds seem to have healed. The original medial leg wound is also smaller, but she and her daughter have several questions regarding why we are not making forward progress, all of which are very reasonable. 01/20/2023: The wounds on her toes have healed. There is  a tiny wound on the dorsum of her foot with a little bit of slough on it. The anterolateral leg wound has epithelialized quite substantially and the satellite that had opened last week is now closed. The medial leg wound measured narrower today. It has hypertrophic granulation tissue and slough present. 01/27/2023: The small wounds have all healed. The medial leg wound is smaller as well, with some slough on the surface but without reaccumulation of the hypertrophic granulation tissue. 02/03/2023: The only remaining open wound is the left medial leg wound. It is smaller and a bit more superficial. There is still slough accumulation on the surface. Erin hypertrophic granulation tissue. She had both her venous reflux and lower extremity arterial studies, both of which were essentially normal, although her TBI's are slightly diminished. 02/10/2023: The left medial leg wound has some granulation tissue filling in, but overall, it is still fairly fibrotic. There is some slough on the surface today. 02/18/2023: The moisture balance and surface consistency have both improved. There is some slough over granulation tissue. Edema control is good. Patient History Family History Cancer - Siblings, Lung Disease - Siblings, Erin family history of Diabetes, Heart Disease, Hereditary Spherocytosis, Hypertension, Kidney Disease, Seizures, Stroke, Thyroid Problems, Tuberculosis. Social History Never smoker, Marital Status - Widowed, Alcohol Use - Never, Drug Use - Erin History, Caffeine Use -  Moderate. Erin, [name] ([id number]) [id number] Page 6 of 9 Medical History Eyes Patient has history of Cataracts Cardiovascular Patient has history of Arrhythmia - bundle branch block, Hypertension, Peripheral Venous Disease Musculoskeletal Patient has history of Gout, Osteoarthritis Hospitalization/Surgery History - Salpingoophorectomy. - Colon resection. - Appendectomy. - Cystoscopy w/ retrogrades. - Joint replacement. - Mastectomy right. - gastric ulcer repair. - Abdominal hysterectomy. Medical A Surgical History Notes nd Hematologic/Lymphatic thrombocytopenia Gastrointestinal diverticulosis, GERD Endocrine hypothyroidism Genitourinary CKD stage III Musculoskeletal DJD, osteoporosis Oncologic breast cancer Objective Constitutional Hypertensive, asymptomatic. Erin acute distress. Vitals Time Taken: 2:08 AM, Height: 60 in, Weight: 138 lbs, BMI: 26.9, Temperature: 97.5 F, Pulse: 72 bpm, Respiratory Rate: 18 breaths/min, Blood Pressure: 197/69 mmHg. Respiratory Normal work of breathing on room air. General Notes: 02/18/2023: The moisture balance and surface consistency have both improved. There is some slough over granulation tissue. Edema control is good. Integumentary (Hair, Skin) Wound #1 status is Open. Original cause of wound was Gradually Appeared. The date acquired was: 08/29/2022. The wound has been in treatment 12 weeks. The wound is located on the Left,Medial Lower Leg. The wound measures 2.4cm length x 1.1cm width x 0.1cm depth; 2.073cm^2 area and 0.207cm^3 volume. There is Fat Layer (Subcutaneous Tissue) exposed. There is Erin tunneling or undermining noted. There is a medium amount of serosanguineous drainage noted. The wound margin is distinct with the outline attached to the wound base. There is small (1-33%) red granulation within the wound bed. There is a large (67-100%) amount of necrotic tissue within the wound bed including Adherent  Slough. The periwound skin appearance had Erin abnormalities noted for texture. The periwound skin appearance had Erin abnormalities noted for moisture. The periwound skin appearance had Erin abnormalities noted for color. Periwound temperature was noted as Erin Abnormality. The periwound has tenderness on palpation. Assessment Active Problems ICD-10 Non-pressure chronic ulcer of other part of left lower leg with fat layer exposed Chronic venous hypertension (idiopathic) with ulcer and inflammation of left lower extremity Essential (primary) hypertension Procedures Wound #1 Pre-procedure diagnosis of Wound #1 is a Venous Leg Ulcer located on the Left,Medial Lower Leg .Severity of Tissue Pre  Debridement is: Fat layer exposed. There was a Selective/Open Wound Non-Viable Tissue Debridement with a total area of 2.07 sq cm performed by Duanne Guess, MD. With the following instrument(s): Curette to remove Non-Viable tissue/material. Material removed includes [facility] after achieving pain control using Lidocaine 4% Topical Solution. Erin specimens were taken. A time out was conducted at 14:35, prior to the start of the procedure. A Minimum amount of bleeding was controlled with Pressure. The procedure was tolerated well with a pain level of 0 throughout and a pain level of 0 following the procedure. Post Debridement Measurements: 2.4cm length x 1.1cm width x 0.1cm depth; 0.207cm^3 volume. Character of Wound/Ulcer Post Debridement is improved. Severity of Tissue Post Debridement is: Fat layer exposed. Post procedure Diagnosis Wound #1: Same as Pre-Procedure Erin, [name] ([id number]) [phone].pdf Page 7 of 9 General Notes: Scribed for Dr Lady Gary by Erin Deed, RN. Plan 02/18/2023: The moisture balance and surface consistency have both improved. There is some slough over granulation tissue. Edema control is good. I used a curette to debride the slough from the wound. We will  continue the mixture of topical gentamicin and mupirocin along with Prisma silver collagen and Optifoam underneath Kerlix and Ace bandage wrap. Follow-up in 1 week. Electronic Signature(s) Signed: 02/18/2023 2:48:02 PM By: Duanne Guess MD FACS Entered By: Duanne Guess on 02/18/2023 14:48:01 -------------------------------------------------------------------------------- HxROS Details Patient Name: Date of Service: Erin [name], Erin [name]. 02/18/2023 2:00 PM Medical Record Number: [id number] Patient Account Number: [id number] Date of Birth/Sex: Treating RN: 07/21/1940 (82 y.o. F) Primary Care Provider: [name] Other Clinician: Referring Provider: Treating Provider/Extender: [name], [name] in Treatment: 12 Eyes Medical History: Positive for: Cataracts Hematologic/Lymphatic Medical History: Past Medical History Notes: thrombocytopenia Cardiovascular Medical History: Positive for: Arrhythmia - bundle branch block; Hypertension; Peripheral Venous Disease Gastrointestinal Medical History: Past Medical History Notes: diverticulosis, GERD Endocrine Medical History: Past Medical History Notes: hypothyroidism Genitourinary Medical History: Past Medical History Notes: CKD stage III Musculoskeletal Medical History: Positive for: Gout; Osteoarthritis Past Medical History Notes: DJD, osteoporosis Erin, [name] ([id number]) [id number] Page 8 of 9 Oncologic Medical History: Past Medical History Notes: breast cancer HBO Extended History Items Eyes: Cataracts Immunizations Pneumococcal Vaccine: Received Pneumococcal Vaccination: Yes Received Pneumococcal Vaccination On or After 60th Birthday: Erin Implantable Devices Erin devices added Hospitalization / Surgery History Type of Hospitalization/Surgery Salpingoophorectomy Colon resection Appendectomy Cystoscopy w/ retrogrades Joint replacement Mastectomy  right gastric ulcer repair Abdominal hysterectomy Family and Social History Cancer: Yes - Siblings; Diabetes: Erin; Heart Disease: Erin; Hereditary Spherocytosis: Erin; Hypertension: Erin; Kidney Disease: Erin; Lung Disease: Yes - Siblings; Seizures: Erin; Stroke: Erin; Thyroid Problems: Erin; Tuberculosis: Erin; Never smoker; Marital Status - Widowed; Alcohol Use: Never; Drug Use: Erin History; Caffeine Use: Moderate; Financial Concerns: Erin; Food, Clothing or Shelter Needs: Erin; Support System Lacking: Erin; Transportation Concerns: Erin Electronic Signature(s) Signed: 02/18/2023 2:57:57 PM By: Duanne Guess MD FACS Entered By: Duanne Guess on 02/18/2023 14:46:19 -------------------------------------------------------------------------------- SuperBill Details Patient Name: Date of Service: Erin [name], Erin [name]. 02/18/2023 Medical Record Number: [id number] Patient Account Number: [id number] Date of Birth/Sex: Treating RN: 17-Nov-1940 (82 y.o. 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 Modifier: d Quantity: 1 Physician Procedures Erin, [name] ([id number]): CPT4 Code Description [id number] - WC PHYS LEVEL 4 - EST PT ICD-10 Diagnosis Description 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

## 2023-02-19 NOTE — Progress Notes (Signed)
Erin [name], Erin [name] ([id number]) [id number] Page 1 of 7 Visit Report for 02/18/2023 Arrival Information Details Patient Name: Date of Service: [name], [name] RBA RA N. 02/18/2023 2:00 PM Medical Record Number: [id number] Patient Account Number: [id number] Date of Birth/Sex: Treating RN: [birth year] (82 y.o. F) Primary Care : [name] Other Clinician: Referring : Treating /Extender: [name] Weeks in Treatment: 12 Visit Information History Since Last Visit All ordered tests and consults were completed: No Patient Arrived: Ambulatory Added or deleted any medications: No Arrival Time: 14:07 Any new allergies or adverse reactions: No Accompanied By: daughter Had a fall or experienced change in No Transfer Assistance: None activities of daily living that may affect Patient Identification Verified: Yes risk of falls: Secondary Verification Process Completed: Yes Signs or symptoms of abuse/neglect since last visito No Patient Requires Transmission-Based Precautions: No Hospitalized since last visit: No Patient Has Alerts: No Implantable device outside of the clinic excluding No cellular tissue based products placed in the center since last visit: Pain Present Now: No Electronic Signature(s) Signed: 02/18/2023 2:31:22 PM By: [place] Scrape Entered By: [place] Scrape on 02/18/2023 14:08:20 -------------------------------------------------------------------------------- Encounter Discharge Information Details Patient Name: Date of Service: [name], [name] 02/18/2023 2:00 PM Medical Record Number: [id number] Patient Account Number: [id number] Date of Birth/Sex: Treating RN: 20-Aug-1940 (82 y.o. Erin [name] Primary Care : [name] Other Clinician: Referring : Treating /Extender: [name] Weeks in Treatment: 12 Encounter Discharge  Information Items Post Procedure Vitals Discharge Condition: Stable Temperature (F): 97.5 Ambulatory Status: Ambulatory Pulse (bpm): 72 Discharge Destination: Home Respiratory Rate (breaths/min): 18 Transportation: Private Auto Blood Pressure (mmHg): 197/69 Accompanied By: daughter Schedule Follow-up Appointment: Yes Clinical Summary of Care: Patient Declined Electronic Signature(s) Signed: 02/18/2023 5:11:24 PM By: Zenaida Deed RN, BSN Entered By: Zenaida Deed on 02/18/2023 14:50:41 Erin [name] ([id number]) [id number] Page 2 of 7 -------------------------------------------------------------------------------- Lower Extremity Assessment Details Patient Name: Date of Service: [name], [name] 02/18/2023 2:00 PM Medical Record Number: [id number] Patient Account Number: [id number] Date of Birth/Sex: Treating RN: 03-07-1941 (82 y.o. Erin [name] Primary Care : [name] Other Clinician: Referring : Treating /Extender: [name], [name] in Treatment: 12 Edema Assessment Assessed: [Left: No] [Right: No] Edema: [Left: Ye] [Right: s] Calf Left: Right: Point of Measurement: From Medial Instep 28.5 cm Ankle Left: Right: Point of Measurement: From Medial Instep 19 cm Vascular Assessment Pulses: Dorsalis Pedis Palpable: [Left:Yes] Extremity colors, hair growth, and conditions: Extremity Color: [Left:Hyperpigmented] Hair Growth on Extremity: [Left:No] Temperature of Extremity: [Left:Warm < 3 seconds] Electronic Signature(s) Signed: 02/18/2023 5:11:24 PM By: Zenaida Deed RN, BSN Entered By: Zenaida Deed on 02/18/2023 14:28:54 -------------------------------------------------------------------------------- Multi Wound Chart Details Patient Name: Date of Service: [name], [name] 02/18/2023 2:00 PM Medical Record Number: [id number] Patient Account Number:  [id number] Date of Birth/Sex: Treating RN: [birth year] (82 y.o. F) Primary Care : [name] Other Clinician: Referring : Treating /Extender: [name], [name] in Treatment: 12 Vital Signs Height(in): 60 Pulse(bpm): 72 Weight(lbs): 138 Blood Pressure(mmHg): 197/69 Body Mass Index(BMI): 26.9 Temperature(F): 97.5 Respiratory Rate(breaths/min): 18 [1:Photos:] [N/A:N/A] Left, Medial Lower Leg N/A N/A Wound Location: Gradually Appeared N/A N/A Wounding Event: Venous Leg Ulcer N/A N/A Primary Etiology: Cataracts, Arrhythmia, Hypertension, N/A N/A Comorbid History: Peripheral Venous Disease, Gout, Osteoarthritis 08/29/2022 N/A N/A Date Acquired: 12 N/A N/A Weeks of Treatment: Open N/A N/A Wound  Status: No N/A N/A Wound Recurrence: 2.4x1.1x0.1 N/A N/A Measurements L x W x D (cm) 2.073 N/A N/A A (cm) : rea 0.207 N/A N/A Volume (cm) : 64.70% N/A N/A % Reduction in A rea: 64.70% N/A N/A % Reduction in Volume: Full Thickness Without Exposed N/A N/A Classification: Support Structures Medium N/A N/A Exudate A mount: Serosanguineous N/A N/A Exudate Type: red, brown N/A N/A Exudate Color: Distinct, outline attached N/A N/A Wound Margin: Small (1-33%) N/A N/A Granulation A mount: Red N/A N/A Granulation Quality: Large (67-100%) N/A N/A Necrotic A mount: Fat Layer (Subcutaneous Tissue): Yes N/A N/A Exposed Structures: Fascia: No Tendon: No Muscle: No Joint: No Bone: No Small (1-33%) N/A N/A Epithelialization: Debridement - Selective/Open Wound N/A N/A Debridement: Pre-procedure Verification/Time Out 14:35 N/A N/A Taken: Lidocaine 4% Topical Solution N/A N/A Pain Control: Slough N/A N/A Tissue Debrided: Non-Viable Tissue N/A N/A Level: 2.07 N/A N/A Debridement A (sq cm): rea Curette N/A N/A Instrument: Minimum N/A N/A Bleeding: Pressure N/A N/A Hemostasis A chieved: 0 N/A N/A Procedural  Pain: 0 N/A N/A Post Procedural Pain: Procedure was tolerated well N/A N/A Debridement Treatment Response: 2.4x1.1x0.1 N/A N/A Post Debridement Measurements L x W x D (cm) 0.207 N/A N/A Post Debridement Volume: (cm) No Abnormalities Noted N/A N/A Periwound Skin Texture: No Abnormalities Noted N/A N/A Periwound Skin Moisture: No Abnormalities Noted N/A N/A Periwound Skin Color: No Abnormality N/A N/A Temperature: Yes N/A N/A Tenderness on Palpation: Debridement N/A N/A Procedures Performed: Treatment Notes Electronic Signature(s) Signed: 02/18/2023 2:40:23 PM By: Duanne Guess MD FACS Entered By: Duanne Guess on 02/18/2023 14:40:22 -------------------------------------------------------------------------------- Multi-Disciplinary Care Plan Details Patient Name: Date of Service: [name], [name] 02/18/2023 2:00 PM Medical Record Number: [id number] Patient Account Number: [id number] Date of Birth/Sex: Treating RN: Nov 14, 1940 (82 y.o. Erin [name] Primary Care : [name] Other Clinician: Referring : Treating /Extender: Sadina, Haisten ([id number]) [id number] Page 4 of 7 Weeks in Treatment: 12 Multidisciplinary Care Plan reviewed with physician Active Inactive Necrotic Tissue Nursing Diagnoses: Impaired tissue integrity related to necrotic/devitalized tissue Knowledge deficit related to management of necrotic/devitalized tissue Goals: Necrotic/devitalized tissue will be minimized in the wound bed Date Initiated: 11/26/2022 Target Resolution Date: 03/06/2023 Goal Status: Active Patient/caregiver will verbalize understanding of reason and process for debridement of necrotic tissue Date Initiated: 11/26/2022 Date Inactivated: 01/20/2023 Target Resolution Date: 03/06/2023 Goal Status: Met Interventions: Assess patient pain level pre-, during and post procedure and  prior to discharge Provide education on necrotic tissue and debridement process Treatment Activities: Apply topical anesthetic as ordered : 11/26/2022 Notes: Wound/Skin Impairment Nursing Diagnoses: Impaired tissue integrity Knowledge deficit related to ulceration/compromised skin integrity Goals: Patient/caregiver will verbalize understanding of skin care regimen Date Initiated: 11/26/2022 Target Resolution Date: 03/06/2023 Goal Status: Active Interventions: Assess ulceration(s) every visit Treatment Activities: Skin care regimen initiated : 11/26/2022 Topical wound management initiated : 11/26/2022 Notes: Electronic Signature(s) Signed: 02/18/2023 5:11:24 PM By: Zenaida Deed RN, BSN Entered By: Zenaida Deed on 02/18/2023 14:30:51 -------------------------------------------------------------------------------- Pain Assessment Details Patient Name: Date of Service: [name], [name] 02/18/2023 2:00 PM Medical Record Number: [id number] Patient Account Number: [id number] Date of Birth/Sex: Treating RN: [birth year] (82 y.o. F) Primary Care : [name] Other Clinician: Referring : Treating /Extender: [name], [name] in Treatment: 12 Active Problems Location of Pain Severity and Description of Pain Patient Has Paino No Site Locations [place], [place] ([id number]) [id number] Page 5  of 7 Site Locations Rate the pain. Current Pain Level: 0 Pain Management and Medication Current Pain Management: Electronic Signature(s) Signed: 02/18/2023 5:11:24 PM By: Zenaida Deed RN, BSN Entered By: Zenaida Deed on 02/18/2023 14:29:54 -------------------------------------------------------------------------------- Patient/Caregiver Education Details Patient Name: Date of Service: [name], [name] 8/14/2024andnbsp2:00 PM Medical Record Number: [id number] Patient Account Number: [id number] Date  of Birth/Gender: Treating RN: [birth year] (82 y.o. Erin [name] Primary Care Physician: [name] Other Clinician: Referring Physician: Treating Physician/Extender: [name] in Treatment: 12 Education Assessment Education Provided To: Patient Education Topics Provided Venous: Methods: Explain/Verbal Responses: Reinforcements needed, State content correctly Wound/Skin Impairment: Methods: Explain/Verbal Responses: Reinforcements needed, State content correctly Electronic Signature(s) Signed: 02/18/2023 5:11:24 PM By: Zenaida Deed RN, BSN Entered By: Zenaida Deed on 02/18/2023 14:31:22 Wound Assessment Details -------------------------------------------------------------------------------- Erin [name] ([id number]) [id number] Page 6 of 7 Patient Name: Date of Service: [name], [name] 02/18/2023 2:00 PM Medical Record Number: [id number] Patient Account Number: [id number] Date of Birth/Sex: Treating RN: 1941/01/07 (82 y.o. Erin [name] Primary Care : [name] Other Clinician: Referring : Treating /Extender: [name], [name] in Treatment: 12 Wound Status Wound Number: 1 Primary Venous Leg Ulcer Etiology: Wound Location: Left, Medial Lower Leg Wound Open Wounding Event: Gradually Appeared Status: Date Acquired: 08/29/2022 Comorbid Cataracts, Arrhythmia, Hypertension, Peripheral Venous Weeks Of Treatment: 12 History: Disease, Gout, Osteoarthritis Clustered Wound: No Photos Wound Measurements Length: (cm) 2.4 % Reduction in Area: 64.7% Width: (cm) 1.1 % Reduction in Volume: 64.7% Depth: (cm) 0.1 Epithelialization: Small (1-33%) Area: (cm) 2.073 Tunneling: No Volume: (cm) 0.207 Undermining: No Wound Description Classification: Full Thickness Without Exposed Support Structures Foul Odor After Cleansing: No Wound Margin:  Distinct, outline attached Slough/Fibrino Yes Exudate Amount: Medium Exudate Type: Serosanguineous Exudate Color: red, brown Wound Bed Granulation Amount: Small (1-33%) Exposed Structure Granulation Quality: Red Fascia Exposed: No Necrotic Amount: Large (67-100%) Fat Layer (Subcutaneous Tissue) Exposed: Yes Necrotic Quality: Adherent Slough Tendon Exposed: No Muscle Exposed: No Joint Exposed: No Bone Exposed: No Periwound Skin Texture Texture Color No Abnormalities Noted: Yes No Abnormalities Noted: Yes Moisture Temperature / Pain No Abnormalities Noted: Yes Temperature: No Abnormality Tenderness on Palpation: Yes Treatment Notes Wound #1 (Lower Leg) Wound Laterality: Left, Medial Cleanser Soap and Water Discharge Instruction: May shower and wash wound with dial antibacterial soap and water prior to dressing change. Wound Cleanser Discharge Instruction: Cleanse the wound with wound cleanser prior to applying a clean dressing using gauze sponges, not tissue or cotton balls. Peri-Wound Care Sween Lotion (Moisturizing lotion) Discharge Instruction: Apply moisturizing lotion as directed Erin [name], Erin [name] ([id number]) [phone].pdf Page 7 of 7 Topical Gentamicin Discharge Instruction: As directed by physician Mupirocin Ointment Discharge Instruction: Apply Mupirocin (Bactroban) as instructed Primary Dressing Promogran Prisma Matrix, 4.34 (sq in) (silver collagen) Discharge Instruction: Moisten collagen with saline or hydrogel Secondary Dressing Optifoam Non-Adhesive Dressing, 4x4 in Discharge Instruction: Apply over primary dressing as directed. Secured With Elastic Bandage 4 inch (ACE bandage) Discharge Instruction: Secure with ACE bandage as directed. Kerlix Roll Sterile, 4.5x3.1 (in/yd) Discharge Instruction: Secure with Kerlix as directed. Compression Wrap Compression Stockings Add-Ons Electronic Signature(s) Signed: 02/18/2023 5:11:24 PM By:  Zenaida Deed RN, BSN Entered By: Zenaida Deed on 02/18/2023 14:29:40 -------------------------------------------------------------------------------- Vitals Details Patient Name: Date of Service: [name], [name] 02/18/2023 2:00 PM Medical Record Number: [id number] Patient Account Number: [id number] Date of Birth/Sex: Treating RN: [birth year] (81 y.o.  F) Primary Care : [name] Other Clinician: Referring : Treating /Extender: [name], [name] in Treatment: 12 Vital Signs Time Taken: 02:08 Temperature (F): 97.5 Height (in): 60 Pulse (bpm): 72 Weight (lbs): 138 Respiratory Rate (breaths/min): 18 Body Mass Index (BMI): 26.9 Blood Pressure (mmHg): 197/69 Reference Range: 80 - 120 mg / dl Electronic Signature(s) Signed: 02/18/2023 5:11:24 PM By: Zenaida Deed RN, BSN Entered By: Zenaida Deed on 02/18/2023 14:30:09

## 2023-02-24 ENCOUNTER — Encounter (HOSPITAL_BASED_OUTPATIENT_CLINIC_OR_DEPARTMENT_OTHER): Payer: Medicare HMO | Admitting: General Surgery

## 2023-02-24 DIAGNOSIS — L97822 Non-pressure chronic ulcer of other part of left lower leg with fat layer exposed: Secondary | ICD-10-CM | POA: Diagnosis not present

## 2023-02-24 DIAGNOSIS — I129 Hypertensive chronic kidney disease with stage 1 through stage 4 chronic kidney disease, or unspecified chronic kidney disease: Secondary | ICD-10-CM | POA: Diagnosis not present

## 2023-02-24 DIAGNOSIS — I87332 Chronic venous hypertension (idiopathic) with ulcer and inflammation of left lower extremity: Secondary | ICD-10-CM | POA: Diagnosis not present

## 2023-02-24 DIAGNOSIS — I872 Venous insufficiency (chronic) (peripheral): Secondary | ICD-10-CM | POA: Diagnosis not present

## 2023-02-24 DIAGNOSIS — N183 Chronic kidney disease, stage 3 unspecified: Secondary | ICD-10-CM | POA: Diagnosis not present

## 2023-02-24 NOTE — Progress Notes (Signed)
Erin [name], Erin [name] ([id number]) [id number] Page 1 of 7 Visit Report for 8/[name]/2024 Arrival Information Details Patient Name: Date of Service: [name], [name] RBA RA N. 8/[name]/2024 2:00 PM Medical Record Number: [id number] Patient Account Number: [id number] Date of Birth/Sex: Treating RN: May [birth year], Erin [name] (82 y.o. F) Primary Care Meral Geissinger: [name] Other Clinician: Referring Jaquay Morneault: Treating Stefen Juba/Extender: [name] Weeks in Treatment: 12 Visit Information History Since Last Visit All ordered tests and consults were completed: No Patient Arrived: Ambulatory Added or deleted any medications: No Arrival Time: 14:07 Any new allergies or adverse reactions: No Accompanied By: daughter Had a fall or experienced change in No Transfer Assistance: None activities of daily living that may affect Patient Identification Verified: Yes risk of falls: Secondary Verification Process Completed: Yes Signs or symptoms of abuse/neglect since last visito No Patient Requires Transmission-Based Precautions: No Hospitalized since last visit: No Patient Has Alerts: No Implantable device outside of the clinic excluding No cellular tissue based products placed in the center since last visit: Pain Present Now: No Electronic Signature(s) Signed: 8/[name]/2024 2:38:57 PM By: Dayton Scrape Entered By: Dayton Scrape on 08/[name]/2024 11:08:07 -------------------------------------------------------------------------------- Encounter Discharge Information Details Patient Name: Date of Service: [name], [name] 8/[name]/2024 2:00 PM Medical Record Number: [id number] Patient Account Number: [id number] Date of Birth/Sex: Treating RN: Apr [birth year], Erin [name] (82 y.o. Erin [name] Primary Care Ghassan Coggeshall: [name] Other Clinician: Referring Zabdi Mis: Treating Ayla Dunigan/Extender: [name] Weeks in Treatment: 12 Encounter Discharge  Information Items Post Procedure Vitals Discharge Condition: Stable Temperature (F): 98.4 Ambulatory Status: Ambulatory Pulse (bpm): 71 Discharge Destination: Home Respiratory Rate (breaths/min): 18 Transportation: Private Auto Blood Pressure (mmHg): 198/79 Accompanied By: daughter Schedule Follow-up Appointment: Yes Clinical Summary of Care: Patient Declined Electronic Signature(s) Signed: 8/[name]/2024 4:27:52 PM By: Karie Schwalbe RN Entered By: Karie Schwalbe on 08/[name]/2024 13:25:05 Erin [name] ([id number]) [id number] Page 2 of 7 -------------------------------------------------------------------------------- Lower Extremity Assessment Details Patient Name: Date of Service: [name], [name] 8/[name]/2024 2:00 PM Medical Record Number: [id number] Patient Account Number: [id number] Date of Birth/Sex: Treating RN: November [birth year], Erin [name] (82 y.o. Erin [name] Primary Care Sara Keys: [name] Other Clinician: Referring Amayra Kiedrowski: Treating Yulonda Wheeling/Extender: [name], [name] in Treatment: 12 Edema Assessment Assessed: [Left: No] [Right: No] Edema: [Left: Ye] [Right: s] Calf Left: Right: Point of Measurement: From Medial Instep 28.5 cm Ankle Left: Right: Point of Measurement: From Medial Instep 19 cm Vascular Assessment Pulses: Dorsalis Pedis Palpable: [Left:Yes] Extremity colors, hair growth, and conditions: Extremity Color: [Left:Hyperpigmented] Hair Growth on Extremity: [Left:No] Temperature of Extremity: [Left:Warm < 3 seconds] Electronic Signature(s) Signed: 8/[name]/2024 4:27:52 PM By: Karie Schwalbe RN Entered By: Karie Schwalbe on 08/[name]/2024 11:37:28 -------------------------------------------------------------------------------- Multi Wound Chart Details Patient Name: Date of Service: [name], [name] 8/[name]/2024 2:00 PM Medical Record Number: [id number] Patient Account Number: [id number] Date of  Birth/Sex: Treating RN: Erin [name] (82 y.o. F) Primary Care Tasheema Perrone: [name] Other Clinician: Referring Kahlen Boyde: Treating Marcine Gadway/Extender: [name], [name] in Treatment: 12 Vital Signs Height(in): 60 Pulse(bpm): 71 Weight(lbs): 138 Blood Pressure(mmHg): 198/79 Body Mass Index(BMI): 26.9 Temperature(F): 98.4 Respiratory Rate(breaths/min): 18 [1:Photos:] [N/A:N/A] Left, Medial Lower Leg N/A N/A Wound Location: Gradually Appeared N/A N/A Wounding Event: Venous Leg Ulcer N/A N/A Primary Etiology: Cataracts, Arrhythmia, Hypertension, N/A N/A Comorbid History: Peripheral Venous Disease, Gout, Osteoarthritis 08/29/2022 N/A N/A Date Acquired: 12 N/A N/A Weeks of Treatment: Open N/A N/A Wound Status: No  N/A N/A Wound Recurrence: 2.3x0.9x0.1 N/A N/A Measurements L x W x D (cm) 1.626 N/A N/A A (cm) : rea 0.163 N/A N/A Volume (cm) : 72.30% N/A N/A % Reduction in A rea: 72.[name]% N/A N/A % Reduction in Volume: Full Thickness Without Exposed N/A N/A Classification: Support Structures Medium N/A N/A Exudate A mount: Serosanguineous N/A N/A Exudate Type: red, brown N/A N/A Exudate Color: Distinct, outline attached N/A N/A Wound Margin: Small (1-33%) N/A N/A Granulation A mount: Red N/A N/A Granulation Quality: Large (67-100%) N/A N/A Necrotic A mount: Fat Layer (Subcutaneous Tissue): Yes N/A N/A Exposed Structures: Fascia: No Tendon: No Muscle: No Joint: No Bone: No Small (1-33%) N/A N/A Epithelialization: Debridement - Selective/Open Wound N/A N/A Debridement: Pre-procedure Verification/Time Out 14:40 N/A N/A Taken: Lidocaine 4% Topical Solution N/A N/A Pain Control: Slough N/A N/A Tissue Debrided: Non-Viable Tissue N/A N/A Level: 1.62 N/A N/A Debridement A (sq cm): rea Curette N/A N/A Instrument: Minimum N/A N/A Bleeding: Pressure N/A N/A Hemostasis A chieved: 0 N/A N/A Procedural Pain: 0 N/A N/A Post  Procedural Pain: Procedure was tolerated well N/A N/A Debridement Treatment Response: 2.3x0.9x0.1 N/A N/A Post Debridement Measurements L x W x D (cm) 0.163 N/A N/A Post Debridement Volume: (cm) No Abnormalities Noted N/A N/A Periwound Skin Texture: No Abnormalities Noted N/A N/A Periwound Skin Moisture: No Abnormalities Noted N/A N/A Periwound Skin Color: No Abnormality N/A N/A Temperature: Yes N/A N/A Tenderness on Palpation: Debridement N/A N/A Procedures Performed: Treatment Notes Electronic Signature(s) Signed: 8/[name]/2024 2:52:55 PM By: Duanne Guess MD FACS Entered By: Duanne Guess on 08/[name]/2024 11:52:55 -------------------------------------------------------------------------------- Multi-Disciplinary Care Plan Details Patient Name: Date of Service: [name], [name] 8/[name]/2024 2:00 PM Medical Record Number: [id number] Patient Account Number: [id number] Date of Birth/Sex: Treating RN: Erin [name], Erin [name] (82 y.o. Erin [name] Primary Care Aireanna Luellen: [name] Other Clinician: Referring Arihana Ambrocio: Treating Tametra Ahart/Extender: Corneshia, Zelaya ([id number]) [id number] Page 4 of 7 Weeks in Treatment: 12 Multidisciplinary Care Plan reviewed with physician Active Inactive Necrotic Tissue Nursing Diagnoses: Impaired tissue integrity related to necrotic/devitalized tissue Knowledge deficit related to management of necrotic/devitalized tissue Goals: Necrotic/devitalized tissue will be minimized in the wound bed Date Initiated: 11/26/2022 Target Resolution Date: 05/06/2023 Goal Status: Active Patient/caregiver will verbalize understanding of reason and process for debridement of necrotic tissue Date Initiated: 11/26/2022 Date Inactivated: 01/20/2023 Target Resolution Date: 03/06/2023 Goal Status: Met Interventions: Assess patient pain level pre-, during and post procedure and prior to  discharge Provide education on necrotic tissue and debridement process Treatment Activities: Apply topical anesthetic as ordered : 11/26/2022 Notes: Wound/Skin Impairment Nursing Diagnoses: Impaired tissue integrity Knowledge deficit related to ulceration/compromised skin integrity Goals: Patient/caregiver will verbalize understanding of skin care regimen Date Initiated: 11/26/2022 Target Resolution Date: 05/06/2023 Goal Status: Active Interventions: Assess ulceration(s) every visit Treatment Activities: Skin care regimen initiated : 11/26/2022 Topical wound management initiated : 11/26/2022 Notes: Electronic Signature(s) Signed: 8/[name]/2024 4:27:52 PM By: Karie Schwalbe RN Entered By: Karie Schwalbe on 08/[name]/2024 13:23:27 -------------------------------------------------------------------------------- Pain Assessment Details Patient Name: Date of Service: [name], [name] 8/[name]/2024 2:00 PM Medical Record Number: [id number] Patient Account Number: [id number] Date of Birth/Sex: Treating RN: Erin [name] (82 y.o. F) Primary Care Keithan Dileonardo: [name] Other Clinician: Referring Jaelen Soth: Treating Jezelle Gullick/Extender: [name], [name] in Treatment: 12 Active Problems Location of Pain Severity and Description of Pain Patient Has Paino No Site Locations [place] ([id number]) [id number] Page 5 of 7 Pain  Management and Medication Current Pain Management: Electronic Signature(s) Signed: 8/[name]/2024 2:38:57 PM By: Dayton Scrape Entered By: Dayton Scrape on 08/[name]/2024 11:08:34 -------------------------------------------------------------------------------- Patient/Caregiver Education Details Patient Name: Date of Service: [name], [name] 8/[name]/2024andnbsp2:00 PM Medical Record Number: [id number] Patient Account Number: [id number] Date of Birth/Gender: Treating RN: Erin [name] (82 y.o. Erin [name] Primary Care  Physician: [name] Other Clinician: Referring Physician: Treating Physician/Extender: [name] in Treatment: 12 Education Assessment Education Provided To: Patient Education Topics Provided Wound/Skin Impairment: Methods: Explain/Verbal Responses: Return demonstration correctly Electronic Signature(s) Signed: 8/[name]/2024 4:27:52 PM By: Karie Schwalbe RN Entered By: Karie Schwalbe on 08/[name]/2024 13:23:43 -------------------------------------------------------------------------------- Wound Assessment Details Patient Name: Date of Service: [name], [name] 8/[name]/2024 2:00 PM Medical Record Number: [id number] Patient Account Number: [id number] Date of Birth/Sex: Treating RN: Erin [name] (82 y.o. F) Primary Care Sutton Hirsch: [name] Other Clinician: Referring Vivian Okelley: Treating Branndon Tuite/Extender: Erin [name], Erin [name] ([id number]) [id number] Page 6 of 7 Weeks in Treatment: 12 Wound Status Wound Number: 1 Primary Venous Leg Ulcer Etiology: Wound Location: Left, Medial Lower Leg Wound Open Wounding Event: Gradually Appeared Status: Date Acquired: 08/29/2022 Comorbid Cataracts, Arrhythmia, Hypertension, Peripheral Venous Weeks Of Treatment: 12 History: Disease, Gout, Osteoarthritis Clustered Wound: No Photos Wound Measurements Length: (cm) 2.3 Width: (cm) 0.9 Depth: (cm) 0.1 Area: (cm) 1.626 Volume: (cm) 0.163 % Reduction in Area: 72.3% % Reduction in Volume: 72.2% Epithelialization: Small (1-33%) Tunneling: No Undermining: No Wound Description Classification: Full Thickness Without Exposed Support Structures Wound Margin: Distinct, outline attached Exudate Amount: Medium Exudate Type: Serosanguineous Exudate Color: red, brown Foul Odor After Cleansing: No Slough/Fibrino Yes Wound Bed Granulation Amount: Small (1-33%) Exposed Structure Granulation  Quality: Red Fascia Exposed: No Necrotic Amount: Large (67-100%) Fat Layer (Subcutaneous Tissue) Exposed: Yes Necrotic Quality: Adherent Slough Tendon Exposed: No Muscle Exposed: No Joint Exposed: No Bone Exposed: No Periwound Skin Texture Texture Color No Abnormalities Noted: Yes No Abnormalities Noted: Yes Moisture Temperature / Pain No Abnormalities Noted: Yes Temperature: No Abnormality Tenderness on Palpation: Yes Treatment Notes Wound #1 (Lower Leg) Wound Laterality: Left, Medial Cleanser Soap and Water Discharge Instruction: May shower and wash wound with dial antibacterial soap and water prior to dressing change. Wound Cleanser Discharge Instruction: Cleanse the wound with wound cleanser prior to applying a clean dressing using gauze sponges, not tissue or cotton balls. Peri-Wound Care Sween Lotion (Moisturizing lotion) Discharge Instruction: Apply moisturizing lotion as directed Topical Gentamicin Discharge Instruction: As directed by physician Mupirocin [address] Erin [name], Erin [name] ([id number]) [id number] Page 7 of 7 Discharge Instruction: Apply Mupirocin (Bactroban) as instructed Primary Dressing Promogran Prisma Matrix, 4.34 (sq in) (silver collagen) Discharge Instruction: Moisten collagen with saline or hydrogel Secondary Dressing Optifoam Non-Adhesive Dressing, 4x4 in Discharge Instruction: Apply over primary dressing as directed. Secured With Elastic Bandage 4 inch (ACE bandage) Discharge Instruction: Secure with ACE bandage as directed. Kerlix Roll Sterile, 4.5x3.1 (in/yd) Discharge Instruction: Secure with Kerlix as directed. Compression Wrap Compression Stockings Add-Ons Electronic Signature(s) Signed: 8/[name]/2024 4:27:52 PM By: Karie Schwalbe RN Entered By: Karie Schwalbe on 08/[name]/2024 11:37:56 -------------------------------------------------------------------------------- Vitals Details Patient Name: Date of Service: NO  RMA N, [name] 8/[name]/2024 2:00 PM Medical Record Number: [id number] Patient Account Number: [id number] Date of Birth/Sex: Treating RN: Erin [name][birth year] (82 y.o. F) Primary Care Eryck Negron: [name] Other Clinician: Referring Quinlyn Tep: Treating Brytani Voth/Extender: [name], [name] in Treatment: 12 Vital Signs Time Taken: 02:08 Temperature (F):  98.4 Height (in): 60 Pulse (bpm): 71 Weight (lbs): 138 Respiratory Rate (breaths/min): 18 Body Mass Index (BMI): 26.9 Blood Pressure (mmHg): 198/79 Reference Range: 80 - 120 mg / dl Electronic Signature(s) Signed: 8/[name]/2024 2:38:57 PM By: Dayton Scrape Entered By: Dayton Scrape on 08/[name]/2024 11:08:27

## 2023-02-24 NOTE — Progress Notes (Signed)
[name], [name] ([id number]) [id number] Page 1 of 9 Visit Report for 02/24/2023 Chief Complaint Document Details Patient Name: Date of Service: [name], [name] RBA RA N. 02/24/2023 2:00 PM Medical Record Number: [id number] Patient Account Number: [id number] Date of Birth/Sex: Treating RN: September 15, 1940 (82 y.o. F) Primary Care Provider: Hoyle [name] Other Clinician: Referring Provider: Treating Provider/Extender: Erin [name] in Treatment: 12 Information Obtained from: Patient Chief Complaint Patient presents for treatment of an open ulcer due to venous insufficiency Electronic Signature(s) Signed: 02/24/2023 2:53:14 PM By: Erin Guess MD FACS Entered By: Erin [name] on 02/24/2023 11:53:14 -------------------------------------------------------------------------------- Debridement Details Patient Name: Date of Service: [name], [name] 02/24/2023 2:00 PM Medical Record Number: [id number] Patient Account Number: [id number] Date of Birth/Sex: Treating RN: 10/31/1940 (82 y.o. Katrinka Blazing Primary Care Provider: Hoyle [name] Other Clinician: Referring Provider: Treating Provider/Extender: Erin [name] in Treatment: 12 Debridement Performed for Assessment: Wound #1 Left,Medial Lower Leg Performed By: Physician Erin Guess, MD Debridement Type: Debridement Severity of Tissue Pre Debridement: Fat layer exposed Level of Consciousness (Pre-procedure): Awake and Alert Pre-procedure Verification/Time Out Yes - 14:40 Taken: Start Time: 14:40 Pain Control: Lidocaine 4% T opical Solution Percent of Wound Bed Debrided: 100% T Area Debrided (cm): otal 1.62 Tissue and other material debrided: Non-Viable, Slough, Slough Level: Non-Viable Tissue Debridement Description: Selective/Open Wound Instrument: Curette Bleeding: Minimum Hemostasis Achieved: Pressure End Time:  14:42 Procedural Pain: 0 Post Procedural Pain: 0 Response to Treatment: Procedure was tolerated well Level of Consciousness (Post- Awake and Alert procedure): Post Debridement Measurements of Total Wound Length: (cm) 2.3 Width: (cm) 0.9 Depth: (cm) 0.1 Volume: (cm) 0.163 Character of Wound/Ulcer Post Debridement: Improved Erin [name], Erin [name] ([id number]) [id number] Page 2 of 9 Severity of Tissue Post Debridement: Fat layer exposed Post Procedure Diagnosis Same as Pre-procedure Notes Scribed for Dr. Lady Gary by Erin [name] Electronic Signature(s) Signed: 02/24/2023 4:17:12 PM By: Erin Guess MD FACS Signed: 02/24/2023 4:27:52 PM By: Erin Schwalbe RN Entered By: Erin [name] on 02/24/2023 11:46:16 -------------------------------------------------------------------------------- HPI Details Patient Name: Date of Service: [name], [name] 02/24/2023 2:00 PM Medical Record Number: [id number] Patient Account Number: [id number] Date of Birth/Sex: Treating RN: [birth year] (82 y.o. F) Primary Care Provider: Hoyle [name] Other Clinician: Referring Provider: Treating Provider/Extender: Erin [name] in Treatment: 12 History of Present Illness HPI Description: ADMISSION 11/26/2022 This is an 82 year old woman who was referred by her primary care provider for a persistent nonhealing wound on her left lower extremity. It has been present since February of this year. 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 I do not see any formal venous reflux studies in the electronic medical record. She is not diabetic. She does not smoke. Her PCP has been washing her leg each week and applying a Kerlix and Coban wrap, but has not performed any formal debridement and has not been using any sort of topical contact layer or ointment. ABI in clinic  was noncompressible, but she has a palpable pedal pulse. 12/10/2022: The wound is a bit smaller and cleaner today. There is still fairly heavy slough on the surface. Edema control is acceptable. 12/16/2022: The wound is measuring the slightest bit smaller today. There is still thick slough on the surface. Edema control is good. 12/23/2022: The wound is a little bit smaller again today. Still with fairly  heavy slough accumulation. Edema control is good. She has a new small wound on the proximal portion of the same leg. It looks as though she may have developed a small blister over the weekend subsequently ruptured. There is minimal slough on the surface. 12/30/2022: The wound is about the same size, but substantially cleaner. There is some hypertrophic granulation tissue starting to emerge. The wound that was new last week has closed but she has a different new wound today on the lateral aspect of her left lower leg. It also appears to have been a blister that opened and ruptured. There is a little bit of slough on the surface. 01/06/2023: She has new wounds today. She has a wound on the PIP knuckle of her left third and fourth toes, as well as an anterior tibial wound. She says that the wrap was too tight and it also looks like when she pushes her foot into her shoe, it shifts the wrapping back to her midfoot causing bunching up of the wrap material and pain. The lateral leg wounds are both smaller with a little slough and eschar present. The original medial leg wound is smaller and more superficial. There is slough on the surface. 01/13/2023: She has new wounds today. The anterior tibial wound has a satellite lesion that has opened up adjacent to it. Has additional wounds on her fifth toe in addition to the existing ones on her third and fourth toe. The lateral leg wounds seem to have healed. The original medial leg wound is also smaller, but she and her daughter have several questions regarding why we are  not making forward progress, all of which are very reasonable. 01/20/2023: The wounds on her toes have healed. There is a tiny wound on the dorsum of her foot with a little bit of slough on it. The anterolateral leg wound has epithelialized quite substantially and the satellite that had opened last week is now closed. The medial leg wound measured narrower today. It has hypertrophic granulation tissue and slough present. 01/27/2023: The small wounds have all healed. The medial leg wound is smaller as well, with some slough on the surface but without reaccumulation of the hypertrophic granulation tissue. 02/03/2023: The only remaining open wound is the left medial leg wound. It is smaller and a bit more superficial. There is still slough accumulation on the surface. No hypertrophic granulation tissue. She had both her venous reflux and lower extremity arterial studies, both of which were essentially normal, although her TBI's are slightly diminished. 02/10/2023: The left medial leg wound has some granulation tissue filling in, but overall, it is still fairly fibrotic. There is some slough on the surface today. 02/18/2023: The moisture balance and surface consistency have both improved. There is some slough over granulation tissue. Edema control is good. 02/24/2023: The wound measured smaller today. Moisture balance is good. Slough has reaccumulated. Edema remains well-controlled. Electronic Signature(s) Signed: 02/24/2023 2:54:08 PM By: Erin Guess MD FACS Erin [name] ([id number]) [id number] Page 3 of 9 Entered By: Erin [name] on 02/24/2023 11:54:07 -------------------------------------------------------------------------------- Physical Exam Details Patient Name: Date of Service: [name], [name] 02/24/2023 2:00 PM Medical Record Number: [id number] Patient Account Number: [id number] Date of Birth/Sex: Treating RN: Aug 30, 1940 (82 y.o. F) Primary Care  Provider: Hoyle [name] Other Clinician: Referring Provider: Treating Provider/Extender: Erin [name], Erin [name] in Treatment: 12 Constitutional Hypertensive, asymptomatic. . . . no acute distress. Respiratory Normal work of breathing on room air. Notes 02/24/2023:  The wound measured smaller today. Moisture balance is good. Slough has reaccumulated. Edema remains well-controlled. Electronic Signature(s) Signed: 02/24/2023 2:54:42 PM By: Erin Guess MD FACS Entered By: Erin [name] on 02/24/2023 11:54:42 -------------------------------------------------------------------------------- Physician Orders Details Patient Name: Date of Service: [name], [name] 02/24/2023 2:00 PM Medical Record Number: [id number] Patient Account Number: [id number] Date of Birth/Sex: Treating RN: 1941/06/23 (82 y.o. Katrinka Blazing Primary Care Provider: Hoyle [name] Other Clinician: Referring Provider: Treating Provider/Extender: Erin [name] in Treatment: 85 Verbal / Phone Orders: No Diagnosis Coding ICD-10 Coding Code Description [phone] Non-pressure chronic ulcer of other part of left lower leg with fat layer exposed I87.332 Chronic venous hypertension (idiopathic) with ulcer and inflammation of left lower extremity I10 Essential (primary) hypertension Follow-up Appointments ppointment in 1 week. - Dr. Lady Gary - room 1 Return A Tuesday 8/27@ 2:00 pm Anesthetic (In clinic) Topical Lidocaine 4% applied to wound bed Bathing/ Shower/ Hygiene May shower with protection but do not get wound dressing(s) wet. Protect dressing(s) with water repellant cover (for example, large plastic bag) or a cast cover and may then take shower. Edema Control - Lymphedema / SCD / Other Elevate legs to the level of the heart or above for 30 minutes daily and/or when sitting for 3-4 times a day throughout the day. Avoid standing for long periods of  time. Exercise regularly Wound Treatment Erin [name], Erin [name] ([id number]) [id number] Page 4 of 9 Wound #1 - Lower Leg Wound Laterality: Left, Medial Cleanser: Soap and Water 1 x Per Week/30 Days Discharge Instructions: May shower and wash wound with dial antibacterial soap and water prior to dressing change. Cleanser: Wound Cleanser 1 x Per Week/30 Days Discharge Instructions: Cleanse the wound with wound cleanser prior to applying a clean dressing using gauze sponges, not tissue or cotton balls. Peri-Wound Care: Sween Lotion (Moisturizing lotion) 1 x Per Week/30 Days Discharge Instructions: Apply moisturizing lotion as directed Topical: Gentamicin 1 x Per Week/30 Days Discharge Instructions: As directed by physician Topical: Mupirocin Ointment 1 x Per Week/30 Days Discharge Instructions: Apply Mupirocin (Bactroban) as instructed Prim Dressing: Promogran Prisma Matrix, 4.34 (sq in) (silver collagen) 1 x Per Week/30 Days ary Discharge Instructions: Moisten collagen with saline or hydrogel Secondary Dressing: Optifoam Non-Adhesive Dressing, 4x4 in 1 x Per Week/30 Days Discharge Instructions: Apply over primary dressing as directed. Secured With: Elastic Bandage 4 inch (ACE bandage) 1 x Per Week/30 Days Discharge Instructions: Secure with ACE bandage as directed. Secured With: [employer], 4.5x3.1 (in/yd) 1 x Per Week/30 Days Discharge Instructions: Secure with Kerlix as directed. Electronic Signature(s) Signed: 02/24/2023 4:17:12 PM By: Erin Guess MD FACS Entered By: Erin [name] on 02/24/2023 11:54:57 -------------------------------------------------------------------------------- Problem List Details Patient Name: Date of Service: [name], [name] 02/24/2023 2:00 PM Medical Record Number: [id number] Patient Account Number: [id number] Date of Birth/Sex: Treating RN: 02-Feb-1941 (82 y.o. F) Primary Care Provider: Hoyle [name] Other  Clinician: Referring Provider: Treating Provider/Extender: Erin [name] in Treatment: 12 Active Problems ICD-10 Encounter Code Description Active Date MDM Diagnosis L97.822 Non-pressure chronic ulcer of other part of left lower leg with fat layer exposed5/22/2024 No Yes I87.332 Chronic venous hypertension (idiopathic) with ulcer and inflammation of left 11/26/2022 No Yes lower extremity I10 Essential (primary) hypertension 11/26/2022 No Yes Inactive Problems Resolved Problems ICD-10 Erin [name], Erin [name] ([id number]) [id number] Page 5 of 9 Code Description Active Date Resolved Date L97.522 Non-pressure chronic ulcer of other part of  left foot with fat layer exposed 01/13/2023 01/13/2023 Electronic Signature(s) Signed: 02/24/2023 2:52:41 PM By: Erin Guess MD FACS Entered By: Erin [name] on 02/24/2023 11:52:41 -------------------------------------------------------------------------------- Progress Note Details Patient Name: Date of Service: [name], [name] 02/24/2023 2:00 PM Medical Record Number: [id number] Patient Account Number: [id number] Date of Birth/Sex: Treating RN: Mar 01, 1941 (82 y.o. F) Primary Care Provider: Hoyle [name] Other Clinician: Referring Provider: Treating Provider/Extender: Erin [name] in Treatment: 12 Subjective Chief Complaint Information obtained from Patient Patient presents for treatment of an open ulcer due to venous insufficiency History of Present Illness (HPI) ADMISSION 11/26/2022 This is an 82 year old woman who was referred by her primary care provider for a persistent nonhealing wound on her left lower extremity. It has been present since February of this year. 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 I do not see any formal venous reflux studies in the  electronic medical record. She is not diabetic. She does not smoke. Her PCP has been washing her leg each week and applying a Kerlix and Coban wrap, but has not performed any formal debridement and has not been using any sort of topical contact layer or ointment. ABI in clinic was noncompressible, but she has a palpable pedal pulse. 12/10/2022: The wound is a bit smaller and cleaner today. There is still fairly heavy slough on the surface. Edema control is acceptable. 12/16/2022: The wound is measuring the slightest bit smaller today. There is still thick slough on the surface. Edema control is good. 12/23/2022: The wound is a little bit smaller again today. Still with fairly heavy slough accumulation. Edema control is good. She has a new small wound on the proximal portion of the same leg. It looks as though she may have developed a small blister over the weekend subsequently ruptured. There is minimal slough on the surface. 12/30/2022: The wound is about the same size, but substantially cleaner. There is some hypertrophic granulation tissue starting to emerge. The wound that was new last week has closed but she has a different new wound today on the lateral aspect of her left lower leg. It also appears to have been a blister that opened and ruptured. There is a little bit of slough on the surface. 01/06/2023: She has new wounds today. She has a wound on the PIP knuckle of her left third and fourth toes, as well as an anterior tibial wound. She says that the wrap was too tight and it also looks like when she pushes her foot into her shoe, it shifts the wrapping back to her midfoot causing bunching up of the wrap material and pain. The lateral leg wounds are both smaller with a little slough and eschar present. The original medial leg wound is smaller and more superficial. There is slough on the surface. 01/13/2023: She has new wounds today. The anterior tibial wound has a satellite lesion that has opened up  adjacent to it. Has additional wounds on her fifth toe in addition to the existing ones on her third and fourth toe. The lateral leg wounds seem to have healed. The original medial leg wound is also smaller, but she and her daughter have several questions regarding why we are not making forward progress, all of which are very reasonable. 01/20/2023: The wounds on her toes have healed. There is a tiny wound on the dorsum of her foot with a little  bit of slough on it. The anterolateral leg wound has epithelialized quite substantially and the satellite that had opened last week is now closed. The medial leg wound measured narrower today. It has hypertrophic granulation tissue and slough present. 01/27/2023: The small wounds have all healed. The medial leg wound is smaller as well, with some slough on the surface but without reaccumulation of the hypertrophic granulation tissue. 02/03/2023: The only remaining open wound is the left medial leg wound. It is smaller and a bit more superficial. There is still slough accumulation on the surface. No hypertrophic granulation tissue. She had both her venous reflux and lower extremity arterial studies, both of which were essentially normal, although her TBI's are slightly diminished. 02/10/2023: The left medial leg wound has some granulation tissue filling in, but overall, it is still fairly fibrotic. There is some slough on the surface today. 02/18/2023: The moisture balance and surface consistency have both improved. There is some slough over granulation tissue. Edema control is good. 02/24/2023: The wound measured smaller today. Moisture balance is good. Slough has reaccumulated. Edema remains well-controlled. Patient History Family History Cancer - Siblings, Lung Disease - Siblings, Erin [name], Erin [name] ([id number]) [id number] Page 6 of 9 No family history of Diabetes, Heart Disease, Hereditary Spherocytosis, Hypertension, Kidney Disease,  Seizures, Stroke, Thyroid Problems, Tuberculosis. Social History Never smoker, Marital Status - Widowed, Alcohol Use - Never, Drug Use - No History, Caffeine Use - Moderate. Medical History Eyes Patient has history of Cataracts Cardiovascular Patient has history of Arrhythmia - bundle branch block, Hypertension, Peripheral Venous Disease Musculoskeletal Patient has history of Gout, Osteoarthritis Hospitalization/Surgery History - Salpingoophorectomy. - Colon resection. - Appendectomy. - Cystoscopy w/ retrogrades. - Joint replacement. - Mastectomy right. - gastric ulcer repair. - Abdominal hysterectomy. Medical A Surgical History Notes nd Hematologic/Lymphatic thrombocytopenia Gastrointestinal diverticulosis, GERD Endocrine hypothyroidism Genitourinary CKD stage III Musculoskeletal DJD, osteoporosis Oncologic breast cancer Objective Constitutional Hypertensive, asymptomatic. no acute distress. Vitals Time Taken: 2:08 AM, Height: 60 in, Weight: 138 lbs, BMI: 26.9, Temperature: 98.4 F, Pulse: 71 bpm, Respiratory Rate: 18 breaths/min, Blood Pressure: 198/79 mmHg. Respiratory Normal work of breathing on room air. General Notes: 02/24/2023: The wound measured smaller today. Moisture balance is good. Slough has reaccumulated. Edema remains well-controlled. Integumentary (Hair, Skin) Wound #1 status is Open. Original cause of wound was Gradually Appeared. The date acquired was: 08/29/2022. The wound has been in treatment 12 [name]. The wound is located on the Left,Medial Lower Leg. The wound measures 2.3cm length x 0.9cm width x 0.1cm depth; 1.626cm^2 area and 0.163cm^3 volume. There is Fat Layer (Subcutaneous Tissue) exposed. There is no tunneling or undermining noted. There is a medium amount of serosanguineous drainage noted. The wound margin is distinct with the outline attached to the wound base. There is small (1-33%) red granulation within the wound bed. There is a large  (67-100%) amount of necrotic tissue within the wound bed including Adherent Slough. The periwound skin appearance had no abnormalities noted for texture. The periwound skin appearance had no abnormalities noted for moisture. The periwound skin appearance had no abnormalities noted for color. Periwound temperature was noted as No Abnormality. The periwound has tenderness on palpation. Assessment Active Problems ICD-10 Non-pressure chronic ulcer of other part of left lower leg with fat layer exposed Chronic venous hypertension (idiopathic) with ulcer and inflammation of left lower extremity Essential (primary) hypertension Procedures Wound #1 Pre-procedure diagnosis of Wound #1 is a Venous Leg Ulcer located on the Left,Medial Lower Leg .Severity of Tissue Pre Debridement  is: Fat layer exposed. There was a Selective/Open Wound Non-Viable Tissue Debridement with a total area of 1.62 sq cm performed by Erin Guess, MD. With the following instrument(s): Curette to remove Non-Viable tissue/material. Material removed includes [facility] after achieving pain control using Lidocaine 4% Topical Solution. No specimens were taken. A time out was conducted at 14:40, prior to the start of the procedure. A Minimum amount of bleeding was controlled with Pressure. Erin [name], Erin [name] ([id number]) [id number] Page 7 of 9 The procedure was tolerated well with a pain level of 0 throughout and a pain level of 0 following the procedure. Post Debridement Measurements: 2.3cm length x 0.9cm width x 0.1cm depth; 0.163cm^3 volume. Character of Wound/Ulcer Post Debridement is improved. Severity of Tissue Post Debridement is: Fat layer exposed. Post procedure Diagnosis Wound #1: Same as Pre-Procedure General Notes: Scribed for Dr. Lady Gary by Erin [name]. Plan Follow-up Appointments: Return Appointment in 1 week. - Dr. Lady Gary - room 1 Tuesday 8/27@ 2:00 pm Anesthetic: (In clinic) Topical Lidocaine  4% applied to wound bed Bathing/ Shower/ Hygiene: May shower with protection but do not get wound dressing(s) wet. Protect dressing(s) with water repellant cover (for example, large plastic bag) or a cast cover and may then take shower. Edema Control - Lymphedema / SCD / Other: Elevate legs to the level of the heart or above for 30 minutes daily and/or when sitting for 3-4 times a day throughout the day. Avoid standing for long periods of time. Exercise regularly WOUND #1: - Lower Leg Wound Laterality: Left, Medial Cleanser: Soap and Water 1 x Per Week/30 Days Discharge Instructions: May shower and wash wound with dial antibacterial soap and water prior to dressing change. Cleanser: Wound Cleanser 1 x Per Week/30 Days Discharge Instructions: Cleanse the wound with wound cleanser prior to applying a clean dressing using gauze sponges, not tissue or cotton balls. Peri-Wound Care: Sween Lotion (Moisturizing lotion) 1 x Per Week/30 Days Discharge Instructions: Apply moisturizing lotion as directed Topical: Gentamicin 1 x Per Week/30 Days Discharge Instructions: As directed by physician Topical: Mupirocin Ointment 1 x Per Week/30 Days Discharge Instructions: Apply Mupirocin (Bactroban) as instructed Prim Dressing: Promogran Prisma Matrix, 4.34 (sq in) (silver collagen) 1 x Per Week/30 Days ary Discharge Instructions: Moisten collagen with saline or hydrogel Secondary Dressing: Optifoam Non-Adhesive Dressing, 4x4 in 1 x Per Week/30 Days Discharge Instructions: Apply over primary dressing as directed. Secured With: Elastic Bandage 4 inch (ACE bandage) 1 x Per Week/30 Days Discharge Instructions: Secure with ACE bandage as directed. Secured With: [employer], 4.5x3.1 (in/yd) 1 x Per Week/30 Days Discharge Instructions: Secure with Kerlix as directed. 02/24/2023: The wound measured smaller today. Moisture balance is good. Slough has reaccumulated. Edema remains well-controlled. I used a  curette to debride the slough from her wound. We will continue the mixture of topical gentamicin and mupirocin with Prisma silver collagen. Optifoam covering the wound to hold moisture in, Kerlix and Ace wrap. Follow-up in 1 week. Electronic Signature(s) Signed: 02/24/2023 2:55:40 PM By: Erin Guess MD FACS Entered By: Erin [name] on 02/24/2023 11:55:40 -------------------------------------------------------------------------------- HxROS Details Patient Name: Date of Service: [name], [name] 02/24/2023 2:00 PM Medical Record Number: [id number] Patient Account Number: [id number] Date of Birth/Sex: Treating RN: 19-Nov-1940 (82 y.o. F) Primary Care Provider: Hoyle [name] Other Clinician: Referring Provider: Treating Provider/Extender: Erin [name], Erin [name] in Treatment: 12 Eyes Medical History: Positive for: Cataracts Hematologic/Lymphatic Medical History: Past Medical History Notes: Erin [name], Erin [name] ([id number]) [id number] Page  8 of 9 thrombocytopenia Cardiovascular Medical History: Positive for: Arrhythmia - bundle branch block; Hypertension; Peripheral Venous Disease Gastrointestinal Medical History: Past Medical History Notes: diverticulosis, GERD Endocrine Medical History: Past Medical History Notes: hypothyroidism Genitourinary Medical History: Past Medical History Notes: CKD stage III Musculoskeletal Medical History: Positive for: Gout; Osteoarthritis Past Medical History Notes: DJD, osteoporosis Oncologic Medical History: Past Medical History Notes: breast cancer HBO Extended History Items Eyes: Cataracts Immunizations Pneumococcal Vaccine: Received Pneumococcal Vaccination: Yes Received Pneumococcal Vaccination On or After 60th Birthday: No Implantable Devices No devices added Hospitalization / Surgery History Type of Hospitalization/Surgery Salpingoophorectomy Colon  resection Appendectomy Cystoscopy w/ retrogrades Joint replacement Mastectomy right gastric ulcer repair Abdominal hysterectomy Family and Social History Cancer: Yes - Siblings; Diabetes: No; Heart Disease: No; Hereditary Spherocytosis: No; Hypertension: No; Kidney Disease: No; Lung Disease: Yes - Siblings; Seizures: No; Stroke: No; Thyroid Problems: No; Tuberculosis: No; Never smoker; Marital Status - Widowed; Alcohol Use: Never; Drug Use: No History; Caffeine Use: Moderate; Financial Concerns: No; Food, Clothing or Shelter Needs: No; Support System Lacking: No; Transportation Concerns: No Electronic Signature(s) Signed: 02/24/2023 4:17:12 PM By: Erin Guess MD FACS Entered By: Erin [name] on 02/24/2023 11:54:18 Erin [name] ([id number]) [id number] Page 9 of 9 -------------------------------------------------------------------------------- SuperBill Details Patient Name: Date of Service: [name], [name] RBA RA N. 02/24/2023 Medical Record Number: [id number] Patient Account Number: [id number] Date of Birth/Sex: Treating RN: 1940/07/24 (82 y.o. 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 I10 Essential (primary) hypertension Facility Procedures : CPT4 Code: [id number] Description: 97597 - DEBRIDE WOUND 1ST 20 SQ CM OR < ICD-10 Diagnosis Description L97.822 Non-pressure chronic ulcer of other part of left lower leg with fat layer exposed Modifier: Quantity: 1 Physician Procedures : CPT4 Code Description Modifier [id number] - WC PHYS LEVEL 4 - EST PT 25 ICD-10 Diagnosis Description  L97.822 Non-pressure chronic ulcer of other part of left lower leg with fat layer exposed I87.332 Chronic venous hypertension (idiopathic) with  ulcer and inflammation of left lower extremity I10 Essential (primary) hypertension Quantity: 1 : [id number] - WC PHYS DEBR WO ANESTH 20 SQ CM ICD-10 Diagnosis Description L97.822 Non-pressure chronic ulcer of other part of left lower leg with fat layer exposed Quantity: 1 Electronic Signature(s) Signed: 02/24/2023 2:56:05 PM By: Erin Guess MD FACS Entered By: Erin [name] on 02/24/2023 11:56:05

## 2023-03-03 ENCOUNTER — Encounter (HOSPITAL_BASED_OUTPATIENT_CLINIC_OR_DEPARTMENT_OTHER): Payer: Medicare HMO | Admitting: General Surgery

## 2023-03-03 DIAGNOSIS — I872 Venous insufficiency (chronic) (peripheral): Secondary | ICD-10-CM | POA: Diagnosis not present

## 2023-03-03 DIAGNOSIS — L97822 Non-pressure chronic ulcer of other part of left lower leg with fat layer exposed: Secondary | ICD-10-CM | POA: Diagnosis not present

## 2023-03-03 DIAGNOSIS — I129 Hypertensive chronic kidney disease with stage 1 through stage 4 chronic kidney disease, or unspecified chronic kidney disease: Secondary | ICD-10-CM | POA: Diagnosis not present

## 2023-03-03 DIAGNOSIS — N183 Chronic kidney disease, stage 3 unspecified: Secondary | ICD-10-CM | POA: Diagnosis not present

## 2023-03-03 DIAGNOSIS — I87332 Chronic venous hypertension (idiopathic) with ulcer and inflammation of left lower extremity: Secondary | ICD-10-CM | POA: Diagnosis not present

## 2023-03-03 NOTE — Progress Notes (Signed)
[name], [name] ([id number]) [id number] Page 1 of 9 Visit Report for 03/03/2023 Chief Complaint Document Details Patient Name: Date of Service: [name], [name] RBA RA N. 03/03/2023 2:00 PM Medical Record Number: [id number] Patient Account Number: [id number] Date of Birth/Sex: Treating RN: 07-23-1940 (82 y.o. Erin [name] Standard Primary Care Provider: [name] Other Clinician: Referring Provider: Treating Provider/Extender: [name] Weeks in Treatment: 13 Information Obtained from: Patient Chief Complaint Patient presents for treatment of an open ulcer due to venous insufficiency Electronic Signature(s) Signed: 03/03/2023 2:24:53 PM By: Duanne Guess MD FACS Entered By: Duanne Guess on 03/03/2023 14:24:53 -------------------------------------------------------------------------------- Debridement Details Patient Name: Date of Service: [name], [name] 03/03/2023 2:00 PM Medical Record Number: [id number] Patient Account Number: [id number] Date of Birth/Sex: Treating RN: 12-28-1940 (82 y.o. Erin [name] Standard Primary Care Provider: [name] Other Clinician: Referring Provider: Treating Provider/Extender: [name] Weeks in Treatment: 13 Debridement Performed for Assessment: Wound #1 Left,Medial Lower Leg Performed By: Physician Duanne Guess, MD Debridement Type: Debridement Severity of Tissue Pre Debridement: Fat layer exposed Level of Consciousness (Pre-procedure): Awake and Alert Pre-procedure Verification/Time Out Yes - 14:10 Taken: Start Time: 14:10 Pain Control: Lidocaine 4% Topical Solution Percent of Wound Bed Debrided: 100% T Area Debrided (cm): otal 1.32 Tissue and other material debrided: Non-Viable, Eschar, Slough, Slough Level: Non-Viable Tissue Debridement Description: Selective/Open Wound Instrument: Curette Bleeding: Minimum Hemostasis Achieved:  Pressure Procedural Pain: 2 Post Procedural Pain: 0 Response to Treatment: Procedure was tolerated well Level of Consciousness (Post- Awake and Alert procedure): Post Debridement Measurements of Total Wound Length: (cm) 2.1 Width: (cm) 0.8 Depth: (cm) 0.1 Volume: (cm) 0.132 Character of Wound/Ulcer Post Debridement: Improved Severity of Tissue Post Debridement: Fat layer exposed Erin [name], Erin [name] ([id number]) [id number] Page 2 of 9 Post Procedure Diagnosis Same as Pre-procedure Electronic Signature(s) Signed: 03/03/2023 4:13:25 PM By: Duanne Guess MD FACS Signed: 03/03/2023 4:18:27 PM By: Zenaida Deed RN, BSN Entered By: Zenaida Deed on 03/03/2023 14:13:15 -------------------------------------------------------------------------------- HPI Details Patient Name: Date of Service: [name], [name] 03/03/2023 2:00 PM Medical Record Number: [id number] Patient Account Number: [id number] Date of Birth/Sex: Treating RN: 1941-05-22 (82 y.o. Erin [name] Standard Primary Care Provider: [name] Other Clinician: Referring Provider: Treating Provider/Extender: [name] Weeks in Treatment: 13 History of Present Illness HPI Description: ADMISSION 11/26/2022 This is an 82 year old woman who was referred by her primary care provider for a persistent nonhealing wound on her left lower extremity. It has been present since February of this year. 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 I do not see any formal venous reflux studies in the electronic medical record. She is not diabetic. She does not smoke. Her PCP has been washing her leg each week and applying a Kerlix and Coban wrap, but has not performed any formal debridement and has not been using any sort of topical contact layer or ointment. ABI in clinic was noncompressible,  but she has a palpable pedal pulse. 12/10/2022: The wound is a bit smaller and cleaner today. There is still fairly heavy slough on the surface. Edema control is acceptable. 12/16/2022: The wound is measuring the slightest bit smaller today. There is still thick slough on the surface. Edema control is good. 12/23/2022: The wound is a little bit smaller again today. Still with fairly heavy slough accumulation. Edema control  is good. She has a new small wound on the proximal portion of the same leg. It looks as though she may have developed a small blister over the weekend subsequently ruptured. There is minimal slough on the surface. 12/30/2022: The wound is about the same size, but substantially cleaner. There is some hypertrophic granulation tissue starting to emerge. The wound that was new last week has closed but she has a different new wound today on the lateral aspect of her left lower leg. It also appears to have been a blister that opened and ruptured. There is a little bit of slough on the surface. 01/06/2023: She has new wounds today. She has a wound on the PIP knuckle of her left third and fourth toes, as well as an anterior tibial wound. She says that the wrap was too tight and it also looks like when she pushes her foot into her shoe, it shifts the wrapping back to her midfoot causing bunching up of the wrap material and pain. The lateral leg wounds are both smaller with a little slough and eschar present. The original medial leg wound is smaller and more superficial. There is slough on the surface. 01/13/2023: She has new wounds today. The anterior tibial wound has a satellite lesion that has opened up adjacent to it. Has additional wounds on her fifth toe in addition to the existing ones on her third and fourth toe. The lateral leg wounds seem to have healed. The original medial leg wound is also smaller, but she and her daughter have several questions regarding why we are not making forward  progress, all of which are very reasonable. 01/20/2023: The wounds on her toes have healed. There is a tiny wound on the dorsum of her foot with a little bit of slough on it. The anterolateral leg wound has epithelialized quite substantially and the satellite that had opened last week is now closed. The medial leg wound measured narrower today. It has hypertrophic granulation tissue and slough present. 01/27/2023: The small wounds have all healed. The medial leg wound is smaller as well, with some slough on the surface but without reaccumulation of the hypertrophic granulation tissue. 02/03/2023: The only remaining open wound is the left medial leg wound. It is smaller and a bit more superficial. There is still slough accumulation on the surface. No hypertrophic granulation tissue. She had both her venous reflux and lower extremity arterial studies, both of which were essentially normal, although her TBI's are slightly diminished. 02/10/2023: The left medial leg wound has some granulation tissue filling in, but overall, it is still fairly fibrotic. There is some slough on the surface today. 02/18/2023: The moisture balance and surface consistency have both improved. There is some slough over granulation tissue. Edema control is good. 02/24/2023: The wound measured smaller today. Moisture balance is good. Slough has reaccumulated. Edema remains well-controlled. 03/03/2023: The wound is a little bit smaller today. Moisture balance is stable. Minimal slough accumulation. There is more granulation tissue filling in over the fibrotic surface. Edema is well-controlled. Electronic Signature(s) Signed: 03/03/2023 2:32:41 PM By: Duanne Guess MD FACS Previous Signature: 03/03/2023 2:30:02 PM Version By: Duanne Guess MD FACS Entered By: Duanne Guess on 03/03/2023 14:32:40 [name] ([id number]) [id number] Page 3 of  9 -------------------------------------------------------------------------------- Physical Exam Details Patient Name: Date of Service: [name], [name] 03/03/2023 2:00 PM Medical Record Number: [id number] Patient Account Number: [id number] Date of Birth/Sex: Treating RN: 1940-08-19 (82 y.o. [name], [facility]  Provider: [name] Other Clinician: Referring Provider: Treating Provider/Extender: [name], [name] in Treatment: 13 Constitutional . . . . no acute distress. Respiratory Normal work of breathing on room air. Notes 03/03/2023: The wound is a little bit smaller today. Moisture balance is stable. Minimal slough accumulation. There is more granulation tissue filling in over the fibrotic surface. Edema is well-controlled. Electronic Signature(s) Signed: 03/03/2023 2:43:05 PM By: Duanne Guess MD FACS Entered By: Duanne Guess on 03/03/2023 14:43:05 -------------------------------------------------------------------------------- Physician Orders Details Patient Name: Date of Service: [name], [name] 03/03/2023 2:00 PM Medical Record Number: [id number] Patient Account Number: [id number] Date of Birth/Sex: Treating RN: 1941-01-02 (82 y.o. Erin [name] Standard Primary Care Provider: [name] Other Clinician: Referring Provider: Treating Provider/Extender: [name] in Treatment: 82 Verbal / Phone Orders: No Diagnosis Coding ICD-10 Coding Code Description [phone] Non-pressure chronic ulcer of other part of left lower leg with fat layer exposed I87.332 Chronic venous hypertension (idiopathic) with ulcer and inflammation of left lower extremity I10 Essential (primary) hypertension Follow-up Appointments ppointment in 1 week. - Dr. Lady Gary - room 1 Return A Tuesday 9/3 @ 2:00 pm Anesthetic (In clinic) Topical Lidocaine 4% applied to wound bed Bathing/ Shower/ Hygiene May  shower with protection but do not get wound dressing(s) wet. Protect dressing(s) with water repellant cover (for example, large plastic bag) or a cast cover and may then take shower. Edema Control - Lymphedema / SCD / Other Elevate legs to the level of the heart or above for 30 minutes daily and/or when sitting for 3-4 times a day throughout the day. Avoid standing for long periods of time. Exercise regularly Wound Treatment RASCHEL, BUDNER ([id number]) [id number] Page 4 of 9 Wound #1 - Lower Leg Wound Laterality: Left, Medial Cleanser: Soap and Water 1 x Per Week/30 Days Discharge Instructions: May shower and wash wound with dial antibacterial soap and water prior to dressing change. Cleanser: Wound Cleanser 1 x Per Week/30 Days Discharge Instructions: Cleanse the wound with wound cleanser prior to applying a clean dressing using gauze sponges, not tissue or cotton balls. Peri-Wound Care: Sween Lotion (Moisturizing lotion) 1 x Per Week/30 Days Discharge Instructions: Apply moisturizing lotion as directed Topical: Gentamicin 1 x Per Week/30 Days Discharge Instructions: As directed by physician Topical: Mupirocin Ointment 1 x Per Week/30 Days Discharge Instructions: Apply Mupirocin (Bactroban) as instructed Prim Dressing: Promogran Prisma Matrix, 4.34 (sq in) (silver collagen) 1 x Per Week/30 Days ary Discharge Instructions: Moisten collagen with saline or hydrogel Secondary Dressing: Optifoam Non-Adhesive Dressing, 4x4 in 1 x Per Week/30 Days Discharge Instructions: Apply over primary dressing as directed. Secured With: Elastic Bandage 4 inch (ACE bandage) 1 x Per Week/30 Days Discharge Instructions: Secure with ACE bandage as directed. Secured With: [employer], 4.5x3.1 (in/yd) 1 x Per Week/30 Days Discharge Instructions: Secure with Kerlix as directed. Electronic Signature(s) Signed: 03/03/2023 4:13:25 PM By: Duanne Guess MD FACS Entered By:  Duanne Guess on 03/03/2023 14:43:33 -------------------------------------------------------------------------------- Problem List Details Patient Name: Date of Service: [name], [name] 03/03/2023 2:00 PM Medical Record Number: [id number] Patient Account Number: [id number] Date of Birth/Sex: Treating RN: Dec 04, 1940 (82 y.o. Erin [name] Standard Primary Care Provider: [name] Other Clinician: Referring Provider: Treating Provider/Extender: [name] Weeks in Treatment: 66 Active Problems ICD-10 Encounter Code Description Active Date MDM Diagnosis [phone] Non-pressure chronic ulcer of other part of left lower leg with fat layer  exposed5/22/2024 No Yes I87.332 Chronic venous hypertension (idiopathic) with ulcer and inflammation of left 11/26/2022 No Yes lower extremity I10 Essential (primary) hypertension 11/26/2022 No Yes Inactive Problems Resolved Problems ICD-10 Erin [name], Erin [name] ([id number]) [id number] Page 5 of 9 Code Description Active Date Resolved Date L97.522 Non-pressure chronic ulcer of other part of left foot with fat layer exposed 01/13/2023 01/13/2023 Electronic Signature(s) Signed: 03/03/2023 2:24:12 PM By: Duanne Guess MD FACS Entered By: Duanne Guess on 03/03/2023 14:24:12 -------------------------------------------------------------------------------- Progress Note Details Patient Name: Date of Service: [name], [name] 03/03/2023 2:00 PM Medical Record Number: [id number] Patient Account Number: [id number] Date of Birth/Sex: Treating RN: [birth year] (82 y.o. Erin [name] Standard Primary Care Provider: [name] Other Clinician: Referring Provider: Treating Provider/Extender: [name] Weeks in Treatment: 13 Subjective Chief Complaint Information obtained from Patient Patient presents for treatment of an open ulcer due to venous insufficiency History  of Present Illness (HPI) ADMISSION 11/26/2022 This is an 82 year old woman who was referred by her primary care provider for a persistent nonhealing wound on her left lower extremity. It has been present since February of this year. 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 I do not see any formal venous reflux studies in the electronic medical record. She is not diabetic. She does not smoke. Her PCP has been washing her leg each week and applying a Kerlix and Coban wrap, but has not performed any formal debridement and has not been using any sort of topical contact layer or ointment. ABI in clinic was noncompressible, but she has a palpable pedal pulse. 12/10/2022: The wound is a bit smaller and cleaner today. There is still fairly heavy slough on the surface. Edema control is acceptable. 12/16/2022: The wound is measuring the slightest bit smaller today. There is still thick slough on the surface. Edema control is good. 12/23/2022: The wound is a little bit smaller again today. Still with fairly heavy slough accumulation. Edema control is good. She has a new small wound on the proximal portion of the same leg. It looks as though she may have developed a small blister over the weekend subsequently ruptured. There is minimal slough on the surface. 12/30/2022: The wound is about the same size, but substantially cleaner. There is some hypertrophic granulation tissue starting to emerge. The wound that was new last week has closed but she has a different new wound today on the lateral aspect of her left lower leg. It also appears to have been a blister that opened and ruptured. There is a little bit of slough on the surface. 01/06/2023: She has new wounds today. She has a wound on the PIP knuckle of her left third and fourth toes, as well as an anterior tibial wound. She says that the wrap was too tight and it also  looks like when she pushes her foot into her shoe, it shifts the wrapping back to her midfoot causing bunching up of the wrap material and pain. The lateral leg wounds are both smaller with a little slough and eschar present. The original medial leg wound is smaller and more superficial. There is slough on the surface. 01/13/2023: She has new wounds today. The anterior tibial wound has a satellite lesion that has opened up adjacent to it. Has additional wounds on her fifth toe in addition to the existing ones on her third and fourth toe. The lateral leg wounds  seem to have healed. The original medial leg wound is also smaller, but she and her daughter have several questions regarding why we are not making forward progress, all of which are very reasonable. 01/20/2023: The wounds on her toes have healed. There is a tiny wound on the dorsum of her foot with a little bit of slough on it. The anterolateral leg wound has epithelialized quite substantially and the satellite that had opened last week is now closed. The medial leg wound measured narrower today. It has hypertrophic granulation tissue and slough present. 01/27/2023: The small wounds have all healed. The medial leg wound is smaller as well, with some slough on the surface but without reaccumulation of the hypertrophic granulation tissue. 02/03/2023: The only remaining open wound is the left medial leg wound. It is smaller and a bit more superficial. There is still slough accumulation on the surface. No hypertrophic granulation tissue. She had both her venous reflux and lower extremity arterial studies, both of which were essentially normal, although her TBI's are slightly diminished. 02/10/2023: The left medial leg wound has some granulation tissue filling in, but overall, it is still fairly fibrotic. There is some slough on the surface today. 02/18/2023: The moisture balance and surface consistency have both improved. There is some slough over  granulation tissue. Edema control is good. 02/24/2023: The wound measured smaller today. Moisture balance is good. Slough has reaccumulated. Edema remains well-controlled. 03/03/2023: The wound is a little bit smaller today. Moisture balance is stable. Minimal slough accumulation. There is more granulation tissue filling in over the fibrotic surface. Edema is well-controlled. Erin [name], Erin [name] ([id number]) [id number] Page 6 of 9 Patient History Family History Cancer - Siblings, Lung Disease - Siblings, No family history of Diabetes, Heart Disease, Hereditary Spherocytosis, Hypertension, Kidney Disease, Seizures, Stroke, Thyroid Problems, Tuberculosis. Social History Never smoker, Marital Status - Widowed, Alcohol Use - Never, Drug Use - No History, Caffeine Use - Moderate. Medical History Eyes Patient has history of Cataracts Cardiovascular Patient has history of Arrhythmia - bundle branch block, Hypertension, Peripheral Venous Disease Musculoskeletal Patient has history of Gout, Osteoarthritis Hospitalization/Surgery History - Salpingoophorectomy. - Colon resection. - Appendectomy. - Cystoscopy w/ retrogrades. - Joint replacement. - Mastectomy right. - gastric ulcer repair. - Abdominal hysterectomy. Medical A Surgical History Notes nd Hematologic/Lymphatic thrombocytopenia Gastrointestinal diverticulosis, GERD Endocrine hypothyroidism Genitourinary CKD stage III Musculoskeletal DJD, osteoporosis Oncologic breast cancer Objective Constitutional no acute distress. Vitals Time Taken: 1:53 AM, Height: 60 in, Weight: 138 lbs, BMI: 26.9, Temperature: 98.0 F, Pulse: 64 bpm, Respiratory Rate: 18 breaths/min, Blood Pressure: 118/64 mmHg. Respiratory Normal work of breathing on room air. General Notes: 03/03/2023: The wound is a little bit smaller today. Moisture balance is stable. Minimal slough accumulation. There is more granulation tissue filling in over  the fibrotic surface. Edema is well-controlled. Integumentary (Hair, Skin) Wound #1 status is Open. Original cause of wound was Gradually Appeared. The date acquired was: 08/29/2022. The wound has been in treatment 13 weeks. The wound is located on the Left,Medial Lower Leg. The wound measures 2.1cm length x 0.8cm width x 0.1cm depth; 1.319cm^2 area and 0.132cm^3 volume. There is Fat Layer (Subcutaneous Tissue) exposed. There is no tunneling or undermining noted. There is a medium amount of serosanguineous drainage noted. The wound margin is distinct with the outline attached to the wound base. There is small (1-33%) red granulation within the wound bed. There is a large (67-100%) amount of necrotic tissue within the wound bed including Adherent Slough. The periwound  skin appearance had no abnormalities noted for texture. The periwound skin appearance had no abnormalities noted for moisture. The periwound skin appearance had no abnormalities noted for color. Periwound temperature was noted as No Abnormality. The periwound has tenderness on palpation. Assessment Active Problems ICD-10 Non-pressure chronic ulcer of other part of left lower leg with fat layer exposed Chronic venous hypertension (idiopathic) with ulcer and inflammation of left lower extremity Essential (primary) hypertension Procedures Wound #1 Erin [name], Erin [name] ([id number]) [id number] Page 7 of 9 Pre-procedure diagnosis of Wound #1 is a Venous Leg Ulcer located on the Left,Medial Lower Leg .Severity of Tissue Pre Debridement is: Fat layer exposed. There was a Selective/Open Wound Non-Viable Tissue Debridement with a total area of 1.32 sq cm performed by Duanne Guess, MD. With the following instrument(s): Curette to remove Non-Viable tissue/material. Material removed includes Eschar and Slough and after achieving pain control using Lidocaine 4% Topical Solution. No specimens were taken. A time out was  conducted at 14:10, prior to the start of the procedure. A Minimum amount of bleeding was controlled with Pressure. The procedure was tolerated well with a pain level of 2 throughout and a pain level of 0 following the procedure. Post Debridement Measurements: 2.1cm length x 0.8cm width x 0.1cm depth; 0.132cm^3 volume. Character of Wound/Ulcer Post Debridement is improved. Severity of Tissue Post Debridement is: Fat layer exposed. Post procedure Diagnosis Wound #1: Same as Pre-Procedure Plan Follow-up Appointments: Return Appointment in 1 week. - Dr. Lady Gary - room 1 Tuesday 9/3 @ 2:00 pm Anesthetic: (In clinic) Topical Lidocaine 4% applied to wound bed Bathing/ Shower/ Hygiene: May shower with protection but do not get wound dressing(s) wet. Protect dressing(s) with water repellant cover (for example, large plastic bag) or a cast cover and may then take shower. Edema Control - Lymphedema / SCD / Other: Elevate legs to the level of the heart or above for 30 minutes daily and/or when sitting for 3-4 times a day throughout the day. Avoid standing for long periods of time. Exercise regularly WOUND #1: - Lower Leg Wound Laterality: Left, Medial Cleanser: Soap and Water 1 x Per Week/30 Days Discharge Instructions: May shower and wash wound with dial antibacterial soap and water prior to dressing change. Cleanser: Wound Cleanser 1 x Per Week/30 Days Discharge Instructions: Cleanse the wound with wound cleanser prior to applying a clean dressing using gauze sponges, not tissue or cotton balls. Peri-Wound Care: Sween Lotion (Moisturizing lotion) 1 x Per Week/30 Days Discharge Instructions: Apply moisturizing lotion as directed Topical: Gentamicin 1 x Per Week/30 Days Discharge Instructions: As directed by physician Topical: Mupirocin Ointment 1 x Per Week/30 Days Discharge Instructions: Apply Mupirocin (Bactroban) as instructed Prim Dressing: Promogran Prisma Matrix, 4.34 (sq in) (silver collagen)  1 x Per Week/30 Days ary Discharge Instructions: Moisten collagen with saline or hydrogel Secondary Dressing: Optifoam Non-Adhesive Dressing, 4x4 in 1 x Per Week/30 Days Discharge Instructions: Apply over primary dressing as directed. Secured With: Elastic Bandage 4 inch (ACE bandage) 1 x Per Week/30 Days Discharge Instructions: Secure with ACE bandage as directed. Secured With: [employer], 4.5x3.1 (in/yd) 1 x Per Week/30 Days Discharge Instructions: Secure with Kerlix as directed. 03/03/2023: The wound is a little bit smaller today. Moisture balance is stable. Minimal slough accumulation. There is more granulation tissue filling in over the fibrotic surface. Edema is well-controlled. I used a curette to debride the slough from the wound surface. We will continue the mixture of topical gentamicin and mupirocin with Prisma silver collagen,  Kerlix and Ace compression. Follow-up in 1 week. Electronic Signature(s) Signed: 03/03/2023 2:45:18 PM By: Duanne Guess MD FACS Entered By: Duanne Guess on 03/03/2023 14:45:17 -------------------------------------------------------------------------------- HxROS Details Patient Name: Date of Service: [name], [name] 03/03/2023 2:00 PM Medical Record Number: [id number] Patient Account Number: [id number] Date of Birth/Sex: Treating RN: [birth year] (82 y.o. Erin [name] Standard Primary Care Provider: [name] Other Clinician: Referring Provider: Treating Provider/Extender: [name] Weeks in Treatment: 13 Eyes Medical History: Positive for: NOVALEE, STREEVAL ([id number]) [id number] Page 8 of 9 Hematologic/Lymphatic Medical History: Past Medical History Notes: thrombocytopenia Cardiovascular Medical History: Positive for: Arrhythmia - bundle branch block; Hypertension; Peripheral Venous Disease Gastrointestinal Medical History: Past Medical History  Notes: diverticulosis, GERD Endocrine Medical History: Past Medical History Notes: hypothyroidism Genitourinary Medical History: Past Medical History Notes: CKD stage III Musculoskeletal Medical History: Positive for: Gout; Osteoarthritis Past Medical History Notes: DJD, osteoporosis Oncologic Medical History: Past Medical History Notes: breast cancer HBO Extended History Items Eyes: Cataracts Immunizations Pneumococcal Vaccine: Received Pneumococcal Vaccination: Yes Received Pneumococcal Vaccination On or After 60th Birthday: No Implantable Devices No devices added Hospitalization / Surgery History Type of Hospitalization/Surgery Salpingoophorectomy Colon resection Appendectomy Cystoscopy w/ retrogrades Joint replacement Mastectomy right gastric ulcer repair Abdominal hysterectomy Family and Social History Cancer: Yes - Siblings; Diabetes: No; Heart Disease: No; Hereditary Spherocytosis: No; Hypertension: No; Kidney Disease: No; Lung Disease: Yes - Siblings; Seizures: No; Stroke: No; Thyroid Problems: No; Tuberculosis: No; Never smoker; Marital Status - Widowed; Alcohol Use: Never; Drug Use: No History; Caffeine Use: Moderate; Financial Concerns: No; Food, Clothing or Shelter Needs: No; Support System Lacking: No; Transportation Concerns: No Psychologist, prison and probation services) Signed: 03/03/2023 4:13:25 PM By: Duanne Guess MD FACS Signed: 03/03/2023 4:18:27 PM By: Zenaida Deed RN, BSN Entered By: Duanne Guess on 03/03/2023 14:40:27 [name] ([id number]) [id number] Page 9 of 9 -------------------------------------------------------------------------------- SuperBill Details Patient Name: Date of Service: [name], [name] 03/03/2023 Medical Record Number: [id number] Patient Account Number: [id number] Date of Birth/Sex: Treating RN: 08-17-1940 (82 y.o. 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 I10 Essential (primary) hypertension Facility Procedures : CPT4 Code: [id number] Description: 97597 - DEBRIDE WOUND 1ST 20 SQ CM OR < ICD-10 Diagnosis Description L97.822 Non-pressure chronic ulcer of other part of left lower leg with fat layer exposed Modifier: Quantity: 1 Physician Procedures : CPT4 Code Description Modifier [id number] 99214 - WC PHYS LEVEL 4 - EST PT 25 ICD-10 Diagnosis Description L97.822 Non-pressure chronic ulcer of other part of left lower leg with fat layer exposed I87.332 Chronic venous hypertension (idiopathic) with  ulcer and inflammation of left lower extremity I10 Essential (primary) hypertension Quantity: 1 : [id number] 97597 - WC PHYS DEBR WO ANESTH 20 SQ CM ICD-10 Diagnosis Description L97.822 Non-pressure chronic ulcer of other part of left lower leg with fat layer exposed Quantity: 1 Electronic Signature(s) Signed: 03/03/2023 2:47:52 PM By: Duanne Guess MD FACS Entered By: Duanne Guess on 03/03/2023 14:47:52

## 2023-03-03 NOTE — Progress Notes (Signed)
Erin [name], Erin [name] ([id number]) [id number] Page 1 of 7 Visit Report for 03/03/2023 Arrival Information Details Patient Name: Date of Service: [name], [name] RBA RA N. 03/03/2023 2:00 PM Medical Record Number: [id number] Patient Account Number: [id number] Date of Birth/Sex: Treating RN: 23-Oct-1940 (82 y.o. F) Primary Care Erin [name]: Erin [name] Other Clinician: Referring Erin [name]: Treating Erin [name]/Extender: Erin [name] [name] in Treatment: 13 Visit Information History Since Last Visit All ordered tests and consults were completed: No Patient Arrived: Ambulatory Added or deleted any medications: No Arrival Time: 13:52 Any new allergies or adverse reactions: No Accompanied By: daughter Had a fall or experienced change in No Transfer Assistance: None activities of daily living that may affect Patient Identification Verified: Yes risk of falls: Secondary Verification Process Completed: Yes Signs or symptoms of abuse/neglect since last visito No Patient Requires Transmission-Based Precautions: No Hospitalized since last visit: No Patient Has Alerts: No Implantable device outside of the clinic excluding No cellular tissue based products placed in the center since last visit: Has Dressing in Place as Prescribed: Yes Has Compression in Place as Prescribed: Yes Pain Present Now: No Electronic Signature(s) Signed: 03/03/2023 4:18:27 PM By: Erin Deed RN, BSN Entered By: Erin [name] on 03/03/2023 14:00:53 -------------------------------------------------------------------------------- Encounter Discharge Information Details Patient Name: Date of Service: [name], [name] 03/03/2023 2:00 PM Medical Record Number: [id number] Patient Account Number: [id number] Date of Birth/Sex: Treating RN: [birth year] (82 y.o. Erin [name] Primary Care Erin [name]: Erin [name] Other Clinician: Referring Angenette Daily: Treating  Erin [name]: Erin [name] [name] in Treatment: 59 Encounter Discharge Information Items Post Procedure Vitals Discharge Condition: Stable Temperature (F): 98 Ambulatory Status: Ambulatory Pulse (bpm): 64 Discharge Destination: Home Respiratory Rate (breaths/min): 18 Transportation: Private Auto Blood Pressure (mmHg): 118/64 Accompanied By: daughter Schedule Follow-up Appointment: Yes Clinical Summary of Care: Patient Declined Electronic Signature(s) Signed: 03/03/2023 4:18:27 PM By: Erin Deed RN, BSN Entered By: Erin [name] on 03/03/2023 14:26:33 Erin [name] ([id number]) [id number] Page 2 of 7 -------------------------------------------------------------------------------- Lower Extremity Assessment Details Patient Name: Date of Service: [name], [name] 03/03/2023 2:00 PM Medical Record Number: [id number] Patient Account Number: [id number] Date of Birth/Sex: Treating RN: [birth year] (82 y.o. Erin [name] Primary Care Erin [name]: Erin [name] Other Clinician: Referring Erin [name]: Treating Erin [name]/Extender: Erin [name], Erin [name] in Treatment: 13 Edema Assessment Assessed: [Left: No] [Right: No] Edema: [Left: Ye] [Right: s] Calf Left: Right: Point of Measurement: From Medial Instep 32 cm Ankle Left: Right: Point of Measurement: From Medial Instep 18 cm Vascular Assessment Pulses: Dorsalis Pedis Palpable: [Left:Yes] Extremity colors, hair growth, and conditions: Extremity Color: [Left:Hyperpigmented] Hair Growth on Extremity: [Left:No] Temperature of Extremity: [Left:Warm < 3 seconds] Electronic Signature(s) Signed: 03/03/2023 4:18:27 PM By: Erin Deed RN, BSN Entered By: Erin [name] on 03/03/2023 14:03:13 -------------------------------------------------------------------------------- Multi Wound Chart Details Patient Name: Date of Service: [name], BA RBA  RA N. 03/03/2023 2:00 PM Medical Record Number: [id number] Patient Account Number: [id number] Date of Birth/Sex: Treating RN: [birth year] (82 y.o. Erin [name] Primary Care Erin [name]: Erin [name] Other Clinician: Referring Erin [name]: Treating Erin [name]/Extender: Erin [name], Erin [name] in Treatment: 13 Vital Signs Height(in): 60 Pulse(bpm): 64 Weight(lbs): 138 Blood Pressure(mmHg): 118/64 Body Mass Index(BMI): 26.9 Temperature(F): 98.0 Respiratory Rate(breaths/min): 18 [1:Photos:] [N/A:N/A] Left, Medial Lower Leg N/A N/A Wound Location: Gradually Appeared N/A N/A Wounding Event: Venous Leg Ulcer N/A N/A Primary Etiology: Cataracts, Arrhythmia, Hypertension, N/A N/A Comorbid History: Peripheral Venous  Disease, Gout, Osteoarthritis 08/29/2022 N/A N/A Date Acquired: 73 N/A N/A [name] of Treatment: Open N/A N/A Wound Status: No N/A N/A Wound Recurrence: 2.1x0.8x0.1 N/A N/A Measurements L x W x D (cm) 1.319 N/A N/A A (cm) : rea 0.132 N/A N/A Volume (cm) : 77.50% N/A N/A % Reduction in A rea: 77.50% N/A N/A % Reduction in Volume: Full Thickness Without Exposed N/A N/A Classification: Support Structures Medium N/A N/A Exudate A mount: Serosanguineous N/A N/A Exudate Type: red, brown N/A N/A Exudate Color: Distinct, outline attached N/A N/A Wound Margin: Small (1-33%) N/A N/A Granulation A mount: Red N/A N/A Granulation Quality: Large (67-100%) N/A N/A Necrotic A mount: Fat Layer (Subcutaneous Tissue): Yes N/A N/A Exposed Structures: Fascia: No Tendon: No Muscle: No Joint: No Bone: No Small (1-33%) N/A N/A Epithelialization: Debridement - Selective/Open Wound N/A N/A Debridement: Pre-procedure Verification/Time Out 14:10 N/A N/A Taken: Lidocaine 4% Topical Solution N/A N/A Pain Control: Necrotic/Eschar, Slough N/A N/A Tissue Debrided: Non-Viable Tissue N/A N/A Level: 1.32 N/A N/A Debridement A (sq cm): rea Curette N/A  N/A Instrument: Minimum N/A N/A Bleeding: Pressure N/A N/A Hemostasis A chieved: 2 N/A N/A Procedural Pain: 0 N/A N/A Post Procedural Pain: Procedure was tolerated well N/A N/A Debridement Treatment Response: 2.1x0.8x0.1 N/A N/A Post Debridement Measurements L x W x D (cm) 0.132 N/A N/A Post Debridement Volume: (cm) No Abnormalities Noted N/A N/A Periwound Skin Texture: No Abnormalities Noted N/A N/A Periwound Skin Moisture: No Abnormalities Noted N/A N/A Periwound Skin Color: No Abnormality N/A N/A Temperature: Yes N/A N/A Tenderness on Palpation: Debridement N/A N/A Procedures Performed: Treatment Notes Electronic Signature(s) Signed: 03/03/2023 2:24:21 PM By: Duanne Guess MD FACS Signed: 03/03/2023 4:18:27 PM By: Erin Deed RN, BSN Entered By: Duanne Guess on 03/03/2023 14:24:21 -------------------------------------------------------------------------------- Multi-Disciplinary Care Plan Details Patient Name: Date of Service: [name], [name] 03/03/2023 2:00 PM Medical Record Number: [id number] Patient Account Number: [id number] Date of Birth/Sex: Treating RN: 22-Dec-1940 (82 y.o. Erin [name] Primary Care Shenell Rogalski: Erin [name] Other Clinician: Referring Erin [name]: Treating Rylynn Kobs/Extender: [name] in Treatment: [address], [place] ([id number]) [id number] Page 4 of 7 Multidisciplinary Care Plan reviewed with physician Active Inactive Necrotic Tissue Nursing Diagnoses: Impaired tissue integrity related to necrotic/devitalized tissue Knowledge deficit related to management of necrotic/devitalized tissue Goals: Necrotic/devitalized tissue will be minimized in the wound bed Date Initiated: 11/26/2022 Target Resolution Date: 05/06/2023 Goal Status: Active Patient/caregiver will verbalize understanding of reason and process for debridement of necrotic tissue Date Initiated:  11/26/2022 Date Inactivated: 01/20/2023 Target Resolution Date: 03/06/2023 Goal Status: Met Interventions: Assess patient pain level pre-, during and post procedure and prior to discharge Provide education on necrotic tissue and debridement process Treatment Activities: Apply topical anesthetic as ordered : 11/26/2022 Notes: Wound/Skin Impairment Nursing Diagnoses: Impaired tissue integrity Knowledge deficit related to ulceration/compromised skin integrity Goals: Patient/caregiver will verbalize understanding of skin care regimen Date Initiated: 11/26/2022 Target Resolution Date: 05/06/2023 Goal Status: Active Interventions: Assess ulceration(s) every visit Treatment Activities: Skin care regimen initiated : 11/26/2022 Topical wound management initiated : 11/26/2022 Notes: Electronic Signature(s) Signed: 03/03/2023 4:18:27 PM By: Erin Deed RN, BSN Entered By: Erin [name] on 03/03/2023 13:58:33 -------------------------------------------------------------------------------- Pain Assessment Details Patient Name: Date of Service: [name], [name] 03/03/2023 2:00 PM Medical Record Number: [id number] Patient Account Number: [id number] Date of Birth/Sex: Treating RN: [birth year] (82 y.o. F) Primary Care Nethra Mehlberg: Erin [name] Other Clinician: Referring Erin [name]: Treating Erin [name]/Extender: Erin [name], Erin  R [name] in Treatment: 13 Active Problems Location of Pain Severity and Description of Pain Patient Has Paino No Site Locations Rate the pain. Erin [name], Erin [name] ([id number]) [id number] Page 5 of 7 Rate the pain. Current Pain Level: 0 Pain Management and Medication Current Pain Management: Electronic Signature(s) Signed: 03/03/2023 4:18:27 PM By: Erin Deed RN, BSN Entered By: Erin [name] on 03/03/2023 14:01:20 -------------------------------------------------------------------------------- Patient/Caregiver  Education Details Patient Name: Date of Service: [name], [name] 8/27/2024andnbsp2:00 PM Medical Record Number: [id number] Patient Account Number: [id number] Date of Birth/Gender: Treating RN: September 16, 1940 (82 y.o. Erin [name] Primary Care Physician: Erin [name] Other Clinician: Referring Physician: Treating Physician/Extender: [name] in Treatment: 13 Education Assessment Education Provided To: Patient Education Topics Provided Venous: Methods: Explain/Verbal Responses: Reinforcements needed, State content correctly Wound/Skin Impairment: Methods: Explain/Verbal Responses: Reinforcements needed, State content correctly Electronic Signature(s) Signed: 03/03/2023 4:18:27 PM By: Erin Deed RN, BSN Entered By: Erin [name] on 03/03/2023 13:58:57 -------------------------------------------------------------------------------- Wound Assessment Details Patient Name: Date of Service: [name], [name] 03/03/2023 2:00 PM Erin [name], Erin [name] ([id number]) [id number] Page 6 of 7 Medical Record Number: [id number] Patient Account Number: [id number] Date of Birth/Sex: Treating RN: 04-05-1941 (82 y.o. F) Primary Care Nery Kalisz: Erin [name] Other Clinician: Referring Erin [name]: Treating Martine Bleecker/Extender: Erin [name], Erin [name] in Treatment: 13 Wound Status Wound Number: 1 Primary Venous Leg Ulcer Etiology: Wound Location: Left, Medial Lower Leg Wound Open Wounding Event: Gradually Appeared Status: Date Acquired: 08/29/2022 Comorbid Cataracts, Arrhythmia, Hypertension, Peripheral Venous [name] Of Treatment: 13 History: Disease, Gout, Osteoarthritis Clustered Wound: No Photos Wound Measurements Length: (cm) 2.1 Width: (cm) 0.8 Depth: (cm) 0.1 Area: (cm) 1.319 Volume: (cm) 0.132 % Reduction in Area: 77.5% % Reduction in Volume: 77.5% Epithelialization: Small  (1-33%) Tunneling: No Undermining: No Wound Description Classification: Full Thickness Without Exposed Support Structures Wound Margin: Distinct, outline attached Exudate Amount: Medium Exudate Type: Serosanguineous Exudate Color: red, brown Foul Odor After Cleansing: No Slough/Fibrino Yes Wound Bed Granulation Amount: Small (1-33%) Exposed Structure Granulation Quality: Red Fascia Exposed: No Necrotic Amount: Large (67-100%) Fat Layer (Subcutaneous Tissue) Exposed: Yes Necrotic Quality: Adherent Slough Tendon Exposed: No Muscle Exposed: No Joint Exposed: No Bone Exposed: No Periwound Skin Texture Texture Color No Abnormalities Noted: Yes No Abnormalities Noted: Yes Moisture Temperature / Pain No Abnormalities Noted: Yes Temperature: No Abnormality Tenderness on Palpation: Yes Treatment Notes Wound #1 (Lower Leg) Wound Laterality: Left, Medial Cleanser Soap and Water Discharge Instruction: May shower and wash wound with dial antibacterial soap and water prior to dressing change. Wound Cleanser Discharge Instruction: Cleanse the wound with wound cleanser prior to applying a clean dressing using gauze sponges, not tissue or cotton balls. Peri-Wound Care Sween Lotion (Moisturizing lotion) Discharge Instruction: Apply moisturizing lotion as directed Erin [name], Erin [name] ([id number]) [id number] Page 7 of 7 Topical Gentamicin Discharge Instruction: As directed by physician Mupirocin Ointment Discharge Instruction: Apply Mupirocin (Bactroban) as instructed Primary Dressing Promogran Prisma Matrix, 4.34 (sq in) (silver collagen) Discharge Instruction: Moisten collagen with saline or hydrogel Secondary Dressing Optifoam Non-Adhesive Dressing, 4x4 in Discharge Instruction: Apply over primary dressing as directed. Secured With Elastic Bandage 4 inch (ACE bandage) Discharge Instruction: Secure with ACE bandage as directed. Kerlix Roll Sterile, 4.5x3.1  (in/yd) Discharge Instruction: Secure with Kerlix as directed. Compression Wrap Compression Stockings Add-Ons Electronic Signature(s) Signed: 03/03/2023 4:18:27 PM By: Erin Deed RN, BSN Entered By: Erin [name] on 03/03/2023 14:06:58 -------------------------------------------------------------------------------- Vitals Details Patient Name: Date of  Service: [name], [name] 03/03/2023 2:00 PM Medical Record Number: [id number] Patient Account Number: [id number] Date of Birth/Sex: Treating RN: April 14, 1941 (82 y.o. F) Primary Care Jannie Doyle: Erin [name] Other Clinician: Referring Azilee Pirro: Treating Adelee Hannula/Extender: Erin [name], Erin [name] in Treatment: 13 Vital Signs Time Taken: 01:53 Temperature (F): 98.0 Height (in): 60 Pulse (bpm): 64 Weight (lbs): 138 Respiratory Rate (breaths/min): 18 Body Mass Index (BMI): 26.9 Blood Pressure (mmHg): 118/64 Reference Range: 80 - 120 mg / dl Electronic Signature(s) Signed: 03/03/2023 4:18:27 PM By: Erin Deed RN, BSN Entered By: Erin [name] on 03/03/2023 14:01:03

## 2023-03-10 ENCOUNTER — Encounter (HOSPITAL_BASED_OUTPATIENT_CLINIC_OR_DEPARTMENT_OTHER): Payer: Medicare HMO | Attending: General Surgery | Admitting: General Surgery

## 2023-03-10 DIAGNOSIS — L97822 Non-pressure chronic ulcer of other part of left lower leg with fat layer exposed: Secondary | ICD-10-CM | POA: Diagnosis not present

## 2023-03-10 DIAGNOSIS — I129 Hypertensive chronic kidney disease with stage 1 through stage 4 chronic kidney disease, or unspecified chronic kidney disease: Secondary | ICD-10-CM | POA: Diagnosis not present

## 2023-03-10 DIAGNOSIS — I87332 Chronic venous hypertension (idiopathic) with ulcer and inflammation of left lower extremity: Secondary | ICD-10-CM | POA: Insufficient documentation

## 2023-03-10 DIAGNOSIS — I872 Venous insufficiency (chronic) (peripheral): Secondary | ICD-10-CM | POA: Diagnosis not present

## 2023-03-10 DIAGNOSIS — N183 Chronic kidney disease, stage 3 unspecified: Secondary | ICD-10-CM | POA: Diagnosis not present

## 2023-03-10 NOTE — Progress Notes (Signed)
Erin [name], Erin [name] ([id number]) [id number] Page 1 of 7 Visit Report for 03/10/2023 Arrival Information Details Patient Name: Date of Service: [name], [name] RBA RA N. 03/10/2023 2:00 PM Medical Record Number: [id number] Patient Account Number: [id number] Date of Birth/Sex: Treating RN: 01-Dec-1940 (82 y.o. F) Primary Care Larraine Argo: [name] Other Clinician: Referring Megean Fabio: Treating Griselle Rufer/Extender: [name] Weeks in Treatment: 14 Visit Information History Since Last Visit All ordered tests and consults were completed: No Patient Arrived: Ambulatory Added or deleted any medications: No Arrival Time: 13:56 Any new allergies or adverse reactions: No Accompanied By: daughter Had a fall or experienced change in No Transfer Assistance: None activities of daily living that may affect Patient Identification Verified: Yes risk of falls: Secondary Verification Process Completed: Yes Signs or symptoms of abuse/neglect since last visito No Patient Requires Transmission-Based Precautions: No Hospitalized since last visit: No Patient Has Alerts: No Implantable device outside of the clinic excluding No cellular tissue based products placed in the center since last visit: Has Dressing in Place as Prescribed: Yes Has Compression in Place as Prescribed: Yes Pain Present Now: No Electronic Signature(s) Signed: 03/10/2023 5:02:17 PM By: Zenaida Deed RN, BSN Entered By: Zenaida Deed on 03/10/2023 11:04:22 -------------------------------------------------------------------------------- Encounter Discharge Information Details Patient Name: Date of Service: [name], [name] 03/10/2023 2:00 PM Medical Record Number: [id number] Patient Account Number: [id number] Date of Birth/Sex: Treating RN: Feb 04, 1941 (82 y.o. Erin [name] Primary Care Valory Wetherby: [name] Other Clinician: Referring Ernesta Trabert: Treating  Paublo Warshawsky/Extender: [name] Weeks in Treatment: 109 Encounter Discharge Information Items Post Procedure Vitals Discharge Condition: Stable Temperature (F): 98.3 Ambulatory Status: Ambulatory Pulse (bpm): 70 Discharge Destination: Home Respiratory Rate (breaths/min): 18 Transportation: Private Auto Blood Pressure (mmHg): 169/79 Accompanied By: daughhter Schedule Follow-up Appointment: Yes Clinical Summary of Care: Patient Declined Electronic Signature(s) Signed: 03/10/2023 5:02:17 PM By: Zenaida Deed RN, BSN Entered By: Zenaida Deed on 03/10/2023 11:41:04 [name] ([id number]) [id number] Page 2 of 7 -------------------------------------------------------------------------------- Lower Extremity Assessment Details Patient Name: Date of Service: [name], [name] 03/10/2023 2:00 PM Medical Record Number: [id number] Patient Account Number: [id number] Date of Birth/Sex: Treating RN: 1940/10/17 (82 y.o. Erin [name] Primary Care Harwood Nall: [name] Other Clinician: Referring Sudeep Scheibel: Treating Stepfon Rawles/Extender: [name], [name] in Treatment: 14 Edema Assessment Assessed: [Left: No] [Right: No] Edema: [Left: Ye] [Right: s] Calf Left: Right: Point of Measurement: From Medial Instep 28 cm Ankle Left: Right: Point of Measurement: From Medial Instep 18 cm Vascular Assessment Pulses: Dorsalis Pedis Palpable: [Left:Yes] Extremity colors, hair growth, and conditions: Extremity Color: [Left:Hyperpigmented] Hair Growth on Extremity: [Left:No] Temperature of Extremity: [Left:Warm < 3 seconds] Electronic Signature(s) Signed: 03/10/2023 5:02:17 PM By: Zenaida Deed RN, BSN Entered By: Zenaida Deed on 03/10/2023 11:06:20 -------------------------------------------------------------------------------- Multi Wound Chart Details Patient Name: Date of Service: [name], BA RBA  RA N. 03/10/2023 2:00 PM Medical Record Number: [id number] Patient Account Number: [id number] Date of Birth/Sex: Treating RN: 02-26-1941 (82 y.o. Erin [name] Primary Care Anavi Branscum: [name] Other Clinician: Referring Sriram Febles: Treating Mykenzi Vanzile/Extender: [name], [name] in Treatment: 14 Vital Signs Height(in): 60 Pulse(bpm): 70 Weight(lbs): 138 Blood Pressure(mmHg): 169/79 Body Mass Index(BMI): 26.9 Temperature(F): 98.3 Respiratory Rate(breaths/min): 18 [1:Photos:] [N/A:N/A] Left, Medial Lower Leg N/A N/A Wound Location: Gradually Appeared N/A N/A Wounding Event: Venous Leg Ulcer N/A N/A Primary Etiology: Cataracts, Arrhythmia, Hypertension, N/A N/A Comorbid History: Peripheral Venous  Disease, Gout, Osteoarthritis 08/29/2022 N/A N/A Date Acquired: 14 N/A N/A Weeks of Treatment: Open N/A N/A Wound Status: No N/A N/A Wound Recurrence: 1.7x0.8x0.1 N/A N/A Measurements L x W x D (cm) 1.068 N/A N/A A (cm) : rea 0.107 N/A N/A Volume (cm) : 81.80% N/A N/A % Reduction in A rea: 81.80% N/A N/A % Reduction in Volume: Full Thickness Without Exposed N/A N/A Classification: Support Structures Medium N/A N/A Exudate A mount: Serosanguineous N/A N/A Exudate Type: red, brown N/A N/A Exudate Color: Distinct, outline attached N/A N/A Wound Margin: Small (1-33%) N/A N/A Granulation A mount: Red N/A N/A Granulation Quality: Large (67-100%) N/A N/A Necrotic A mount: Fat Layer (Subcutaneous Tissue): Yes N/A N/A Exposed Structures: Fascia: No Tendon: No Muscle: No Joint: No Bone: No Small (1-33%) N/A N/A Epithelialization: Debridement - Selective/Open Wound N/A N/A Debridement: Pre-procedure Verification/Time Out 14:15 N/A N/A Taken: Lidocaine 4% Topical Solution N/A N/A Pain Control: Necrotic/Eschar, Slough N/A N/A Tissue Debrided: Non-Viable Tissue N/A N/A Level: 1.07 N/A N/A Debridement A (sq cm): rea Curette N/A  N/A Instrument: Minimum N/A N/A Bleeding: Pressure N/A N/A Hemostasis A chieved: 3 N/A N/A Procedural Pain: 2 N/A N/A Post Procedural Pain: Procedure was tolerated well N/A N/A Debridement Treatment Response: 1.7x0.8x0.1 N/A N/A Post Debridement Measurements L x W x D (cm) 0.107 N/A N/A Post Debridement Volume: (cm) No Abnormalities Noted N/A N/A Periwound Skin Texture: No Abnormalities Noted N/A N/A Periwound Skin Moisture: No Abnormalities Noted N/A N/A Periwound Skin Color: No Abnormality N/A N/A Temperature: Yes N/A N/A Tenderness on Palpation: Debridement N/A N/A Procedures Performed: Treatment Notes Electronic Signature(s) Signed: 03/10/2023 2:30:21 PM By: Duanne Guess MD FACS Signed: 03/10/2023 5:02:17 PM By: Zenaida Deed RN, BSN Entered By: Duanne Guess on 03/10/2023 11:30:21 -------------------------------------------------------------------------------- Multi-Disciplinary Care Plan Details Patient Name: Date of Service: [name], [name] 03/10/2023 2:00 PM Medical Record Number: [id number] Patient Account Number: [id number] Date of Birth/Sex: Treating RN: October 16, 1940 (82 y.o. Erin [name] Primary Care Meko Masterson: [name] Other Clinician: Referring Michaeleen Down: Treating Matthew Pais/Extender: [name], Frederick Peers in Treatment: [address], [place] N ([id number]) [id number] Page 4 of 7 Multidisciplinary Care Plan reviewed with physician Active Inactive Necrotic Tissue Nursing Diagnoses: Impaired tissue integrity related to necrotic/devitalized tissue Knowledge deficit related to management of necrotic/devitalized tissue Goals: Necrotic/devitalized tissue will be minimized in the wound bed Date Initiated: 11/26/2022 Target Resolution Date: 05/06/2023 Goal Status: Active Patient/caregiver will verbalize understanding of reason and process for debridement of necrotic tissue Date Initiated:  11/26/2022 Date Inactivated: 01/20/2023 Target Resolution Date: 03/06/2023 Goal Status: Met Interventions: Assess patient pain level pre-, during and post procedure and prior to discharge Provide education on necrotic tissue and debridement process Treatment Activities: Apply topical anesthetic as ordered : 11/26/2022 Notes: Wound/Skin Impairment Nursing Diagnoses: Impaired tissue integrity Knowledge deficit related to ulceration/compromised skin integrity Goals: Patient/caregiver will verbalize understanding of skin care regimen Date Initiated: 11/26/2022 Target Resolution Date: 05/06/2023 Goal Status: Active Interventions: Assess ulceration(s) every visit Treatment Activities: Skin care regimen initiated : 11/26/2022 Topical wound management initiated : 11/26/2022 Notes: Electronic Signature(s) Signed: 03/10/2023 5:02:17 PM By: Zenaida Deed RN, BSN Entered By: Zenaida Deed on 03/10/2023 11:09:23 -------------------------------------------------------------------------------- Pain Assessment Details Patient Name: Date of Service: [name], [name] 03/10/2023 2:00 PM Medical Record Number: [id number] Patient Account Number: [id number] Date of Birth/Sex: Treating RN: [birth year] (82 y.o. F) Primary Care Rafael Salway: [name] Other Clinician: Referring Jalecia Leon: Treating Annelie Boak/Extender: [name], Ravisankar  R Weeks in Treatment: 14 Active Problems Location of Pain Severity and Description of Pain Patient Has Paino No Site Locations Rate the pain. KEYASIA, KRUTH ([id number]) [id number] Page 5 of 7 Rate the pain. Current Pain Level: 0 Pain Management and Medication Current Pain Management: Electronic Signature(s) Signed: 03/10/2023 5:02:17 PM By: Zenaida Deed RN, BSN Entered By: Zenaida Deed on 03/10/2023 11:05:11 -------------------------------------------------------------------------------- Patient/Caregiver  Education Details Patient Name: Date of Service: [name], [name] 9/3/2024andnbsp2:00 PM Medical Record Number: [id number] Patient Account Number: [id number] Date of Birth/Gender: Treating RN: [birth year] (82 y.o. Erin [name] Primary Care Physician: [name] Other Clinician: Referring Physician: Treating Physician/Extender: [name] in Treatment: 14 Education Assessment Education Provided To: Patient Education Topics Provided Venous: Methods: Explain/Verbal Responses: Reinforcements needed, State content correctly Wound Debridement: Methods: Explain/Verbal Responses: Reinforcements needed, State content correctly Electronic Signature(s) Signed: 03/10/2023 5:02:17 PM By: Zenaida Deed RN, BSN Entered By: Zenaida Deed on 03/10/2023 11:10:39 -------------------------------------------------------------------------------- Wound Assessment Details Patient Name: Date of Service: [name], [name] 03/10/2023 2:00 PM JAENA, MCENROE ([id number]) [id number] Page 6 of 7 Medical Record Number: [id number] Patient Account Number: [id number] Date of Birth/Sex: Treating RN: [birth year] (82 y.o. Erin [name] Primary Care Renessa Wellnitz: [name] Other Clinician: Referring Danny Zimny: Treating Syliva Mee/Extender: [name], [name] in Treatment: 14 Wound Status Wound Number: 1 Primary Venous Leg Ulcer Etiology: Wound Location: Left, Medial Lower Leg Wound Open Wounding Event: Gradually Appeared Status: Date Acquired: 08/29/2022 Comorbid Cataracts, Arrhythmia, Hypertension, Peripheral Venous Weeks Of Treatment: 14 History: Disease, Gout, Osteoarthritis Clustered Wound: No Photos Wound Measurements Length: (cm) 1.7 Width: (cm) 0.8 Depth: (cm) 0.1 Area: (cm) 1.068 Volume: (cm) 0.107 % Reduction in Area: 81.8% % Reduction in Volume: 81.8% Epithelialization: Small  (1-33%) Tunneling: No Undermining: No Wound Description Classification: Full Thickness Without Exposed Support Structures Wound Margin: Distinct, outline attached Exudate Amount: Medium Exudate Type: Serosanguineous Exudate Color: red, brown Foul Odor After Cleansing: No Slough/Fibrino Yes Wound Bed Granulation Amount: Small (1-33%) Exposed Structure Granulation Quality: Red Fascia Exposed: No Necrotic Amount: Large (67-100%) Fat Layer (Subcutaneous Tissue) Exposed: Yes Necrotic Quality: Adherent Slough Tendon Exposed: No Muscle Exposed: No Joint Exposed: No Bone Exposed: No Periwound Skin Texture Texture Color No Abnormalities Noted: Yes No Abnormalities Noted: Yes Moisture Temperature / Pain No Abnormalities Noted: Yes Temperature: No Abnormality Tenderness on Palpation: Yes Treatment Notes Wound #1 (Lower Leg) Wound Laterality: Left, Medial Cleanser Soap and Water Discharge Instruction: May shower and wash wound with dial antibacterial soap and water prior to dressing change. Wound Cleanser Discharge Instruction: Cleanse the wound with wound cleanser prior to applying a clean dressing using gauze sponges, not tissue or cotton balls. Peri-Wound Care Sween Lotion (Moisturizing lotion) Discharge Instruction: Apply moisturizing lotion as directed SAMMIE, AYAD ([id number]) [phone].pdf Page 7 of 7 Topical Gentamicin Discharge Instruction: As directed by physician Mupirocin Ointment Discharge Instruction: Apply Mupirocin (Bactroban) as instructed Primary Dressing Promogran Prisma Matrix, 4.34 (sq in) (silver collagen) Discharge Instruction: Moisten collagen with saline or hydrogel Secondary Dressing Optifoam Non-Adhesive Dressing, 4x4 in Discharge Instruction: Apply over primary dressing as directed. Secured With Elastic Bandage 4 inch (ACE bandage) Discharge Instruction: Secure with ACE bandage as directed. Kerlix Roll Sterile, 4.5x3.1  (in/yd) Discharge Instruction: Secure with Kerlix as directed. Compression Wrap Compression Stockings Add-Ons Electronic Signature(s) Signed: 03/10/2023 5:02:17 PM By: Zenaida Deed RN, BSN Entered By: Zenaida Deed on 03/10/2023 11:13:32 -------------------------------------------------------------------------------- Vitals Details Patient Name:  Date of Service: [name], [name] 03/10/2023 2:00 PM Medical Record Number: [id number] Patient Account Number: [id number] Date of Birth/Sex: Treating RN: [birth year] (82 y.o. F) Primary Care Rosaleigh Brazzel: [name] Other Clinician: Referring Remie Mathison: Treating Klyde Banka/Extender: [name], [name] in Treatment: 14 Vital Signs Time Taken: 01:57 Temperature (F): 98.3 Height (in): 60 Pulse (bpm): 70 Weight (lbs): 138 Respiratory Rate (breaths/min): 18 Body Mass Index (BMI): 26.9 Blood Pressure (mmHg): 169/79 Reference Range: 80 - 120 mg / dl Electronic Signature(s) Signed: 03/10/2023 5:02:17 PM By: Zenaida Deed RN, BSN Entered By: Zenaida Deed on 03/10/2023 11:04:44

## 2023-03-10 NOTE — Progress Notes (Signed)
Erin [name], Erin [name] ([id number]) [id number] Page 1 of 9 Visit Report for 03/10/2023 Chief Complaint Document Details Patient Name: Date of Service: [name], [name] RBA RA N. 03/10/2023 2:00 PM Medical Record Number: [id number] Patient Account Number: [id number] Date of Birth/Sex: Treating RN: 1941-01-02 (82 y.o. Erin [name] Primary Care Provider: Hoyle [name] Other Clinician: Referring Provider: Treating Provider/Extender: Erin [name] in Treatment: 14 Information Obtained from: Patient Chief Complaint Patient presents for treatment of an open ulcer due to venous insufficiency Electronic Signature(s) Signed: 03/10/2023 2:33:09 PM By: Erin Guess MD FACS Entered By: Erin [name] on 03/10/2023 11:33:09 -------------------------------------------------------------------------------- Debridement Details Patient Name: Date of Service: [name], [name] 03/10/2023 2:00 PM Medical Record Number: [id number] Patient Account Number: [id number] Date of Birth/Sex: Treating RN: 02/05/1941 (82 y.o. Erin [name], Erin [name] Primary Care Provider: Hoyle [name] Other Clinician: Referring Provider: Treating Provider/Extender: Erin [name] in Treatment: 14 Debridement Performed for Assessment: Wound #1 Left,Medial Lower Leg Performed By: Physician Erin Guess, MD Debridement Type: Debridement Severity of Tissue Pre Debridement: Fat layer exposed Level of Consciousness (Pre-procedure): Awake and Alert Pre-procedure Verification/Time Out Yes - 14:15 Taken: Start Time: 14:15 Pain Control: Lidocaine 4% Topical Solution Percent of Wound Bed Debrided: 100% T Area Debrided (cm): otal 1.07 Tissue and other material debrided: Non-Viable, Eschar, Slough, Slough Level: Non-Viable Tissue Debridement Description: Selective/Open Wound Instrument: Curette Bleeding: Minimum Hemostasis Achieved:  Pressure Procedural Pain: 3 Post Procedural Pain: 2 Response to Treatment: Procedure was tolerated well Level of Consciousness (Post- Awake and Alert procedure): Post Debridement Measurements of Total Wound Length: (cm) 1.7 Width: (cm) 0.8 Depth: (cm) 0.1 Volume: (cm) 0.107 Character of Wound/Ulcer Post Debridement: Improved Severity of Tissue Post Debridement: Fat layer exposed Erin [name], Erin [name] ([id number]) [id number] Page 2 of 9 Post Procedure Diagnosis Same as Pre-procedure Notes scribed for Erin [name] by Erin Deed, RN Electronic Signature(s) Signed: 03/10/2023 3:32:48 PM By: Erin Guess MD FACS Signed: 03/10/2023 5:02:17 PM By: Erin Deed RN, BSN Entered By: Erin [name] on 03/10/2023 11:20:19 -------------------------------------------------------------------------------- HPI Details Patient Name: Date of Service: [name], [name] 03/10/2023 2:00 PM Medical Record Number: [id number] Patient Account Number: [id number] Date of Birth/Sex: Treating RN: [birth year] (82 y.o. Erin [name] Primary Care Provider: Hoyle [name] Other Clinician: Referring Provider: Treating Provider/Extender: Erin [name] in Treatment: 14 History of Present Illness HPI Description: ADMISSION 11/26/2022 This is an 82 year old woman who was referred by her primary care provider for a persistent nonhealing wound on her left lower extremity. It has been present since February of this year. 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 I do not see any formal venous reflux studies in the electronic medical record. She is not diabetic. She does not smoke. Her PCP has been washing her leg each week and applying a Kerlix and Coban wrap, but has not performed any formal debridement and has not been using any sort of topical contact layer  or ointment. ABI in clinic was noncompressible, but she has a palpable pedal pulse. 12/10/2022: The wound is a bit smaller and cleaner today. There is still fairly heavy slough on the surface. Edema control is acceptable. 12/16/2022: The wound is measuring the slightest bit smaller today. There is still thick slough on the surface. Edema control is good. 12/23/2022: The wound is a little bit smaller again  today. Still with fairly heavy slough accumulation. Edema control is good. She has a new small wound on the proximal portion of the same leg. It looks as though she may have developed a small blister over the weekend subsequently ruptured. There is minimal slough on the surface. 12/30/2022: The wound is about the same size, but substantially cleaner. There is some hypertrophic granulation tissue starting to emerge. The wound that was new last week has closed but she has a different new wound today on the lateral aspect of her left lower leg. It also appears to have been a blister that opened and ruptured. There is a little bit of slough on the surface. 01/06/2023: She has new wounds today. She has a wound on the PIP knuckle of her left third and fourth toes, as well as an anterior tibial wound. She says that the wrap was too tight and it also looks like when she pushes her foot into her shoe, it shifts the wrapping back to her midfoot causing bunching up of the wrap material and pain. The lateral leg wounds are both smaller with a little slough and eschar present. The original medial leg wound is smaller and more superficial. There is slough on the surface. 01/13/2023: She has new wounds today. The anterior tibial wound has a satellite lesion that has opened up adjacent to it. Has additional wounds on her fifth toe in addition to the existing ones on her third and fourth toe. The lateral leg wounds seem to have healed. The original medial leg wound is also smaller, but she and her daughter have several  questions regarding why we are not making forward progress, all of which are very reasonable. 01/20/2023: The wounds on her toes have healed. There is a tiny wound on the dorsum of her foot with a little bit of slough on it. The anterolateral leg wound has epithelialized quite substantially and the satellite that had opened last week is now closed. The medial leg wound measured narrower today. It has hypertrophic granulation tissue and slough present. 01/27/2023: The small wounds have all healed. The medial leg wound is smaller as well, with some slough on the surface but without reaccumulation of the hypertrophic granulation tissue. 02/03/2023: The only remaining open wound is the left medial leg wound. It is smaller and a bit more superficial. There is still slough accumulation on the surface. No hypertrophic granulation tissue. She had both her venous reflux and lower extremity arterial studies, both of which were essentially normal, although her TBI's are slightly diminished. 02/10/2023: The left medial leg wound has some granulation tissue filling in, but overall, it is still fairly fibrotic. There is some slough on the surface today. 02/18/2023: The moisture balance and surface consistency have both improved. There is some slough over granulation tissue. Edema control is good. 02/24/2023: The wound measured smaller today. Moisture balance is good. Slough has reaccumulated. Edema remains well-controlled. 03/03/2023: The wound is a little bit smaller today. Moisture balance is stable. Minimal slough accumulation. There is more granulation tissue filling in over the fibrotic surface. Edema is well-controlled. 03/10/2023: The wound is smaller again today. Moisture balance is good. Light slough accumulation on the fibrotic surface. Edema is well-controlled. Erin [name], Erin [name] ([id number]) [id number] Page 3 of 9 Electronic Signature(s) Signed: 03/10/2023 2:35:06 PM By: Erin Guess MD FACS Entered By: Erin [name] on 03/10/2023 11:35:06 -------------------------------------------------------------------------------- Physical Exam Details Patient Name: Date of Service: [name], [name] 03/10/2023 2:00 PM Medical Record  Number: [id number] Patient Account Number: [id number] Date of Birth/Sex: Treating RN: 10/19/1940 (82 y.o. Erin [name] Primary Care Provider: Hoyle [name] Other Clinician: Referring Provider: Treating Provider/Extender: [name], [name] [name] in Treatment: 14 Constitutional Hypertensive, asymptomatic. . . . no acute distress. Respiratory Normal work of breathing on room air. Notes 03/10/2023: The wound is smaller again today. Moisture balance is good. Light slough accumulation on the fibrotic surface. Edema is well-controlled. Electronic Signature(s) Signed: 03/10/2023 2:37:35 PM By: Erin Guess MD FACS Entered By: Erin [name] on 03/10/2023 11:37:35 -------------------------------------------------------------------------------- Physician Orders Details Patient Name: Date of Service: [name], [name] 03/10/2023 2:00 PM Medical Record Number: [id number] Patient Account Number: [id number] Date of Birth/Sex: Treating RN: 01-May-1941 (82 y.o. Erin [name] Primary Care Provider: Hoyle [name] Other Clinician: Referring Provider: Treating Provider/Extender: [name] in Treatment: 31 Verbal / Phone Orders: No Diagnosis Coding ICD-10 Coding Code Description [phone] Non-pressure chronic ulcer of other part of left lower leg with fat layer exposed I87.332 Chronic venous hypertension (idiopathic) with ulcer and inflammation of left lower extremity I10 Essential (primary) hypertension Follow-up Appointments ppointment in 1 week. - Erin [name] - room 1 Return A Tuesday 9/10 @ 2:00 pm Anesthetic (In clinic) Topical Lidocaine 4% applied to wound  bed Bathing/ Shower/ Hygiene May shower with protection but do not get wound dressing(s) wet. Protect dressing(s) with water repellant cover (for example, large plastic bag) or a cast cover and may then take shower. Edema Control - Lymphedema / SCD / Other Elevate legs to the level of the heart or above for 30 minutes daily and/or when sitting for 3-4 times a day throughout the day. Avoid standing for long periods of time. Erin [name], Erin [name] ([id number]) [id number] Page 4 of 9 Exercise regularly Wound Treatment Wound #1 - Lower Leg Wound Laterality: Left, Medial Cleanser: Soap and Water 1 x Per Week/30 Days Discharge Instructions: May shower and wash wound with dial antibacterial soap and water prior to dressing change. Cleanser: Wound Cleanser 1 x Per Week/30 Days Discharge Instructions: Cleanse the wound with wound cleanser prior to applying a clean dressing using gauze sponges, not tissue or cotton balls. Peri-Wound Care: Sween Lotion (Moisturizing lotion) 1 x Per Week/30 Days Discharge Instructions: Apply moisturizing lotion as directed Topical: Gentamicin 1 x Per Week/30 Days Discharge Instructions: As directed by physician Topical: Mupirocin Ointment 1 x Per Week/30 Days Discharge Instructions: Apply Mupirocin (Bactroban) as instructed Prim Dressing: Promogran Prisma Matrix, 4.34 (sq in) (silver collagen) 1 x Per Week/30 Days ary Discharge Instructions: Moisten collagen with saline or hydrogel Secondary Dressing: Optifoam Non-Adhesive Dressing, 4x4 in 1 x Per Week/30 Days Discharge Instructions: Apply over primary dressing as directed. Secured With: Elastic Bandage 4 inch (ACE bandage) 1 x Per Week/30 Days Discharge Instructions: Secure with ACE bandage as directed. Secured With: [employer], 4.5x3.1 (in/yd) 1 x Per Week/30 Days Discharge Instructions: Secure with Kerlix as directed. Electronic Signature(s) Signed: 03/10/2023 3:32:48 PM By:  Erin Guess MD FACS Entered By: Erin [name] on 03/10/2023 11:38:52 -------------------------------------------------------------------------------- Problem List Details Patient Name: Date of Service: [name], [name] 03/10/2023 2:00 PM Medical Record Number: [id number] Patient Account Number: [id number] Date of Birth/Sex: Treating RN: 1941/03/22 (82 y.o. Erin [name] Primary Care Provider: Hoyle [name] Other Clinician: Referring Provider: Treating Provider/Extender: Erin [name] in Treatment: 10 Active Problems ICD-10 Encounter Code Description Active Date MDM Diagnosis L97.822 Non-pressure chronic  ulcer of other part of left lower leg with fat layer exposed5/22/2024 No Yes I87.332 Chronic venous hypertension (idiopathic) with ulcer and inflammation of left 11/26/2022 No Yes lower extremity I10 Essential (primary) hypertension 11/26/2022 No Yes Inactive Problems CYBELLE, GARVIS ([id number]) [id number] Page 5 of 9 Resolved Problems ICD-10 Code Description Active Date Resolved Date L97.522 Non-pressure chronic ulcer of other part of left foot with fat layer exposed 01/13/2023 01/13/2023 Electronic Signature(s) Signed: 03/10/2023 2:29:29 PM By: Erin Guess MD FACS Entered By: Erin [name] on 03/10/2023 11:29:29 -------------------------------------------------------------------------------- Progress Note Details Patient Name: Date of Service: [name], [name] 03/10/2023 2:00 PM Medical Record Number: [id number] Patient Account Number: [id number] Date of Birth/Sex: Treating RN: 25-Oct-1940 (82 y.o. Erin [name] Primary Care Provider: Hoyle [name] Other Clinician: Referring Provider: Treating Provider/Extender: Erin [name] in Treatment: 14 Subjective Chief Complaint Information obtained from Patient Patient presents for treatment of an open ulcer due  to venous insufficiency History of Present Illness (HPI) ADMISSION 11/26/2022 This is an 83 year old woman who was referred by her primary care provider for a persistent nonhealing wound on her left lower extremity. It has been present since February of this year. 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 I do not see any formal venous reflux studies in the electronic medical record. She is not diabetic. She does not smoke. Her PCP has been washing her leg each week and applying a Kerlix and Coban wrap, but has not performed any formal debridement and has not been using any sort of topical contact layer or ointment. ABI in clinic was noncompressible, but she has a palpable pedal pulse. 12/10/2022: The wound is a bit smaller and cleaner today. There is still fairly heavy slough on the surface. Edema control is acceptable. 12/16/2022: The wound is measuring the slightest bit smaller today. There is still thick slough on the surface. Edema control is good. 12/23/2022: The wound is a little bit smaller again today. Still with fairly heavy slough accumulation. Edema control is good. She has a new small wound on the proximal portion of the same leg. It looks as though she may have developed a small blister over the weekend subsequently ruptured. There is minimal slough on the surface. 12/30/2022: The wound is about the same size, but substantially cleaner. There is some hypertrophic granulation tissue starting to emerge. The wound that was new last week has closed but she has a different new wound today on the lateral aspect of her left lower leg. It also appears to have been a blister that opened and ruptured. There is a little bit of slough on the surface. 01/06/2023: She has new wounds today. She has a wound on the PIP knuckle of her left third and fourth toes, as well as an anterior tibial wound. She says that the  wrap was too tight and it also looks like when she pushes her foot into her shoe, it shifts the wrapping back to her midfoot causing bunching up of the wrap material and pain. The lateral leg wounds are both smaller with a little slough and eschar present. The original medial leg wound is smaller and more superficial. There is slough on the surface. 01/13/2023: She has new wounds today. The anterior tibial wound has a satellite lesion that has opened up adjacent to it. Has additional wounds on her fifth toe in addition to the existing  ones on her third and fourth toe. The lateral leg wounds seem to have healed. The original medial leg wound is also smaller, but she and her daughter have several questions regarding why we are not making forward progress, all of which are very reasonable. 01/20/2023: The wounds on her toes have healed. There is a tiny wound on the dorsum of her foot with a little bit of slough on it. The anterolateral leg wound has epithelialized quite substantially and the satellite that had opened last week is now closed. The medial leg wound measured narrower today. It has hypertrophic granulation tissue and slough present. 01/27/2023: The small wounds have all healed. The medial leg wound is smaller as well, with some slough on the surface but without reaccumulation of the hypertrophic granulation tissue. 02/03/2023: The only remaining open wound is the left medial leg wound. It is smaller and a bit more superficial. There is still slough accumulation on the surface. No hypertrophic granulation tissue. She had both her venous reflux and lower extremity arterial studies, both of which were essentially normal, although her TBI's are slightly diminished. 02/10/2023: The left medial leg wound has some granulation tissue filling in, but overall, it is still fairly fibrotic. There is some slough on the surface today. 02/18/2023: The moisture balance and surface consistency have both improved.  There is some slough over granulation tissue. Edema control is good. 02/24/2023: The wound measured smaller today. Moisture balance is good. Slough has reaccumulated. Edema remains well-controlled. Erin [name], Erin [name] ([id number]) [id number] Page 6 of 9 03/03/2023: The wound is a little bit smaller today. Moisture balance is stable. Minimal slough accumulation. There is more granulation tissue filling in over the fibrotic surface. Edema is well-controlled. 03/10/2023: The wound is smaller again today. Moisture balance is good. Light slough accumulation on the fibrotic surface. Edema is well-controlled. Patient History Family History Cancer - Siblings, Lung Disease - Siblings, No family history of Diabetes, Heart Disease, Hereditary Spherocytosis, Hypertension, Kidney Disease, Seizures, Stroke, Thyroid Problems, Tuberculosis. Social History Never smoker, Marital Status - Widowed, Alcohol Use - Never, Drug Use - No History, Caffeine Use - Moderate. Medical History Eyes Patient has history of Cataracts Cardiovascular Patient has history of Arrhythmia - bundle branch block, Hypertension, Peripheral Venous Disease Musculoskeletal Patient has history of Gout, Osteoarthritis Hospitalization/Surgery History - Salpingoophorectomy. - Colon resection. - Appendectomy. - Cystoscopy w/ retrogrades. - Joint replacement. - Mastectomy right. - gastric ulcer repair. - Abdominal hysterectomy. Medical A Surgical History Notes nd Hematologic/Lymphatic thrombocytopenia Gastrointestinal diverticulosis, GERD Endocrine hypothyroidism Genitourinary CKD stage III Musculoskeletal DJD, osteoporosis Oncologic breast cancer Objective Constitutional Hypertensive, asymptomatic. no acute distress. Vitals Time Taken: 1:57 AM, Height: 60 in, Weight: 138 lbs, BMI: 26.9, Temperature: 98.3 F, Pulse: 70 bpm, Respiratory Rate: 18 breaths/min, Blood Pressure: 169/79 mmHg. Respiratory Normal work  of breathing on room air. General Notes: 03/10/2023: The wound is smaller again today. Moisture balance is good. Light slough accumulation on the fibrotic surface. Edema is well- controlled. Integumentary (Hair, Skin) Wound #1 status is Open. Original cause of wound was Gradually Appeared. The date acquired was: 08/29/2022. The wound has been in treatment 14 [name]. The wound is located on the Left,Medial Lower Leg. The wound measures 1.7cm length x 0.8cm width x 0.1cm depth; 1.068cm^2 area and 0.107cm^3 volume. There is Fat Layer (Subcutaneous Tissue) exposed. There is no tunneling or undermining noted. There is a medium amount of serosanguineous drainage noted. The wound margin is distinct with the outline attached to the wound base. There is  small (1-33%) red granulation within the wound bed. There is a large (67-100%) amount of necrotic tissue within the wound bed including Adherent Slough. The periwound skin appearance had no abnormalities noted for texture. The periwound skin appearance had no abnormalities noted for moisture. The periwound skin appearance had no abnormalities noted for color. Periwound temperature was noted as No Abnormality. The periwound has tenderness on palpation. Assessment Active Problems ICD-10 Non-pressure chronic ulcer of other part of left lower leg with fat layer exposed Chronic venous hypertension (idiopathic) with ulcer and inflammation of left lower extremity Essential (primary) hypertension Erin [name], Erin [name] ([id number]) [id number] Page 7 of 9 Procedures Wound #1 Pre-procedure diagnosis of Wound #1 is a Venous Leg Ulcer located on the Left,Medial Lower Leg .Severity of Tissue Pre Debridement is: Fat layer exposed. There was a Selective/Open Wound Non-Viable Tissue Debridement with a total area of 1.07 sq cm performed by Erin Guess, MD. With the following instrument(s): Curette to remove Non-Viable tissue/material. Material  removed includes Eschar and Slough and after achieving pain control using Lidocaine 4% Topical Solution. No specimens were taken. A time out was conducted at 14:15, prior to the start of the procedure. A Minimum amount of bleeding was controlled with Pressure. The procedure was tolerated well with a pain level of 3 throughout and a pain level of 2 following the procedure. Post Debridement Measurements: 1.7cm length x 0.8cm width x 0.1cm depth; 0.107cm^3 volume. Character of Wound/Ulcer Post Debridement is improved. Severity of Tissue Post Debridement is: Fat layer exposed. Post procedure Diagnosis Wound #1: Same as Pre-Procedure General Notes: scribed for Erin [name] by Erin Deed, RN. Plan Follow-up Appointments: Return Appointment in 1 week. - Erin [name] - room 1 Tuesday 9/10 @ 2:00 pm Anesthetic: (In clinic) Topical Lidocaine 4% applied to wound bed Bathing/ Shower/ Hygiene: May shower with protection but do not get wound dressing(s) wet. Protect dressing(s) with water repellant cover (for example, large plastic bag) or a cast cover and may then take shower. Edema Control - Lymphedema / SCD / Other: Elevate legs to the level of the heart or above for 30 minutes daily and/or when sitting for 3-4 times a day throughout the day. Avoid standing for long periods of time. Exercise regularly WOUND #1: - Lower Leg Wound Laterality: Left, Medial Cleanser: Soap and Water 1 x Per Week/30 Days Discharge Instructions: May shower and wash wound with dial antibacterial soap and water prior to dressing change. Cleanser: Wound Cleanser 1 x Per Week/30 Days Discharge Instructions: Cleanse the wound with wound cleanser prior to applying a clean dressing using gauze sponges, not tissue or cotton balls. Peri-Wound Care: Sween Lotion (Moisturizing lotion) 1 x Per Week/30 Days Discharge Instructions: Apply moisturizing lotion as directed Topical: Gentamicin 1 x Per Week/30 Days Discharge Instructions: As  directed by physician Topical: Mupirocin Ointment 1 x Per Week/30 Days Discharge Instructions: Apply Mupirocin (Bactroban) as instructed Prim Dressing: Promogran Prisma Matrix, 4.34 (sq in) (silver collagen) 1 x Per Week/30 Days ary Discharge Instructions: Moisten collagen with saline or hydrogel Secondary Dressing: Optifoam Non-Adhesive Dressing, 4x4 in 1 x Per Week/30 Days Discharge Instructions: Apply over primary dressing as directed. Secured With: Elastic Bandage 4 inch (ACE bandage) 1 x Per Week/30 Days Discharge Instructions: Secure with ACE bandage as directed. Secured With: [employer], 4.5x3.1 (in/yd) 1 x Per Week/30 Days Discharge Instructions: Secure with Kerlix as directed. 03/10/2023: The wound is smaller again today. Moisture balance is good. Light slough accumulation on the fibrotic surface. Edema is  well-controlled. I used a curette to debride slough from the wound surface. I was fairly aggressive with my debridement today and effort to stimulate healing cascade. We will continue the mixture of topical gentamicin and mupirocin with Prisma silver collagen, triamcinolone to the periwound, additional hydrogel on the wound surface and an Optifoam cover to retain moisture. Continue Kerlix and Ace bandage for compression. Follow-up in 1 week. Electronic Signature(s) Signed: 03/10/2023 2:42:01 PM By: Erin Guess MD FACS Entered By: Erin [name] on 03/10/2023 11:42:01 -------------------------------------------------------------------------------- HxROS Details Patient Name: Date of Service: [name], [name] 03/10/2023 2:00 PM Medical Record Number: [id number] Patient Account Number: [id number] Date of Birth/Sex: Treating RN: 27-Sep-1940 (82 y.o. Erin [name] Primary Care Provider: Hoyle [name] Other Clinician: Referring Provider: Treating Provider/Extender: [name], Frederick Peers in Treatment: [address], [place] ([id number])  [id number] Page 8 of 9 Eyes Medical History: Positive for: Cataracts Hematologic/Lymphatic Medical History: Past Medical History Notes: thrombocytopenia Cardiovascular Medical History: Positive for: Arrhythmia - bundle branch block; Hypertension; Peripheral Venous Disease Gastrointestinal Medical History: Past Medical History Notes: diverticulosis, GERD Endocrine Medical History: Past Medical History Notes: hypothyroidism Genitourinary Medical History: Past Medical History Notes: CKD stage III Musculoskeletal Medical History: Positive for: Gout; Osteoarthritis Past Medical History Notes: DJD, osteoporosis Oncologic Medical History: Past Medical History Notes: breast cancer HBO Extended History Items Eyes: Cataracts Immunizations Pneumococcal Vaccine: Received Pneumococcal Vaccination: Yes Received Pneumococcal Vaccination On or After 60th Birthday: No Implantable Devices No devices added Hospitalization / Surgery History Type of Hospitalization/Surgery Salpingoophorectomy Colon resection Appendectomy Cystoscopy w/ retrogrades Joint replacement Mastectomy right gastric ulcer repair Abdominal hysterectomy Family and Social History Cancer: Yes - Siblings; Diabetes: No; Heart Disease: No; Hereditary Spherocytosis: No; Hypertension: No; Kidney Disease: No; Lung Disease: Yes - Siblings; Seizures: No; Stroke: No; Thyroid Problems: No; Tuberculosis: No; Never smoker; Marital Status - Widowed; Alcohol Use: Never; Drug Use: No History; Caffeine Use: Moderate; Financial Concerns: No; Food, Clothing or Shelter Needs: No; Support System Lacking: No; Transportation Concerns: No Electronic Signature(s) Erin [name], Erin [name] ([id number]) [id number] Page 9 of 9 Signed: 03/10/2023 3:32:48 PM By: Erin Guess MD FACS Signed: 03/10/2023 5:02:17 PM By: Erin Deed RN, BSN Entered By: Erin [name] on 03/10/2023  11:35:59 -------------------------------------------------------------------------------- SuperBill Details Patient Name: Date of Service: [name], [name] 03/10/2023 Medical Record Number: [id number] Patient Account Number: [id number] Date of Birth/Sex: Treating RN: [birth year] (82 y.o. Erin [name] Primary Care Provider: Hoyle [name] Other Clinician: Referring Provider: Treating Provider/Extender: Erin [name] in Treatment: 14 Diagnosis Coding ICD-10 Codes Code Description [phone] Non-pressure chronic ulcer of other part of left lower leg with fat layer exposed I87.332 Chronic venous hypertension (idiopathic) with ulcer and inflammation of left lower extremity I10 Essential (primary) hypertension Facility Procedures : CPT4 Code: [id number] Description: 97597 - DEBRIDE WOUND 1ST 20 SQ CM OR < ICD-10 Diagnosis Description L97.822 Non-pressure chronic ulcer of other part of left lower leg with fat layer exposed Modifier: Quantity: 1 Physician Procedures : CPT4 Code Description Modifier [id number] - WC PHYS LEVEL 4 - EST PT 25 ICD-10 Diagnosis Description L97.822 Non-pressure chronic ulcer of other part of left lower leg with fat layer exposed I87.332 Chronic venous hypertension (idiopathic) with  ulcer and inflammation of left lower extremity I10 Essential (primary) hypertension Quantity: 1 : [id number] 97597 - WC PHYS DEBR WO ANESTH 20 SQ CM ICD-10 Diagnosis Description L97.822 Non-pressure chronic ulcer of other part of left lower leg with  fat layer exposed Quantity: 1 Electronic Signature(s) Signed: 03/10/2023 2:45:52 PM By: Erin Guess MD FACS Entered By: Erin [name] on 03/10/2023 11:45:51

## 2023-03-17 ENCOUNTER — Encounter (HOSPITAL_BASED_OUTPATIENT_CLINIC_OR_DEPARTMENT_OTHER): Payer: Medicare HMO | Admitting: General Surgery

## 2023-03-17 DIAGNOSIS — N183 Chronic kidney disease, stage 3 unspecified: Secondary | ICD-10-CM | POA: Diagnosis not present

## 2023-03-17 DIAGNOSIS — I87332 Chronic venous hypertension (idiopathic) with ulcer and inflammation of left lower extremity: Secondary | ICD-10-CM | POA: Diagnosis not present

## 2023-03-17 DIAGNOSIS — I129 Hypertensive chronic kidney disease with stage 1 through stage 4 chronic kidney disease, or unspecified chronic kidney disease: Secondary | ICD-10-CM | POA: Diagnosis not present

## 2023-03-17 DIAGNOSIS — L97822 Non-pressure chronic ulcer of other part of left lower leg with fat layer exposed: Secondary | ICD-10-CM | POA: Diagnosis not present

## 2023-03-17 DIAGNOSIS — I872 Venous insufficiency (chronic) (peripheral): Secondary | ICD-10-CM | POA: Diagnosis not present

## 2023-03-17 NOTE — Progress Notes (Signed)
Erin [name], Erin [name] ([id number]) [id number] Page 1 of 8 Visit Report for 03/17/2023 Arrival Information Details Patient Name: Date of Service: [name], [name] RBA RA N. 03/17/2023 2:00 PM Medical Record Number: [id number] Patient Account Number: [id number] Date of Birth/Sex: Treating RN: 1940/09/26 (82 y.o. F) Primary Care Kiani Wurtzel: [name] Other Clinician: Referring Eleana Tocco: Treating Grasiela Jonsson/Extender: [name] Weeks in Treatment: 15 Visit Information History Since Last Visit All ordered tests and consults were completed: No Patient Arrived: Ambulatory Added or deleted any medications: No Arrival Time: 14:15 Any new allergies or adverse reactions: No Accompanied By: daughter Had a fall or experienced change in No Transfer Assistance: None activities of daily living that may affect Patient Identification Verified: Yes risk of falls: Secondary Verification Process Completed: Yes Signs or symptoms of abuse/neglect since last visito No Patient Requires Transmission-Based Precautions: No Hospitalized since last visit: No Patient Has Alerts: No Implantable device outside of the clinic excluding No cellular tissue based products placed in the center since last visit: Has Dressing in Place as Prescribed: Yes Has Compression in Place as Prescribed: Yes Pain Present Now: No Electronic Signature(s) Signed: 03/17/2023 4:43:06 PM By: Zenaida Deed RN, BSN Entered By: Zenaida Deed on 03/17/2023 11:32:10 -------------------------------------------------------------------------------- Encounter Discharge Information Details Patient Name: Date of Service: [name], [name] 03/17/2023 2:00 PM Medical Record Number: [id number] Patient Account Number: [id number] Date of Birth/Sex: Treating RN: 1941-06-24 (82 y.o. [name] Primary Care Donald Jacque: [name] Other Clinician: Referring Jericho Cieslik: Treating  Cheveyo [place]/Extender: [name] Weeks in Treatment: 15 Encounter Discharge Information Items Post Procedure Vitals Discharge Condition: Stable Temperature (F): 98.2 Ambulatory Status: Ambulatory Pulse (bpm): 74 Discharge Destination: Home Respiratory Rate (breaths/min): 18 Transportation: Private Auto Blood Pressure (mmHg): 199/74 Accompanied By: self Schedule Follow-up Appointment: Yes Clinical Summary of Care: Patient Declined Electronic Signature(s) Signed: 03/17/2023 4:43:06 PM By: Zenaida Deed RN, BSN Entered By: Zenaida Deed on 03/17/2023 12:01:38 Erin [name] ([id number]) [id number] Page 2 of 8 -------------------------------------------------------------------------------- Lower Extremity Assessment Details Patient Name: Date of Service: [name], [name] 03/17/2023 2:00 PM Medical Record Number: [id number] Patient Account Number: [id number] Date of Birth/Sex: Treating RN: 12-24-1940 (82 y.o. [name] Primary Care Toleen Lachapelle: [name] Other Clinician: Referring Yonis Carreon: Treating Severino Paolo/Extender: [name], [name] in Treatment: 15 Edema Assessment Assessed: [Left: No] [Right: No] Edema: [Left: Ye] [Right: s] Calf Left: Right: Point of Measurement: From Medial Instep 27.5 cm Ankle Left: Right: Point of Measurement: From Medial Instep 18.5 cm Vascular Assessment Pulses: Dorsalis Pedis Palpable: [Left:Yes] Extremity colors, hair growth, and conditions: Extremity Color: [Left:Hyperpigmented] Hair Growth on Extremity: [Left:No] Temperature of Extremity: [Left:Warm < 3 seconds] Electronic Signature(s) Signed: 03/17/2023 4:43:06 PM By: Zenaida Deed RN, BSN Entered By: Zenaida Deed on 03/17/2023 11:34:40 -------------------------------------------------------------------------------- Multi Wound Chart Details Patient Name: Date of Service: [name], BA  RBA RA N. 03/17/2023 2:00 PM Medical Record Number: [id number] Patient Account Number: [id number] Date of Birth/Sex: Treating RN: 1941-04-04 (82 y.o. [name] Primary Care Levert Heslop: [name] Other Clinician: Referring Mariame Rybolt: Treating Brithney Bensen/Extender: [name], [name] in Treatment: 15 Vital Signs Height(in): 60 Pulse(bpm): 74 Weight(lbs): 138 Blood Pressure(mmHg): 199/74 Body Mass Index(BMI): 26.9 Temperature(F): 98.2 Respiratory Rate(breaths/min): 18 [1:Photos:] [N/A:N/A] Left, Medial Lower Leg N/A N/A Wound Location: Gradually Appeared N/A N/A Wounding Event: Venous Leg Ulcer N/A N/A Primary Etiology: Cataracts, Arrhythmia, Hypertension, N/A N/A Comorbid History: Peripheral Venous  Disease, Gout, Osteoarthritis 08/29/2022 N/A N/A Date Acquired: 15 N/A N/A Weeks of Treatment: Open N/A N/A Wound Status: No N/A N/A Wound Recurrence: 1.4x0.6x0.1 N/A N/A Measurements L x W x D (cm) 0.66 N/A N/A A (cm) : rea 0.066 N/A N/A Volume (cm) : 88.80% N/A N/A % Reduction in A rea: 88.80% N/A N/A % Reduction in Volume: Full Thickness Without Exposed N/A N/A Classification: Support Structures Medium N/A N/A Exudate A mount: Serosanguineous N/A N/A Exudate Type: red, brown N/A N/A Exudate Color: Distinct, outline attached N/A N/A Wound Margin: Small (1-33%) N/A N/A Granulation A mount: Red N/A N/A Granulation Quality: Large (67-100%) N/A N/A Necrotic A mount: Fat Layer (Subcutaneous Tissue): Yes N/A N/A Exposed Structures: Fascia: No Tendon: No Muscle: No Joint: No Bone: No Small (1-33%) N/A N/A Epithelialization: Debridement - Excisional N/A N/A Debridement: Pre-procedure Verification/Time Out 14:40 N/A N/A Taken: Lidocaine 4% Topical Solution N/A N/A Pain Control: Necrotic/Eschar, Subcutaneous, N/A N/A Tissue Debrided: Slough Skin/Subcutaneous Tissue N/A N/A Level: 0.66 N/A N/A Debridement A (sq  cm): rea Curette N/A N/A Instrument: Minimum N/A N/A Bleeding: Pressure N/A N/A Hemostasis Achieved: 3 N/A N/A Procedural Pain: 1 N/A N/A Post Procedural Pain: Debridement Treatment Response: Procedure was tolerated well N/A N/A Post Debridement Measurements L x 1.4x0.6x0.1 N/A N/A W x D (cm) 0.066 N/A N/A Post Debridement Volume: (cm) No Abnormalities Noted N/A N/A Periwound Skin Texture: No Abnormalities Noted N/A N/A Periwound Skin Moisture: No Abnormalities Noted N/A N/A Periwound Skin Color: No Abnormality N/A N/A Temperature: Yes N/A N/A Tenderness on Palpation: Debridement N/A N/A Procedures Performed: Treatment Notes Wound #1 (Lower Leg) Wound Laterality: Left, Medial Cleanser Soap and Water Discharge Instruction: May shower and wash wound with dial antibacterial soap and water prior to dressing change. Wound Cleanser Discharge Instruction: Cleanse the wound with wound cleanser prior to applying a clean dressing using gauze sponges, not tissue or cotton balls. Peri-Wound Care Triamcinolone 15 (g) Discharge Instruction: Use triamcinolone 15 (g) as directed Sween Lotion (Moisturizing lotion) Discharge Instruction: Apply moisturizing lotion as directed Topical Gentamicin Discharge Instruction: As directed by physician Mupirocin Ointment Discharge Instruction: Apply Mupirocin (Bactroban) as instructed Erin [name], Erin [name] ([id number]) [id number] Page 4 of 8 Primary Dressing PolyMem Non-Adhesive Dressing, 4x4 in Discharge Instruction: Apply to wound bed as instructed Secondary Dressing Secured With Elastic Bandage 4 inch (ACE bandage) Discharge Instruction: Secure with ACE bandage as directed. Kerlix Roll Sterile, 4.5x3.1 (in/yd) Discharge Instruction: Secure with Kerlix as directed. Compression Wrap Compression Stockings Add-Ons Electronic Signature(s) Signed: 03/17/2023 3:05:05 PM By: Duanne Guess MD FACS Signed: 03/17/2023  4:43:06 PM By: Zenaida Deed RN, BSN Entered By: Duanne Guess on 03/17/2023 12:05:05 -------------------------------------------------------------------------------- Multi-Disciplinary Care Plan Details Patient Name: Date of Service: [name], [name] 03/17/2023 2:00 PM Medical Record Number: [id number] Patient Account Number: [id number] Date of Birth/Sex: Treating RN: Aug 05, 1940 (82 y.o. [name] Primary Care Haidee Stogsdill: [name] Other Clinician: Referring Gola Bribiesca: Treating Zoe Nordin/Extender: [name] in Treatment: 15 Multidisciplinary Care Plan reviewed with physician Active Inactive Necrotic Tissue Nursing Diagnoses: Impaired tissue integrity related to necrotic/devitalized tissue Knowledge deficit related to management of necrotic/devitalized tissue Goals: Necrotic/devitalized tissue will be minimized in the wound bed Date Initiated: 11/26/2022 Target Resolution Date: 05/06/2023 Goal Status: Active Patient/caregiver will verbalize understanding of reason and process for debridement of necrotic tissue Date Initiated: 11/26/2022 Date Inactivated: 01/20/2023 Target Resolution Date: 03/06/2023 Goal Status: Met Interventions: Assess patient pain level pre-, during and post procedure and prior  to discharge Provide education on necrotic tissue and debridement process Treatment Activities: Apply topical anesthetic as ordered : 11/26/2022 Notes: Wound/Skin Impairment Nursing Diagnoses: Impaired tissue integrity Knowledge deficit related to ulceration/compromised skin integrity Goals: Erin [name], Erin [name] ([id number]) [phone].pdf Page 5 of 8 Patient/caregiver will verbalize understanding of skin care regimen Date Initiated: 11/26/2022 Target Resolution Date: 05/06/2023 Goal Status: Active Interventions: Assess ulceration(s) every visit Treatment Activities: Skin care regimen initiated :  11/26/2022 Topical wound management initiated : 11/26/2022 Notes: Electronic Signature(s) Signed: 03/17/2023 4:43:06 PM By: Zenaida Deed RN, BSN Entered By: Zenaida Deed on 03/17/2023 11:36:19 -------------------------------------------------------------------------------- Pain Assessment Details Patient Name: Date of Service: [name], [name] 03/17/2023 2:00 PM Medical Record Number: [id number] Patient Account Number: [id number] Date of Birth/Sex: Treating RN: [birth year] (82 y.o. F) Primary Care Aislee Landgren: [name] Other Clinician: Referring Monquie Fulgham: Treating Cassadee Vanzandt/Extender: [name], [name] in Treatment: 15 Active Problems Location of Pain Severity and Description of Pain Patient Has Paino No Site Locations Rate the pain. Current Pain Level: 0 Pain Management and Medication Current Pain Management: Electronic Signature(s) Signed: 03/17/2023 4:43:06 PM By: Zenaida Deed RN, BSN Entered By: Zenaida Deed on 03/17/2023 11:32:27 Patient/Caregiver Education Details -------------------------------------------------------------------------------- Erin [name] ([id number]) [id number] Page 6 of 8 Patient Name: Date of Service: [name], [name] 9/10/2024andnbsp2:00 PM Medical Record Number: [id number] Patient Account Number: [id number] Date of Birth/Gender: Treating RN: Oct 21, 1940 (82 y.o. [name] Primary Care Physician: [name] Other Clinician: Referring Physician: Treating Physician/Extender: [name] in Treatment: 15 Education Assessment Education Provided To: Patient Education Topics Provided Venous: Methods: Explain/Verbal Responses: Reinforcements needed, State content correctly Wound/Skin Impairment: Methods: Explain/Verbal Responses: Reinforcements needed, State content correctly Electronic Signature(s) Signed: 03/17/2023 4:43:06  PM By: Zenaida Deed RN, BSN Entered By: Zenaida Deed on 03/17/2023 11:36:50 -------------------------------------------------------------------------------- Wound Assessment Details Patient Name: Date of Service: [name], [name] 03/17/2023 2:00 PM Medical Record Number: [id number] Patient Account Number: [id number] Date of Birth/Sex: Treating RN: 27-Sep-1940 (82 y.o. [name] Primary Care Zenas Santa: [name] Other Clinician: Referring Voyd Groft: Treating Kista Robb/Extender: [name], [name] in Treatment: 15 Wound Status Wound Number: 1 Primary Venous Leg Ulcer Etiology: Wound Location: Left, Medial Lower Leg Wound Open Wounding Event: Gradually Appeared Status: Date Acquired: 08/29/2022 Comorbid Cataracts, Arrhythmia, Hypertension, Peripheral Venous Weeks Of Treatment: 15 History: Disease, Gout, Osteoarthritis Clustered Wound: No Photos Wound Measurements Length: (cm) 1.4 Width: (cm) 0.6 Depth: (cm) 0.1 Area: (cm) 0.6 Volume: (cm) 0.0 Erin [name], Erin [name] ([id number]) Wound Description Classification: Full Thickness Without Exposed Support Wound Margin: Distinct, outline attached Exudate Amount: Medium Exudate Type: Serosanguineous Exudate Color: red, brown Foul Odor After Cleansing: Slough/Fibrino % Reduction in Area: 88.8% % Reduction in Volume: 88.8% Epithelialization: Small (1-33%) 6 Tunneling: No 66 Undermining: No [phone].pdf Page 7 of 8 Structures No Yes Wound Bed Granulation Amount: Small (1-33%) Exposed Structure Granulation Quality: Red Fascia Exposed: No Necrotic Amount: Large (67-100%) Fat Layer (Subcutaneous Tissue) Exposed: Yes Necrotic Quality: Adherent Slough Tendon Exposed: No Muscle Exposed: No Joint Exposed: No Bone Exposed: No Periwound Skin Texture Texture Color No Abnormalities Noted: Yes No Abnormalities Noted: Yes Moisture Temperature / Pain No  Abnormalities Noted: Yes Temperature: No Abnormality Tenderness on Palpation: Yes Treatment Notes Wound #1 (Lower Leg) Wound Laterality: Left, Medial Cleanser Soap and Water Discharge Instruction: May shower and wash wound with dial antibacterial soap and water prior to dressing change. Wound  Cleanser Discharge Instruction: Cleanse the wound with wound cleanser prior to applying a clean dressing using gauze sponges, not tissue or cotton balls. Peri-Wound Care Triamcinolone 15 (g) Discharge Instruction: Use triamcinolone 15 (g) as directed Sween Lotion (Moisturizing lotion) Discharge Instruction: Apply moisturizing lotion as directed Topical Gentamicin Discharge Instruction: As directed by physician Mupirocin Ointment Discharge Instruction: Apply Mupirocin (Bactroban) as instructed Primary Dressing PolyMem Non-Adhesive Dressing, 4x4 in Discharge Instruction: Apply to wound bed as instructed Secondary Dressing Secured With Elastic Bandage 4 inch (ACE bandage) Discharge Instruction: Secure with ACE bandage as directed. Kerlix Roll Sterile, 4.5x3.1 (in/yd) Discharge Instruction: Secure with Kerlix as directed. Compression Wrap Compression Stockings Add-Ons Electronic Signature(s) Signed: 03/17/2023 4:43:06 PM By: Zenaida Deed RN, BSN Entered By: Zenaida Deed on 03/17/2023 11:35:18 Erin [name] ([id number]) [phone].pdf Page 8 of 8 -------------------------------------------------------------------------------- Vitals Details Patient Name: Date of Service: [name], [name] 03/17/2023 2:00 PM Medical Record Number: [id number] Patient Account Number: [id number] Date of Birth/Sex: Treating RN: Oct 12, 1940 (82 y.o. F) Primary Care Martyn Timme: [name] Other Clinician: Referring Shannan Garfinkel: Treating Chloeanne Poteet/Extender: [name], [name] in Treatment: 15 Vital Signs Time Taken: 02:15 Temperature (F): 98.2 Height  (in): 60 Pulse (bpm): 74 Weight (lbs): 138 Respiratory Rate (breaths/min): 18 Body Mass Index (BMI): 26.9 Blood Pressure (mmHg): 199/74 Reference Range: 80 - 120 mg / dl Electronic Signature(s) Signed: 03/17/2023 4:43:06 PM By: Zenaida Deed RN, BSN Entered By: Zenaida Deed on 03/17/2023 11:32:17

## 2023-03-17 NOTE — Progress Notes (Signed)
Erin [name], Erin [name] ([id number]) [id number] Page 1 of 9 Visit Report for 03/17/2023 Chief Complaint Document Details Patient Name: Date of Service: [name], [name] RBA RA N. 03/17/2023 2:00 PM Medical Record Number: [id number] Patient Account Number: [id number] Date of Birth/Sex: Treating RN: 03-Dec-1940 (82 y.o. Erin [name] Primary Care Provider: [name] Other Clinician: Referring Provider: Treating Provider/Extender: [name] Weeks in Treatment: 15 Information Obtained from: Patient Chief Complaint Patient presents for treatment of an open ulcer due to venous insufficiency Electronic Signature(s) Signed: 03/17/2023 3:05:23 PM By: Duanne Guess MD FACS Entered By: Duanne Guess on 03/17/2023 12:05:23 -------------------------------------------------------------------------------- Debridement Details Patient Name: Date of Service: [name], [name] 03/17/2023 2:00 PM Medical Record Number: [id number] Patient Account Number: [id number] Date of Birth/Sex: Treating RN: 04/30/1941 (82 y.o. Erin [name] Primary Care Provider: [name] Other Clinician: Referring Provider: Treating Provider/Extender: [name] Weeks in Treatment: 15 Debridement Performed for Assessment: Wound #1 Left,Medial Lower Leg Performed By: Physician Duanne Guess, MD The following information was scribed by: Zenaida Deed The information was scribed for: Duanne Guess Debridement Type: Debridement Severity of Tissue Pre Debridement: Fat layer exposed Level of Consciousness (Pre-procedure): Awake and Alert Pre-procedure Verification/Time Out Yes - 14:40 Taken: Start Time: 14:42 Pain Control: Lidocaine 4% T opical Solution Percent of Wound Bed Debrided: 100% T Area Debrided (cm): otal 0.66 Tissue and other material debrided: Viable, Non-Viable, Eschar, Slough, Subcutaneous, Slough Level:  Skin/Subcutaneous Tissue Debridement Description: Excisional Instrument: Curette Bleeding: Minimum Hemostasis Achieved: Pressure Procedural Pain: 3 Post Procedural Pain: 1 Response to Treatment: Procedure was tolerated well Level of Consciousness (Post- Awake and Alert procedure): Post Debridement Measurements of Total Wound Length: (cm) 1.4 Width: (cm) 0.6 Depth: (cm) 0.1 Volume: (cm) 0.066 Erin [name], Erin [name] ([id number]) [phone].pdf Page 2 of 9 Character of Wound/Ulcer Post Debridement: Improved Severity of Tissue Post Debridement: Fat layer exposed Post Procedure Diagnosis Same as Pre-procedure Electronic Signature(s) Signed: 03/17/2023 3:58:21 PM By: Duanne Guess MD FACS Signed: 03/17/2023 4:43:06 PM By: Zenaida Deed RN, BSN Entered By: Zenaida Deed on 03/17/2023 11:45:29 -------------------------------------------------------------------------------- HPI Details Patient Name: Date of Service: [name], [name] 03/17/2023 2:00 PM Medical Record Number: [id number] Patient Account Number: [id number] Date of Birth/Sex: Treating RN: 1941-01-10 (82 y.o. Erin [name] Primary Care Provider: [name] Other Clinician: Referring Provider: Treating Provider/Extender: [name] Weeks in Treatment: 15 History of Present Illness HPI Description: ADMISSION 11/26/2022 This is an 82 year old woman who was referred by her primary care provider for a persistent nonhealing wound on her left lower extremity. It has been present since February of this year. 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 I do not see any formal venous reflux studies in the electronic medical record. She is not diabetic. She does not smoke. Her PCP has been washing her leg each week and applying a Kerlix and Coban wrap, but has not performed any  formal debridement and has not been using any sort of topical contact layer or ointment. ABI in clinic was noncompressible, but she has a palpable pedal pulse. 12/10/2022: The wound is a bit smaller and cleaner today. There is still fairly heavy slough on the surface. Edema control is acceptable. 12/16/2022: The wound is measuring the slightest bit smaller today. There is still thick slough on the surface. Edema control is good. 12/23/2022:  The wound is a little bit smaller again today. Still with fairly heavy slough accumulation. Edema control is good. She has a new small wound on the proximal portion of the same leg. It looks as though she may have developed a small blister over the weekend subsequently ruptured. There is minimal slough on the surface. 12/30/2022: The wound is about the same size, but substantially cleaner. There is some hypertrophic granulation tissue starting to emerge. The wound that was new last week has closed but she has a different new wound today on the lateral aspect of her left lower leg. It also appears to have been a blister that opened and ruptured. There is a little bit of slough on the surface. 01/06/2023: She has new wounds today. She has a wound on the PIP knuckle of her left third and fourth toes, as well as an anterior tibial wound. She says that the wrap was too tight and it also looks like when she pushes her foot into her shoe, it shifts the wrapping back to her midfoot causing bunching up of the wrap material and pain. The lateral leg wounds are both smaller with a little slough and eschar present. The original medial leg wound is smaller and more superficial. There is slough on the surface. 01/13/2023: She has new wounds today. The anterior tibial wound has a satellite lesion that has opened up adjacent to it. Has additional wounds on her fifth toe in addition to the existing ones on her third and fourth toe. The lateral leg wounds seem to have healed. The original  medial leg wound is also smaller, but she and her daughter have several questions regarding why we are not making forward progress, all of which are very reasonable. 01/20/2023: The wounds on her toes have healed. There is a tiny wound on the dorsum of her foot with a little bit of slough on it. The anterolateral leg wound has epithelialized quite substantially and the satellite that had opened last week is now closed. The medial leg wound measured narrower today. It has hypertrophic granulation tissue and slough present. 01/27/2023: The small wounds have all healed. The medial leg wound is smaller as well, with some slough on the surface but without reaccumulation of the hypertrophic granulation tissue. 02/03/2023: The only remaining open wound is the left medial leg wound. It is smaller and a bit more superficial. There is still slough accumulation on the surface. No hypertrophic granulation tissue. She had both her venous reflux and lower extremity arterial studies, both of which were essentially normal, although her TBI's are slightly diminished. 02/10/2023: The left medial leg wound has some granulation tissue filling in, but overall, it is still fairly fibrotic. There is some slough on the surface today. 02/18/2023: The moisture balance and surface consistency have both improved. There is some slough over granulation tissue. Edema control is good. 02/24/2023: The wound measured smaller today. Moisture balance is good. Slough has reaccumulated. Edema remains well-controlled. 03/03/2023: The wound is a little bit smaller today. Moisture balance is stable. Minimal slough accumulation. There is more granulation tissue filling in over the fibrotic surface. Edema is well-controlled. 03/10/2023: The wound is smaller again today. Moisture balance is good. Light slough accumulation on the fibrotic surface. Edema is well-controlled. 03/17/2023: The wound is slightly smaller again today. There is minimal slough  accumulation but the underlying surface remains quite fibrotic. Edema control is good. Erin [name], Erin [name] ([id number]) [id number] Page 3 of 9 Electronic Signature(s) Signed: 03/17/2023 3:06:10 PM By:  Duanne Guess MD FACS Entered By: Duanne Guess on 03/17/2023 12:06:10 -------------------------------------------------------------------------------- Physical Exam Details Patient Name: Date of Service: [name], [name] 03/17/2023 2:00 PM Medical Record Number: [id number] Patient Account Number: [id number] Date of Birth/Sex: Treating RN: [birth year] (82 y.o. Erin [name] Primary Care Provider: [name] Other Clinician: Referring Provider: Treating Provider/Extender: [name], [name] in Treatment: 15 Constitutional Hypertensive, asymptomatic. . . . no acute distress. Respiratory Normal work of breathing on room air. Notes 03/17/2023: The wound is slightly smaller again today. There is minimal slough accumulation but the underlying surface remains quite fibrotic. Edema control is good. Electronic Signature(s) Signed: 03/17/2023 3:09:00 PM By: Duanne Guess MD FACS Entered By: Duanne Guess on 03/17/2023 12:08:59 -------------------------------------------------------------------------------- Physician Orders Details Patient Name: Date of Service: [name], [name] 03/17/2023 2:00 PM Medical Record Number: [id number] Patient Account Number: [id number] Date of Birth/Sex: Treating RN: [birth year] (82 y.o. Erin [name] Primary Care Provider: [name] Other Clinician: Referring Provider: Treating Provider/Extender: [name] Weeks in Treatment: 15 The following information was scribed by: Zenaida Deed The information was scribed for: Duanne Guess Verbal / Phone Orders: No Diagnosis Coding ICD-10 Coding Code Description [phone] Non-pressure chronic ulcer  of other part of left lower leg with fat layer exposed I87.332 Chronic venous hypertension (idiopathic) with ulcer and inflammation of left lower extremity I10 Essential (primary) hypertension Follow-up Appointments ppointment in 1 week. - Dr. Lady Gary - room 1 Return A Tuesday 9/17 @ 2:00 pm Anesthetic (In clinic) Topical Lidocaine 4% applied to wound bed Bathing/ Shower/ Hygiene May shower with protection but do not get wound dressing(s) wet. Protect dressing(s) with water repellant cover (for example, large plastic CASIDY, TAUBMAN ([id number]) [id number] Page 4 of 9 bag) or a cast cover and may then take shower. Edema Control - Lymphedema / SCD / Other Elevate legs to the level of the heart or above for 30 minutes daily and/or when sitting for 3-4 times a day throughout the day. Avoid standing for long periods of time. Exercise regularly Wound Treatment Wound #1 - Lower Leg Wound Laterality: Left, Medial Cleanser: Soap and Water 1 x Per Week/30 Days Discharge Instructions: May shower and wash wound with dial antibacterial soap and water prior to dressing change. Cleanser: Wound Cleanser 1 x Per Week/30 Days Discharge Instructions: Cleanse the wound with wound cleanser prior to applying a clean dressing using gauze sponges, not tissue or cotton balls. Peri-Wound Care: Triamcinolone 15 (g) 1 x Per Week/30 Days Discharge Instructions: Use triamcinolone 15 (g) as directed Peri-Wound Care: Sween Lotion (Moisturizing lotion) 1 x Per Week/30 Days Discharge Instructions: Apply moisturizing lotion as directed Topical: Gentamicin 1 x Per Week/30 Days Discharge Instructions: As directed by physician Topical: Mupirocin Ointment 1 x Per Week/30 Days Discharge Instructions: Apply Mupirocin (Bactroban) as instructed Prim Dressing: PolyMem Non-Adhesive Dressing, 4x4 in 1 x Per Week/30 Days ary Discharge Instructions: Apply to wound bed as instructed Secured With:  Elastic Bandage 4 inch (ACE bandage) 1 x Per Week/30 Days Discharge Instructions: Secure with ACE bandage as directed. Secured With: [employer], 4.5x3.1 (in/yd) 1 x Per Week/30 Days Discharge Instructions: Secure with Kerlix as directed. Electronic Signature(s) Signed: 03/17/2023 3:58:21 PM By: Duanne Guess MD FACS Entered By: Duanne Guess on 03/17/2023 12:14:02 -------------------------------------------------------------------------------- Problem List Details Patient Name: Date of Service: [name], [name] 03/17/2023 2:00 PM Medical Record Number: [id number] Patient Account Number: [id number]  Date of Birth/Sex: Treating RN: 1941/04/26 (82 y.o. Erin [name] Primary Care Provider: [name] Other Clinician: Referring Provider: Treating Provider/Extender: [name] Weeks in Treatment: 15 Active Problems ICD-10 Encounter Code Description Active Date MDM Diagnosis [phone] Non-pressure chronic ulcer of other part of left lower leg with fat layer exposed5/22/2024 No Yes I87.332 Chronic venous hypertension (idiopathic) with ulcer and inflammation of left 11/26/2022 No Yes lower extremity I10 Essential (primary) hypertension 11/26/2022 No Yes RHAEGAN, BRANDIN ([id number]) [phone].pdf Page 5 of 9 Inactive Problems Resolved Problems ICD-10 Code Description Active Date Resolved Date L97.522 Non-pressure chronic ulcer of other part of left foot with fat layer exposed 01/13/2023 01/13/2023 Electronic Signature(s) Signed: 03/17/2023 2:54:40 PM By: Duanne Guess MD FACS Entered By: Duanne Guess on 03/17/2023 11:54:40 -------------------------------------------------------------------------------- Progress Note Details Patient Name: Date of Service: [name], [name] 03/17/2023 2:00 PM Medical Record Number: [id number] Patient Account Number: [id number] Date of Birth/Sex: Treating RN: [birth year] (82  y.o. Erin [name] Primary Care Provider: [name] Other Clinician: Referring Provider: Treating Provider/Extender: [name] Weeks in Treatment: 15 Subjective Chief Complaint Information obtained from Patient Patient presents for treatment of an open ulcer due to venous insufficiency History of Present Illness (HPI) ADMISSION 11/26/2022 This is an 82 year old woman who was referred by her primary care provider for a persistent nonhealing wound on her left lower extremity. It has been present since February of this year. 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 I do not see any formal venous reflux studies in the electronic medical record. She is not diabetic. She does not smoke. Her PCP has been washing her leg each week and applying a Kerlix and Coban wrap, but has not performed any formal debridement and has not been using any sort of topical contact layer or ointment. ABI in clinic was noncompressible, but she has a palpable pedal pulse. 12/10/2022: The wound is a bit smaller and cleaner today. There is still fairly heavy slough on the surface. Edema control is acceptable. 12/16/2022: The wound is measuring the slightest bit smaller today. There is still thick slough on the surface. Edema control is good. 12/23/2022: The wound is a little bit smaller again today. Still with fairly heavy slough accumulation. Edema control is good. She has a new small wound on the proximal portion of the same leg. It looks as though she may have developed a small blister over the weekend subsequently ruptured. There is minimal slough on the surface. 12/30/2022: The wound is about the same size, but substantially cleaner. There is some hypertrophic granulation tissue starting to emerge. The wound that was new last week has closed but she has a different new wound today on the  lateral aspect of her left lower leg. It also appears to have been a blister that opened and ruptured. There is a little bit of slough on the surface. 01/06/2023: She has new wounds today. She has a wound on the PIP knuckle of her left third and fourth toes, as well as an anterior tibial wound. She says that the wrap was too tight and it also looks like when she pushes her foot into her shoe, it shifts the wrapping back to her midfoot causing bunching up of the wrap material and pain. The lateral leg wounds are both smaller with a little slough and eschar present. The original medial leg  wound is smaller and more superficial. There is slough on the surface. 01/13/2023: She has new wounds today. The anterior tibial wound has a satellite lesion that has opened up adjacent to it. Has additional wounds on her fifth toe in addition to the existing ones on her third and fourth toe. The lateral leg wounds seem to have healed. The original medial leg wound is also smaller, but she and her daughter have several questions regarding why we are not making forward progress, all of which are very reasonable. 01/20/2023: The wounds on her toes have healed. There is a tiny wound on the dorsum of her foot with a little bit of slough on it. The anterolateral leg wound has epithelialized quite substantially and the satellite that had opened last week is now closed. The medial leg wound measured narrower today. It has hypertrophic granulation tissue and slough present. 01/27/2023: The small wounds have all healed. The medial leg wound is smaller as well, with some slough on the surface but without reaccumulation of the hypertrophic granulation tissue. 02/03/2023: The only remaining open wound is the left medial leg wound. It is smaller and a bit more superficial. There is still slough accumulation on the surface. No hypertrophic granulation tissue. She had both her venous reflux and lower extremity arterial studies, both of  which were essentially normal, although her TBI's are slightly diminished. 02/10/2023: The left medial leg wound has some granulation tissue filling in, but overall, it is still fairly fibrotic. There is some slough on the surface today. Erin [name], Erin [name] ([id number]) [id number] Page 6 of 9 02/18/2023: The moisture balance and surface consistency have both improved. There is some slough over granulation tissue. Edema control is good. 02/24/2023: The wound measured smaller today. Moisture balance is good. Slough has reaccumulated. Edema remains well-controlled. 03/03/2023: The wound is a little bit smaller today. Moisture balance is stable. Minimal slough accumulation. There is more granulation tissue filling in over the fibrotic surface. Edema is well-controlled. 03/10/2023: The wound is smaller again today. Moisture balance is good. Light slough accumulation on the fibrotic surface. Edema is well-controlled. 03/17/2023: The wound is slightly smaller again today. There is minimal slough accumulation but the underlying surface remains quite fibrotic. Edema control is good. Patient History Family History Cancer - Siblings, Lung Disease - Siblings, No family history of Diabetes, Heart Disease, Hereditary Spherocytosis, Hypertension, Kidney Disease, Seizures, Stroke, Thyroid Problems, Tuberculosis. Social History Never smoker, Marital Status - Widowed, Alcohol Use - Never, Drug Use - No History, Caffeine Use - Moderate. Medical History Eyes Patient has history of Cataracts Cardiovascular Patient has history of Arrhythmia - bundle branch block, Hypertension, Peripheral Venous Disease Musculoskeletal Patient has history of Gout, Osteoarthritis Hospitalization/Surgery History - Salpingoophorectomy. - Colon resection. - Appendectomy. - Cystoscopy w/ retrogrades. - Joint replacement. - Mastectomy right. - gastric ulcer repair. - Abdominal hysterectomy. Medical A Surgical History  Notes nd Hematologic/Lymphatic thrombocytopenia Gastrointestinal diverticulosis, GERD Endocrine hypothyroidism Genitourinary CKD stage III Musculoskeletal DJD, osteoporosis Oncologic breast cancer Objective Constitutional Hypertensive, asymptomatic. no acute distress. Vitals Time Taken: 2:15 AM, Height: 60 in, Weight: 138 lbs, BMI: 26.9, Temperature: 98.2 F, Pulse: 74 bpm, Respiratory Rate: 18 breaths/min, Blood Pressure: 199/74 mmHg. Respiratory Normal work of breathing on room air. General Notes: 03/17/2023: The wound is slightly smaller again today. There is minimal slough accumulation but the underlying surface remains quite fibrotic. Edema control is good. Integumentary (Hair, Skin) Wound #1 status is Open. Original cause of wound was Gradually Appeared. The date acquired was: 08/29/2022.  The wound has been in treatment 15 weeks. The wound is located on the Left,Medial Lower Leg. The wound measures 1.4cm length x 0.6cm width x 0.1cm depth; 0.66cm^2 area and 0.066cm^3 volume. There is Fat Layer (Subcutaneous Tissue) exposed. There is no tunneling or undermining noted. There is a medium amount of serosanguineous drainage noted. The wound margin is distinct with the outline attached to the wound base. There is small (1-33%) red granulation within the wound bed. There is a large (67-100%) amount of necrotic tissue within the wound bed including Adherent Slough. The periwound skin appearance had no abnormalities noted for texture. The periwound skin appearance had no abnormalities noted for moisture. The periwound skin appearance had no abnormalities noted for color. Periwound temperature was noted as No Abnormality. The periwound has tenderness on palpation. Assessment Active Problems ICD-10 Erin [name], Erin [name] ([id number]) [phone].pdf Page 7 of 9 Non-pressure chronic ulcer of other part of left lower leg with fat layer exposed Chronic venous hypertension  (idiopathic) with ulcer and inflammation of left lower extremity Essential (primary) hypertension Procedures Wound #1 Pre-procedure diagnosis of Wound #1 is a Venous Leg Ulcer located on the Left,Medial Lower Leg .Severity of Tissue Pre Debridement is: Fat layer exposed. There was a Excisional Skin/Subcutaneous Tissue Debridement with a total area of 0.66 sq cm performed by Duanne Guess, MD. With the following instrument(s): Curette to remove Viable and Non-Viable tissue/material. Material removed includes Eschar, Subcutaneous Tissue, and Slough after achieving pain control using Lidocaine 4% Topical Solution. No specimens were taken. A time out was conducted at 14:40, prior to the start of the procedure. A Minimum amount of bleeding was controlled with Pressure. The procedure was tolerated well with a pain level of 3 throughout and a pain level of 1 following the procedure. Post Debridement Measurements: 1.4cm length x 0.6cm width x 0.1cm depth; 0.066cm^3 volume. Character of Wound/Ulcer Post Debridement is improved. Severity of Tissue Post Debridement is: Fat layer exposed. Post procedure Diagnosis Wound #1: Same as Pre-Procedure Plan Follow-up Appointments: Return Appointment in 1 week. - Dr. Lady Gary - room 1 Tuesday 9/17 @ 2:00 pm Anesthetic: (In clinic) Topical Lidocaine 4% applied to wound bed Bathing/ Shower/ Hygiene: May shower with protection but do not get wound dressing(s) wet. Protect dressing(s) with water repellant cover (for example, large plastic bag) or a cast cover and may then take shower. Edema Control - Lymphedema / SCD / Other: Elevate legs to the level of the heart or above for 30 minutes daily and/or when sitting for 3-4 times a day throughout the day. Avoid standing for long periods of time. Exercise regularly WOUND #1: - Lower Leg Wound Laterality: Left, Medial Cleanser: Soap and Water 1 x Per Week/30 Days Discharge Instructions: May shower and wash wound with  dial antibacterial soap and water prior to dressing change. Cleanser: Wound Cleanser 1 x Per Week/30 Days Discharge Instructions: Cleanse the wound with wound cleanser prior to applying a clean dressing using gauze sponges, not tissue or cotton balls. Peri-Wound Care: Triamcinolone 15 (g) 1 x Per Week/30 Days Discharge Instructions: Use triamcinolone 15 (g) as directed Peri-Wound Care: Sween Lotion (Moisturizing lotion) 1 x Per Week/30 Days Discharge Instructions: Apply moisturizing lotion as directed Topical: Gentamicin 1 x Per Week/30 Days Discharge Instructions: As directed by physician Topical: Mupirocin Ointment 1 x Per Week/30 Days Discharge Instructions: Apply Mupirocin (Bactroban) as instructed Prim Dressing: PolyMem Non-Adhesive Dressing, 4x4 in 1 x Per Week/30 Days ary Discharge Instructions: Apply to wound bed as instructed Secured With:  Elastic Bandage 4 inch (ACE bandage) 1 x Per Week/30 Days Discharge Instructions: Secure with ACE bandage as directed. Secured With: [employer], 4.5x3.1 (in/yd) 1 x Per Week/30 Days Discharge Instructions: Secure with Kerlix as directed. 03/17/2023: The wound is slightly smaller again today. There is minimal slough accumulation but the underlying surface remains quite fibrotic. Edema control is good. I used a curette to debride slough and subcutaneous tissue. I am hopeful that the deeper debridement today will encourage the healing cascade to kick in. In addition, we are going to change her contact layer to PolyMem to see if this helps at all. We will continue the topical gentamicin and mupirocin. Continue additional hydrogel with Optifoam covering the wound to maintain moisture. Continue Kerlix and Ace compression. Follow-up in 1 week. Electronic Signature(s) Signed: 03/17/2023 3:16:53 PM By: Duanne Guess MD FACS Entered By: Duanne Guess on 03/17/2023 12:16:53 HxROS  Details -------------------------------------------------------------------------------- Erin [name] ([id number]) [id number] Page 8 of 9 Patient Name: Date of Service: [name], [name] 03/17/2023 2:00 PM Medical Record Number: [id number] Patient Account Number: [id number] Date of Birth/Sex: Treating RN: [birth year] (82 y.o. Erin [name] Primary Care Provider: [name] Other Clinician: Referring Provider: Treating Provider/Extender: [name] Weeks in Treatment: 15 Eyes Medical History: Positive for: Cataracts Hematologic/Lymphatic Medical History: Past Medical History Notes: thrombocytopenia Cardiovascular Medical History: Positive for: Arrhythmia - bundle branch block; Hypertension; Peripheral Venous Disease Gastrointestinal Medical History: Past Medical History Notes: diverticulosis, GERD Endocrine Medical History: Past Medical History Notes: hypothyroidism Genitourinary Medical History: Past Medical History Notes: CKD stage III Musculoskeletal Medical History: Positive for: Gout; Osteoarthritis Past Medical History Notes: DJD, osteoporosis Oncologic Medical History: Past Medical History Notes: breast cancer HBO Extended History Items Eyes: Cataracts Immunizations Pneumococcal Vaccine: Received Pneumococcal Vaccination: Yes Received Pneumococcal Vaccination On or After 60th Birthday: No Implantable Devices No devices added Hospitalization / Surgery History Type of Hospitalization/Surgery Salpingoophorectomy Colon resection Appendectomy Cystoscopy w/ retrogrades Joint replacement Mastectomy right gastric ulcer repair Abdominal hysterectomy Erin [name], Erin [name] ([id number]) [phone].pdf Page 9 of 9 Family and Social History Cancer: Yes - Siblings; Diabetes: No; Heart Disease: No; Hereditary Spherocytosis: No; Hypertension: No; Kidney Disease: No; Lung  Disease: Yes - Siblings; Seizures: No; Stroke: No; Thyroid Problems: No; Tuberculosis: No; Never smoker; Marital Status - Widowed; Alcohol Use: Never; Drug Use: No History; Caffeine Use: Moderate; Financial Concerns: No; Food, Clothing or Shelter Needs: No; Support System Lacking: No; Transportation Concerns: No Psychologist, prison and probation services) Signed: 03/17/2023 3:58:21 PM By: Duanne Guess MD FACS Signed: 03/17/2023 4:43:06 PM By: Zenaida Deed RN, BSN Entered By: Duanne Guess on 03/17/2023 12:08:29 -------------------------------------------------------------------------------- SuperBill Details Patient Name: Date of Service: [name], [name] 03/17/2023 Medical Record Number: [id number] Patient Account Number: [id number] Date of Birth/Sex: Treating RN: [birth year] (82 y.o. Erin [name] Primary Care Provider: [name] Other Clinician: Referring Provider: Treating Provider/Extender: [name] Weeks in Treatment: 15 Diagnosis Coding ICD-10 Codes Code Description [phone] Non-pressure chronic ulcer of other part of left lower leg with fat layer exposed I87.332 Chronic venous hypertension (idiopathic) with ulcer and inflammation of left lower extremity I10 Essential (primary) hypertension Facility Procedures : CPT4 Code: [id number] 1 Description: 1042 - DEB SUBQ TISSUE 20 SQ CM/< ICD-10 Diagnosis Description L97.822 Non-pressure chronic ulcer of other part of left lower leg with fat layer expo Modifier: sed Quantity: 1 Physician Procedures : CPT4 Code Description Modifier [id number] 99214 - WC PHYS LEVEL 4 - EST  PT 25 ICD-10 Diagnosis Description L97.822 Non-pressure chronic ulcer of other part of left lower leg with fat layer exposed I87.332 Chronic venous hypertension (idiopathic) with  ulcer and inflammation of left lower extremity I10 Essential (primary) hypertension Quantity: 1 : [id number] 11042 - WC PHYS SUBQ TISS 20 SQ CM ICD-10 Diagnosis  Description L97.822 Non-pressure chronic ulcer of other part of left lower leg with fat layer exposed Quantity: 1 Electronic Signature(s) Signed: 03/17/2023 3:17:13 PM By: Duanne Guess MD FACS Entered By: Duanne Guess on 03/17/2023 12:17:13

## 2023-03-24 ENCOUNTER — Encounter (HOSPITAL_BASED_OUTPATIENT_CLINIC_OR_DEPARTMENT_OTHER): Payer: Medicare HMO | Admitting: General Surgery

## 2023-03-24 DIAGNOSIS — I129 Hypertensive chronic kidney disease with stage 1 through stage 4 chronic kidney disease, or unspecified chronic kidney disease: Secondary | ICD-10-CM | POA: Diagnosis not present

## 2023-03-24 DIAGNOSIS — L97822 Non-pressure chronic ulcer of other part of left lower leg with fat layer exposed: Secondary | ICD-10-CM | POA: Diagnosis not present

## 2023-03-24 DIAGNOSIS — I87332 Chronic venous hypertension (idiopathic) with ulcer and inflammation of left lower extremity: Secondary | ICD-10-CM | POA: Diagnosis not present

## 2023-03-24 DIAGNOSIS — I872 Venous insufficiency (chronic) (peripheral): Secondary | ICD-10-CM | POA: Diagnosis not present

## 2023-03-24 DIAGNOSIS — N183 Chronic kidney disease, stage 3 unspecified: Secondary | ICD-10-CM | POA: Diagnosis not present

## 2023-03-24 NOTE — Progress Notes (Signed)
Disease, Gout, Osteoarthritis 08/29/2022 N/A N/A Date Acquired: 1 N/A N/A Weeks of Treatment: Open N/A N/A Wound Status: No N/A N/A Wound Recurrence: 1.7x0.6x0.1 N/A N/A Measurements L x W x D (cm) 0.801 N/A N/A A (cm) : rea 0.08 N/A N/A Volume (cm) : 86.40% N/A N/A % Reduction in A rea: 86.40% N/A N/A % Reduction in Volume: Full Thickness Without Exposed N/A N/A Classification: Support Structures Medium N/A N/A Exudate A mount: Serosanguineous N/A N/A Exudate Type: red, brown N/A N/A Exudate Color: Distinct, outline attached N/A N/A Wound Margin: Small (1-33%) N/A N/A Granulation A mount: Red N/A N/A Granulation Quality: Large (67-100%) N/A N/A Necrotic A mount: Fat Layer (Subcutaneous Tissue): Yes N/A N/A Exposed Structures: Fascia: No Tendon: No Muscle: No Joint: No Bone: No Small (1-33%) N/A N/A Epithelialization: Debridement - Selective/Open Wound N/A N/A Debridement: Pre-procedure Verification/Time Out 14:35 N/A N/A Taken: Lidocaine 4% Topical Solution N/A N/A Pain Control: Slough N/A N/A Tissue Debrided: Non-Viable Tissue N/A N/A Level: 0.8 N/A N/A Debridement A (sq cm): rea Curette N/A  N/A Instrument: Minimum N/A N/A Bleeding: Pressure N/A N/A Hemostasis A chieved: 0 N/A N/A Procedural Pain: 0 N/A N/A Post Procedural Pain: Procedure was tolerated well N/A N/A Debridement Treatment Response: 1.7x0.6x0.1 N/A N/A Post Debridement Measurements L x W x D (cm) 0.08 N/A N/A Post Debridement Volume: (cm) No Abnormalities Noted N/A N/A Periwound Skin Texture: No Abnormalities Noted N/A N/A Periwound Skin Moisture: No Abnormalities Noted N/A N/A Periwound Skin Color: No Abnormality N/A N/A Temperature: Yes N/A N/A Tenderness on Palpation: Debridement N/A N/A Procedures Performed: Treatment Notes Electronic Signature(s) Signed: 03/24/2023 2:46:16 PM By: Duanne Guess MD FACS Signed: 03/24/2023 4:41:09 PM By: Zenaida Deed RN, BSN Entered By: Duanne Guess on 03/24/2023 11:46:16 -------------------------------------------------------------------------------- Multi-Disciplinary Care Plan Details Patient Name: Date of Service: [name], [name] 03/24/2023 2:00 PM Medical Record Number: [id number] Patient Account Number: [id number] Date of Birth/Sex: Treating RN: 1940/12/23 (82 y.o. Erin [name] Primary Care Peggie Hornak: [name] Other Clinician: Referring Sparkles Mcneely: Treating Caddie Randle/Extender: [name], Frederick Peers in Treatment: [address], [place] ([id number]) [id number] Page 4 of 7 Multidisciplinary Care Plan reviewed with physician Active Inactive Necrotic Tissue Nursing Diagnoses: Impaired tissue integrity related to necrotic/devitalized tissue Knowledge deficit related to management of necrotic/devitalized tissue Goals: Necrotic/devitalized tissue will be minimized in the wound bed Date Initiated: 11/26/2022 Target Resolution Date: 05/06/2023 Goal Status: Active Patient/caregiver will verbalize understanding of reason and process for debridement of necrotic tissue Date Initiated:  11/26/2022 Date Inactivated: 01/20/2023 Target Resolution Date: 03/06/2023 Goal Status: Met Interventions: Assess patient pain level pre-, during and post procedure and prior to discharge Provide education on necrotic tissue and debridement process Treatment Activities: Apply topical anesthetic as ordered : 11/26/2022 Notes: Wound/Skin Impairment Nursing Diagnoses: Impaired tissue integrity Knowledge deficit related to ulceration/compromised skin integrity Goals: Patient/caregiver will verbalize understanding of skin care regimen Date Initiated: 11/26/2022 Target Resolution Date: 05/06/2023 Goal Status: Active Interventions: Assess ulceration(s) every visit Treatment Activities: Skin care regimen initiated : 11/26/2022 Topical wound management initiated : 11/26/2022 Notes: Electronic Signature(s) Signed: 03/24/2023 4:41:09 PM By: Zenaida Deed RN, BSN Entered By: Zenaida Deed on 03/24/2023 11:33:38 -------------------------------------------------------------------------------- Pain Assessment Details Patient Name: Date of Service: [name], [name] 03/24/2023 2:00 PM Medical Record Number: [id number] Patient Account Number: [id number] Date of Birth/Sex: Treating RN: [birth year] (82 y.o. F) Primary Care Carol Loftin: [name] Other Clinician: Referring Samiksha Pellicano: Treating Kalmen Lollar/Extender: [name], [name]  BRIGID, CHASE ([id number]) [id number] Page 1 of 7 Visit Report for 03/24/2023 Arrival Information Details Patient Name: Date of Service: [name], [name] RBA RA N. 03/24/2023 2:00 PM Medical Record Number: [id number] Patient Account Number: [id number] Date of Birth/Sex: Treating RN: [birth year] (82 y.o. F) Primary Care Nicole Defino: [name] Other Clinician: Referring Steen Bisig: Treating Aanyah Loa/Extender: [name] Weeks in Treatment: 16 Visit Information History Since Last Visit All ordered tests and consults were completed: No Patient Arrived: Ambulatory Added or deleted any medications: No Arrival Time: 14:10 Any new allergies or adverse reactions: No Accompanied By: daughter Had a fall or experienced change in No Transfer Assistance: None activities of daily living that may affect Patient Identification Verified: Yes risk of falls: Secondary Verification Process Completed: Yes Signs or symptoms of abuse/neglect since last visito No Patient Requires Transmission-Based Precautions: No Hospitalized since last visit: No Patient Has Alerts: No Implantable device outside of the clinic excluding No cellular tissue based products placed in the center since last visit: Has Dressing in Place as Prescribed: Yes Has Compression in Place as Prescribed: Yes Pain Present Now: No Electronic Signature(s) Signed: 03/24/2023 4:41:09 PM By: Zenaida Deed RN, BSN Entered By: Zenaida Deed on 03/24/2023 11:26:19 -------------------------------------------------------------------------------- Encounter Discharge Information Details Patient Name: Date of Service: [name], [name] 03/24/2023 2:00 PM Medical Record Number: [id number] Patient Account Number: [id number] Date of Birth/Sex: Treating RN: 05/06/1941 (82 y.o. Erin [name] Primary Care Jaramie Bastos: [name] Other Clinician: Referring Lugenia Assefa: Treating  Jacobie Stamey/Extender: [name] Weeks in Treatment: 60 Encounter Discharge Information Items Post Procedure Vitals Discharge Condition: Stable Temperature (F): 98.2 Ambulatory Status: Ambulatory Pulse (bpm): 62 Discharge Destination: Home Respiratory Rate (breaths/min): 18 Transportation: Private Auto Blood Pressure (mmHg): 188/80 Accompanied By: daughter Schedule Follow-up Appointment: Yes Clinical Summary of Care: Patient Declined Electronic Signature(s) Signed: 03/24/2023 4:41:09 PM By: Zenaida Deed RN, BSN Entered By: Zenaida Deed on 03/24/2023 11:50:53 [name] ([id number]) [id number].pdf Page 2 of 7 -------------------------------------------------------------------------------- Lower Extremity Assessment Details Patient Name: Date of Service: [name], [name] 03/24/2023 2:00 PM Medical Record Number: [id number] Patient Account Number: [id number] Date of Birth/Sex: Treating RN: Aug 10, 1940 (82 y.o. Erin [name] Primary Care Pavan Bring: [name] Other Clinician: Referring Fayette Gasner: Treating Hamlet Lasecki/Extender: [name], [name] in Treatment: 16 Edema Assessment Assessed: [Left: No] [Right: No] Edema: [Left: Ye] [Right: s] Calf Left: Right: Point of Measurement: From Medial Instep 26.5 cm Ankle Left: Right: Point of Measurement: From Medial Instep 18 cm Vascular Assessment Pulses: Dorsalis Pedis Palpable: [Left:Yes] Extremity colors, hair growth, and conditions: Extremity Color: [Left:Hyperpigmented] Hair Growth on Extremity: [Left:No] Temperature of Extremity: [Left:Warm < 3 seconds] Electronic Signature(s) Signed: 03/24/2023 4:41:09 PM By: Zenaida Deed RN, BSN Entered By: Zenaida Deed on 03/24/2023 11:27:26 -------------------------------------------------------------------------------- Multi Wound Chart Details Patient Name: Date of Service: [name], BA  RBA RA N. 03/24/2023 2:00 PM Medical Record Number: [id number] Patient Account Number: [id number] Date of Birth/Sex: Treating RN: Oct 22, 1940 (82 y.o. Erin [name] Primary Care Alysandra Lobue: [name] Other Clinician: Referring Jacobo Moncrief: Treating Mikhai Bienvenue/Extender: [name], [name] in Treatment: 16 Vital Signs Height(in): 60 Pulse(bpm): 62 Weight(lbs): 138 Blood Pressure(mmHg): 189/80 Body Mass Index(BMI): 26.9 Temperature(F): 98.2 Respiratory Rate(breaths/min): 18 [1:Photos:] [N/A:N/A] Left, Medial Lower Leg N/A N/A Wound Location: Gradually Appeared N/A N/A Wounding Event: Venous Leg Ulcer N/A N/A Primary Etiology: Cataracts, Arrhythmia, Hypertension, N/A N/A Comorbid History: Peripheral Venous  Disease, Gout, Osteoarthritis 08/29/2022 N/A N/A Date Acquired: 1 N/A N/A Weeks of Treatment: Open N/A N/A Wound Status: No N/A N/A Wound Recurrence: 1.7x0.6x0.1 N/A N/A Measurements L x W x D (cm) 0.801 N/A N/A A (cm) : rea 0.08 N/A N/A Volume (cm) : 86.40% N/A N/A % Reduction in A rea: 86.40% N/A N/A % Reduction in Volume: Full Thickness Without Exposed N/A N/A Classification: Support Structures Medium N/A N/A Exudate A mount: Serosanguineous N/A N/A Exudate Type: red, brown N/A N/A Exudate Color: Distinct, outline attached N/A N/A Wound Margin: Small (1-33%) N/A N/A Granulation A mount: Red N/A N/A Granulation Quality: Large (67-100%) N/A N/A Necrotic A mount: Fat Layer (Subcutaneous Tissue): Yes N/A N/A Exposed Structures: Fascia: No Tendon: No Muscle: No Joint: No Bone: No Small (1-33%) N/A N/A Epithelialization: Debridement - Selective/Open Wound N/A N/A Debridement: Pre-procedure Verification/Time Out 14:35 N/A N/A Taken: Lidocaine 4% Topical Solution N/A N/A Pain Control: Slough N/A N/A Tissue Debrided: Non-Viable Tissue N/A N/A Level: 0.8 N/A N/A Debridement A (sq cm): rea Curette N/A  N/A Instrument: Minimum N/A N/A Bleeding: Pressure N/A N/A Hemostasis A chieved: 0 N/A N/A Procedural Pain: 0 N/A N/A Post Procedural Pain: Procedure was tolerated well N/A N/A Debridement Treatment Response: 1.7x0.6x0.1 N/A N/A Post Debridement Measurements L x W x D (cm) 0.08 N/A N/A Post Debridement Volume: (cm) No Abnormalities Noted N/A N/A Periwound Skin Texture: No Abnormalities Noted N/A N/A Periwound Skin Moisture: No Abnormalities Noted N/A N/A Periwound Skin Color: No Abnormality N/A N/A Temperature: Yes N/A N/A Tenderness on Palpation: Debridement N/A N/A Procedures Performed: Treatment Notes Electronic Signature(s) Signed: 03/24/2023 2:46:16 PM By: Duanne Guess MD FACS Signed: 03/24/2023 4:41:09 PM By: Zenaida Deed RN, BSN Entered By: Duanne Guess on 03/24/2023 11:46:16 -------------------------------------------------------------------------------- Multi-Disciplinary Care Plan Details Patient Name: Date of Service: [name], [name] 03/24/2023 2:00 PM Medical Record Number: [id number] Patient Account Number: [id number] Date of Birth/Sex: Treating RN: 1940/12/23 (82 y.o. Erin [name] Primary Care Peggie Hornak: [name] Other Clinician: Referring Sparkles Mcneely: Treating Caddie Randle/Extender: [name], Frederick Peers in Treatment: [address], [place] ([id number]) [id number] Page 4 of 7 Multidisciplinary Care Plan reviewed with physician Active Inactive Necrotic Tissue Nursing Diagnoses: Impaired tissue integrity related to necrotic/devitalized tissue Knowledge deficit related to management of necrotic/devitalized tissue Goals: Necrotic/devitalized tissue will be minimized in the wound bed Date Initiated: 11/26/2022 Target Resolution Date: 05/06/2023 Goal Status: Active Patient/caregiver will verbalize understanding of reason and process for debridement of necrotic tissue Date Initiated:  11/26/2022 Date Inactivated: 01/20/2023 Target Resolution Date: 03/06/2023 Goal Status: Met Interventions: Assess patient pain level pre-, during and post procedure and prior to discharge Provide education on necrotic tissue and debridement process Treatment Activities: Apply topical anesthetic as ordered : 11/26/2022 Notes: Wound/Skin Impairment Nursing Diagnoses: Impaired tissue integrity Knowledge deficit related to ulceration/compromised skin integrity Goals: Patient/caregiver will verbalize understanding of skin care regimen Date Initiated: 11/26/2022 Target Resolution Date: 05/06/2023 Goal Status: Active Interventions: Assess ulceration(s) every visit Treatment Activities: Skin care regimen initiated : 11/26/2022 Topical wound management initiated : 11/26/2022 Notes: Electronic Signature(s) Signed: 03/24/2023 4:41:09 PM By: Zenaida Deed RN, BSN Entered By: Zenaida Deed on 03/24/2023 11:33:38 -------------------------------------------------------------------------------- Pain Assessment Details Patient Name: Date of Service: [name], [name] 03/24/2023 2:00 PM Medical Record Number: [id number] Patient Account Number: [id number] Date of Birth/Sex: Treating RN: [birth year] (82 y.o. F) Primary Care Carol Loftin: [name] Other Clinician: Referring Samiksha Pellicano: Treating Kalmen Lollar/Extender: [name], [name]  Disease, Gout, Osteoarthritis 08/29/2022 N/A N/A Date Acquired: 1 N/A N/A Weeks of Treatment: Open N/A N/A Wound Status: No N/A N/A Wound Recurrence: 1.7x0.6x0.1 N/A N/A Measurements L x W x D (cm) 0.801 N/A N/A A (cm) : rea 0.08 N/A N/A Volume (cm) : 86.40% N/A N/A % Reduction in A rea: 86.40% N/A N/A % Reduction in Volume: Full Thickness Without Exposed N/A N/A Classification: Support Structures Medium N/A N/A Exudate A mount: Serosanguineous N/A N/A Exudate Type: red, brown N/A N/A Exudate Color: Distinct, outline attached N/A N/A Wound Margin: Small (1-33%) N/A N/A Granulation A mount: Red N/A N/A Granulation Quality: Large (67-100%) N/A N/A Necrotic A mount: Fat Layer (Subcutaneous Tissue): Yes N/A N/A Exposed Structures: Fascia: No Tendon: No Muscle: No Joint: No Bone: No Small (1-33%) N/A N/A Epithelialization: Debridement - Selective/Open Wound N/A N/A Debridement: Pre-procedure Verification/Time Out 14:35 N/A N/A Taken: Lidocaine 4% Topical Solution N/A N/A Pain Control: Slough N/A N/A Tissue Debrided: Non-Viable Tissue N/A N/A Level: 0.8 N/A N/A Debridement A (sq cm): rea Curette N/A  N/A Instrument: Minimum N/A N/A Bleeding: Pressure N/A N/A Hemostasis A chieved: 0 N/A N/A Procedural Pain: 0 N/A N/A Post Procedural Pain: Procedure was tolerated well N/A N/A Debridement Treatment Response: 1.7x0.6x0.1 N/A N/A Post Debridement Measurements L x W x D (cm) 0.08 N/A N/A Post Debridement Volume: (cm) No Abnormalities Noted N/A N/A Periwound Skin Texture: No Abnormalities Noted N/A N/A Periwound Skin Moisture: No Abnormalities Noted N/A N/A Periwound Skin Color: No Abnormality N/A N/A Temperature: Yes N/A N/A Tenderness on Palpation: Debridement N/A N/A Procedures Performed: Treatment Notes Electronic Signature(s) Signed: 03/24/2023 2:46:16 PM By: Duanne Guess MD FACS Signed: 03/24/2023 4:41:09 PM By: Zenaida Deed RN, BSN Entered By: Duanne Guess on 03/24/2023 11:46:16 -------------------------------------------------------------------------------- Multi-Disciplinary Care Plan Details Patient Name: Date of Service: [name], [name] 03/24/2023 2:00 PM Medical Record Number: [id number] Patient Account Number: [id number] Date of Birth/Sex: Treating RN: 1940/12/23 (82 y.o. Erin [name] Primary Care Peggie Hornak: [name] Other Clinician: Referring Sparkles Mcneely: Treating Caddie Randle/Extender: [name], Frederick Peers in Treatment: [address], [place] ([id number]) [id number] Page 4 of 7 Multidisciplinary Care Plan reviewed with physician Active Inactive Necrotic Tissue Nursing Diagnoses: Impaired tissue integrity related to necrotic/devitalized tissue Knowledge deficit related to management of necrotic/devitalized tissue Goals: Necrotic/devitalized tissue will be minimized in the wound bed Date Initiated: 11/26/2022 Target Resolution Date: 05/06/2023 Goal Status: Active Patient/caregiver will verbalize understanding of reason and process for debridement of necrotic tissue Date Initiated:  11/26/2022 Date Inactivated: 01/20/2023 Target Resolution Date: 03/06/2023 Goal Status: Met Interventions: Assess patient pain level pre-, during and post procedure and prior to discharge Provide education on necrotic tissue and debridement process Treatment Activities: Apply topical anesthetic as ordered : 11/26/2022 Notes: Wound/Skin Impairment Nursing Diagnoses: Impaired tissue integrity Knowledge deficit related to ulceration/compromised skin integrity Goals: Patient/caregiver will verbalize understanding of skin care regimen Date Initiated: 11/26/2022 Target Resolution Date: 05/06/2023 Goal Status: Active Interventions: Assess ulceration(s) every visit Treatment Activities: Skin care regimen initiated : 11/26/2022 Topical wound management initiated : 11/26/2022 Notes: Electronic Signature(s) Signed: 03/24/2023 4:41:09 PM By: Zenaida Deed RN, BSN Entered By: Zenaida Deed on 03/24/2023 11:33:38 -------------------------------------------------------------------------------- Pain Assessment Details Patient Name: Date of Service: [name], [name] 03/24/2023 2:00 PM Medical Record Number: [id number] Patient Account Number: [id number] Date of Birth/Sex: Treating RN: [birth year] (82 y.o. F) Primary Care Carol Loftin: [name] Other Clinician: Referring Samiksha Pellicano: Treating Kalmen Lollar/Extender: [name], [name]

## 2023-03-24 NOTE — Progress Notes (Signed)
Week/30 Days Discharge Instructions: As directed by physician Topical: Mupirocin Ointment 1 x Per Week/30 Days Discharge Instructions: Apply Mupirocin (Bactroban) as instructed Prim Dressing: PolyMem Non-Adhesive Dressing, 4x4 in 1 x Per Week/30 Days ary Discharge Instructions: Apply to wound bed as instructed Secured With: Elastic Bandage 4 inch (ACE bandage) 1 x Per Week/30 Days Discharge Instructions: Secure with ACE bandage as directed. Secured With: [employer], 4.5x3.1 (in/yd) 1 x Per Week/30 Days Discharge Instructions: Secure with Kerlix as directed. 03/24/2023: Although the wound measurements are the same today, visually it looks smaller. There is still a fairly fibrotic surface underneath and slough. Edema remains well-controlled. I used a curette to debride the slough off of the wound surface. We will continue the topical gentamicin and mupirocin with PolyMem Ag and Kerlix and Ace bandage compression. Follow-up in 1 week. Electronic Signature(s) Signed: 03/24/2023 2:50:40 PM By: Duanne Guess MD FACS Entered By: Duanne Guess on 03/24/2023 11:50:40 Erin [name] ([id number]) [phone].pdf Page 8 of 9 -------------------------------------------------------------------------------- HxROS Details Patient Name: Date of Service: [name], [name] 03/24/2023 2:00 PM Medical Record Number: [id number] Patient Account Number: [id number] Date of Birth/Sex: Treating RN: [birth year] (82 y.o. Erin [name] Primary Care Provider: [name] Other Clinician: Referring Provider: Treating Provider/Extender: [name] Weeks in Treatment: 16 Eyes Medical History: Positive for: Cataracts Hematologic/Lymphatic Medical History: Past Medical History Notes: thrombocytopenia Cardiovascular Medical History: Positive for: Arrhythmia - bundle branch block; Hypertension; Peripheral Venous Disease Gastrointestinal Medical History: Past Medical History Notes: diverticulosis, GERD Endocrine Medical History: Past Medical History Notes: hypothyroidism Genitourinary Medical History: Past Medical History Notes: CKD stage III Musculoskeletal Medical History: Positive for: Gout; Osteoarthritis Past Medical History Notes: DJD, osteoporosis Oncologic Medical History: Past Medical History Notes: breast cancer HBO Extended History Items Eyes: Cataracts Immunizations Pneumococcal Vaccine: Received Pneumococcal Vaccination: Yes Received Pneumococcal Vaccination On or After 60th Birthday: No Implantable Devices No devices added Hospitalization / Surgery History Type of Hospitalization/Surgery Erin [name] ([id number]) [phone].pdf Page 9 of 9 Salpingoophorectomy Colon resection Appendectomy Cystoscopy w/ retrogrades Joint replacement Mastectomy right gastric ulcer repair Abdominal hysterectomy Family and Social History Cancer: Yes - Siblings; Diabetes: No; Heart Disease: No; Hereditary Spherocytosis: No;  Hypertension: No; Kidney Disease: No; Lung Disease: Yes - Siblings; Seizures: No; Stroke: No; Thyroid Problems: No; Tuberculosis: No; Never smoker; Marital Status - Widowed; Alcohol Use: Never; Drug Use: No History; Caffeine Use: Moderate; Financial Concerns: No; Food, Clothing or Shelter Needs: No; Support System Lacking: No; Transportation Concerns: No Psychologist, prison and probation services) Signed: 03/24/2023 2:51:21 PM By: Duanne Guess MD FACS Signed: 03/24/2023 4:41:09 PM By: Zenaida Deed RN, BSN Entered By: Duanne Guess on 03/24/2023 11:48:56 -------------------------------------------------------------------------------- SuperBill Details Patient Name: Date of Service: [name], [name] 03/24/2023 Medical Record Number: [id number] Patient Account Number: [id number] Date of Birth/Sex: Treating RN: [birth year] (82 y.o. Erin [name] Primary Care Provider: [name] Other Clinician: Referring Provider: Treating Provider/Extender: [name] Weeks in Treatment: 16 Diagnosis Coding ICD-10 Codes Code Description [id number] Non-pressure chronic ulcer of other part of left lower leg with fat layer exposed I87.332 Chronic venous hypertension (idiopathic) with ulcer and inflammation of left lower extremity I10 Essential (primary) hypertension Facility Procedures : CPT4 Code: 10626948 Description: 97597 - DEBRIDE WOUND 1ST 20 SQ CM OR < ICD-10 Diagnosis Description L97.822 Non-pressure chronic ulcer of other part of left lower leg with fat layer exposed Modifier: Quantity: 1 Physician Procedures : CPT4 Code Description Modifier  Erin [name], Erin [name] ([id number]) [id number] Page 1 of 9 Visit Report for 03/24/2023 Chief Complaint Document Details Patient Name: Date of Service: [name], [name] RBA RA N. 03/24/2023 2:00 PM Medical Record Number: [id number] Patient Account Number: [id number] Date of Birth/Sex: Treating RN: 12-07-1940 (82 y.o. Erin [name] Primary Care Provider: [name] Other Clinician: Referring Provider: Treating Provider/Extender: [name] Weeks in Treatment: 16 Information Obtained from: Patient Chief Complaint Patient presents for treatment of an open ulcer due to venous insufficiency Electronic Signature(s) Signed: 03/24/2023 2:46:24 PM By: Duanne Guess MD FACS Entered By: Duanne Guess on 03/24/2023 11:46:23 -------------------------------------------------------------------------------- Debridement Details Patient Name: Date of Service: [name], [name] 03/24/2023 2:00 PM Medical Record Number: [id number] Patient Account Number: [id number] Date of Birth/Sex: Treating RN: 29-May-1941 (82 y.o. Erin [name] Primary Care Provider: [name] Other Clinician: Referring Provider: Treating Provider/Extender: [name] Weeks in Treatment: 16 Debridement Performed for Assessment: Wound #1 Left,Medial Lower Leg Performed By: Physician Duanne Guess, MD The following information was scribed by: Zenaida Deed The information was scribed for: Duanne Guess Debridement Type: Debridement Severity of Tissue Pre Debridement: Fat layer exposed Level of Consciousness (Pre-procedure): Awake and Alert Pre-procedure Verification/Time Out Yes - 14:35 Taken: Pain Control: Lidocaine 4% T opical Solution Percent of Wound Bed Debrided: 100% T Area Debrided (cm): otal 0.8 Tissue and other material debrided: Non-Viable, Slough, Slough Level: Non-Viable Tissue Debridement Description:  Selective/Open Wound Instrument: Curette Bleeding: Minimum Hemostasis Achieved: Pressure Procedural Pain: 0 Post Procedural Pain: 0 Response to Treatment: Procedure was tolerated well Level of Consciousness (Post- Awake and Alert procedure): Post Debridement Measurements of Total Wound Length: (cm) 1.7 Width: (cm) 0.6 Depth: (cm) 0.1 Volume: (cm) 0.08 Character of Wound/Ulcer Post Debridement: Improved KEHLANIE, TENSLEY ([id number]) [phone].pdf Page 2 of 9 Severity of Tissue Post Debridement: Fat layer exposed Post Procedure Diagnosis Same as Pre-procedure Electronic Signature(s) Signed: 03/24/2023 2:51:21 PM By: Duanne Guess MD FACS Signed: 03/24/2023 4:41:09 PM By: Zenaida Deed RN, BSN Entered By: Zenaida Deed on 03/24/2023 11:41:16 -------------------------------------------------------------------------------- HPI Details Patient Name: Date of Service: [name], [name] 03/24/2023 2:00 PM Medical Record Number: [id number] Patient Account Number: [id number] Date of Birth/Sex: Treating RN: [birth year] (82 y.o. Erin [name] Primary Care Provider: [name] Other Clinician: Referring Provider: Treating Provider/Extender: [name] Weeks in Treatment: 16 History of Present Illness HPI Description: ADMISSION 11/26/2022 This is an 82 year old woman who was referred by her primary care provider for a persistent nonhealing wound on her left lower extremity. It has been present since February of this year. 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 I do not see any formal venous reflux studies in the electronic medical record. She is not diabetic. She does not smoke. Her PCP has been washing her leg each week and applying a Kerlix and Coban wrap, but has not performed any formal debridement and has not been using  any sort of topical contact layer or ointment. ABI in clinic was noncompressible, but she has a palpable pedal pulse. 12/10/2022: The wound is a bit smaller and cleaner today. There is still fairly heavy slough on the surface. Edema control is acceptable. 12/16/2022: The wound is measuring the slightest bit smaller today. There is still thick slough on the surface. Edema control is good. 12/23/2022: The wound is a little  Erin [name], Erin [name] ([id number]) [id number] Page 1 of 9 Visit Report for 03/24/2023 Chief Complaint Document Details Patient Name: Date of Service: [name], [name] RBA RA N. 03/24/2023 2:00 PM Medical Record Number: [id number] Patient Account Number: [id number] Date of Birth/Sex: Treating RN: 12-07-1940 (82 y.o. Erin [name] Primary Care Provider: [name] Other Clinician: Referring Provider: Treating Provider/Extender: [name] Weeks in Treatment: 16 Information Obtained from: Patient Chief Complaint Patient presents for treatment of an open ulcer due to venous insufficiency Electronic Signature(s) Signed: 03/24/2023 2:46:24 PM By: Duanne Guess MD FACS Entered By: Duanne Guess on 03/24/2023 11:46:23 -------------------------------------------------------------------------------- Debridement Details Patient Name: Date of Service: [name], [name] 03/24/2023 2:00 PM Medical Record Number: [id number] Patient Account Number: [id number] Date of Birth/Sex: Treating RN: 29-May-1941 (82 y.o. Erin [name] Primary Care Provider: [name] Other Clinician: Referring Provider: Treating Provider/Extender: [name] Weeks in Treatment: 16 Debridement Performed for Assessment: Wound #1 Left,Medial Lower Leg Performed By: Physician Duanne Guess, MD The following information was scribed by: Zenaida Deed The information was scribed for: Duanne Guess Debridement Type: Debridement Severity of Tissue Pre Debridement: Fat layer exposed Level of Consciousness (Pre-procedure): Awake and Alert Pre-procedure Verification/Time Out Yes - 14:35 Taken: Pain Control: Lidocaine 4% T opical Solution Percent of Wound Bed Debrided: 100% T Area Debrided (cm): otal 0.8 Tissue and other material debrided: Non-Viable, Slough, Slough Level: Non-Viable Tissue Debridement Description:  Selective/Open Wound Instrument: Curette Bleeding: Minimum Hemostasis Achieved: Pressure Procedural Pain: 0 Post Procedural Pain: 0 Response to Treatment: Procedure was tolerated well Level of Consciousness (Post- Awake and Alert procedure): Post Debridement Measurements of Total Wound Length: (cm) 1.7 Width: (cm) 0.6 Depth: (cm) 0.1 Volume: (cm) 0.08 Character of Wound/Ulcer Post Debridement: Improved KEHLANIE, TENSLEY ([id number]) [phone].pdf Page 2 of 9 Severity of Tissue Post Debridement: Fat layer exposed Post Procedure Diagnosis Same as Pre-procedure Electronic Signature(s) Signed: 03/24/2023 2:51:21 PM By: Duanne Guess MD FACS Signed: 03/24/2023 4:41:09 PM By: Zenaida Deed RN, BSN Entered By: Zenaida Deed on 03/24/2023 11:41:16 -------------------------------------------------------------------------------- HPI Details Patient Name: Date of Service: [name], [name] 03/24/2023 2:00 PM Medical Record Number: [id number] Patient Account Number: [id number] Date of Birth/Sex: Treating RN: [birth year] (82 y.o. Erin [name] Primary Care Provider: [name] Other Clinician: Referring Provider: Treating Provider/Extender: [name] Weeks in Treatment: 16 History of Present Illness HPI Description: ADMISSION 11/26/2022 This is an 82 year old woman who was referred by her primary care provider for a persistent nonhealing wound on her left lower extremity. It has been present since February of this year. 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 I do not see any formal venous reflux studies in the electronic medical record. She is not diabetic. She does not smoke. Her PCP has been washing her leg each week and applying a Kerlix and Coban wrap, but has not performed any formal debridement and has not been using  any sort of topical contact layer or ointment. ABI in clinic was noncompressible, but she has a palpable pedal pulse. 12/10/2022: The wound is a bit smaller and cleaner today. There is still fairly heavy slough on the surface. Edema control is acceptable. 12/16/2022: The wound is measuring the slightest bit smaller today. There is still thick slough on the surface. Edema control is good. 12/23/2022: The wound is a little  Week/30 Days Discharge Instructions: As directed by physician Topical: Mupirocin Ointment 1 x Per Week/30 Days Discharge Instructions: Apply Mupirocin (Bactroban) as instructed Prim Dressing: PolyMem Non-Adhesive Dressing, 4x4 in 1 x Per Week/30 Days ary Discharge Instructions: Apply to wound bed as instructed Secured With: Elastic Bandage 4 inch (ACE bandage) 1 x Per Week/30 Days Discharge Instructions: Secure with ACE bandage as directed. Secured With: [employer], 4.5x3.1 (in/yd) 1 x Per Week/30 Days Discharge Instructions: Secure with Kerlix as directed. 03/24/2023: Although the wound measurements are the same today, visually it looks smaller. There is still a fairly fibrotic surface underneath and slough. Edema remains well-controlled. I used a curette to debride the slough off of the wound surface. We will continue the topical gentamicin and mupirocin with PolyMem Ag and Kerlix and Ace bandage compression. Follow-up in 1 week. Electronic Signature(s) Signed: 03/24/2023 2:50:40 PM By: Duanne Guess MD FACS Entered By: Duanne Guess on 03/24/2023 11:50:40 Erin [name] ([id number]) [phone].pdf Page 8 of 9 -------------------------------------------------------------------------------- HxROS Details Patient Name: Date of Service: [name], [name] 03/24/2023 2:00 PM Medical Record Number: [id number] Patient Account Number: [id number] Date of Birth/Sex: Treating RN: [birth year] (82 y.o. Erin [name] Primary Care Provider: [name] Other Clinician: Referring Provider: Treating Provider/Extender: [name] Weeks in Treatment: 16 Eyes Medical History: Positive for: Cataracts Hematologic/Lymphatic Medical History: Past Medical History Notes: thrombocytopenia Cardiovascular Medical History: Positive for: Arrhythmia - bundle branch block; Hypertension; Peripheral Venous Disease Gastrointestinal Medical History: Past Medical History Notes: diverticulosis, GERD Endocrine Medical History: Past Medical History Notes: hypothyroidism Genitourinary Medical History: Past Medical History Notes: CKD stage III Musculoskeletal Medical History: Positive for: Gout; Osteoarthritis Past Medical History Notes: DJD, osteoporosis Oncologic Medical History: Past Medical History Notes: breast cancer HBO Extended History Items Eyes: Cataracts Immunizations Pneumococcal Vaccine: Received Pneumococcal Vaccination: Yes Received Pneumococcal Vaccination On or After 60th Birthday: No Implantable Devices No devices added Hospitalization / Surgery History Type of Hospitalization/Surgery Erin [name] ([id number]) [phone].pdf Page 9 of 9 Salpingoophorectomy Colon resection Appendectomy Cystoscopy w/ retrogrades Joint replacement Mastectomy right gastric ulcer repair Abdominal hysterectomy Family and Social History Cancer: Yes - Siblings; Diabetes: No; Heart Disease: No; Hereditary Spherocytosis: No;  Hypertension: No; Kidney Disease: No; Lung Disease: Yes - Siblings; Seizures: No; Stroke: No; Thyroid Problems: No; Tuberculosis: No; Never smoker; Marital Status - Widowed; Alcohol Use: Never; Drug Use: No History; Caffeine Use: Moderate; Financial Concerns: No; Food, Clothing or Shelter Needs: No; Support System Lacking: No; Transportation Concerns: No Psychologist, prison and probation services) Signed: 03/24/2023 2:51:21 PM By: Duanne Guess MD FACS Signed: 03/24/2023 4:41:09 PM By: Zenaida Deed RN, BSN Entered By: Duanne Guess on 03/24/2023 11:48:56 -------------------------------------------------------------------------------- SuperBill Details Patient Name: Date of Service: [name], [name] 03/24/2023 Medical Record Number: [id number] Patient Account Number: [id number] Date of Birth/Sex: Treating RN: [birth year] (82 y.o. Erin [name] Primary Care Provider: [name] Other Clinician: Referring Provider: Treating Provider/Extender: [name] Weeks in Treatment: 16 Diagnosis Coding ICD-10 Codes Code Description [id number] Non-pressure chronic ulcer of other part of left lower leg with fat layer exposed I87.332 Chronic venous hypertension (idiopathic) with ulcer and inflammation of left lower extremity I10 Essential (primary) hypertension Facility Procedures : CPT4 Code: [id number] Description: 97597 - DEBRIDE WOUND 1ST 20 SQ CM OR < ICD-10 Diagnosis Description L97.822 Non-pressure chronic ulcer of other part of left lower leg with fat layer exposed Modifier: Quantity: 1 Physician Procedures : CPT4 Code Description Modifier  smaller. There is still a fairly fibrotic surface underneath and slough. Edema remains well-controlled. Electronic Signature(s) Signed: 03/24/2023 2:47:00 PM By: Duanne Guess MD FACS Entered By: Duanne Guess on 03/24/2023 11:47:00 -------------------------------------------------------------------------------- Physical Exam Details Patient Name: Date of Service: [name], [name] 03/24/2023 2:00 PM Medical Record Number: [id number] Patient Account Number: [id number] Date of Birth/Sex: Treating RN: [birth year] (82 y.o. Erin [name] Primary Care Provider: [name] Other Clinician: Referring Provider: Treating Provider/Extender: [name], [name] in Treatment: 16 Constitutional Hypertensive, asymptomatic. . . . no acute distress. Respiratory Normal work of breathing on room air. Notes 03/24/2023: Although the wound measurements are the same today, visually it looks smaller. There is still a fairly fibrotic surface underneath and slough. Edema remains well-controlled. Electronic Signature(s) Signed: 03/24/2023 2:49:20 PM By: Duanne Guess MD FACS Entered By: Duanne Guess on 03/24/2023 11:49:19 -------------------------------------------------------------------------------- Physician Orders Details Patient Name: Date of Service: [name], [name] 03/24/2023 2:00 PM Medical Record Number: [id number] Patient Account Number: [id number] Date of Birth/Sex: Treating RN: 30-May-1941 (82 y.o. 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 - Dr. Lady Gary - room 1 Return A Tuesday 9/24 @ 2:00 pm Anesthetic (In clinic) Topical Lidocaine 4% applied to wound bed Erin [name], Erin [name] ([id number]) [phone].pdf Page 4 of 9 Bathing/ Shower/ Hygiene May shower with protection but do not get wound dressing(s) wet. Protect dressing(s) with water repellant cover (for example, large plastic bag) or a cast cover and may then take shower. Edema Control - Lymphedema / SCD / Other Elevate legs to the level of the heart or above for 30 minutes daily and/or when sitting for 3-4 times a day throughout the day. Avoid standing for long periods of time. Exercise regularly Wound Treatment Wound #1 - Lower Leg Wound Laterality: Left, Medial Cleanser: Soap and Water 1 x Per Week/30 Days Discharge Instructions: May shower and wash wound with dial antibacterial soap and water prior to dressing change. Cleanser: Wound Cleanser 1 x Per Week/30 Days Discharge Instructions: Cleanse the wound with wound cleanser prior to applying a clean dressing using gauze sponges, not tissue or cotton balls. Peri-Wound Care: Triamcinolone 15 (g) 1 x Per Week/30 Days Discharge Instructions: Use triamcinolone 15 (g) as directed Peri-Wound Care: Sween Lotion (Moisturizing lotion) 1 x Per Week/30 Days Discharge Instructions: Apply moisturizing lotion as directed Topical: Gentamicin 1 x Per Week/30 Days Discharge Instructions: As directed by physician Topical: Mupirocin Ointment 1 x Per Week/30 Days Discharge Instructions: Apply Mupirocin (Bactroban)  as instructed Prim Dressing: PolyMem Non-Adhesive Dressing, 4x4 in 1 x Per Week/30 Days ary Discharge Instructions: Apply to wound bed as instructed Secured With: Elastic Bandage 4 inch (ACE bandage) 1 x Per Week/30 Days Discharge Instructions: Secure with ACE bandage as directed. Secured With: [employer], 4.5x3.1 (in/yd) 1 x Per Week/30 Days Discharge Instructions: Secure with Kerlix as directed. Electronic Signature(s) Signed: 03/24/2023 2:51:21 PM By: Duanne Guess MD FACS Entered By: Duanne Guess on 03/24/2023 11:49:35 -------------------------------------------------------------------------------- Problem List Details  Week/30 Days Discharge Instructions: As directed by physician Topical: Mupirocin Ointment 1 x Per Week/30 Days Discharge Instructions: Apply Mupirocin (Bactroban) as instructed Prim Dressing: PolyMem Non-Adhesive Dressing, 4x4 in 1 x Per Week/30 Days ary Discharge Instructions: Apply to wound bed as instructed Secured With: Elastic Bandage 4 inch (ACE bandage) 1 x Per Week/30 Days Discharge Instructions: Secure with ACE bandage as directed. Secured With: [employer], 4.5x3.1 (in/yd) 1 x Per Week/30 Days Discharge Instructions: Secure with Kerlix as directed. 03/24/2023: Although the wound measurements are the same today, visually it looks smaller. There is still a fairly fibrotic surface underneath and slough. Edema remains well-controlled. I used a curette to debride the slough off of the wound surface. We will continue the topical gentamicin and mupirocin with PolyMem Ag and Kerlix and Ace bandage compression. Follow-up in 1 week. Electronic Signature(s) Signed: 03/24/2023 2:50:40 PM By: Duanne Guess MD FACS Entered By: Duanne Guess on 03/24/2023 11:50:40 Erin [name] ([id number]) [phone].pdf Page 8 of 9 -------------------------------------------------------------------------------- HxROS Details Patient Name: Date of Service: [name], [name] 03/24/2023 2:00 PM Medical Record Number: [id number] Patient Account Number: [id number] Date of Birth/Sex: Treating RN: [birth year] (82 y.o. Erin [name] Primary Care Provider: [name] Other Clinician: Referring Provider: Treating Provider/Extender: [name] Weeks in Treatment: 16 Eyes Medical History: Positive for: Cataracts Hematologic/Lymphatic Medical History: Past Medical History Notes: thrombocytopenia Cardiovascular Medical History: Positive for: Arrhythmia - bundle branch block; Hypertension; Peripheral Venous Disease Gastrointestinal Medical History: Past Medical History Notes: diverticulosis, GERD Endocrine Medical History: Past Medical History Notes: hypothyroidism Genitourinary Medical History: Past Medical History Notes: CKD stage III Musculoskeletal Medical History: Positive for: Gout; Osteoarthritis Past Medical History Notes: DJD, osteoporosis Oncologic Medical History: Past Medical History Notes: breast cancer HBO Extended History Items Eyes: Cataracts Immunizations Pneumococcal Vaccine: Received Pneumococcal Vaccination: Yes Received Pneumococcal Vaccination On or After 60th Birthday: No Implantable Devices No devices added Hospitalization / Surgery History Type of Hospitalization/Surgery Erin [name] ([id number]) [phone].pdf Page 9 of 9 Salpingoophorectomy Colon resection Appendectomy Cystoscopy w/ retrogrades Joint replacement Mastectomy right gastric ulcer repair Abdominal hysterectomy Family and Social History Cancer: Yes - Siblings; Diabetes: No; Heart Disease: No; Hereditary Spherocytosis: No;  Hypertension: No; Kidney Disease: No; Lung Disease: Yes - Siblings; Seizures: No; Stroke: No; Thyroid Problems: No; Tuberculosis: No; Never smoker; Marital Status - Widowed; Alcohol Use: Never; Drug Use: No History; Caffeine Use: Moderate; Financial Concerns: No; Food, Clothing or Shelter Needs: No; Support System Lacking: No; Transportation Concerns: No Psychologist, prison and probation services) Signed: 03/24/2023 2:51:21 PM By: Duanne Guess MD FACS Signed: 03/24/2023 4:41:09 PM By: Zenaida Deed RN, BSN Entered By: Duanne Guess on 03/24/2023 11:48:56 -------------------------------------------------------------------------------- SuperBill Details Patient Name: Date of Service: [name], [name] 03/24/2023 Medical Record Number: [id number] Patient Account Number: [id number] Date of Birth/Sex: Treating RN: [birth year] (82 y.o. Erin [name] Primary Care Provider: [name] Other Clinician: Referring Provider: Treating Provider/Extender: [name] Weeks in Treatment: 16 Diagnosis Coding ICD-10 Codes Code Description [id number] Non-pressure chronic ulcer of other part of left lower leg with fat layer exposed I87.332 Chronic venous hypertension (idiopathic) with ulcer and inflammation of left lower extremity I10 Essential (primary) hypertension Facility Procedures : CPT4 Code: [id number] Description: 97597 - DEBRIDE WOUND 1ST 20 SQ CM OR < ICD-10 Diagnosis Description L97.822 Non-pressure chronic ulcer of other part of left lower leg with fat layer exposed Modifier: Quantity: 1 Physician Procedures : CPT4 Code Description Modifier  Week/30 Days Discharge Instructions: As directed by physician Topical: Mupirocin Ointment 1 x Per Week/30 Days Discharge Instructions: Apply Mupirocin (Bactroban) as instructed Prim Dressing: PolyMem Non-Adhesive Dressing, 4x4 in 1 x Per Week/30 Days ary Discharge Instructions: Apply to wound bed as instructed Secured With: Elastic Bandage 4 inch (ACE bandage) 1 x Per Week/30 Days Discharge Instructions: Secure with ACE bandage as directed. Secured With: [employer], 4.5x3.1 (in/yd) 1 x Per Week/30 Days Discharge Instructions: Secure with Kerlix as directed. 03/24/2023: Although the wound measurements are the same today, visually it looks smaller. There is still a fairly fibrotic surface underneath and slough. Edema remains well-controlled. I used a curette to debride the slough off of the wound surface. We will continue the topical gentamicin and mupirocin with PolyMem Ag and Kerlix and Ace bandage compression. Follow-up in 1 week. Electronic Signature(s) Signed: 03/24/2023 2:50:40 PM By: Duanne Guess MD FACS Entered By: Duanne Guess on 03/24/2023 11:50:40 Erin [name] ([id number]) [phone].pdf Page 8 of 9 -------------------------------------------------------------------------------- HxROS Details Patient Name: Date of Service: [name], [name] 03/24/2023 2:00 PM Medical Record Number: [id number] Patient Account Number: [id number] Date of Birth/Sex: Treating RN: [birth year] (82 y.o. Erin [name] Primary Care Provider: [name] Other Clinician: Referring Provider: Treating Provider/Extender: [name] Weeks in Treatment: 16 Eyes Medical History: Positive for: Cataracts Hematologic/Lymphatic Medical History: Past Medical History Notes: thrombocytopenia Cardiovascular Medical History: Positive for: Arrhythmia - bundle branch block; Hypertension; Peripheral Venous Disease Gastrointestinal Medical History: Past Medical History Notes: diverticulosis, GERD Endocrine Medical History: Past Medical History Notes: hypothyroidism Genitourinary Medical History: Past Medical History Notes: CKD stage III Musculoskeletal Medical History: Positive for: Gout; Osteoarthritis Past Medical History Notes: DJD, osteoporosis Oncologic Medical History: Past Medical History Notes: breast cancer HBO Extended History Items Eyes: Cataracts Immunizations Pneumococcal Vaccine: Received Pneumococcal Vaccination: Yes Received Pneumococcal Vaccination On or After 60th Birthday: No Implantable Devices No devices added Hospitalization / Surgery History Type of Hospitalization/Surgery Erin [name] ([id number]) [phone].pdf Page 9 of 9 Salpingoophorectomy Colon resection Appendectomy Cystoscopy w/ retrogrades Joint replacement Mastectomy right gastric ulcer repair Abdominal hysterectomy Family and Social History Cancer: Yes - Siblings; Diabetes: No; Heart Disease: No; Hereditary Spherocytosis: No;  Hypertension: No; Kidney Disease: No; Lung Disease: Yes - Siblings; Seizures: No; Stroke: No; Thyroid Problems: No; Tuberculosis: No; Never smoker; Marital Status - Widowed; Alcohol Use: Never; Drug Use: No History; Caffeine Use: Moderate; Financial Concerns: No; Food, Clothing or Shelter Needs: No; Support System Lacking: No; Transportation Concerns: No Psychologist, prison and probation services) Signed: 03/24/2023 2:51:21 PM By: Duanne Guess MD FACS Signed: 03/24/2023 4:41:09 PM By: Zenaida Deed RN, BSN Entered By: Duanne Guess on 03/24/2023 11:48:56 -------------------------------------------------------------------------------- SuperBill Details Patient Name: Date of Service: [name], [name] 03/24/2023 Medical Record Number: [id number] Patient Account Number: [id number] Date of Birth/Sex: Treating RN: [birth year] (82 y.o. Erin [name] Primary Care Provider: [name] Other Clinician: Referring Provider: Treating Provider/Extender: [name] Weeks in Treatment: 16 Diagnosis Coding ICD-10 Codes Code Description [id number] Non-pressure chronic ulcer of other part of left lower leg with fat layer exposed I87.332 Chronic venous hypertension (idiopathic) with ulcer and inflammation of left lower extremity I10 Essential (primary) hypertension Facility Procedures : CPT4 Code: [id number] Description: 97597 - DEBRIDE WOUND 1ST 20 SQ CM OR < ICD-10 Diagnosis Description L97.822 Non-pressure chronic ulcer of other part of left lower leg with fat layer exposed Modifier: Quantity: 1 Physician Procedures : CPT4 Code Description Modifier  smaller. There is still a fairly fibrotic surface underneath and slough. Edema remains well-controlled. Electronic Signature(s) Signed: 03/24/2023 2:47:00 PM By: Duanne Guess MD FACS Entered By: Duanne Guess on 03/24/2023 11:47:00 -------------------------------------------------------------------------------- Physical Exam Details Patient Name: Date of Service: [name], [name] 03/24/2023 2:00 PM Medical Record Number: [id number] Patient Account Number: [id number] Date of Birth/Sex: Treating RN: [birth year] (82 y.o. Erin [name] Primary Care Provider: [name] Other Clinician: Referring Provider: Treating Provider/Extender: [name], [name] in Treatment: 16 Constitutional Hypertensive, asymptomatic. . . . no acute distress. Respiratory Normal work of breathing on room air. Notes 03/24/2023: Although the wound measurements are the same today, visually it looks smaller. There is still a fairly fibrotic surface underneath and slough. Edema remains well-controlled. Electronic Signature(s) Signed: 03/24/2023 2:49:20 PM By: Duanne Guess MD FACS Entered By: Duanne Guess on 03/24/2023 11:49:19 -------------------------------------------------------------------------------- Physician Orders Details Patient Name: Date of Service: [name], [name] 03/24/2023 2:00 PM Medical Record Number: [id number] Patient Account Number: [id number] Date of Birth/Sex: Treating RN: 30-May-1941 (82 y.o. 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 - Dr. Lady Gary - room 1 Return A Tuesday 9/24 @ 2:00 pm Anesthetic (In clinic) Topical Lidocaine 4% applied to wound bed Erin [name], Erin [name] ([id number]) [phone].pdf Page 4 of 9 Bathing/ Shower/ Hygiene May shower with protection but do not get wound dressing(s) wet. Protect dressing(s) with water repellant cover (for example, large plastic bag) or a cast cover and may then take shower. Edema Control - Lymphedema / SCD / Other Elevate legs to the level of the heart or above for 30 minutes daily and/or when sitting for 3-4 times a day throughout the day. Avoid standing for long periods of time. Exercise regularly Wound Treatment Wound #1 - Lower Leg Wound Laterality: Left, Medial Cleanser: Soap and Water 1 x Per Week/30 Days Discharge Instructions: May shower and wash wound with dial antibacterial soap and water prior to dressing change. Cleanser: Wound Cleanser 1 x Per Week/30 Days Discharge Instructions: Cleanse the wound with wound cleanser prior to applying a clean dressing using gauze sponges, not tissue or cotton balls. Peri-Wound Care: Triamcinolone 15 (g) 1 x Per Week/30 Days Discharge Instructions: Use triamcinolone 15 (g) as directed Peri-Wound Care: Sween Lotion (Moisturizing lotion) 1 x Per Week/30 Days Discharge Instructions: Apply moisturizing lotion as directed Topical: Gentamicin 1 x Per Week/30 Days Discharge Instructions: As directed by physician Topical: Mupirocin Ointment 1 x Per Week/30 Days Discharge Instructions: Apply Mupirocin (Bactroban)  as instructed Prim Dressing: PolyMem Non-Adhesive Dressing, 4x4 in 1 x Per Week/30 Days ary Discharge Instructions: Apply to wound bed as instructed Secured With: Elastic Bandage 4 inch (ACE bandage) 1 x Per Week/30 Days Discharge Instructions: Secure with ACE bandage as directed. Secured With: [employer], 4.5x3.1 (in/yd) 1 x Per Week/30 Days Discharge Instructions: Secure with Kerlix as directed. Electronic Signature(s) Signed: 03/24/2023 2:51:21 PM By: Duanne Guess MD FACS Entered By: Duanne Guess on 03/24/2023 11:49:35 -------------------------------------------------------------------------------- Problem List Details

## 2023-03-31 ENCOUNTER — Encounter (HOSPITAL_BASED_OUTPATIENT_CLINIC_OR_DEPARTMENT_OTHER): Payer: Medicare HMO | Admitting: General Surgery

## 2023-03-31 DIAGNOSIS — I872 Venous insufficiency (chronic) (peripheral): Secondary | ICD-10-CM | POA: Diagnosis not present

## 2023-03-31 DIAGNOSIS — I129 Hypertensive chronic kidney disease with stage 1 through stage 4 chronic kidney disease, or unspecified chronic kidney disease: Secondary | ICD-10-CM | POA: Diagnosis not present

## 2023-03-31 DIAGNOSIS — N183 Chronic kidney disease, stage 3 unspecified: Secondary | ICD-10-CM | POA: Diagnosis not present

## 2023-03-31 DIAGNOSIS — L97822 Non-pressure chronic ulcer of other part of left lower leg with fat layer exposed: Secondary | ICD-10-CM | POA: Diagnosis not present

## 2023-03-31 DIAGNOSIS — I87332 Chronic venous hypertension (idiopathic) with ulcer and inflammation of left lower extremity: Secondary | ICD-10-CM | POA: Diagnosis not present

## 2023-03-31 NOTE — Progress Notes (Signed)
Days ary Discharge Instructions: Apply to wound bed as instructed Secured With: Elastic Bandage 4 inch (ACE bandage) 1 x Per Week/30 Days Discharge Instructions: Secure with ACE bandage as directed. Secured With: [employer], 4.5x3.1 (in/yd) 1 x Per Week/30 Days Discharge Instructions: Secure with Kerlix as directed. 03/31/2023: She has a new wound that was identified when her wraps and dressings were removed today. It is just to the midline of the anterior tibial surface. The fat layer is exposed. There is a little bit of eschar and dry skin along with some thin slough. The wound closer to her ankle measured smaller today. The surface continues to  improve although portions of it still remain fibrotic. There is slough buildup present. I used a curette to debride eschar, dry skin, and slough from the new wound. I debrided slough from the pre-existing wound. We will continue to apply topical gentamicin and mupirocin with PolyMem Ag. We will use this on the new wound as well, Kerlix and Ace bandage for compression. I suspect the new wound happened as a result of friction from the Ace bandage sliding. The patient and her daughter were advised that they could certainly rewrap the Ace bandage if it slid or became bunched up. Follow-up in 1 week. Electronic Signature(s) Signed: 03/31/2023 2:41:44 PM By: Erin Guess MD FACS Entered By: Erin [name] on 03/31/2023 14:41:43 -------------------------------------------------------------------------------- HxROS Details Patient Name: Date of Service: [name], [name] 03/31/2023 2:00 PM Medical Record Number: [id number] Patient Account Number: [id number] Date of Birth/Sex: Treating RN: [birth year] (82 y.o. F) Primary Care Provider: [name] Other Clinician: Referring Provider: Treating Provider/Extender: Erin [name], Erin [name] in Treatment: 17 Eyes Medical History: Positive for: Cataracts Hematologic/Lymphatic Medical History: Past Medical History NotesMarland Kitchen Erin [name], Erin [name] ([id number]) [phone].pdf Page 10 of 11 thrombocytopenia Cardiovascular Medical History: Positive for: Arrhythmia - bundle branch block; Hypertension; Peripheral Venous Disease Gastrointestinal Medical History: Past Medical History Notes: diverticulosis, GERD Endocrine Medical History: Past Medical History Notes: hypothyroidism Genitourinary Medical History: Past Medical History Notes: CKD stage III Musculoskeletal Medical History: Positive for: Gout; Osteoarthritis Past Medical History Notes: DJD, osteoporosis Oncologic Medical History: Past  Medical History Notes: breast cancer HBO Extended History Items Eyes: Cataracts Immunizations Pneumococcal Vaccine: Received Pneumococcal Vaccination: Yes Received Pneumococcal Vaccination On or After 60th Birthday: No Implantable Devices No devices added Hospitalization / Surgery History Type of Hospitalization/Surgery Salpingoophorectomy Colon resection Appendectomy Cystoscopy w/ retrogrades Joint replacement Mastectomy right gastric ulcer repair Abdominal hysterectomy Family and Social History Cancer: Yes - Siblings; Diabetes: No; Heart Disease: No; Hereditary Spherocytosis: No; Hypertension: No; Kidney Disease: No; Lung Disease: Yes - Siblings; Seizures: No; Stroke: No; Thyroid Problems: No; Tuberculosis: No; Never smoker; Marital Status - Widowed; Alcohol Use: Never; Drug Use: No History; Caffeine Use: Moderate; Financial Concerns: No; Food, Clothing or Shelter Needs: No; Support System Lacking: No; Transportation Concerns: No Electronic Signature(s) Signed: 03/31/2023 2:42:22 PM By: Erin Guess MD FACS Entered By: Erin [name] on 03/31/2023 14:39:36 Erin [name] ([id number]) [phone].pdf Page 11 of 11 -------------------------------------------------------------------------------- SuperBill Details Patient Name: Date of Service: [name], [name] 03/31/2023 Medical Record Number: [id number] Patient Account Number: [id number] Date of Birth/Sex: Treating RN: January 30, 1941 (82 y.o. F) Primary Care Provider: [name] Other Clinician: Referring Provider: Treating Provider/Extender: Erin [name], Serafina Royals [name] in Treatment: 17 Diagnosis Coding ICD-10 Codes Code Description [phone] Non-pressure chronic ulcer of other part of left lower leg with fat layer exposed I87.332 Chronic venous  Erin [name] on 03/31/2023 14:30:49 Erin, [name] ([id number]) [id number] Page 3 of 11 -------------------------------------------------------------------------------- HPI Details Patient Name: Date of Service: [name], [name] 03/31/2023 2:00 PM Medical Record Number: [id number] Patient Account Number: [id number] Date of Birth/Sex: Treating RN: [birth year] (82 y.o. F) Primary Care Provider: [name] Other Clinician: Referring Provider: Treating Provider/Extender: [name] [name] in Treatment: 17 History of Present Illness HPI Description: ADMISSION 11/26/2022 This is an 82 year old woman who was referred by her primary care provider for a persistent nonhealing wound on her left lower extremity. It has been present since February of this year. 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 I do not see any formal venous reflux studies in the electronic medical record. She is not diabetic. She does not smoke. Her PCP has been washing her leg each week and applying a Kerlix and Coban wrap, but has not performed any formal  debridement and has not been using any sort of topical contact layer or ointment. ABI in clinic was noncompressible, but she has a palpable pedal pulse. 12/10/2022: The wound is a bit smaller and cleaner today. There is still fairly heavy slough on the surface. Edema control is acceptable. 12/16/2022: The wound is measuring the slightest bit smaller today. There is still thick slough on the surface. Edema control is good. 12/23/2022: The wound is a little bit smaller again today. Still with fairly heavy slough accumulation. Edema control is good. She has a new small wound on the proximal portion of the same leg. It looks as though she may have developed a small blister over the weekend subsequently ruptured. There is minimal slough on the surface. 12/30/2022: The wound is about the same size, but substantially cleaner. There is some hypertrophic granulation tissue starting to emerge. The wound that was new last week has closed but she has a different new wound today on the lateral aspect of her left lower leg. It also appears to have been a blister that opened and ruptured. There is a little bit of slough on the surface. 01/06/2023: She has new wounds today. She has a wound on the PIP knuckle of her left third and fourth toes, as well as an anterior tibial wound. She says that the wrap was too tight and it also looks like when she pushes her foot into her shoe, it shifts the wrapping back to her midfoot causing bunching up of the wrap material and pain. The lateral leg wounds are both smaller with a little slough and eschar present. The original medial leg wound is smaller and more superficial. There is slough on the surface. 01/13/2023: She has new wounds today. The anterior tibial wound has a satellite lesion that has opened up adjacent to it. Has additional wounds on her fifth toe in addition to the existing ones on her third and fourth toe. The lateral leg wounds seem to have healed. The original medial  leg wound is also smaller, but she and her daughter have several questions regarding why we are not making forward progress, all of which are very reasonable. 01/20/2023: The wounds on her toes have healed. There is a tiny wound on the dorsum of her foot with a little bit of slough on it. The anterolateral leg wound has epithelialized quite substantially and the satellite that had opened last week is now closed. The medial leg wound measured narrower today. It  has hypertrophic granulation tissue and slough present. 01/27/2023: The small wounds have all healed. The medial leg wound is smaller as well, with some slough on the surface but without reaccumulation of the hypertrophic granulation tissue. 02/03/2023: The only remaining open wound is the left medial leg wound. It is smaller and a bit more superficial. There is still slough accumulation on the surface. No hypertrophic granulation tissue. She had both her venous reflux and lower extremity arterial studies, both of which were essentially normal, although her TBI's are slightly diminished. 02/10/2023: The left medial leg wound has some granulation tissue filling in, but overall, it is still fairly fibrotic. There is some slough on the surface today. 02/18/2023: The moisture balance and surface consistency have both improved. There is some slough over granulation tissue. Edema control is good. 02/24/2023: The wound measured smaller today. Moisture balance is good. Slough has reaccumulated. Edema remains well-controlled. 03/03/2023: The wound is a little bit smaller today. Moisture balance is stable. Minimal slough accumulation. There is more granulation tissue filling in over the fibrotic surface. Edema is well-controlled. 03/10/2023: The wound is smaller again today. Moisture balance is good. Light slough accumulation on the fibrotic surface. Edema is well-controlled. 03/17/2023: The wound is slightly smaller again today. There is minimal slough  accumulation but the underlying surface remains quite fibrotic. Edema control is good. 03/24/2023: Although the wound measurements are the same today, visually it looks smaller. There is still a fairly fibrotic surface underneath and slough. Edema remains well-controlled. 03/31/2023: She has a new wound that was identified when her wraps and dressings were removed today. It is just to the midline of the anterior tibial surface. The fat layer is exposed. There is a little bit of eschar and dry skin along with some thin slough. The wound closer to her ankle measured smaller today. The surface continues to improve although portions of it still remain fibrotic. There is slough buildup present. Electronic Signature(s) Signed: 03/31/2023 2:38:55 PM By: Erin Guess MD FACS Entered By: Erin [name] on 03/31/2023 14:38:55 Erin [name] ([id number]) [id number] Page 4 of 11 -------------------------------------------------------------------------------- Physical Exam Details Patient Name: Date of Service: [name], [name] 03/31/2023 2:00 PM Medical Record Number: [id number] Patient Account Number: [id number] Date of Birth/Sex: Treating RN: [birth year] (82 y.o. F) Primary Care Provider: [name] Other Clinician: Referring Provider: Treating Provider/Extender: Erin [name], Erin [name] in Treatment: 17 Constitutional Hypertensive, asymptomatic. . . . no acute distress. Respiratory Normal work of breathing on room air. Notes 03/31/2023: She has a new wound that was identified when her wraps and dressings were removed today. It is just to the midline of the anterior tibial surface. The fat layer is exposed. There is a little bit of eschar and dry skin along with some thin slough. The wound closer to her ankle measured smaller today. The surface continues to improve although portions of it still remain fibrotic. There is slough buildup  present. Electronic Signature(s) Signed: 03/31/2023 2:40:03 PM By: Erin Guess MD FACS Entered By: Erin [name] on 03/31/2023 14:40:03 -------------------------------------------------------------------------------- Physician Orders Details Patient Name: Date of Service: [name], [name] 03/31/2023 2:00 PM Medical Record Number: [id number] Patient Account Number: [id number] Date of Birth/Sex: Treating RN: 1940/08/27 (82 y.o. [name] Primary Care Provider: [name] Other Clinician: Referring Provider: Treating Provider/Extender: [name] [name] in Treatment: 17 The following information was scribed by: Zenaida Deed The information was scribed for: Erin [name]  has hypertrophic granulation tissue and slough present. 01/27/2023: The small wounds have all healed. The medial leg wound is smaller as well, with some slough on the surface but without reaccumulation of the hypertrophic granulation tissue. 02/03/2023: The only remaining open wound is the left medial leg wound. It is smaller and a bit more superficial. There is still slough accumulation on the surface. No hypertrophic granulation tissue. She had both her venous reflux and lower extremity arterial studies, both of which were essentially normal, although her TBI's are slightly diminished. 02/10/2023: The left medial leg wound has some granulation tissue filling in, but overall, it is still fairly fibrotic. There is some slough on the surface today. 02/18/2023: The moisture balance and surface consistency have both improved. There is some slough over granulation tissue. Edema control is good. 02/24/2023: The wound measured smaller today. Moisture balance is good. Slough has reaccumulated. Edema remains well-controlled. 03/03/2023: The wound is a little bit smaller today. Moisture balance is stable. Minimal slough accumulation. There is more granulation tissue filling in over the fibrotic surface. Edema is well-controlled. 03/10/2023: The wound is smaller again today. Moisture balance is good. Light slough accumulation on the fibrotic surface. Edema is well-controlled. 03/17/2023: The wound is slightly smaller again today. There is minimal slough  accumulation but the underlying surface remains quite fibrotic. Edema control is good. 03/24/2023: Although the wound measurements are the same today, visually it looks smaller. There is still a fairly fibrotic surface underneath and slough. Edema remains well-controlled. 03/31/2023: She has a new wound that was identified when her wraps and dressings were removed today. It is just to the midline of the anterior tibial surface. The fat layer is exposed. There is a little bit of eschar and dry skin along with some thin slough. The wound closer to her ankle measured smaller today. The surface continues to improve although portions of it still remain fibrotic. There is slough buildup present. Electronic Signature(s) Signed: 03/31/2023 2:38:55 PM By: Erin Guess MD FACS Entered By: Erin [name] on 03/31/2023 14:38:55 Erin [name] ([id number]) [id number] Page 4 of 11 -------------------------------------------------------------------------------- Physical Exam Details Patient Name: Date of Service: [name], [name] 03/31/2023 2:00 PM Medical Record Number: [id number] Patient Account Number: [id number] Date of Birth/Sex: Treating RN: [birth year] (82 y.o. F) Primary Care Provider: [name] Other Clinician: Referring Provider: Treating Provider/Extender: Erin [name], Erin [name] in Treatment: 17 Constitutional Hypertensive, asymptomatic. . . . no acute distress. Respiratory Normal work of breathing on room air. Notes 03/31/2023: She has a new wound that was identified when her wraps and dressings were removed today. It is just to the midline of the anterior tibial surface. The fat layer is exposed. There is a little bit of eschar and dry skin along with some thin slough. The wound closer to her ankle measured smaller today. The surface continues to improve although portions of it still remain fibrotic. There is slough buildup  present. Electronic Signature(s) Signed: 03/31/2023 2:40:03 PM By: Erin Guess MD FACS Entered By: Erin [name] on 03/31/2023 14:40:03 -------------------------------------------------------------------------------- Physician Orders Details Patient Name: Date of Service: [name], [name] 03/31/2023 2:00 PM Medical Record Number: [id number] Patient Account Number: [id number] Date of Birth/Sex: Treating RN: 1940/08/27 (82 y.o. [name] Primary Care Provider: [name] Other Clinician: Referring Provider: Treating Provider/Extender: [name] [name] in Treatment: 17 The following information was scribed by: Zenaida Deed The information was scribed for: Erin [name]  has hypertrophic granulation tissue and slough present. 01/27/2023: The small wounds have all healed. The medial leg wound is smaller as well, with some slough on the surface but without reaccumulation of the hypertrophic granulation tissue. 02/03/2023: The only remaining open wound is the left medial leg wound. It is smaller and a bit more superficial. There is still slough accumulation on the surface. No hypertrophic granulation tissue. She had both her venous reflux and lower extremity arterial studies, both of which were essentially normal, although her TBI's are slightly diminished. 02/10/2023: The left medial leg wound has some granulation tissue filling in, but overall, it is still fairly fibrotic. There is some slough on the surface today. 02/18/2023: The moisture balance and surface consistency have both improved. There is some slough over granulation tissue. Edema control is good. 02/24/2023: The wound measured smaller today. Moisture balance is good. Slough has reaccumulated. Edema remains well-controlled. 03/03/2023: The wound is a little bit smaller today. Moisture balance is stable. Minimal slough accumulation. There is more granulation tissue filling in over the fibrotic surface. Edema is well-controlled. 03/10/2023: The wound is smaller again today. Moisture balance is good. Light slough accumulation on the fibrotic surface. Edema is well-controlled. 03/17/2023: The wound is slightly smaller again today. There is minimal slough  accumulation but the underlying surface remains quite fibrotic. Edema control is good. 03/24/2023: Although the wound measurements are the same today, visually it looks smaller. There is still a fairly fibrotic surface underneath and slough. Edema remains well-controlled. 03/31/2023: She has a new wound that was identified when her wraps and dressings were removed today. It is just to the midline of the anterior tibial surface. The fat layer is exposed. There is a little bit of eschar and dry skin along with some thin slough. The wound closer to her ankle measured smaller today. The surface continues to improve although portions of it still remain fibrotic. There is slough buildup present. Electronic Signature(s) Signed: 03/31/2023 2:38:55 PM By: Erin Guess MD FACS Entered By: Erin [name] on 03/31/2023 14:38:55 Erin [name] ([id number]) [id number] Page 4 of 11 -------------------------------------------------------------------------------- Physical Exam Details Patient Name: Date of Service: [name], [name] 03/31/2023 2:00 PM Medical Record Number: [id number] Patient Account Number: [id number] Date of Birth/Sex: Treating RN: [birth year] (82 y.o. F) Primary Care Provider: [name] Other Clinician: Referring Provider: Treating Provider/Extender: Erin [name], Erin [name] in Treatment: 17 Constitutional Hypertensive, asymptomatic. . . . no acute distress. Respiratory Normal work of breathing on room air. Notes 03/31/2023: She has a new wound that was identified when her wraps and dressings were removed today. It is just to the midline of the anterior tibial surface. The fat layer is exposed. There is a little bit of eschar and dry skin along with some thin slough. The wound closer to her ankle measured smaller today. The surface continues to improve although portions of it still remain fibrotic. There is slough buildup  present. Electronic Signature(s) Signed: 03/31/2023 2:40:03 PM By: Erin Guess MD FACS Entered By: Erin [name] on 03/31/2023 14:40:03 -------------------------------------------------------------------------------- Physician Orders Details Patient Name: Date of Service: [name], [name] 03/31/2023 2:00 PM Medical Record Number: [id number] Patient Account Number: [id number] Date of Birth/Sex: Treating RN: 1940/08/27 (82 y.o. [name] Primary Care Provider: [name] Other Clinician: Referring Provider: Treating Provider/Extender: [name] [name] in Treatment: 17 The following information was scribed by: Zenaida Deed The information was scribed for: Erin [name]  N ([id number]) [id number] Page 6 of 11 Primary Care Provider: [name] Other Clinician: Referring Provider: Treating Provider/Extender: [name] [name] in Treatment: 17 Active Problems ICD-10 Encounter Code Description Active Date MDM Diagnosis L97.822 Non-pressure chronic ulcer of other part of left lower leg with fat layer exposed5/22/2024 No Yes I87.332 Chronic venous hypertension (idiopathic) with ulcer and inflammation of left 11/26/2022 No Yes lower extremity I10 Essential (primary) hypertension 11/26/2022 No Yes Inactive Problems Resolved Problems ICD-10 Code Description Active Date Resolved Date L97.522 Non-pressure chronic ulcer of other part of left foot with fat layer exposed 01/13/2023 01/13/2023 Electronic Signature(s) Signed: 03/31/2023 2:37:42 PM By: Erin Guess MD FACS Entered By: Erin [name] on 03/31/2023  14:37:42 -------------------------------------------------------------------------------- Progress Note Details Patient Name: Date of Service: [name], [name] 03/31/2023 2:00 PM Medical Record Number: [id number] Patient Account Number: [id number] Date of Birth/Sex: Treating RN: 09-26-1940 (82 y.o. F) Primary Care Provider: [name] Other Clinician: Referring Provider: Treating Provider/Extender: [name] [name] in Treatment: 64 Subjective Chief Complaint Information obtained from Patient Patient presents for treatment of an open ulcer due to venous insufficiency History of Present Illness (HPI) ADMISSION 11/26/2022 This is an 82 year old woman who was referred by her primary care provider for a persistent nonhealing wound on her left lower extremity. It has been present since February of this year. 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 I do not see any formal venous reflux studies in the electronic medical record. She is not diabetic. She does not smoke. Her PCP has been washing her leg each week and applying a Kerlix and Coban wrap, but has not performed any formal debridement and has not been using any sort of topical contact layer or ointment. ABI in clinic was noncompressible, but she has a palpable pedal pulse. 12/10/2022: The wound is a bit smaller and cleaner today. There is still fairly heavy slough on the surface. Edema control is acceptable. 12/16/2022: The wound is measuring the slightest bit smaller today. There is still thick slough on the surface. Edema control is good. 12/23/2022: The wound is a little bit smaller again today. Still with fairly heavy slough accumulation. Edema control is good. She has a new small wound on the proximal portion of the same leg. It looks as though she may have developed a small blister over the weekend  subsequently ruptured. There is minimal slough on the surface. Erin [name], Erin [name] ([id number]) [id number] Page 7 of 11 12/30/2022: The wound is about the same size, but substantially cleaner. There is some hypertrophic granulation tissue starting to emerge. The wound that was new last week has closed but she has a different new wound today on the lateral aspect of her left lower leg. It also appears to have been a blister that opened and ruptured. There is a little bit of slough on the surface. 01/06/2023: She has new wounds today. She has a wound on the PIP knuckle of her left third and fourth toes, as well as an anterior tibial wound. She says that the wrap was too tight and it also looks like when she pushes her foot into her shoe, it shifts the wrapping back to her midfoot causing bunching up of the wrap material and pain. The lateral leg wounds are both smaller with a little slough and eschar present. The original medial leg wound is smaller and more superficial.  Erin [name] on 03/31/2023 14:30:49 Erin, [name] ([id number]) [id number] Page 3 of 11 -------------------------------------------------------------------------------- HPI Details Patient Name: Date of Service: [name], [name] 03/31/2023 2:00 PM Medical Record Number: [id number] Patient Account Number: [id number] Date of Birth/Sex: Treating RN: [birth year] (82 y.o. F) Primary Care Provider: [name] Other Clinician: Referring Provider: Treating Provider/Extender: [name] [name] in Treatment: 17 History of Present Illness HPI Description: ADMISSION 11/26/2022 This is an 82 year old woman who was referred by her primary care provider for a persistent nonhealing wound on her left lower extremity. It has been present since February of this year. 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 I do not see any formal venous reflux studies in the electronic medical record. She is not diabetic. She does not smoke. Her PCP has been washing her leg each week and applying a Kerlix and Coban wrap, but has not performed any formal  debridement and has not been using any sort of topical contact layer or ointment. ABI in clinic was noncompressible, but she has a palpable pedal pulse. 12/10/2022: The wound is a bit smaller and cleaner today. There is still fairly heavy slough on the surface. Edema control is acceptable. 12/16/2022: The wound is measuring the slightest bit smaller today. There is still thick slough on the surface. Edema control is good. 12/23/2022: The wound is a little bit smaller again today. Still with fairly heavy slough accumulation. Edema control is good. She has a new small wound on the proximal portion of the same leg. It looks as though she may have developed a small blister over the weekend subsequently ruptured. There is minimal slough on the surface. 12/30/2022: The wound is about the same size, but substantially cleaner. There is some hypertrophic granulation tissue starting to emerge. The wound that was new last week has closed but she has a different new wound today on the lateral aspect of her left lower leg. It also appears to have been a blister that opened and ruptured. There is a little bit of slough on the surface. 01/06/2023: She has new wounds today. She has a wound on the PIP knuckle of her left third and fourth toes, as well as an anterior tibial wound. She says that the wrap was too tight and it also looks like when she pushes her foot into her shoe, it shifts the wrapping back to her midfoot causing bunching up of the wrap material and pain. The lateral leg wounds are both smaller with a little slough and eschar present. The original medial leg wound is smaller and more superficial. There is slough on the surface. 01/13/2023: She has new wounds today. The anterior tibial wound has a satellite lesion that has opened up adjacent to it. Has additional wounds on her fifth toe in addition to the existing ones on her third and fourth toe. The lateral leg wounds seem to have healed. The original medial  leg wound is also smaller, but she and her daughter have several questions regarding why we are not making forward progress, all of which are very reasonable. 01/20/2023: The wounds on her toes have healed. There is a tiny wound on the dorsum of her foot with a little bit of slough on it. The anterolateral leg wound has epithelialized quite substantially and the satellite that had opened last week is now closed. The medial leg wound measured narrower today. It  has hypertrophic granulation tissue and slough present. 01/27/2023: The small wounds have all healed. The medial leg wound is smaller as well, with some slough on the surface but without reaccumulation of the hypertrophic granulation tissue. 02/03/2023: The only remaining open wound is the left medial leg wound. It is smaller and a bit more superficial. There is still slough accumulation on the surface. No hypertrophic granulation tissue. She had both her venous reflux and lower extremity arterial studies, both of which were essentially normal, although her TBI's are slightly diminished. 02/10/2023: The left medial leg wound has some granulation tissue filling in, but overall, it is still fairly fibrotic. There is some slough on the surface today. 02/18/2023: The moisture balance and surface consistency have both improved. There is some slough over granulation tissue. Edema control is good. 02/24/2023: The wound measured smaller today. Moisture balance is good. Slough has reaccumulated. Edema remains well-controlled. 03/03/2023: The wound is a little bit smaller today. Moisture balance is stable. Minimal slough accumulation. There is more granulation tissue filling in over the fibrotic surface. Edema is well-controlled. 03/10/2023: The wound is smaller again today. Moisture balance is good. Light slough accumulation on the fibrotic surface. Edema is well-controlled. 03/17/2023: The wound is slightly smaller again today. There is minimal slough  accumulation but the underlying surface remains quite fibrotic. Edema control is good. 03/24/2023: Although the wound measurements are the same today, visually it looks smaller. There is still a fairly fibrotic surface underneath and slough. Edema remains well-controlled. 03/31/2023: She has a new wound that was identified when her wraps and dressings were removed today. It is just to the midline of the anterior tibial surface. The fat layer is exposed. There is a little bit of eschar and dry skin along with some thin slough. The wound closer to her ankle measured smaller today. The surface continues to improve although portions of it still remain fibrotic. There is slough buildup present. Electronic Signature(s) Signed: 03/31/2023 2:38:55 PM By: Erin Guess MD FACS Entered By: Erin [name] on 03/31/2023 14:38:55 Erin [name] ([id number]) [id number] Page 4 of 11 -------------------------------------------------------------------------------- Physical Exam Details Patient Name: Date of Service: [name], [name] 03/31/2023 2:00 PM Medical Record Number: [id number] Patient Account Number: [id number] Date of Birth/Sex: Treating RN: [birth year] (82 y.o. F) Primary Care Provider: [name] Other Clinician: Referring Provider: Treating Provider/Extender: Erin [name], Erin [name] in Treatment: 17 Constitutional Hypertensive, asymptomatic. . . . no acute distress. Respiratory Normal work of breathing on room air. Notes 03/31/2023: She has a new wound that was identified when her wraps and dressings were removed today. It is just to the midline of the anterior tibial surface. The fat layer is exposed. There is a little bit of eschar and dry skin along with some thin slough. The wound closer to her ankle measured smaller today. The surface continues to improve although portions of it still remain fibrotic. There is slough buildup  present. Electronic Signature(s) Signed: 03/31/2023 2:40:03 PM By: Erin Guess MD FACS Entered By: Erin [name] on 03/31/2023 14:40:03 -------------------------------------------------------------------------------- Physician Orders Details Patient Name: Date of Service: [name], [name] 03/31/2023 2:00 PM Medical Record Number: [id number] Patient Account Number: [id number] Date of Birth/Sex: Treating RN: 1940/08/27 (82 y.o. [name] Primary Care Provider: [name] Other Clinician: Referring Provider: Treating Provider/Extender: [name] [name] in Treatment: 17 The following information was scribed by: Zenaida Deed The information was scribed for: Erin [name]  Days ary Discharge Instructions: Apply to wound bed as instructed Secured With: Elastic Bandage 4 inch (ACE bandage) 1 x Per Week/30 Days Discharge Instructions: Secure with ACE bandage as directed. Secured With: [employer], 4.5x3.1 (in/yd) 1 x Per Week/30 Days Discharge Instructions: Secure with Kerlix as directed. 03/31/2023: She has a new wound that was identified when her wraps and dressings were removed today. It is just to the midline of the anterior tibial surface. The fat layer is exposed. There is a little bit of eschar and dry skin along with some thin slough. The wound closer to her ankle measured smaller today. The surface continues to  improve although portions of it still remain fibrotic. There is slough buildup present. I used a curette to debride eschar, dry skin, and slough from the new wound. I debrided slough from the pre-existing wound. We will continue to apply topical gentamicin and mupirocin with PolyMem Ag. We will use this on the new wound as well, Kerlix and Ace bandage for compression. I suspect the new wound happened as a result of friction from the Ace bandage sliding. The patient and her daughter were advised that they could certainly rewrap the Ace bandage if it slid or became bunched up. Follow-up in 1 week. Electronic Signature(s) Signed: 03/31/2023 2:41:44 PM By: Erin Guess MD FACS Entered By: Erin [name] on 03/31/2023 14:41:43 -------------------------------------------------------------------------------- HxROS Details Patient Name: Date of Service: [name], [name] 03/31/2023 2:00 PM Medical Record Number: [id number] Patient Account Number: [id number] Date of Birth/Sex: Treating RN: [birth year] (82 y.o. F) Primary Care Provider: [name] Other Clinician: Referring Provider: Treating Provider/Extender: Erin [name], Erin [name] in Treatment: 17 Eyes Medical History: Positive for: Cataracts Hematologic/Lymphatic Medical History: Past Medical History NotesMarland Kitchen Erin [name], Erin [name] ([id number]) [phone].pdf Page 10 of 11 thrombocytopenia Cardiovascular Medical History: Positive for: Arrhythmia - bundle branch block; Hypertension; Peripheral Venous Disease Gastrointestinal Medical History: Past Medical History Notes: diverticulosis, GERD Endocrine Medical History: Past Medical History Notes: hypothyroidism Genitourinary Medical History: Past Medical History Notes: CKD stage III Musculoskeletal Medical History: Positive for: Gout; Osteoarthritis Past Medical History Notes: DJD, osteoporosis Oncologic Medical History: Past  Medical History Notes: breast cancer HBO Extended History Items Eyes: Cataracts Immunizations Pneumococcal Vaccine: Received Pneumococcal Vaccination: Yes Received Pneumococcal Vaccination On or After 60th Birthday: No Implantable Devices No devices added Hospitalization / Surgery History Type of Hospitalization/Surgery Salpingoophorectomy Colon resection Appendectomy Cystoscopy w/ retrogrades Joint replacement Mastectomy right gastric ulcer repair Abdominal hysterectomy Family and Social History Cancer: Yes - Siblings; Diabetes: No; Heart Disease: No; Hereditary Spherocytosis: No; Hypertension: No; Kidney Disease: No; Lung Disease: Yes - Siblings; Seizures: No; Stroke: No; Thyroid Problems: No; Tuberculosis: No; Never smoker; Marital Status - Widowed; Alcohol Use: Never; Drug Use: No History; Caffeine Use: Moderate; Financial Concerns: No; Food, Clothing or Shelter Needs: No; Support System Lacking: No; Transportation Concerns: No Electronic Signature(s) Signed: 03/31/2023 2:42:22 PM By: Erin Guess MD FACS Entered By: Erin [name] on 03/31/2023 14:39:36 Erin [name] ([id number]) [phone].pdf Page 11 of 11 -------------------------------------------------------------------------------- SuperBill Details Patient Name: Date of Service: [name], [name] 03/31/2023 Medical Record Number: [id number] Patient Account Number: [id number] Date of Birth/Sex: Treating RN: January 30, 1941 (82 y.o. F) Primary Care Provider: [name] Other Clinician: Referring Provider: Treating Provider/Extender: Erin [name], Serafina Royals [name] in Treatment: 17 Diagnosis Coding ICD-10 Codes Code Description [phone] Non-pressure chronic ulcer of other part of left lower leg with fat layer exposed I87.332 Chronic venous  has hypertrophic granulation tissue and slough present. 01/27/2023: The small wounds have all healed. The medial leg wound is smaller as well, with some slough on the surface but without reaccumulation of the hypertrophic granulation tissue. 02/03/2023: The only remaining open wound is the left medial leg wound. It is smaller and a bit more superficial. There is still slough accumulation on the surface. No hypertrophic granulation tissue. She had both her venous reflux and lower extremity arterial studies, both of which were essentially normal, although her TBI's are slightly diminished. 02/10/2023: The left medial leg wound has some granulation tissue filling in, but overall, it is still fairly fibrotic. There is some slough on the surface today. 02/18/2023: The moisture balance and surface consistency have both improved. There is some slough over granulation tissue. Edema control is good. 02/24/2023: The wound measured smaller today. Moisture balance is good. Slough has reaccumulated. Edema remains well-controlled. 03/03/2023: The wound is a little bit smaller today. Moisture balance is stable. Minimal slough accumulation. There is more granulation tissue filling in over the fibrotic surface. Edema is well-controlled. 03/10/2023: The wound is smaller again today. Moisture balance is good. Light slough accumulation on the fibrotic surface. Edema is well-controlled. 03/17/2023: The wound is slightly smaller again today. There is minimal slough  accumulation but the underlying surface remains quite fibrotic. Edema control is good. 03/24/2023: Although the wound measurements are the same today, visually it looks smaller. There is still a fairly fibrotic surface underneath and slough. Edema remains well-controlled. 03/31/2023: She has a new wound that was identified when her wraps and dressings were removed today. It is just to the midline of the anterior tibial surface. The fat layer is exposed. There is a little bit of eschar and dry skin along with some thin slough. The wound closer to her ankle measured smaller today. The surface continues to improve although portions of it still remain fibrotic. There is slough buildup present. Electronic Signature(s) Signed: 03/31/2023 2:38:55 PM By: Erin Guess MD FACS Entered By: Erin [name] on 03/31/2023 14:38:55 Erin [name] ([id number]) [id number] Page 4 of 11 -------------------------------------------------------------------------------- Physical Exam Details Patient Name: Date of Service: [name], [name] 03/31/2023 2:00 PM Medical Record Number: [id number] Patient Account Number: [id number] Date of Birth/Sex: Treating RN: [birth year] (82 y.o. F) Primary Care Provider: [name] Other Clinician: Referring Provider: Treating Provider/Extender: Erin [name], Erin [name] in Treatment: 17 Constitutional Hypertensive, asymptomatic. . . . no acute distress. Respiratory Normal work of breathing on room air. Notes 03/31/2023: She has a new wound that was identified when her wraps and dressings were removed today. It is just to the midline of the anterior tibial surface. The fat layer is exposed. There is a little bit of eschar and dry skin along with some thin slough. The wound closer to her ankle measured smaller today. The surface continues to improve although portions of it still remain fibrotic. There is slough buildup  present. Electronic Signature(s) Signed: 03/31/2023 2:40:03 PM By: Erin Guess MD FACS Entered By: Erin [name] on 03/31/2023 14:40:03 -------------------------------------------------------------------------------- Physician Orders Details Patient Name: Date of Service: [name], [name] 03/31/2023 2:00 PM Medical Record Number: [id number] Patient Account Number: [id number] Date of Birth/Sex: Treating RN: 1940/08/27 (82 y.o. [name] Primary Care Provider: [name] Other Clinician: Referring Provider: Treating Provider/Extender: [name] [name] in Treatment: 17 The following information was scribed by: Zenaida Deed The information was scribed for: Erin [name]

## 2023-03-31 NOTE — Progress Notes (Signed)
Comorbid History: Peripheral Venous Disease, Gout, Peripheral Venous Disease, Gout, Osteoarthritis Osteoarthritis 08/29/2022 03/31/2023 N/A Date Acquired: 17 0 N/A [name] of Treatment: Open Open N/A Wound Status: No No N/A Wound Recurrence: 1.5x0.5x0.1 0.9x0.4x0.1 N/A Measurements L x W x D (cm) 0.589 0.283 N/A A (cm) : rea 0.059 0.028 N/A Volume (cm) : 90.00% N/A N/A % Reduction in A rea: 89.90% N/A N/A % Reduction in Volume: Full Thickness Without Exposed Full Thickness Without Exposed N/A Classification: Support Structures Support Structures Medium Medium N/A Exudate A mount: Serosanguineous Serosanguineous N/A Exudate Type: red, brown red, brown N/A Exudate Color: Flat and Intact Flat and Intact N/A Wound Margin: Large (67-100%) Large (67-100%) N/A Granulation A mount: Red Red N/A Granulation Quality: Small (1-33%) None Present (0%) N/A Necrotic A mount: Eschar, Adherent Slough N/A N/A Necrotic Tissue: Fat Layer (Subcutaneous Tissue): Yes Fat Layer (Subcutaneous Tissue): Yes N/A Exposed Structures: Fascia: No Fascia: No Tendon: No Tendon: No Muscle: No Muscle: No Joint: No Joint:  No Bone: No Bone: No Small (1-33%) Small (1-33%) N/A Epithelialization: Debridement - Selective/Open Wound Debridement - Selective/Open Wound N/A Debridement: Pre-procedure Verification/Time Out 14:25 14:25 N/A Taken: Lidocaine 4% Topical Solution Lidocaine 4% Topical Solution N/A Pain Control: Ambulance person N/A Tissue Debrided: Non-Viable Tissue Non-Viable Tissue N/A Level: 0.59 0.28 N/A Debridement A (sq cm): rea Curette Curette N/A Instrument: Minimum Minimum N/A Bleeding: Pressure Pressure N/A Hemostasis A chieved: 0 0 N/A Procedural Pain: 0 0 N/A Post Procedural Pain: Procedure was tolerated well Procedure was tolerated well N/A Debridement Treatment Response: 1.5x0.5x0.1 0.9x0.4x0.1 N/A Post Debridement Measurements L x W x D (cm) 0.059 0.028 N/A Post Debridement Volume: (cm) No Abnormalities Noted No Abnormalities Noted N/A Periwound Skin Texture: No Abnormalities Noted No Abnormalities Noted N/A Periwound Skin Moisture: No Abnormalities Noted Hemosiderin Staining: Yes N/A Periwound Skin Color: No Abnormality No Abnormality N/A Temperature: Yes N/A N/A Tenderness on Palpation: Debridement Debridement N/A Procedures Performed: Treatment Notes Electronic Signature(s) Signed: 03/31/2023 2:37:52 PM By: Erin Guess MD FACS Entered By: Erin [name] on 03/31/2023 14:37:52 -------------------------------------------------------------------------------- Multi-Disciplinary Care Plan Details Patient Name: Date of Service: [name], [name] 03/31/2023 2:00 PM Medical Record Number: [id number] Patient Account Number: [id number] Date of Birth/Sex: Treating RN: [birth year] (82 y.o. Erin [name] Primary Care Erin [name]: Erin [name] Other Clinician: CORNEISHA, [name] ([id number]) [id number] Page 4 of 9 Referring Erin [name]: Treating Erin [name]/Extender: Erin [name] in Treatment:  17 Multidisciplinary Care Plan reviewed with physician Active Inactive Necrotic Tissue Nursing Diagnoses: Impaired tissue integrity related to necrotic/devitalized tissue Knowledge deficit related to management of necrotic/devitalized tissue Goals: Necrotic/devitalized tissue will be minimized in the wound bed Date Initiated: 11/26/2022 Target Resolution Date: 05/06/2023 Goal Status: Active Patient/caregiver will verbalize understanding of reason and process for debridement of necrotic tissue Date Initiated: 11/26/2022 Date Inactivated: 01/20/2023 Target Resolution Date: 03/06/2023 Goal Status: Met Interventions: Assess patient pain level pre-, during and post procedure and prior to discharge Provide education on necrotic tissue and debridement process Treatment Activities: Apply topical anesthetic as ordered : 11/26/2022 Notes: Wound/Skin Impairment Nursing Diagnoses: Impaired tissue integrity Knowledge deficit related to ulceration/compromised skin integrity Goals: Patient/caregiver will verbalize understanding of skin care regimen Date Initiated: 11/26/2022 Target Resolution Date: 05/06/2023 Goal Status: Active Interventions: Assess ulceration(s) every visit Treatment Activities: Skin care regimen initiated : 11/26/2022 Topical wound management initiated : 11/26/2022 Notes: Electronic Signature(s) Signed: 03/31/2023 3:46:08 PM By: Erin Deed RN, BSN Entered By: Erin [name] on 03/31/2023 14:21:55 -------------------------------------------------------------------------------- Pain Assessment  Details Patient Name: Date of Service: [name], [name] RBA RA N. 03/31/2023 2:00 PM Medical Record Number: [id number] Patient Account Number: [id number] Date of Birth/Sex: Treating RN: Jan 05, 1941 (82 y.o. F) Primary Care Gavinn [name]: Erin [name] Other Clinician: Referring Erin [name]: Treating Erin [name]/Extender: Erin [name], Erin [name] in Treatment: 17 Active  Problems Location of Pain Severity and Description of Pain Patient Has Paino No Erin [name], Erin [name] ([id number]) [id number] Page 5 of 9 Patient Has Paino No Site Locations Rate the pain. Current Pain Level: 0 Pain Management and Medication Current Pain Management: Electronic Signature(s) Signed: 03/31/2023 3:46:08 PM By: Erin Deed RN, BSN Entered By: Erin [name] on 03/31/2023 14:12:04 -------------------------------------------------------------------------------- Patient/Caregiver Education Details Patient Name: Date of Service: [name], [name] 9/24/2024andnbsp2:00 PM Medical Record Number: [id number] Patient Account Number: [id number] Date of Birth/Gender: Treating RN: Nov 14, 1940 (82 y.o. Erin [name] Primary Care Physician: Erin [name] Other Clinician: Referring Physician: Treating Physician/Extender: Erin [name] in Treatment: 17 Education Assessment Education Provided To: Patient Education Topics Provided Venous: Methods: Explain/Verbal Responses: Reinforcements needed, State content correctly Wound/Skin Impairment: Methods: Explain/Verbal Responses: Reinforcements needed, State content correctly Electronic Signature(s) Signed: 03/31/2023 3:46:08 PM By: Erin Deed RN, BSN Entered By: Erin [name] on 03/31/2023 14:23:16 Erin [name] ([id number]) [id number] Page 6 of 9 -------------------------------------------------------------------------------- Wound Assessment Details Patient Name: Date of Service: [name], [name] 03/31/2023 2:00 PM Medical Record Number: [id number] Patient Account Number: [id number] Date of Birth/Sex: Treating RN: 1941/07/01 (82 y.o. 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, Gout, Osteoarthritis Clustered Wound: No Photos Wound Measurements Length: (cm) 1.5 Width: (cm) 0.5 Depth: (cm) 0.1 Area: (cm) 0.589 Volume: (cm) 0.059 % Reduction in Area: 90% % Reduction in Volume: 89.9% Epithelialization: Small (1-33%) Tunneling: No Undermining: No Wound Description Classification: Full Thickness Without Exposed Support Structures Wound Margin: Flat and Intact Exudate Amount: Medium Exudate Type: Serosanguineous Exudate Color: red, brown Foul Odor After Cleansing: No Slough/Fibrino Yes Wound Bed Granulation Amount: Large (67-100%) Exposed Structure Granulation Quality: Red Fascia Exposed: No Necrotic Amount: Small (1-33%) Fat Layer (Subcutaneous Tissue) Exposed: Yes Necrotic Quality: Eschar, Adherent Slough Tendon Exposed: No Muscle Exposed: No Joint Exposed: No Bone Exposed: No Periwound Skin Texture Texture Color No Abnormalities Noted: Yes No Abnormalities Noted: Yes Moisture Temperature / Pain No Abnormalities Noted: Yes Temperature: No Abnormality Tenderness on Palpation: Yes Treatment Notes Wound #1 (Lower Leg) Wound Laterality: Left, Medial Cleanser Soap and Water Discharge Instruction: May shower and wash wound with dial antibacterial soap and water prior to dressing change. Wound Cleanser Discharge Instruction: Cleanse the wound with wound cleanser prior to applying a clean dressing using gauze sponges, not tissue or cotton balls. Peri-Wound Care ALAJA, KANIS ([id number]) [id number] Page 7 of 9 Triamcinolone 15 (g) Discharge Instruction: Use triamcinolone 15 (g) as directed Sween Lotion (Moisturizing lotion) Discharge Instruction:  Apply moisturizing lotion as directed Topical Gentamicin Discharge Instruction: As directed by physician Mupirocin Ointment Discharge Instruction: Apply Mupirocin (Bactroban) as instructed Primary Dressing PolyMem Non-Adhesive Dressing, 4x4 in Discharge Instruction: Apply to wound bed as instructed Secondary Dressing Secured With Elastic Bandage 4 inch (ACE bandage) Discharge Instruction: Secure with  Comorbid History: Peripheral Venous Disease, Gout, Peripheral Venous Disease, Gout, Osteoarthritis Osteoarthritis 08/29/2022 03/31/2023 N/A Date Acquired: 17 0 N/A [name] of Treatment: Open Open N/A Wound Status: No No N/A Wound Recurrence: 1.5x0.5x0.1 0.9x0.4x0.1 N/A Measurements L x W x D (cm) 0.589 0.283 N/A A (cm) : rea 0.059 0.028 N/A Volume (cm) : 90.00% N/A N/A % Reduction in A rea: 89.90% N/A N/A % Reduction in Volume: Full Thickness Without Exposed Full Thickness Without Exposed N/A Classification: Support Structures Support Structures Medium Medium N/A Exudate A mount: Serosanguineous Serosanguineous N/A Exudate Type: red, brown red, brown N/A Exudate Color: Flat and Intact Flat and Intact N/A Wound Margin: Large (67-100%) Large (67-100%) N/A Granulation A mount: Red Red N/A Granulation Quality: Small (1-33%) None Present (0%) N/A Necrotic A mount: Eschar, Adherent Slough N/A N/A Necrotic Tissue: Fat Layer (Subcutaneous Tissue): Yes Fat Layer (Subcutaneous Tissue): Yes N/A Exposed Structures: Fascia: No Fascia: No Tendon: No Tendon: No Muscle: No Muscle: No Joint: No Joint:  No Bone: No Bone: No Small (1-33%) Small (1-33%) N/A Epithelialization: Debridement - Selective/Open Wound Debridement - Selective/Open Wound N/A Debridement: Pre-procedure Verification/Time Out 14:25 14:25 N/A Taken: Lidocaine 4% Topical Solution Lidocaine 4% Topical Solution N/A Pain Control: Ambulance person N/A Tissue Debrided: Non-Viable Tissue Non-Viable Tissue N/A Level: 0.59 0.28 N/A Debridement A (sq cm): rea Curette Curette N/A Instrument: Minimum Minimum N/A Bleeding: Pressure Pressure N/A Hemostasis A chieved: 0 0 N/A Procedural Pain: 0 0 N/A Post Procedural Pain: Procedure was tolerated well Procedure was tolerated well N/A Debridement Treatment Response: 1.5x0.5x0.1 0.9x0.4x0.1 N/A Post Debridement Measurements L x W x D (cm) 0.059 0.028 N/A Post Debridement Volume: (cm) No Abnormalities Noted No Abnormalities Noted N/A Periwound Skin Texture: No Abnormalities Noted No Abnormalities Noted N/A Periwound Skin Moisture: No Abnormalities Noted Hemosiderin Staining: Yes N/A Periwound Skin Color: No Abnormality No Abnormality N/A Temperature: Yes N/A N/A Tenderness on Palpation: Debridement Debridement N/A Procedures Performed: Treatment Notes Electronic Signature(s) Signed: 03/31/2023 2:37:52 PM By: Erin Guess MD FACS Entered By: Erin [name] on 03/31/2023 14:37:52 -------------------------------------------------------------------------------- Multi-Disciplinary Care Plan Details Patient Name: Date of Service: [name], [name] 03/31/2023 2:00 PM Medical Record Number: [id number] Patient Account Number: [id number] Date of Birth/Sex: Treating RN: [birth year] (82 y.o. Erin [name] Primary Care Erin [name]: Erin [name] Other Clinician: CORNEISHA, [name] ([id number]) [id number] Page 4 of 9 Referring Erin [name]: Treating Erin [name]/Extender: Erin [name] in Treatment:  17 Multidisciplinary Care Plan reviewed with physician Active Inactive Necrotic Tissue Nursing Diagnoses: Impaired tissue integrity related to necrotic/devitalized tissue Knowledge deficit related to management of necrotic/devitalized tissue Goals: Necrotic/devitalized tissue will be minimized in the wound bed Date Initiated: 11/26/2022 Target Resolution Date: 05/06/2023 Goal Status: Active Patient/caregiver will verbalize understanding of reason and process for debridement of necrotic tissue Date Initiated: 11/26/2022 Date Inactivated: 01/20/2023 Target Resolution Date: 03/06/2023 Goal Status: Met Interventions: Assess patient pain level pre-, during and post procedure and prior to discharge Provide education on necrotic tissue and debridement process Treatment Activities: Apply topical anesthetic as ordered : 11/26/2022 Notes: Wound/Skin Impairment Nursing Diagnoses: Impaired tissue integrity Knowledge deficit related to ulceration/compromised skin integrity Goals: Patient/caregiver will verbalize understanding of skin care regimen Date Initiated: 11/26/2022 Target Resolution Date: 05/06/2023 Goal Status: Active Interventions: Assess ulceration(s) every visit Treatment Activities: Skin care regimen initiated : 11/26/2022 Topical wound management initiated : 11/26/2022 Notes: Electronic Signature(s) Signed: 03/31/2023 3:46:08 PM By: Erin Deed RN, BSN Entered By: Erin [name] on 03/31/2023 14:21:55 -------------------------------------------------------------------------------- Pain Assessment  Details Patient Name: Date of Service: [name], [name] RBA RA N. 03/31/2023 2:00 PM Medical Record Number: [id number] Patient Account Number: [id number] Date of Birth/Sex: Treating RN: Jan 05, 1941 (82 y.o. F) Primary Care Gavinn [name]: Erin [name] Other Clinician: Referring Erin [name]: Treating Erin [name]/Extender: Erin [name], Erin [name] in Treatment: 17 Active  Problems Location of Pain Severity and Description of Pain Patient Has Paino No Erin [name], Erin [name] ([id number]) [id number] Page 5 of 9 Patient Has Paino No Site Locations Rate the pain. Current Pain Level: 0 Pain Management and Medication Current Pain Management: Electronic Signature(s) Signed: 03/31/2023 3:46:08 PM By: Erin Deed RN, BSN Entered By: Erin [name] on 03/31/2023 14:12:04 -------------------------------------------------------------------------------- Patient/Caregiver Education Details Patient Name: Date of Service: [name], [name] 9/24/2024andnbsp2:00 PM Medical Record Number: [id number] Patient Account Number: [id number] Date of Birth/Gender: Treating RN: Nov 14, 1940 (82 y.o. Erin [name] Primary Care Physician: Erin [name] Other Clinician: Referring Physician: Treating Physician/Extender: Erin [name] in Treatment: 17 Education Assessment Education Provided To: Patient Education Topics Provided Venous: Methods: Explain/Verbal Responses: Reinforcements needed, State content correctly Wound/Skin Impairment: Methods: Explain/Verbal Responses: Reinforcements needed, State content correctly Electronic Signature(s) Signed: 03/31/2023 3:46:08 PM By: Erin Deed RN, BSN Entered By: Erin [name] on 03/31/2023 14:23:16 Erin [name] ([id number]) [id number] Page 6 of 9 -------------------------------------------------------------------------------- Wound Assessment Details Patient Name: Date of Service: [name], [name] 03/31/2023 2:00 PM Medical Record Number: [id number] Patient Account Number: [id number] Date of Birth/Sex: Treating RN: 1941/07/01 (82 y.o. 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, Gout, Osteoarthritis Clustered Wound: No Photos Wound Measurements Length: (cm) 1.5 Width: (cm) 0.5 Depth: (cm) 0.1 Area: (cm) 0.589 Volume: (cm) 0.059 % Reduction in Area: 90% % Reduction in Volume: 89.9% Epithelialization: Small (1-33%) Tunneling: No Undermining: No Wound Description Classification: Full Thickness Without Exposed Support Structures Wound Margin: Flat and Intact Exudate Amount: Medium Exudate Type: Serosanguineous Exudate Color: red, brown Foul Odor After Cleansing: No Slough/Fibrino Yes Wound Bed Granulation Amount: Large (67-100%) Exposed Structure Granulation Quality: Red Fascia Exposed: No Necrotic Amount: Small (1-33%) Fat Layer (Subcutaneous Tissue) Exposed: Yes Necrotic Quality: Eschar, Adherent Slough Tendon Exposed: No Muscle Exposed: No Joint Exposed: No Bone Exposed: No Periwound Skin Texture Texture Color No Abnormalities Noted: Yes No Abnormalities Noted: Yes Moisture Temperature / Pain No Abnormalities Noted: Yes Temperature: No Abnormality Tenderness on Palpation: Yes Treatment Notes Wound #1 (Lower Leg) Wound Laterality: Left, Medial Cleanser Soap and Water Discharge Instruction: May shower and wash wound with dial antibacterial soap and water prior to dressing change. Wound Cleanser Discharge Instruction: Cleanse the wound with wound cleanser prior to applying a clean dressing using gauze sponges, not tissue or cotton balls. Peri-Wound Care ALAJA, KANIS ([id number]) [id number] Page 7 of 9 Triamcinolone 15 (g) Discharge Instruction: Use triamcinolone 15 (g) as directed Sween Lotion (Moisturizing lotion) Discharge Instruction:  Apply moisturizing lotion as directed Topical Gentamicin Discharge Instruction: As directed by physician Mupirocin Ointment Discharge Instruction: Apply Mupirocin (Bactroban) as instructed Primary Dressing PolyMem Non-Adhesive Dressing, 4x4 in Discharge Instruction: Apply to wound bed as instructed Secondary Dressing Secured With Elastic Bandage 4 inch (ACE bandage) Discharge Instruction: Secure with

## 2023-04-07 ENCOUNTER — Encounter (HOSPITAL_BASED_OUTPATIENT_CLINIC_OR_DEPARTMENT_OTHER): Payer: Medicare HMO | Attending: General Surgery | Admitting: General Surgery

## 2023-04-07 DIAGNOSIS — L97222 Non-pressure chronic ulcer of left calf with fat layer exposed: Secondary | ICD-10-CM | POA: Insufficient documentation

## 2023-04-07 DIAGNOSIS — M1990 Unspecified osteoarthritis, unspecified site: Secondary | ICD-10-CM | POA: Diagnosis not present

## 2023-04-07 DIAGNOSIS — I87332 Chronic venous hypertension (idiopathic) with ulcer and inflammation of left lower extremity: Secondary | ICD-10-CM | POA: Diagnosis not present

## 2023-04-07 DIAGNOSIS — I872 Venous insufficiency (chronic) (peripheral): Secondary | ICD-10-CM | POA: Diagnosis not present

## 2023-04-07 DIAGNOSIS — Z9049 Acquired absence of other specified parts of digestive tract: Secondary | ICD-10-CM | POA: Insufficient documentation

## 2023-04-07 DIAGNOSIS — Z853 Personal history of malignant neoplasm of breast: Secondary | ICD-10-CM | POA: Diagnosis not present

## 2023-04-07 DIAGNOSIS — M109 Gout, unspecified: Secondary | ICD-10-CM | POA: Diagnosis not present

## 2023-04-07 DIAGNOSIS — L97822 Non-pressure chronic ulcer of other part of left lower leg with fat layer exposed: Secondary | ICD-10-CM | POA: Diagnosis not present

## 2023-04-07 DIAGNOSIS — K219 Gastro-esophageal reflux disease without esophagitis: Secondary | ICD-10-CM | POA: Diagnosis not present

## 2023-04-07 DIAGNOSIS — Z90721 Acquired absence of ovaries, unilateral: Secondary | ICD-10-CM | POA: Insufficient documentation

## 2023-04-07 DIAGNOSIS — N183 Chronic kidney disease, stage 3 unspecified: Secondary | ICD-10-CM | POA: Insufficient documentation

## 2023-04-07 DIAGNOSIS — E039 Hypothyroidism, unspecified: Secondary | ICD-10-CM | POA: Insufficient documentation

## 2023-04-07 DIAGNOSIS — I129 Hypertensive chronic kidney disease with stage 1 through stage 4 chronic kidney disease, or unspecified chronic kidney disease: Secondary | ICD-10-CM | POA: Insufficient documentation

## 2023-04-09 NOTE — Progress Notes (Signed)
Medical Record Number: [id number] Patient Account Number: [id number] Date of Birth/Sex: Treating RN: [birth year] (82 y.o. F) Primary Care Erin [name]: Erin [name] Other Clinician: Referring [name]: Treating [name]/Extender: [name], [name] in Treatment: 65 Active Problems Location of Pain Severity and Description of Pain Patient Has Paino No Site Locations [place], [place] ([id number]) [phone].pdf Page  5 of 9 Site Locations Rate the pain. Current Pain Level: 0 Pain Management and Medication Current Pain Management: Electronic Signature(s) Signed: 04/08/2023 5:18:43 PM By: Zenaida Deed RN, BSN Entered By: Zenaida Deed on 04/07/2023 10:57:55 -------------------------------------------------------------------------------- Patient/Caregiver Education Details Patient Name: Date of Service: [name], [name] 10/1/2024andnbsp2:00 PM Medical Record Number: [id number] Patient Account Number: [id number] Date of Birth/Gender: Treating RN: [birth year] (82 y.o. Erin [name] Primary Care Physician: Erin [name] Other Clinician: Referring Physician: Treating Physician/Extender: Erin [name] in Treatment: 63 Education Assessment Education Provided To: Patient Education Topics Provided Venous: Methods: Explain/Verbal Responses: Reinforcements needed, State content correctly Wound/Skin Impairment: Methods: Explain/Verbal Responses: Reinforcements needed, State content correctly Electronic Signature(s) Signed: 04/08/2023 5:18:43 PM By: Zenaida Deed RN, BSN Entered By: Zenaida Deed on 04/07/2023 11:05:24 Wound Assessment Details -------------------------------------------------------------------------------- Erin [name] ([id number]) [id number] Page 6 of 9 Patient Name: Date of Service: [name], [name] 04/07/2023 2:00 PM Medical Record Number: [id number] Patient Account Number: [id number] Date of Birth/Sex: Treating RN: 1941/06/22 (82 y.o. 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, Gout, Osteoarthritis Clustered Wound: No Photos Wound Measurements Length: (cm) 1.4 % Reduction in Area: 90.6% Width: (cm) 0.5 % Reduction in Volume: 90.6% Depth: (cm) 0.1 Epithelialization: Small (1-33%) Area: (cm) 0.55 Tunneling: No Volume: (cm) 0.055 Undermining: No Wound Description Classification: Full Thickness Without Exposed Support Structures Foul Odor After Cleansing: No Wound Margin: Flat and Intact Slough/Fibrino Yes Exudate Amount: Small Exudate Type: Serosanguineous Exudate Color: red, brown Wound Bed Granulation Amount: Small (1-33%) Exposed Structure Granulation Quality: Pink Fascia Exposed: No Necrotic Amount: Large (67-100%) Fat Layer (Subcutaneous Tissue) Exposed: Yes Necrotic Quality: Adherent Slough Tendon Exposed: No Muscle Exposed: No Joint Exposed: No Bone Exposed: No Periwound Skin Texture Texture Color No Abnormalities Noted: Yes No Abnormalities Noted: Yes Moisture Temperature / Pain No Abnormalities Noted: Yes Temperature: No Abnormality Tenderness on Palpation: Yes Treatment Notes Wound #1 (Lower Leg) Wound Laterality: Left, Medial Cleanser Soap and Water Discharge Instruction: May shower and wash wound with dial antibacterial soap and water prior to dressing change. Wound Cleanser Discharge Instruction: Cleanse the wound with wound cleanser prior to applying a clean dressing using gauze sponges, not tissue or cotton balls. Peri-Wound Care Triamcinolone 15 (g) Discharge Instruction: Use triamcinolone 15 (g) as directed SHAYLYNN, NULTY ([id number]) [phone].pdf Page 7 of 9 Sween Lotion (Moisturizing lotion) Discharge Instruction: Apply moisturizing lotion as directed Topical Gentamicin Discharge Instruction: As directed by physician Mupirocin Ointment Discharge Instruction: Apply Mupirocin  (Bactroban) as instructed Skintegrity Hydrogel 4 (oz) Discharge Instruction: Apply hydrogel as directed Primary Dressing Secondary Dressing Optifoam Non-Adhesive Dressing, 4x4 in Discharge Instruction: Apply over primary dressing as directed. Secured With Elastic Bandage 4 inch (ACE bandage) Discharge Instruction: Secure with ACE bandage as directed. Kerlix Roll Sterile,  SHERITTA, DEEG ([id number]) [id number] Page 1 of 9 Visit Report for 04/07/2023 Arrival Information Details Patient Name: Date of Service: [name], [name] RBA RA N. 04/07/2023 2:00 PM Medical Record Number: [id number] Patient Account Number: [id number] Date of Birth/Sex: Treating RN: 1940/10/24 (82 y.o. F) Primary Care Erin [name]: Erin [name] Other Clinician: Referring Erin [name]: Treating [name]/Extender: [name] Weeks in Treatment: 18 Visit Information History Since Last Visit Added or deleted any medications: No Patient Arrived: Ambulatory Any new allergies or adverse reactions: No Arrival Time: 13:43 Had a fall or experienced change in No Accompanied By: daughter activities of daily living that may affect Transfer Assistance: None risk of falls: Patient Identification Verified: Yes Signs or symptoms of abuse/neglect since last visito No Secondary Verification Process Completed: Yes Hospitalized since last visit: No Patient Requires Transmission-Based Precautions: No Implantable device outside of the clinic excluding No Patient Has Alerts: No cellular tissue based products placed in the center since last visit: Has Dressing in Place as Prescribed: Yes Has Compression in Place as Prescribed: Yes Pain Present Now: No Electronic Signature(s) Signed: 04/08/2023 5:18:43 PM By: Zenaida Deed RN, BSN Entered By: Zenaida Deed on 04/07/2023 10:57:43 -------------------------------------------------------------------------------- Encounter Discharge Information Details Patient Name: Date of Service: [name], [name] 04/07/2023 2:00 PM Medical Record Number: [id number] Patient Account Number: [id number] Date of Birth/Sex: Treating RN: 08-29-1940 (82 y.o. Erin [name] Primary Care Lakiesha Ralphs: Erin [name] Other Clinician: Referring Shantanique Hodo: Treating Reggie Welge/Extender: [name] Weeks in Treatment: 70 Encounter Discharge Information Items Post Procedure Vitals Discharge Condition: Stable Temperature (F): 98.2 Ambulatory Status: Ambulatory Pulse (bpm): 69 Discharge Destination: Home Respiratory Rate (breaths/min): 18 Transportation: Private Auto Blood Pressure (mmHg): 181/73 Accompanied By: daughter Schedule Follow-up Appointment: Yes Clinical Summary of Care: Patient Declined Electronic Signature(s) Signed: 04/08/2023 5:18:43 PM By: Zenaida Deed RN, BSN Entered By: Zenaida Deed on 04/07/2023 11:26:45 Erin [name] ([id number]) [id number] Page 2 of 9 -------------------------------------------------------------------------------- Lower Extremity Assessment Details Patient Name: Date of Service: [name], [name] 04/07/2023 2:00 PM Medical Record Number: [id number] Patient Account Number: [id number] Date of Birth/Sex: Treating RN: 04-Jul-1941 (82 y.o. Erin [name] Primary Care Arriyana Rodell: Erin [name] Other Clinician: Referring Rondey Fallen: Treating Crayton Savarese/Extender: [name], [name] in Treatment: 18 Edema Assessment Assessed: [Left: No] [Right: No] Edema: [Left: Ye] [Right: s] Calf Left: Right: Point of Measurement: From Medial Instep 27.5 cm Ankle Left: Right: Point of Measurement: From Medial Instep 18 cm Vascular Assessment Pulses: Dorsalis Pedis Palpable: [Left:Yes] Extremity colors, hair growth, and conditions: Extremity Color: [Left:Hyperpigmented] Hair Growth on Extremity: [Left:No] Temperature of Extremity: [Left:Warm < 3 seconds] Electronic Signature(s) Signed: 04/08/2023 5:18:43 PM By: Zenaida Deed RN, BSN Entered By: Zenaida Deed on 04/07/2023 10:59:20 -------------------------------------------------------------------------------- Multi Wound Chart Details Patient Name: Date of Service: [name], [name] 04/07/2023 2:00 PM Medical Record Number:  [id number] Patient Account Number: [id number] Date of Birth/Sex: Treating RN: [birth year] (82 y.o. F) Primary Care Nayely Dingus: Erin [name] Other Clinician: Referring Moe Brier: Treating Gizzelle Lacomb/Extender: [name], [name] in Treatment: 18 Vital Signs Height(in): 60 Pulse(bpm): 69 Weight(lbs): 138 Blood Pressure(mmHg): 181/73 Body Mass Index(BMI): 26.9 Temperature(F): 98.0 Respiratory Rate(breaths/min): 18 [1:Photos:] [N/A:N/A [id number] Page 3 of 9] Left, Medial Lower Leg Left, Proximal, Medial Lower Leg N/A Wound Location: Gradually Appeared Gradually Appeared N/A Wounding Event: Venous Leg Ulcer Venous Leg Ulcer N/A Primary Etiology: Cataracts, Arrhythmia, Hypertension, Cataracts, Arrhythmia, Hypertension, N/A  Medical Record Number: [id number] Patient Account Number: [id number] Date of Birth/Sex: Treating RN: [birth year] (82 y.o. F) Primary Care Erin [name]: Erin [name] Other Clinician: Referring [name]: Treating [name]/Extender: [name], [name] in Treatment: 65 Active Problems Location of Pain Severity and Description of Pain Patient Has Paino No Site Locations [place], [place] ([id number]) [phone].pdf Page  5 of 9 Site Locations Rate the pain. Current Pain Level: 0 Pain Management and Medication Current Pain Management: Electronic Signature(s) Signed: 04/08/2023 5:18:43 PM By: Zenaida Deed RN, BSN Entered By: Zenaida Deed on 04/07/2023 10:57:55 -------------------------------------------------------------------------------- Patient/Caregiver Education Details Patient Name: Date of Service: [name], [name] 10/1/2024andnbsp2:00 PM Medical Record Number: [id number] Patient Account Number: [id number] Date of Birth/Gender: Treating RN: [birth year] (82 y.o. Erin [name] Primary Care Physician: Erin [name] Other Clinician: Referring Physician: Treating Physician/Extender: Erin [name] in Treatment: 63 Education Assessment Education Provided To: Patient Education Topics Provided Venous: Methods: Explain/Verbal Responses: Reinforcements needed, State content correctly Wound/Skin Impairment: Methods: Explain/Verbal Responses: Reinforcements needed, State content correctly Electronic Signature(s) Signed: 04/08/2023 5:18:43 PM By: Zenaida Deed RN, BSN Entered By: Zenaida Deed on 04/07/2023 11:05:24 Wound Assessment Details -------------------------------------------------------------------------------- Erin [name] ([id number]) [id number] Page 6 of 9 Patient Name: Date of Service: [name], [name] 04/07/2023 2:00 PM Medical Record Number: [id number] Patient Account Number: [id number] Date of Birth/Sex: Treating RN: 1941/06/22 (82 y.o. 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, Gout, Osteoarthritis Clustered Wound: No Photos Wound Measurements Length: (cm) 1.4 % Reduction in Area: 90.6% Width: (cm) 0.5 % Reduction in Volume: 90.6% Depth: (cm) 0.1 Epithelialization: Small (1-33%) Area: (cm) 0.55 Tunneling: No Volume: (cm) 0.055 Undermining: No Wound Description Classification: Full Thickness Without Exposed Support Structures Foul Odor After Cleansing: No Wound Margin: Flat and Intact Slough/Fibrino Yes Exudate Amount: Small Exudate Type: Serosanguineous Exudate Color: red, brown Wound Bed Granulation Amount: Small (1-33%) Exposed Structure Granulation Quality: Pink Fascia Exposed: No Necrotic Amount: Large (67-100%) Fat Layer (Subcutaneous Tissue) Exposed: Yes Necrotic Quality: Adherent Slough Tendon Exposed: No Muscle Exposed: No Joint Exposed: No Bone Exposed: No Periwound Skin Texture Texture Color No Abnormalities Noted: Yes No Abnormalities Noted: Yes Moisture Temperature / Pain No Abnormalities Noted: Yes Temperature: No Abnormality Tenderness on Palpation: Yes Treatment Notes Wound #1 (Lower Leg) Wound Laterality: Left, Medial Cleanser Soap and Water Discharge Instruction: May shower and wash wound with dial antibacterial soap and water prior to dressing change. Wound Cleanser Discharge Instruction: Cleanse the wound with wound cleanser prior to applying a clean dressing using gauze sponges, not tissue or cotton balls. Peri-Wound Care Triamcinolone 15 (g) Discharge Instruction: Use triamcinolone 15 (g) as directed SHAYLYNN, NULTY ([id number]) [phone].pdf Page 7 of 9 Sween Lotion (Moisturizing lotion) Discharge Instruction: Apply moisturizing lotion as directed Topical Gentamicin Discharge Instruction: As directed by physician Mupirocin Ointment Discharge Instruction: Apply Mupirocin  (Bactroban) as instructed Skintegrity Hydrogel 4 (oz) Discharge Instruction: Apply hydrogel as directed Primary Dressing Secondary Dressing Optifoam Non-Adhesive Dressing, 4x4 in Discharge Instruction: Apply over primary dressing as directed. Secured With Elastic Bandage 4 inch (ACE bandage) Discharge Instruction: Secure with ACE bandage as directed. Kerlix Roll Sterile,  SHERITTA, DEEG ([id number]) [id number] Page 1 of 9 Visit Report for 04/07/2023 Arrival Information Details Patient Name: Date of Service: [name], [name] RBA RA N. 04/07/2023 2:00 PM Medical Record Number: [id number] Patient Account Number: [id number] Date of Birth/Sex: Treating RN: 1940/10/24 (82 y.o. F) Primary Care Erin [name]: Erin [name] Other Clinician: Referring Erin [name]: Treating [name]/Extender: [name] Weeks in Treatment: 18 Visit Information History Since Last Visit Added or deleted any medications: No Patient Arrived: Ambulatory Any new allergies or adverse reactions: No Arrival Time: 13:43 Had a fall or experienced change in No Accompanied By: daughter activities of daily living that may affect Transfer Assistance: None risk of falls: Patient Identification Verified: Yes Signs or symptoms of abuse/neglect since last visito No Secondary Verification Process Completed: Yes Hospitalized since last visit: No Patient Requires Transmission-Based Precautions: No Implantable device outside of the clinic excluding No Patient Has Alerts: No cellular tissue based products placed in the center since last visit: Has Dressing in Place as Prescribed: Yes Has Compression in Place as Prescribed: Yes Pain Present Now: No Electronic Signature(s) Signed: 04/08/2023 5:18:43 PM By: Zenaida Deed RN, BSN Entered By: Zenaida Deed on 04/07/2023 10:57:43 -------------------------------------------------------------------------------- Encounter Discharge Information Details Patient Name: Date of Service: [name], [name] 04/07/2023 2:00 PM Medical Record Number: [id number] Patient Account Number: [id number] Date of Birth/Sex: Treating RN: 08-29-1940 (82 y.o. Erin [name] Primary Care Lakiesha Ralphs: Erin [name] Other Clinician: Referring Shantanique Hodo: Treating Reggie Welge/Extender: [name] Weeks in Treatment: 70 Encounter Discharge Information Items Post Procedure Vitals Discharge Condition: Stable Temperature (F): 98.2 Ambulatory Status: Ambulatory Pulse (bpm): 69 Discharge Destination: Home Respiratory Rate (breaths/min): 18 Transportation: Private Auto Blood Pressure (mmHg): 181/73 Accompanied By: daughter Schedule Follow-up Appointment: Yes Clinical Summary of Care: Patient Declined Electronic Signature(s) Signed: 04/08/2023 5:18:43 PM By: Zenaida Deed RN, BSN Entered By: Zenaida Deed on 04/07/2023 11:26:45 Erin [name] ([id number]) [id number] Page 2 of 9 -------------------------------------------------------------------------------- Lower Extremity Assessment Details Patient Name: Date of Service: [name], [name] 04/07/2023 2:00 PM Medical Record Number: [id number] Patient Account Number: [id number] Date of Birth/Sex: Treating RN: 04-Jul-1941 (82 y.o. Erin [name] Primary Care Arriyana Rodell: Erin [name] Other Clinician: Referring Rondey Fallen: Treating Crayton Savarese/Extender: [name], [name] in Treatment: 18 Edema Assessment Assessed: [Left: No] [Right: No] Edema: [Left: Ye] [Right: s] Calf Left: Right: Point of Measurement: From Medial Instep 27.5 cm Ankle Left: Right: Point of Measurement: From Medial Instep 18 cm Vascular Assessment Pulses: Dorsalis Pedis Palpable: [Left:Yes] Extremity colors, hair growth, and conditions: Extremity Color: [Left:Hyperpigmented] Hair Growth on Extremity: [Left:No] Temperature of Extremity: [Left:Warm < 3 seconds] Electronic Signature(s) Signed: 04/08/2023 5:18:43 PM By: Zenaida Deed RN, BSN Entered By: Zenaida Deed on 04/07/2023 10:59:20 -------------------------------------------------------------------------------- Multi Wound Chart Details Patient Name: Date of Service: [name], [name] 04/07/2023 2:00 PM Medical Record Number:  [id number] Patient Account Number: [id number] Date of Birth/Sex: Treating RN: [birth year] (82 y.o. F) Primary Care Nayely Dingus: Erin [name] Other Clinician: Referring Moe Brier: Treating Gizzelle Lacomb/Extender: [name], [name] in Treatment: 18 Vital Signs Height(in): 60 Pulse(bpm): 69 Weight(lbs): 138 Blood Pressure(mmHg): 181/73 Body Mass Index(BMI): 26.9 Temperature(F): 98.0 Respiratory Rate(breaths/min): 18 [1:Photos:] [N/A:N/A [id number].pdf Page 3 of 9] Left, Medial Lower Leg Left, Proximal, Medial Lower Leg N/A Wound Location: Gradually Appeared Gradually Appeared N/A Wounding Event: Venous Leg Ulcer Venous Leg Ulcer N/A Primary Etiology: Cataracts, Arrhythmia, Hypertension, Cataracts, Arrhythmia, Hypertension, N/A

## 2023-04-09 NOTE — Progress Notes (Signed)
Wound Cleanser 1 x Per Week/30 Days Discharge Instructions: Cleanse the wound with wound cleanser prior to applying Erin clean dressing using gauze sponges, not tissue or cotton balls. Peri-Wound Care: Triamcinolone 15 (g) 1 x Per Week/30 Days Discharge Instructions: Use triamcinolone 15 (g) as directed Peri-Wound Care: Sween Lotion (Moisturizing lotion) 1 x Per Week/30 Days Discharge Instructions: Apply moisturizing lotion as directed Topical: Gentamicin 1 x Per Week/30 Days Discharge Instructions: As directed by physician Topical: Mupirocin Ointment 1 x Per Week/30 Days Discharge Instructions: Apply Mupirocin (Bactroban) as instructed Topical: Skintegrity Hydrogel 4 (oz) 1 x Per Week/30 Days Discharge Instructions: Apply hydrogel as directed Secondary Dressing: Optifoam Non-Adhesive Dressing, 4x4 in 1 x Per Week/30 Days Discharge Instructions: Apply over primary dressing as directed. Secured With: Elastic Bandage 4 inch (ACE bandage) 1 x Per Week/30 Days Discharge Instructions: Secure with ACE bandage as directed. Secured With: [employer], 4.5x3.1 (in/yd) 1 x Per Week/30 Days Discharge Instructions: Secure with Kerlix as directed. WOUND #10: - Lower Leg Wound Laterality: Left, Medial, Proximal Cleanser: Soap and Water 1  x Per Week/30 Days Discharge Instructions: May shower and wash wound with dial antibacterial soap and water prior to dressing change. Cleanser: Wound Cleanser 1 x Per Week/30 Days Discharge Instructions: Cleanse the wound with wound cleanser prior to applying Erin clean dressing using gauze sponges, not tissue or cotton balls. Peri-Wound Care: Triamcinolone 15 (g) 1 x Per Week/30 Days Discharge Instructions: Use triamcinolone 15 (g) as directed Peri-Wound Care: Sween Lotion (Moisturizing lotion) 1 x Per Week/30 Days Discharge Instructions: Apply moisturizing lotion as directed Topical: Gentamicin 1 x Per Week/30 Days Discharge Instructions: As directed by physician Topical: Mupirocin Ointment 1 x Per Week/30 Days Discharge Instructions: Apply Mupirocin (Bactroban) as instructed Topical: Skintegrity Hydrogel 4 (oz) 1 x Per Week/30 Days Discharge Instructions: Apply hydrogel as directed Prim Dressing: Promogran Prisma Matrix, 4.34 (sq in) (silver collagen) 1 x Per Week/30 Days ary Discharge Instructions: Moisten collagen with saline or hydrogel Secondary Dressing: Optifoam Non-Adhesive Dressing, 4x4 in 1 x Per Week/30 Days Discharge Instructions: Apply over primary dressing as directed. Secured With: Elastic Bandage 4 inch (ACE bandage) 1 x Per Week/30 Days Discharge Instructions: Secure with ACE bandage as directed. Secured With: [employer], 4.5x3.1 (in/yd) 1 x Per Week/30 Days Discharge Instructions: Secure with Kerlix as directed. 04/07/2023: The wound that was new last week has nearly closed, but remains open by perhaps about Erin millimeter. The wound closer to her ankle had no significant change based on measurements, but visually it looks smaller to me. It is Erin little bit dry today and the surface is still fibrotic. The more proximal wound did not require any debridement. I debrided slough from the more distal wound. We will continue with topical gentamicin and mupirocin, but I want  to go back to the Prisma silver collagen with Optifoam to retain better moisture. Continue Kerlix and Coban wrap. Follow-up in 1 week. Electronic Signature(s) Signed: 04/07/2023 2:18:42 PM By: Erin Guess MD FACS Entered By: Erin [name] on 04/07/2023 11:18:41 HxROS Details -------------------------------------------------------------------------------- Erin [name] ([id number]) [id number] Page 9 of 10 Patient Name: Date of Service: [name], [name] 04/07/2023 2:00 PM Medical Record Number: [id number] Patient Account Number: [id number] Date of Birth/Sex: Treating RN: May 02, 1941 (82 y.o. F) Primary Care Provider: Hoyle [name] Other Clinician: Referring Provider: Treating Provider/Extender: Erin [name], Erin [name] in Treatment: 69 Eyes Medical History: Positive for: Cataracts Hematologic/Lymphatic Medical History: Past Medical History Notes:  There is still Erin fairly fibrotic surface underneath and slough. Edema remains well-controlled. 03/31/2023: She has Erin new wound that was identified when her wraps and dressings were removed today. It is just to the midline of the anterior tibial surface. The fat layer is exposed. There is Erin little bit of eschar and dry skin along with some thin slough. The wound closer to her ankle measured smaller today. The surface continues to improve although portions of it still remain fibrotic. There is slough buildup present. 04/07/2023: The wound that was new last week has nearly closed, but remains open by perhaps about Erin millimeter. The wound closer to her ankle had no significant change based on measurements, but visually it looks smaller to me. It is Erin little bit dry today and the surface is still fibrotic. Electronic Signature(s) Signed: 04/07/2023 2:15:38 PM By: Erin Guess MD FACS Entered By: Erin [name] on 04/07/2023 11:15:38 -------------------------------------------------------------------------------- Physical Exam Details Patient Name: Date of Service: [name], [name] 04/07/2023 2:00 PM Medical Record Number: [id number] Patient Account Number: [id number] Date of Birth/Sex: Treating RN: March 01, 1941 (82 y.o. F) Primary Care Provider: Hoyle [name] Other Clinician: Referring Provider: Treating Provider/Extender: Erin [name], Erin [name] in Treatment: 68 Constitutional Hypertensive, asymptomatic. . . . no acute distress. Respiratory Normal work of breathing on room air. Notes 04/07/2023: The wound that was new last week has nearly closed, but remains open by perhaps about Erin millimeter. The wound closer to her ankle had no significant change  based on measurements, but visually it looks smaller to me. It is Erin little bit dry today and the surface is still fibrotic. Electronic Signature(s) Signed: 04/07/2023 2:17:19 PM By: Erin Guess MD FACS Entered By: Erin [name] on 04/07/2023 11:17:19 -------------------------------------------------------------------------------- Physician Orders Details Patient Name: Date of Service: [name], [name] 04/07/2023 2:00 PM Medical Record Number: [id number] Patient Account Number: [id number] Date of Birth/Sex: Treating RN: 03-10-1941 (82 y.o. [name] Primary Care Provider: Hoyle [name] Other Clinician: Referring Provider: Treating Provider/Extender: [name] [name] in Treatment: 35 Verbal / Phone Orders: No Diagnosis Coding ICD-10 Coding Code Description [phone] Non-pressure chronic ulcer of other part of left lower leg with fat layer exposed I87.332 Chronic venous hypertension (idiopathic) with ulcer and inflammation of left lower extremity I10 Essential (primary) hypertension Follow-up Appointments ppointment in 1 week. - Dr. Lady Gary - room 1 Return Erin [name], Erin [name] ([id number]) [phone].pdf Page 4 of 10 Tuesday 10/8 @ 2:00 pm Anesthetic (In clinic) Topical Lidocaine 4% applied to wound bed Bathing/ Shower/ Hygiene May shower with protection but do not get wound dressing(s) wet. Protect dressing(s) with water repellant cover (for example, large plastic bag) or Erin cast cover and may then take shower. Edema Control - Lymphedema / SCD / Other Elevate legs to the level of the heart or above for 30 minutes daily and/or when sitting for 3-4 times Erin day throughout the day. Avoid standing for long periods of time. Exercise regularly Wound Treatment Wound #1 - Lower Leg Wound Laterality: Left, Medial Cleanser: Soap and Water 1 x Per Week/30 Days Discharge Instructions: May shower and wash wound with dial  antibacterial soap and water prior to dressing change. Cleanser: Wound Cleanser 1 x Per Week/30 Days Discharge Instructions: Cleanse the wound with wound cleanser prior to applying Erin clean dressing using gauze sponges, not tissue or cotton balls. Peri-Wound Care: Triamcinolone 15 (g) 1 x Per Week/30 Days Discharge Instructions: Use  Wound Cleanser 1 x Per Week/30 Days Discharge Instructions: Cleanse the wound with wound cleanser prior to applying Erin clean dressing using gauze sponges, not tissue or cotton balls. Peri-Wound Care: Triamcinolone 15 (g) 1 x Per Week/30 Days Discharge Instructions: Use triamcinolone 15 (g) as directed Peri-Wound Care: Sween Lotion (Moisturizing lotion) 1 x Per Week/30 Days Discharge Instructions: Apply moisturizing lotion as directed Topical: Gentamicin 1 x Per Week/30 Days Discharge Instructions: As directed by physician Topical: Mupirocin Ointment 1 x Per Week/30 Days Discharge Instructions: Apply Mupirocin (Bactroban) as instructed Topical: Skintegrity Hydrogel 4 (oz) 1 x Per Week/30 Days Discharge Instructions: Apply hydrogel as directed Secondary Dressing: Optifoam Non-Adhesive Dressing, 4x4 in 1 x Per Week/30 Days Discharge Instructions: Apply over primary dressing as directed. Secured With: Elastic Bandage 4 inch (ACE bandage) 1 x Per Week/30 Days Discharge Instructions: Secure with ACE bandage as directed. Secured With: [employer], 4.5x3.1 (in/yd) 1 x Per Week/30 Days Discharge Instructions: Secure with Kerlix as directed. WOUND #10: - Lower Leg Wound Laterality: Left, Medial, Proximal Cleanser: Soap and Water 1  x Per Week/30 Days Discharge Instructions: May shower and wash wound with dial antibacterial soap and water prior to dressing change. Cleanser: Wound Cleanser 1 x Per Week/30 Days Discharge Instructions: Cleanse the wound with wound cleanser prior to applying Erin clean dressing using gauze sponges, not tissue or cotton balls. Peri-Wound Care: Triamcinolone 15 (g) 1 x Per Week/30 Days Discharge Instructions: Use triamcinolone 15 (g) as directed Peri-Wound Care: Sween Lotion (Moisturizing lotion) 1 x Per Week/30 Days Discharge Instructions: Apply moisturizing lotion as directed Topical: Gentamicin 1 x Per Week/30 Days Discharge Instructions: As directed by physician Topical: Mupirocin Ointment 1 x Per Week/30 Days Discharge Instructions: Apply Mupirocin (Bactroban) as instructed Topical: Skintegrity Hydrogel 4 (oz) 1 x Per Week/30 Days Discharge Instructions: Apply hydrogel as directed Prim Dressing: Promogran Prisma Matrix, 4.34 (sq in) (silver collagen) 1 x Per Week/30 Days ary Discharge Instructions: Moisten collagen with saline or hydrogel Secondary Dressing: Optifoam Non-Adhesive Dressing, 4x4 in 1 x Per Week/30 Days Discharge Instructions: Apply over primary dressing as directed. Secured With: Elastic Bandage 4 inch (ACE bandage) 1 x Per Week/30 Days Discharge Instructions: Secure with ACE bandage as directed. Secured With: [employer], 4.5x3.1 (in/yd) 1 x Per Week/30 Days Discharge Instructions: Secure with Kerlix as directed. 04/07/2023: The wound that was new last week has nearly closed, but remains open by perhaps about Erin millimeter. The wound closer to her ankle had no significant change based on measurements, but visually it looks smaller to me. It is Erin little bit dry today and the surface is still fibrotic. The more proximal wound did not require any debridement. I debrided slough from the more distal wound. We will continue with topical gentamicin and mupirocin, but I want  to go back to the Prisma silver collagen with Optifoam to retain better moisture. Continue Kerlix and Coban wrap. Follow-up in 1 week. Electronic Signature(s) Signed: 04/07/2023 2:18:42 PM By: Erin Guess MD FACS Entered By: Erin [name] on 04/07/2023 11:18:41 HxROS Details -------------------------------------------------------------------------------- Erin [name] ([id number]) [id number] Page 9 of 10 Patient Name: Date of Service: [name], [name] 04/07/2023 2:00 PM Medical Record Number: [id number] Patient Account Number: [id number] Date of Birth/Sex: Treating RN: May 02, 1941 (82 y.o. F) Primary Care Provider: Hoyle [name] Other Clinician: Referring Provider: Treating Provider/Extender: Erin [name], Erin [name] in Treatment: 69 Eyes Medical History: Positive for: Cataracts Hematologic/Lymphatic Medical History: Past Medical History Notes:  There is still Erin fairly fibrotic surface underneath and slough. Edema remains well-controlled. 03/31/2023: She has Erin new wound that was identified when her wraps and dressings were removed today. It is just to the midline of the anterior tibial surface. The fat layer is exposed. There is Erin little bit of eschar and dry skin along with some thin slough. The wound closer to her ankle measured smaller today. The surface continues to improve although portions of it still remain fibrotic. There is slough buildup present. 04/07/2023: The wound that was new last week has nearly closed, but remains open by perhaps about Erin millimeter. The wound closer to her ankle had no significant change based on measurements, but visually it looks smaller to me. It is Erin little bit dry today and the surface is still fibrotic. Electronic Signature(s) Signed: 04/07/2023 2:15:38 PM By: Erin Guess MD FACS Entered By: Erin [name] on 04/07/2023 11:15:38 -------------------------------------------------------------------------------- Physical Exam Details Patient Name: Date of Service: [name], [name] 04/07/2023 2:00 PM Medical Record Number: [id number] Patient Account Number: [id number] Date of Birth/Sex: Treating RN: March 01, 1941 (82 y.o. F) Primary Care Provider: Hoyle [name] Other Clinician: Referring Provider: Treating Provider/Extender: Erin [name], Erin [name] in Treatment: 68 Constitutional Hypertensive, asymptomatic. . . . no acute distress. Respiratory Normal work of breathing on room air. Notes 04/07/2023: The wound that was new last week has nearly closed, but remains open by perhaps about Erin millimeter. The wound closer to her ankle had no significant change  based on measurements, but visually it looks smaller to me. It is Erin little bit dry today and the surface is still fibrotic. Electronic Signature(s) Signed: 04/07/2023 2:17:19 PM By: Erin Guess MD FACS Entered By: Erin [name] on 04/07/2023 11:17:19 -------------------------------------------------------------------------------- Physician Orders Details Patient Name: Date of Service: [name], [name] 04/07/2023 2:00 PM Medical Record Number: [id number] Patient Account Number: [id number] Date of Birth/Sex: Treating RN: 03-10-1941 (82 y.o. [name] Primary Care Provider: Hoyle [name] Other Clinician: Referring Provider: Treating Provider/Extender: [name] [name] in Treatment: 35 Verbal / Phone Orders: No Diagnosis Coding ICD-10 Coding Code Description [phone] Non-pressure chronic ulcer of other part of left lower leg with fat layer exposed I87.332 Chronic venous hypertension (idiopathic) with ulcer and inflammation of left lower extremity I10 Essential (primary) hypertension Follow-up Appointments ppointment in 1 week. - Dr. Lady Gary - room 1 Return Erin [name], Erin [name] ([id number]) [phone].pdf Page 4 of 10 Tuesday 10/8 @ 2:00 pm Anesthetic (In clinic) Topical Lidocaine 4% applied to wound bed Bathing/ Shower/ Hygiene May shower with protection but do not get wound dressing(s) wet. Protect dressing(s) with water repellant cover (for example, large plastic bag) or Erin cast cover and may then take shower. Edema Control - Lymphedema / SCD / Other Elevate legs to the level of the heart or above for 30 minutes daily and/or when sitting for 3-4 times Erin day throughout the day. Avoid standing for long periods of time. Exercise regularly Wound Treatment Wound #1 - Lower Leg Wound Laterality: Left, Medial Cleanser: Soap and Water 1 x Per Week/30 Days Discharge Instructions: May shower and wash wound with dial  antibacterial soap and water prior to dressing change. Cleanser: Wound Cleanser 1 x Per Week/30 Days Discharge Instructions: Cleanse the wound with wound cleanser prior to applying Erin clean dressing using gauze sponges, not tissue or cotton balls. Peri-Wound Care: Triamcinolone 15 (g) 1 x Per Week/30 Days Discharge Instructions: Use  thrombocytopenia Cardiovascular Medical History: Positive for: Arrhythmia - bundle branch block; Hypertension; Peripheral Venous Disease Gastrointestinal Medical History: Past Medical History Notes: diverticulosis, GERD Endocrine Medical History: Past Medical History Notes: hypothyroidism Genitourinary Medical History: Past Medical History Notes: CKD stage III Musculoskeletal Medical History: Positive for: Gout; Osteoarthritis Past Medical History Notes: DJD, osteoporosis Oncologic Medical History: Past Medical History Notes: breast cancer HBO Extended History Items Eyes: Cataracts Immunizations Pneumococcal Vaccine: Received Pneumococcal Vaccination: Yes Received Pneumococcal Vaccination On or After 60th Birthday: No Implantable Devices No devices added Hospitalization / Surgery History Type of Hospitalization/Surgery Salpingoophorectomy Colon resection Appendectomy Cystoscopy w/ retrogrades Joint replacement Mastectomy right gastric ulcer  repair Abdominal hysterectomy Erin [name], Erin [name] ([id number]) [id number] Page 10 of 10 Family and Social History Cancer: Yes - Siblings; Diabetes: No; Heart Disease: No; Hereditary Spherocytosis: No; Hypertension: No; Kidney Disease: No; Lung Disease: Yes - Siblings; Seizures: No; Stroke: No; Thyroid Problems: No; Tuberculosis: No; Never smoker; Marital Status - Widowed; Alcohol Use: Never; Drug Use: No History; Caffeine Use: Moderate; Financial Concerns: No; Food, Clothing or Shelter Needs: No; Support System Lacking: No; Transportation Concerns: No Psychologist, prison and probation services) Signed: 04/07/2023 2:27:11 PM By: Erin Guess MD FACS Entered By: Erin [name] on 04/07/2023 11:15:49 -------------------------------------------------------------------------------- SuperBill Details Patient Name: Date of Service: [name], [name] 04/07/2023 Medical Record Number: [id number] Patient Account Number: [id number] Date of Birth/Sex: Treating RN: 02/10/1941 (82 y.o. 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 I10 Essential (primary) hypertension Facility Procedures : CPT4 Code: [id number] Description: 97597 - DEBRIDE WOUND 1ST 20 SQ CM OR < ICD-10 Diagnosis Description L97.822 Non-pressure chronic ulcer of other part of left lower leg with fat layer exposed Modifier: Quantity: 1 Physician Procedures : CPT4 Code Description Modifier [id number] - WC PHYS LEVEL 4 - EST PT 25 ICD-10 Diagnosis Description L97.822 Non-pressure chronic ulcer of other part of left lower leg with fat layer exposed I87.332 Chronic  venous hypertension (idiopathic) with  ulcer and inflammation of left lower extremity I10 Essential (primary) hypertension Quantity: 1 : [id number] 97597 - WC PHYS DEBR WO ANESTH 20 SQ CM ICD-10 Diagnosis Description L97.822 Non-pressure chronic ulcer of other part of left lower leg with fat layer exposed Quantity: 1 Electronic Signature(s) Signed: 04/07/2023 2:21:16 PM By: Erin Guess MD FACS Entered By: Erin [name] on 04/07/2023 11:21:15  Wound Cleanser 1 x Per Week/30 Days Discharge Instructions: Cleanse the wound with wound cleanser prior to applying Erin clean dressing using gauze sponges, not tissue or cotton balls. Peri-Wound Care: Triamcinolone 15 (g) 1 x Per Week/30 Days Discharge Instructions: Use triamcinolone 15 (g) as directed Peri-Wound Care: Sween Lotion (Moisturizing lotion) 1 x Per Week/30 Days Discharge Instructions: Apply moisturizing lotion as directed Topical: Gentamicin 1 x Per Week/30 Days Discharge Instructions: As directed by physician Topical: Mupirocin Ointment 1 x Per Week/30 Days Discharge Instructions: Apply Mupirocin (Bactroban) as instructed Topical: Skintegrity Hydrogel 4 (oz) 1 x Per Week/30 Days Discharge Instructions: Apply hydrogel as directed Secondary Dressing: Optifoam Non-Adhesive Dressing, 4x4 in 1 x Per Week/30 Days Discharge Instructions: Apply over primary dressing as directed. Secured With: Elastic Bandage 4 inch (ACE bandage) 1 x Per Week/30 Days Discharge Instructions: Secure with ACE bandage as directed. Secured With: [employer], 4.5x3.1 (in/yd) 1 x Per Week/30 Days Discharge Instructions: Secure with Kerlix as directed. WOUND #10: - Lower Leg Wound Laterality: Left, Medial, Proximal Cleanser: Soap and Water 1  x Per Week/30 Days Discharge Instructions: May shower and wash wound with dial antibacterial soap and water prior to dressing change. Cleanser: Wound Cleanser 1 x Per Week/30 Days Discharge Instructions: Cleanse the wound with wound cleanser prior to applying Erin clean dressing using gauze sponges, not tissue or cotton balls. Peri-Wound Care: Triamcinolone 15 (g) 1 x Per Week/30 Days Discharge Instructions: Use triamcinolone 15 (g) as directed Peri-Wound Care: Sween Lotion (Moisturizing lotion) 1 x Per Week/30 Days Discharge Instructions: Apply moisturizing lotion as directed Topical: Gentamicin 1 x Per Week/30 Days Discharge Instructions: As directed by physician Topical: Mupirocin Ointment 1 x Per Week/30 Days Discharge Instructions: Apply Mupirocin (Bactroban) as instructed Topical: Skintegrity Hydrogel 4 (oz) 1 x Per Week/30 Days Discharge Instructions: Apply hydrogel as directed Prim Dressing: Promogran Prisma Matrix, 4.34 (sq in) (silver collagen) 1 x Per Week/30 Days ary Discharge Instructions: Moisten collagen with saline or hydrogel Secondary Dressing: Optifoam Non-Adhesive Dressing, 4x4 in 1 x Per Week/30 Days Discharge Instructions: Apply over primary dressing as directed. Secured With: Elastic Bandage 4 inch (ACE bandage) 1 x Per Week/30 Days Discharge Instructions: Secure with ACE bandage as directed. Secured With: [employer], 4.5x3.1 (in/yd) 1 x Per Week/30 Days Discharge Instructions: Secure with Kerlix as directed. 04/07/2023: The wound that was new last week has nearly closed, but remains open by perhaps about Erin millimeter. The wound closer to her ankle had no significant change based on measurements, but visually it looks smaller to me. It is Erin little bit dry today and the surface is still fibrotic. The more proximal wound did not require any debridement. I debrided slough from the more distal wound. We will continue with topical gentamicin and mupirocin, but I want  to go back to the Prisma silver collagen with Optifoam to retain better moisture. Continue Kerlix and Coban wrap. Follow-up in 1 week. Electronic Signature(s) Signed: 04/07/2023 2:18:42 PM By: Erin Guess MD FACS Entered By: Erin [name] on 04/07/2023 11:18:41 HxROS Details -------------------------------------------------------------------------------- Erin [name] ([id number]) [id number] Page 9 of 10 Patient Name: Date of Service: [name], [name] 04/07/2023 2:00 PM Medical Record Number: [id number] Patient Account Number: [id number] Date of Birth/Sex: Treating RN: May 02, 1941 (82 y.o. F) Primary Care Provider: Hoyle [name] Other Clinician: Referring Provider: Treating Provider/Extender: Erin [name], Erin [name] in Treatment: 69 Eyes Medical History: Positive for: Cataracts Hematologic/Lymphatic Medical History: Past Medical History Notes:  thrombocytopenia Cardiovascular Medical History: Positive for: Arrhythmia - bundle branch block; Hypertension; Peripheral Venous Disease Gastrointestinal Medical History: Past Medical History Notes: diverticulosis, GERD Endocrine Medical History: Past Medical History Notes: hypothyroidism Genitourinary Medical History: Past Medical History Notes: CKD stage III Musculoskeletal Medical History: Positive for: Gout; Osteoarthritis Past Medical History Notes: DJD, osteoporosis Oncologic Medical History: Past Medical History Notes: breast cancer HBO Extended History Items Eyes: Cataracts Immunizations Pneumococcal Vaccine: Received Pneumococcal Vaccination: Yes Received Pneumococcal Vaccination On or After 60th Birthday: No Implantable Devices No devices added Hospitalization / Surgery History Type of Hospitalization/Surgery Salpingoophorectomy Colon resection Appendectomy Cystoscopy w/ retrogrades Joint replacement Mastectomy right gastric ulcer  repair Abdominal hysterectomy Erin [name], Erin [name] ([id number]) [id number] Page 10 of 10 Family and Social History Cancer: Yes - Siblings; Diabetes: No; Heart Disease: No; Hereditary Spherocytosis: No; Hypertension: No; Kidney Disease: No; Lung Disease: Yes - Siblings; Seizures: No; Stroke: No; Thyroid Problems: No; Tuberculosis: No; Never smoker; Marital Status - Widowed; Alcohol Use: Never; Drug Use: No History; Caffeine Use: Moderate; Financial Concerns: No; Food, Clothing or Shelter Needs: No; Support System Lacking: No; Transportation Concerns: No Psychologist, prison and probation services) Signed: 04/07/2023 2:27:11 PM By: Erin Guess MD FACS Entered By: Erin [name] on 04/07/2023 11:15:49 -------------------------------------------------------------------------------- SuperBill Details Patient Name: Date of Service: [name], [name] 04/07/2023 Medical Record Number: [id number] Patient Account Number: [id number] Date of Birth/Sex: Treating RN: 02/10/1941 (82 y.o. 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 I10 Essential (primary) hypertension Facility Procedures : CPT4 Code: [id number] Description: 97597 - DEBRIDE WOUND 1ST 20 SQ CM OR < ICD-10 Diagnosis Description L97.822 Non-pressure chronic ulcer of other part of left lower leg with fat layer exposed Modifier: Quantity: 1 Physician Procedures : CPT4 Code Description Modifier [id number] - WC PHYS LEVEL 4 - EST PT 25 ICD-10 Diagnosis Description L97.822 Non-pressure chronic ulcer of other part of left lower leg with fat layer exposed I87.332 Chronic  venous hypertension (idiopathic) with  ulcer and inflammation of left lower extremity I10 Essential (primary) hypertension Quantity: 1 : [id number] 97597 - WC PHYS DEBR WO ANESTH 20 SQ CM ICD-10 Diagnosis Description L97.822 Non-pressure chronic ulcer of other part of left lower leg with fat layer exposed Quantity: 1 Electronic Signature(s) Signed: 04/07/2023 2:21:16 PM By: Erin Guess MD FACS Entered By: Erin [name] on 04/07/2023 11:21:15  thrombocytopenia Cardiovascular Medical History: Positive for: Arrhythmia - bundle branch block; Hypertension; Peripheral Venous Disease Gastrointestinal Medical History: Past Medical History Notes: diverticulosis, GERD Endocrine Medical History: Past Medical History Notes: hypothyroidism Genitourinary Medical History: Past Medical History Notes: CKD stage III Musculoskeletal Medical History: Positive for: Gout; Osteoarthritis Past Medical History Notes: DJD, osteoporosis Oncologic Medical History: Past Medical History Notes: breast cancer HBO Extended History Items Eyes: Cataracts Immunizations Pneumococcal Vaccine: Received Pneumococcal Vaccination: Yes Received Pneumococcal Vaccination On or After 60th Birthday: No Implantable Devices No devices added Hospitalization / Surgery History Type of Hospitalization/Surgery Salpingoophorectomy Colon resection Appendectomy Cystoscopy w/ retrogrades Joint replacement Mastectomy right gastric ulcer  repair Abdominal hysterectomy Erin [name], Erin [name] ([id number]) [id number] Page 10 of 10 Family and Social History Cancer: Yes - Siblings; Diabetes: No; Heart Disease: No; Hereditary Spherocytosis: No; Hypertension: No; Kidney Disease: No; Lung Disease: Yes - Siblings; Seizures: No; Stroke: No; Thyroid Problems: No; Tuberculosis: No; Never smoker; Marital Status - Widowed; Alcohol Use: Never; Drug Use: No History; Caffeine Use: Moderate; Financial Concerns: No; Food, Clothing or Shelter Needs: No; Support System Lacking: No; Transportation Concerns: No Psychologist, prison and probation services) Signed: 04/07/2023 2:27:11 PM By: Erin Guess MD FACS Entered By: Erin [name] on 04/07/2023 11:15:49 -------------------------------------------------------------------------------- SuperBill Details Patient Name: Date of Service: [name], [name] 04/07/2023 Medical Record Number: [id number] Patient Account Number: [id number] Date of Birth/Sex: Treating RN: 02/10/1941 (82 y.o. 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 I10 Essential (primary) hypertension Facility Procedures : CPT4 Code: [id number] Description: 97597 - DEBRIDE WOUND 1ST 20 SQ CM OR < ICD-10 Diagnosis Description L97.822 Non-pressure chronic ulcer of other part of left lower leg with fat layer exposed Modifier: Quantity: 1 Physician Procedures : CPT4 Code Description Modifier [id number] 99214 - WC PHYS LEVEL 4 - EST PT 25 ICD-10 Diagnosis Description L97.822 Non-pressure chronic ulcer of other part of left lower leg with fat layer exposed I87.332 Chronic  venous hypertension (idiopathic) with  ulcer and inflammation of left lower extremity I10 Essential (primary) hypertension Quantity: 1 : [id number] 97597 - WC PHYS DEBR WO ANESTH 20 SQ CM ICD-10 Diagnosis Description L97.822 Non-pressure chronic ulcer of other part of left lower leg with fat layer exposed Quantity: 1 Electronic Signature(s) Signed: 04/07/2023 2:21:16 PM By: Erin Guess MD FACS Entered By: Erin [name] on 04/07/2023 11:21:15  thrombocytopenia Cardiovascular Medical History: Positive for: Arrhythmia - bundle branch block; Hypertension; Peripheral Venous Disease Gastrointestinal Medical History: Past Medical History Notes: diverticulosis, GERD Endocrine Medical History: Past Medical History Notes: hypothyroidism Genitourinary Medical History: Past Medical History Notes: CKD stage III Musculoskeletal Medical History: Positive for: Gout; Osteoarthritis Past Medical History Notes: DJD, osteoporosis Oncologic Medical History: Past Medical History Notes: breast cancer HBO Extended History Items Eyes: Cataracts Immunizations Pneumococcal Vaccine: Received Pneumococcal Vaccination: Yes Received Pneumococcal Vaccination On or After 60th Birthday: No Implantable Devices No devices added Hospitalization / Surgery History Type of Hospitalization/Surgery Salpingoophorectomy Colon resection Appendectomy Cystoscopy w/ retrogrades Joint replacement Mastectomy right gastric ulcer  repair Abdominal hysterectomy Erin [name], Erin [name] ([id number]) [id number] Page 10 of 10 Family and Social History Cancer: Yes - Siblings; Diabetes: No; Heart Disease: No; Hereditary Spherocytosis: No; Hypertension: No; Kidney Disease: No; Lung Disease: Yes - Siblings; Seizures: No; Stroke: No; Thyroid Problems: No; Tuberculosis: No; Never smoker; Marital Status - Widowed; Alcohol Use: Never; Drug Use: No History; Caffeine Use: Moderate; Financial Concerns: No; Food, Clothing or Shelter Needs: No; Support System Lacking: No; Transportation Concerns: No Psychologist, prison and probation services) Signed: 04/07/2023 2:27:11 PM By: Erin Guess MD FACS Entered By: Erin [name] on 04/07/2023 11:15:49 -------------------------------------------------------------------------------- SuperBill Details Patient Name: Date of Service: [name], [name] 04/07/2023 Medical Record Number: [id number] Patient Account Number: [id number] Date of Birth/Sex: Treating RN: 02/10/1941 (82 y.o. 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 I10 Essential (primary) hypertension Facility Procedures : CPT4 Code: [id number] Description: 97597 - DEBRIDE WOUND 1ST 20 SQ CM OR < ICD-10 Diagnosis Description L97.822 Non-pressure chronic ulcer of other part of left lower leg with fat layer exposed Modifier: Quantity: 1 Physician Procedures : CPT4 Code Description Modifier [id number] 99214 - WC PHYS LEVEL 4 - EST PT 25 ICD-10 Diagnosis Description L97.822 Non-pressure chronic ulcer of other part of left lower leg with fat layer exposed I87.332 Chronic  venous hypertension (idiopathic) with  ulcer and inflammation of left lower extremity I10 Essential (primary) hypertension Quantity: 1 : [id number] 97597 - WC PHYS DEBR WO ANESTH 20 SQ CM ICD-10 Diagnosis Description L97.822 Non-pressure chronic ulcer of other part of left lower leg with fat layer exposed Quantity: 1 Electronic Signature(s) Signed: 04/07/2023 2:21:16 PM By: Erin Guess MD FACS Entered By: Erin [name] on 04/07/2023 11:21:15

## 2023-04-14 ENCOUNTER — Encounter (HOSPITAL_BASED_OUTPATIENT_CLINIC_OR_DEPARTMENT_OTHER): Payer: Medicare HMO | Admitting: General Surgery

## 2023-04-14 DIAGNOSIS — I129 Hypertensive chronic kidney disease with stage 1 through stage 4 chronic kidney disease, or unspecified chronic kidney disease: Secondary | ICD-10-CM | POA: Diagnosis not present

## 2023-04-14 DIAGNOSIS — M1990 Unspecified osteoarthritis, unspecified site: Secondary | ICD-10-CM | POA: Diagnosis not present

## 2023-04-14 DIAGNOSIS — I87332 Chronic venous hypertension (idiopathic) with ulcer and inflammation of left lower extremity: Secondary | ICD-10-CM | POA: Diagnosis not present

## 2023-04-14 DIAGNOSIS — M109 Gout, unspecified: Secondary | ICD-10-CM | POA: Diagnosis not present

## 2023-04-14 DIAGNOSIS — N183 Chronic kidney disease, stage 3 unspecified: Secondary | ICD-10-CM | POA: Diagnosis not present

## 2023-04-14 DIAGNOSIS — L97222 Non-pressure chronic ulcer of left calf with fat layer exposed: Secondary | ICD-10-CM | POA: Diagnosis not present

## 2023-04-14 DIAGNOSIS — Z90721 Acquired absence of ovaries, unilateral: Secondary | ICD-10-CM | POA: Diagnosis not present

## 2023-04-14 DIAGNOSIS — L97822 Non-pressure chronic ulcer of other part of left lower leg with fat layer exposed: Secondary | ICD-10-CM | POA: Diagnosis not present

## 2023-04-14 DIAGNOSIS — I872 Venous insufficiency (chronic) (peripheral): Secondary | ICD-10-CM | POA: Diagnosis not present

## 2023-04-14 NOTE — Progress Notes (Signed)
Cleanser 1 x Per Week/30 Days Discharge Instructions: Cleanse the wound with wound cleanser prior to applying a clean dressing using gauze sponges, not tissue or cotton balls. Peri-Wound Care: Sween Lotion (Moisturizing lotion) 1 x Per Week/30 Days Discharge Instructions: Apply moisturizing lotion as directed Topical: Gentamicin 1 x Per Week/30 Days Discharge Instructions: As directed by physician Topical: Mupirocin Ointment 1 x Per Week/30 Days Discharge Instructions: Apply Mupirocin (Bactroban) as instructed Topical: Skintegrity Hydrogel 4 (oz) 1 x Per Week/30 Days Discharge Instructions: Apply hydrogel as directed Secondary Dressing: Optifoam Non-Adhesive Dressing, 4x4 in 1 x Per Week/30 Days Discharge Instructions: Apply over primary dressing as directed. Secured With: Elastic Bandage 4 inch (ACE bandage) 1 x Per Week/30 Days Discharge Instructions: Secure with ACE bandage as directed. Secured With: [employer], 4.5x3.1 (in/yd) 1 x Per Week/30 Days Discharge Instructions: Secure with Kerlix as directed. Electronic Signature(s) Signed: 04/14/2023 2:27:24 PM By: Erin Guess MD FACS Entered By: Erin [name] on 04/14/2023 14:22:12 -------------------------------------------------------------------------------- Problem List Details Patient Name: Date of Service: [name], [name] 04/14/2023 2:00 PM Medical Record Number: [id number] Patient Account Number: [id number] Date of Birth/Sex: Treating RN: 24-Nov-1940 (82 y.o. 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 04/14/2023 2:00 PM Medical Record Number: [id number] Patient Account Number: [id number] Date of Birth/Sex: Treating RN: 1941/04/05 (82 y.o. F) Primary Care Provider: Hoyle [name] Other Clinician: Referring Provider: Treating Provider/Extender: Erin [name] Weeks in Treatment: 14 Subjective Chief Complaint Information obtained from Patient Patient presents for treatment of an open ulcer due to venous insufficiency History of Present Illness (HPI) ADMISSION 11/26/2022 This is an 82 year old woman who was referred by her primary care provider for a persistent nonhealing wound on her left lower extremity. It has been present since February of this year. 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 I do not see any formal venous reflux studies in the electronic medical  record. She is not diabetic. She does not smoke. Her PCP has been washing her leg each week and applying a Kerlix and Coban wrap, but has not performed any formal debridement and has not been using any sort of topical contact layer or ointment. ABI in clinic was noncompressible, but she has a palpable pedal pulse. 12/10/2022: The wound is a bit smaller and cleaner today. There is still fairly heavy slough on the surface. Edema control is acceptable. 12/16/2022: The wound is measuring the slightest bit smaller today. There is still thick slough on the surface. Edema control is good. 12/23/2022: The wound is a little bit smaller again today. Still with fairly heavy slough accumulation.  Erin [name], Erin [name] ([id number]) [id number] Page 1 of 10 Visit Report for 04/14/2023 Chief Complaint Document Details Patient Name: Date of Service: [name], [name] RBA RA N. 04/14/2023 2:00 PM Medical Record Number: [id number] Patient Account Number: [id number] Date of Birth/Sex: Treating RN: [birth year] (82 y.o. F) Primary Care Provider: Hoyle [name] Other Clinician: Referring Provider: Treating Provider/Extender: Erin [name] Weeks in Treatment: 29 Information Obtained from: Patient Chief Complaint Patient presents for treatment of an open ulcer due to venous insufficiency Electronic Signature(s) Signed: 04/14/2023 2:18:17 PM By: Erin Guess MD FACS Entered By: Erin [name] on 04/14/2023 14:18:17 -------------------------------------------------------------------------------- Debridement Details Patient Name: Date of Service: [name], [name] 04/14/2023 2:00 PM Medical Record Number: [id number] Patient Account Number: [id number] Date of Birth/Sex: Treating RN: 1940-09-09 (82 y.o. Erin [name], Erin [name] Primary Care Provider: Hoyle [name] Other Clinician: Referring Provider: Treating Provider/Extender: Erin [name] Weeks in Treatment: 19 Debridement Performed for Assessment: Wound #1 Left,Medial Lower Leg Performed By: Physician Erin Guess, MD The following information was scribed by: Erin [name] The information was scribed for: Erin [name] Debridement Type: Debridement Severity of Tissue Pre Debridement: Fat layer exposed Level of Consciousness (Pre-procedure): Awake and Alert Pre-procedure Verification/Time Out Yes - 14:10 Taken: Start Time: 14:12 Pain Control: Lidocaine 5% topical ointment Percent of Wound Bed Debrided: 100% T Area Debrided (cm): otal 0.28 Tissue and other material debrided: Non-Viable, Eschar, Slough, Slough Level: Non-Viable Tissue Debridement  Description: Selective/Open Wound Instrument: Curette Bleeding: Minimum Hemostasis Achieved: Pressure Procedural Pain: 1 Post Procedural Pain: 0 Response to Treatment: Procedure was tolerated well Level of Consciousness (Post- Awake and Alert procedure): Post Debridement Measurements of Total Wound Length: (cm) 0.9 Width: (cm) 0.4 Depth: (cm) 0.1 Volume: (cm) 0.028 Erin [name], Erin [name] ([id number]) [id number] Page 2 of 10 Character of Wound/Ulcer Post Debridement: Stable Severity of Tissue Post Debridement: Fat layer exposed Post Procedure Diagnosis Same as Pre-procedure Electronic Signature(s) Signed: 04/14/2023 2:27:24 PM By: Erin Guess MD FACS Signed: 04/14/2023 3:50:29 PM By: Erin Deed RN, BSN Entered By: Erin [name] on 04/14/2023 14:15:35 -------------------------------------------------------------------------------- HPI Details Patient Name: Date of Service: [name], [name] 04/14/2023 2:00 PM Medical Record Number: [id number] Patient Account Number: [id number] Date of Birth/Sex: Treating RN: 01-18-1941 (82 y.o. F) Primary Care Provider: Hoyle [name] Other Clinician: Referring Provider: Treating Provider/Extender: Erin [name] Weeks in Treatment: 44 History of Present Illness HPI Description: ADMISSION 11/26/2022 This is an 82 year old woman who was referred by her primary care provider for a persistent nonhealing wound on her left lower extremity. It has been present since February of this year. 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 I do not see any formal venous reflux studies in the electronic medical record. She is not diabetic. She does not smoke. Her PCP has been washing her leg each week and applying a Kerlix and Coban wrap, but has not performed any formal debridement and has not been using any  sort of topical contact layer or ointment. ABI in clinic was noncompressible, but she has a palpable pedal pulse. 12/10/2022: The wound is a bit smaller and cleaner today. There is still fairly heavy slough on the surface. Edema control is acceptable. 12/16/2022: The wound is measuring the slightest bit smaller today. There is still thick slough on the surface. Edema control is good. 12/23/2022: The wound is a little bit  Cleanser 1 x Per Week/30 Days Discharge Instructions: Cleanse the wound with wound cleanser prior to applying a clean dressing using gauze sponges, not tissue or cotton balls. Peri-Wound Care: Sween Lotion (Moisturizing lotion) 1 x Per Week/30 Days Discharge Instructions: Apply moisturizing lotion as directed Topical: Gentamicin 1 x Per Week/30 Days Discharge Instructions: As directed by physician Topical: Mupirocin Ointment 1 x Per Week/30 Days Discharge Instructions: Apply Mupirocin (Bactroban) as instructed Topical: Skintegrity Hydrogel 4 (oz) 1 x Per Week/30 Days Discharge Instructions: Apply hydrogel as directed Secondary Dressing: Optifoam Non-Adhesive Dressing, 4x4 in 1 x Per Week/30 Days Discharge Instructions: Apply over primary dressing as directed. Secured With: Elastic Bandage 4 inch (ACE bandage) 1 x Per Week/30 Days Discharge Instructions: Secure with ACE bandage as directed. Secured With: [employer], 4.5x3.1 (in/yd) 1 x Per Week/30 Days Discharge Instructions: Secure with Kerlix as directed. Electronic Signature(s) Signed: 04/14/2023 2:27:24 PM By: Erin Guess MD FACS Entered By: Erin [name] on 04/14/2023 14:22:12 -------------------------------------------------------------------------------- Problem List Details Patient Name: Date of Service: [name], [name] 04/14/2023 2:00 PM Medical Record Number: [id number] Patient Account Number: [id number] Date of Birth/Sex: Treating RN: 24-Nov-1940 (82 y.o. 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 04/14/2023 2:00 PM Medical Record Number: [id number] Patient Account Number: [id number] Date of Birth/Sex: Treating RN: 1941/04/05 (82 y.o. F) Primary Care Provider: Hoyle [name] Other Clinician: Referring Provider: Treating Provider/Extender: Erin [name] Weeks in Treatment: 14 Subjective Chief Complaint Information obtained from Patient Patient presents for treatment of an open ulcer due to venous insufficiency History of Present Illness (HPI) ADMISSION 11/26/2022 This is an 82 year old woman who was referred by her primary care provider for a persistent nonhealing wound on her left lower extremity. It has been present since February of this year. 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 I do not see any formal venous reflux studies in the electronic medical  record. She is not diabetic. She does not smoke. Her PCP has been washing her leg each week and applying a Kerlix and Coban wrap, but has not performed any formal debridement and has not been using any sort of topical contact layer or ointment. ABI in clinic was noncompressible, but she has a palpable pedal pulse. 12/10/2022: The wound is a bit smaller and cleaner today. There is still fairly heavy slough on the surface. Edema control is acceptable. 12/16/2022: The wound is measuring the slightest bit smaller today. There is still thick slough on the surface. Edema control is good. 12/23/2022: The wound is a little bit smaller again today. Still with fairly heavy slough accumulation.  open portion of her wound. We will continue the mixture of topical gentamicin, mupirocin, and hydrogel under Optifoam. Kerlix and Ace bandage compression. Follow-up in 1 week. Electronic Signature(s) Signed: 04/14/2023 2:23:04 PM By: Erin Guess MD FACS Entered By: Erin [name] on 04/14/2023 14:23:04 -------------------------------------------------------------------------------- HxROS Details Patient Name: Date of Service: [name], [name] 04/14/2023 2:00 PM Medical Record Number: [id number] Patient Account Number: [id number] Date of Birth/Sex: Treating RN: August 26, 1940 (82 y.o. F) Primary Care Provider: Hoyle [name] Other Clinician: Referring Provider: Treating Provider/Extender: [name], [name] in Treatment: 62 Eyes Medical History: Positive for: Cataracts Hematologic/Lymphatic Medical History: Past Medical History Notes: thrombocytopenia Cardiovascular Medical History: Positive for: Arrhythmia - bundle branch block; Hypertension; Peripheral Venous Disease Gastrointestinal Medical History: Past Medical History Notes: diverticulosis, GERD Endocrine Medical History: Past Medical History Notes: hypothyroidism Genitourinary Medical History: Past Medical History Notes: CKD stage III Musculoskeletal Medical  History: Positive for: Gout; Osteoarthritis Past Medical History Notes: DJD, osteoporosis Oncologic Medical History: Past Medical History Notes: breast cancer ATONYA, TEMPLER ([id number]) [id number] Page 9 of 10 HBO Extended History Items Eyes: Cataracts Immunizations Pneumococcal Vaccine: Received Pneumococcal Vaccination: Yes Received Pneumococcal Vaccination On or After 60th Birthday: No Implantable Devices No devices added Hospitalization / Surgery History Type of Hospitalization/Surgery Salpingoophorectomy Colon resection Appendectomy Cystoscopy w/ retrogrades Joint replacement Mastectomy right gastric ulcer repair Abdominal hysterectomy Family and Social History Cancer: Yes - Siblings; Diabetes: No; Heart Disease: No; Hereditary Spherocytosis: No; Hypertension: No; Kidney Disease: No; Lung Disease: Yes - Siblings; Seizures: No; Stroke: No; Thyroid Problems: No; Tuberculosis: No; Never smoker; Marital Status - Widowed; Alcohol Use: Never; Drug Use: No History; Caffeine Use: Moderate; Financial Concerns: No; Food, Clothing or Shelter Needs: No; Support System Lacking: No; Transportation Concerns: No Electronic Signature(s) Signed: 04/14/2023 2:27:24 PM By: Erin Guess MD FACS Entered By: Erin [name] on 04/14/2023 14:19:15 -------------------------------------------------------------------------------- SuperBill Details Patient Name: Date of Service: [name], [name] 04/14/2023 Medical Record Number: [id number] Patient Account Number: [id number] Date of Birth/Sex: Treating RN: 08-28-1940 (82 y.o. F) Primary Care Provider: Hoyle [name] Other Clinician: Referring Provider: Treating Provider/Extender: [name], [name] in Treatment: 19 Diagnosis Coding ICD-10 Codes Code Description [phone] Non-pressure chronic ulcer of other part of left lower leg with fat layer exposed I87.332 Chronic venous  hypertension (idiopathic) with ulcer and inflammation of left lower extremity I10 Essential (primary) hypertension Facility Procedures : CPT4 Code: 60109323 Description: 97597 - DEBRIDE WOUND 1ST 20 SQ CM OR < ICD-10 Diagnosis Description L97.822 Non-pressure chronic ulcer of other part of left lower leg with fat layer expose Modifier: d Quantity: 1 Physician Procedures : CPT4 Code Description Modifier [id number] 99214 - WC PHYS LEVEL 4 - EST PT 25 ICD-10 Diagnosis Description L97.822 Non-pressure chronic ulcer of other part of left lower leg with fat layer exposed I87.332 Chronic venous hypertension (idiopathic) with  ulcer and inflammation of left lower extremity Erin [name], Erin [name] ([id number]) [id number] Essential (primary) hypertension Quantity: 1 1227.pdf Page 10 of 10 : [phone] 97597 - WC PHYS DEBR WO ANESTH 20 SQ CM ICD-10 Diagnosis Description L97.822 Non-pressure chronic ulcer of other part of left lower leg with fat layer exposed Quantity: 1 Electronic Signature(s) Signed: 04/14/2023 2:23:20 PM By: Erin Guess MD FACS Entered By: Erin [name] on 04/14/2023 14:23:20  open portion of her wound. We will continue the mixture of topical gentamicin, mupirocin, and hydrogel under Optifoam. Kerlix and Ace bandage compression. Follow-up in 1 week. Electronic Signature(s) Signed: 04/14/2023 2:23:04 PM By: Erin Guess MD FACS Entered By: Erin [name] on 04/14/2023 14:23:04 -------------------------------------------------------------------------------- HxROS Details Patient Name: Date of Service: [name], [name] 04/14/2023 2:00 PM Medical Record Number: [id number] Patient Account Number: [id number] Date of Birth/Sex: Treating RN: August 26, 1940 (82 y.o. F) Primary Care Provider: Hoyle [name] Other Clinician: Referring Provider: Treating Provider/Extender: [name], [name] in Treatment: 62 Eyes Medical History: Positive for: Cataracts Hematologic/Lymphatic Medical History: Past Medical History Notes: thrombocytopenia Cardiovascular Medical History: Positive for: Arrhythmia - bundle branch block; Hypertension; Peripheral Venous Disease Gastrointestinal Medical History: Past Medical History Notes: diverticulosis, GERD Endocrine Medical History: Past Medical History Notes: hypothyroidism Genitourinary Medical History: Past Medical History Notes: CKD stage III Musculoskeletal Medical  History: Positive for: Gout; Osteoarthritis Past Medical History Notes: DJD, osteoporosis Oncologic Medical History: Past Medical History Notes: breast cancer ATONYA, TEMPLER ([id number]) [id number] Page 9 of 10 HBO Extended History Items Eyes: Cataracts Immunizations Pneumococcal Vaccine: Received Pneumococcal Vaccination: Yes Received Pneumococcal Vaccination On or After 60th Birthday: No Implantable Devices No devices added Hospitalization / Surgery History Type of Hospitalization/Surgery Salpingoophorectomy Colon resection Appendectomy Cystoscopy w/ retrogrades Joint replacement Mastectomy right gastric ulcer repair Abdominal hysterectomy Family and Social History Cancer: Yes - Siblings; Diabetes: No; Heart Disease: No; Hereditary Spherocytosis: No; Hypertension: No; Kidney Disease: No; Lung Disease: Yes - Siblings; Seizures: No; Stroke: No; Thyroid Problems: No; Tuberculosis: No; Never smoker; Marital Status - Widowed; Alcohol Use: Never; Drug Use: No History; Caffeine Use: Moderate; Financial Concerns: No; Food, Clothing or Shelter Needs: No; Support System Lacking: No; Transportation Concerns: No Electronic Signature(s) Signed: 04/14/2023 2:27:24 PM By: Erin Guess MD FACS Entered By: Erin [name] on 04/14/2023 14:19:15 -------------------------------------------------------------------------------- SuperBill Details Patient Name: Date of Service: [name], [name] 04/14/2023 Medical Record Number: [id number] Patient Account Number: [id number] Date of Birth/Sex: Treating RN: 08-28-1940 (82 y.o. F) Primary Care Provider: Hoyle [name] Other Clinician: Referring Provider: Treating Provider/Extender: [name], [name] in Treatment: 19 Diagnosis Coding ICD-10 Codes Code Description [phone] Non-pressure chronic ulcer of other part of left lower leg with fat layer exposed I87.332 Chronic venous  hypertension (idiopathic) with ulcer and inflammation of left lower extremity I10 Essential (primary) hypertension Facility Procedures : CPT4 Code: 60109323 Description: 97597 - DEBRIDE WOUND 1ST 20 SQ CM OR < ICD-10 Diagnosis Description L97.822 Non-pressure chronic ulcer of other part of left lower leg with fat layer expose Modifier: d Quantity: 1 Physician Procedures : CPT4 Code Description Modifier [id number] 99214 - WC PHYS LEVEL 4 - EST PT 25 ICD-10 Diagnosis Description L97.822 Non-pressure chronic ulcer of other part of left lower leg with fat layer exposed I87.332 Chronic venous hypertension (idiopathic) with  ulcer and inflammation of left lower extremity Erin [name], Erin [name] ([id number]) [id number] Essential (primary) hypertension Quantity: 1 1227.pdf Page 10 of 10 : [phone] 97597 - WC PHYS DEBR WO ANESTH 20 SQ CM ICD-10 Diagnosis Description L97.822 Non-pressure chronic ulcer of other part of left lower leg with fat layer exposed Quantity: 1 Electronic Signature(s) Signed: 04/14/2023 2:23:20 PM By: Erin Guess MD FACS Entered By: Erin [name] on 04/14/2023 14:23:20  Erin [name], Erin [name] ([id number]) [id number] Page 1 of 10 Visit Report for 04/14/2023 Chief Complaint Document Details Patient Name: Date of Service: [name], [name] RBA RA N. 04/14/2023 2:00 PM Medical Record Number: [id number] Patient Account Number: [id number] Date of Birth/Sex: Treating RN: [birth year] (82 y.o. F) Primary Care Provider: Hoyle [name] Other Clinician: Referring Provider: Treating Provider/Extender: Erin [name] Weeks in Treatment: 29 Information Obtained from: Patient Chief Complaint Patient presents for treatment of an open ulcer due to venous insufficiency Electronic Signature(s) Signed: 04/14/2023 2:18:17 PM By: Erin Guess MD FACS Entered By: Erin [name] on 04/14/2023 14:18:17 -------------------------------------------------------------------------------- Debridement Details Patient Name: Date of Service: [name], [name] 04/14/2023 2:00 PM Medical Record Number: [id number] Patient Account Number: [id number] Date of Birth/Sex: Treating RN: 1940-09-09 (82 y.o. Erin [name], Erin [name] Primary Care Provider: Hoyle [name] Other Clinician: Referring Provider: Treating Provider/Extender: Erin [name] Weeks in Treatment: 19 Debridement Performed for Assessment: Wound #1 Left,Medial Lower Leg Performed By: Physician Erin Guess, MD The following information was scribed by: Erin [name] The information was scribed for: Erin [name] Debridement Type: Debridement Severity of Tissue Pre Debridement: Fat layer exposed Level of Consciousness (Pre-procedure): Awake and Alert Pre-procedure Verification/Time Out Yes - 14:10 Taken: Start Time: 14:12 Pain Control: Lidocaine 5% topical ointment Percent of Wound Bed Debrided: 100% T Area Debrided (cm): otal 0.28 Tissue and other material debrided: Non-Viable, Eschar, Slough, Slough Level: Non-Viable Tissue Debridement  Description: Selective/Open Wound Instrument: Curette Bleeding: Minimum Hemostasis Achieved: Pressure Procedural Pain: 1 Post Procedural Pain: 0 Response to Treatment: Procedure was tolerated well Level of Consciousness (Post- Awake and Alert procedure): Post Debridement Measurements of Total Wound Length: (cm) 0.9 Width: (cm) 0.4 Depth: (cm) 0.1 Volume: (cm) 0.028 Erin [name], Erin [name] ([id number]) [id number] Page 2 of 10 Character of Wound/Ulcer Post Debridement: Stable Severity of Tissue Post Debridement: Fat layer exposed Post Procedure Diagnosis Same as Pre-procedure Electronic Signature(s) Signed: 04/14/2023 2:27:24 PM By: Erin Guess MD FACS Signed: 04/14/2023 3:50:29 PM By: Erin Deed RN, BSN Entered By: Erin [name] on 04/14/2023 14:15:35 -------------------------------------------------------------------------------- HPI Details Patient Name: Date of Service: [name], [name] 04/14/2023 2:00 PM Medical Record Number: [id number] Patient Account Number: [id number] Date of Birth/Sex: Treating RN: 01-18-1941 (82 y.o. F) Primary Care Provider: Hoyle [name] Other Clinician: Referring Provider: Treating Provider/Extender: Erin [name] Weeks in Treatment: 44 History of Present Illness HPI Description: ADMISSION 11/26/2022 This is an 82 year old woman who was referred by her primary care provider for a persistent nonhealing wound on her left lower extremity. It has been present since February of this year. 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 I do not see any formal venous reflux studies in the electronic medical record. She is not diabetic. She does not smoke. Her PCP has been washing her leg each week and applying a Kerlix and Coban wrap, but has not performed any formal debridement and has not been using any  sort of topical contact layer or ointment. ABI in clinic was noncompressible, but she has a palpable pedal pulse. 12/10/2022: The wound is a bit smaller and cleaner today. There is still fairly heavy slough on the surface. Edema control is acceptable. 12/16/2022: The wound is measuring the slightest bit smaller today. There is still thick slough on the surface. Edema control is good. 12/23/2022: The wound is a little bit  Cleanser 1 x Per Week/30 Days Discharge Instructions: Cleanse the wound with wound cleanser prior to applying a clean dressing using gauze sponges, not tissue or cotton balls. Peri-Wound Care: Sween Lotion (Moisturizing lotion) 1 x Per Week/30 Days Discharge Instructions: Apply moisturizing lotion as directed Topical: Gentamicin 1 x Per Week/30 Days Discharge Instructions: As directed by physician Topical: Mupirocin Ointment 1 x Per Week/30 Days Discharge Instructions: Apply Mupirocin (Bactroban) as instructed Topical: Skintegrity Hydrogel 4 (oz) 1 x Per Week/30 Days Discharge Instructions: Apply hydrogel as directed Secondary Dressing: Optifoam Non-Adhesive Dressing, 4x4 in 1 x Per Week/30 Days Discharge Instructions: Apply over primary dressing as directed. Secured With: Elastic Bandage 4 inch (ACE bandage) 1 x Per Week/30 Days Discharge Instructions: Secure with ACE bandage as directed. Secured With: [employer], 4.5x3.1 (in/yd) 1 x Per Week/30 Days Discharge Instructions: Secure with Kerlix as directed. Electronic Signature(s) Signed: 04/14/2023 2:27:24 PM By: Erin Guess MD FACS Entered By: Erin [name] on 04/14/2023 14:22:12 -------------------------------------------------------------------------------- Problem List Details Patient Name: Date of Service: [name], [name] 04/14/2023 2:00 PM Medical Record Number: [id number] Patient Account Number: [id number] Date of Birth/Sex: Treating RN: 24-Nov-1940 (82 y.o. 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 04/14/2023 2:00 PM Medical Record Number: [id number] Patient Account Number: [id number] Date of Birth/Sex: Treating RN: 1941/04/05 (82 y.o. F) Primary Care Provider: Hoyle [name] Other Clinician: Referring Provider: Treating Provider/Extender: Erin [name] Weeks in Treatment: 14 Subjective Chief Complaint Information obtained from Patient Patient presents for treatment of an open ulcer due to venous insufficiency History of Present Illness (HPI) ADMISSION 11/26/2022 This is an 82 year old woman who was referred by her primary care provider for a persistent nonhealing wound on her left lower extremity. It has been present since February of this year. 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 I do not see any formal venous reflux studies in the electronic medical  record. She is not diabetic. She does not smoke. Her PCP has been washing her leg each week and applying a Kerlix and Coban wrap, but has not performed any formal debridement and has not been using any sort of topical contact layer or ointment. ABI in clinic was noncompressible, but she has a palpable pedal pulse. 12/10/2022: The wound is a bit smaller and cleaner today. There is still fairly heavy slough on the surface. Edema control is acceptable. 12/16/2022: The wound is measuring the slightest bit smaller today. There is still thick slough on the surface. Edema control is good. 12/23/2022: The wound is a little bit smaller again today. Still with fairly heavy slough accumulation.  There is still a fairly fibrotic surface underneath and slough. Edema remains well-controlled. 03/31/2023: She has a new wound that was identified when her wraps and dressings were removed today. It is just to the midline of the anterior tibial surface. The fat layer is exposed. There is a little bit of eschar and dry skin along with some thin slough. The wound closer to her ankle measured smaller today. The surface continues to improve although portions of it still remain fibrotic. There is slough buildup present. 04/07/2023: The wound that was new last week has nearly closed, but remains open by perhaps about a millimeter. The wound closer to her ankle had no significant change based on measurements, but visually it looks smaller to me. It is a little bit dry today and the surface is still fibrotic. 04/14/2023: The more proximal wound has completely healed. The wound at her ankle is quite a bit smaller with substantial epithelialization. There is just a small open portion of the distal aspect. It is covered with slough and eschar. Electronic Signature(s) Signed: 04/14/2023 2:19:10 PM By: Erin Guess MD FACS Entered By: Erin [name] on 04/14/2023 14:19:09 -------------------------------------------------------------------------------- Physical Exam Details Patient Name: Date of Service: [name], [name] 04/14/2023 2:00 PM Medical Record Number: [id number] Patient Account Number: [id number] Date of Birth/Sex: Treating RN: 06/03/1941 (82 y.o. F) Primary Care Provider: Hoyle [name] Other Clinician: Referring Provider: Treating Provider/Extender: [name], [name] in Treatment: 75 Constitutional Hypertensive, asymptomatic. . . . no acute  distress. Respiratory Normal work of breathing on room air. Notes 04/14/2023: The more proximal wound has completely healed. The wound at her ankle is quite a bit smaller with substantial epithelialization. There is just a small open portion of the distal aspect. It is covered with slough and eschar. Electronic Signature(s) Signed: 04/14/2023 2:19:38 PM By: Erin Guess MD FACS Entered By: Erin [name] on 04/14/2023 14:19:38 -------------------------------------------------------------------------------- Physician Orders Details Patient Name: Date of Service: [name], [name] 04/14/2023 2:00 PM Medical Record Number: [id number] Patient Account Number: [id number] Date of Birth/Sex: Treating RN: 08-Nov-1940 (82 y.o. Erin [name] Primary Care Provider: Hoyle [name] Other Clinician: Referring Provider: Treating Provider/Extender: Erin [name] Weeks in Treatment: 2 The following information was scribed by: Erin [name] The information was scribed for: Erin [name] Verbal / Phone Orders: No Diagnosis Coding ICD-10 Coding Code Description [phone] Non-pressure chronic ulcer of other part of left lower leg with fat layer exposed I87.332 Chronic venous hypertension (idiopathic) with ulcer and inflammation of left lower extremity Erin [name], Erin [name] ([id number]) [id number] Page 4 of 10 I10 Essential (primary) hypertension Follow-up Appointments ppointment in 1 week. - Dr. Lady Gary - room 1 Return A Tuesday 10/15 @ 2:00 pm Anesthetic (In clinic) Topical Lidocaine 4% applied to wound bed Bathing/ Shower/ Hygiene May shower with protection but do not get wound dressing(s) wet. Protect dressing(s) with water repellant cover (for example, large plastic bag) or a cast cover and may then take shower. Edema Control - Lymphedema / SCD / Other Elevate legs to the level of the heart or above for 30 minutes daily and/or when  sitting for 3-4 times a day throughout the day. Avoid standing for long periods of time. Exercise regularly Wound Treatment Wound #1 - Lower Leg Wound Laterality: Left, Medial Cleanser: Soap and Water 1 x Per Week/30 Days Discharge Instructions: May shower and wash wound with dial antibacterial soap and water prior to dressing change. Cleanser: Wound

## 2023-04-14 NOTE — Progress Notes (Signed)
Birth/Sex: Treating RN: [birth year] (82 y.o. F) Primary Care Damonique Brunelle: [name] Other Clinician: Referring Willoughby Doell: Treating Demetry Bendickson/Extender: [name], [name] in Treatment: 68 Active Problems Location of Pain Severity and Description of Pain Patient Has Paino Erin Site Locations Rate the pain. Erin [name], Erin [name] ([id number]) [id number] Page 5 of 8 Rate the pain. Current Pain Level: 0 Pain  Management and Medication Current Pain Management: Electronic Signature(s) Signed: 04/14/2023 3:50:29 PM By: Zenaida Deed RN, BSN Entered By: Zenaida Deed on 04/14/2023 13:55:49 -------------------------------------------------------------------------------- Patient/Caregiver Education Details Patient Name: Date of Service: Erin [name], Erin [name]. 10/8/2024andnbsp2:00 PM Medical Record Number: [id number] Patient Account Number: [id number] Date of Birth/Gender: Treating RN: [birth year] (82 y.o. Erin [name] Primary Care Physician: [name] Other Clinician: Referring Physician: Treating Physician/Extender: [name] in Treatment: 72 Education Assessment Education Provided To: Patient Education Topics Provided Venous: Methods: Explain/Verbal Responses: Reinforcements needed, State content correctly Electronic Signature(s) Signed: 04/14/2023 3:50:29 PM By: Zenaida Deed RN, BSN Entered By: Zenaida Deed on 04/14/2023 14:07:55 -------------------------------------------------------------------------------- Wound Assessment Details Patient Name: Date of Service: Erin [name], Erin [name]. 04/14/2023 2:00 PM Medical Record Number: [id number] Patient Account Number: [id number] Date of Birth/Sex: Treating RN: 1941-04-11 (82 y.o. 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, Gout, Osteoarthritis Clustered Wound: Erin Photos Wound  Measurements Length: (cm) 0.9 Width: (cm) 0.4 Depth: (cm) 0.1 Area: (cm) 0.283 Volume: (cm) 0.028 % Reduction in Area: 95.2% % Reduction in Volume: 95.2% Epithelialization: Small (1-33%) Tunneling: Erin Undermining: Erin Wound Description Classification: Full Thickness Without Exposed Support Structures Wound Margin: Flat and Intact Exudate Amount: Small Exudate Type: Serous Exudate Color: amber Foul Odor After Cleansing: Erin Slough/Fibrino Yes Wound Bed Granulation Amount: None Present (0%) Exposed Structure Necrotic Amount: Large (67-100%) Fascia Exposed: Erin Necrotic Quality: Adherent Slough Fat Layer (Subcutaneous Tissue) Exposed: Yes Tendon Exposed: Erin Muscle Exposed: Erin Joint Exposed: Erin Bone Exposed: Erin Periwound Skin Texture Texture Color Erin Abnormalities Noted: Yes Erin Abnormalities Noted: Yes Moisture Temperature / Pain Erin Abnormalities Noted: Yes Temperature: Erin Abnormality Treatment Notes Wound #1 (Lower Leg) Wound Laterality: Left, Medial Cleanser Soap and Water Discharge Instruction: May shower and wash wound with dial antibacterial soap and water prior to dressing change. Wound Cleanser Discharge Instruction: Cleanse the wound with wound cleanser prior to applying a clean dressing using gauze sponges, not tissue or cotton balls. Peri-Wound Care Sween Lotion (Moisturizing lotion) Discharge Instruction: Apply moisturizing lotion as directed Topical Gentamicin Discharge Instruction: As directed by physician Mupirocin Ointment Discharge Instruction: Apply Mupirocin (Bactroban) as instructed Erin [name], Erin [name] ([id number]) [id number] Page 7 of 8 Skintegrity Hydrogel 4 (oz) Discharge Instruction: Apply hydrogel as directed Primary Dressing Secondary Dressing Optifoam Non-Adhesive Dressing, 4x4 in Discharge Instruction: Apply over primary dressing as directed. Secured With Elastic Bandage 4 inch (ACE bandage) Discharge Instruction:  Secure with ACE bandage as directed. Kerlix Roll Sterile, 4.5x3.1 (in/yd) Discharge Instruction: Secure with Kerlix as directed. Compression Wrap Compression Stockings Add-Ons Electronic Signature(s) Signed: 04/14/2023 3:50:29 PM By: Zenaida Deed RN, BSN Entered By: Zenaida Deed on 04/14/2023 14:08:38 -------------------------------------------------------------------------------- Wound Assessment Details Patient  Birth/Sex: Treating RN: [birth year] (82 y.o. F) Primary Care Damonique Brunelle: [name] Other Clinician: Referring Willoughby Doell: Treating Demetry Bendickson/Extender: [name], [name] in Treatment: 68 Active Problems Location of Pain Severity and Description of Pain Patient Has Paino Erin Site Locations Rate the pain. Erin [name], Erin [name] ([id number]) [id number] Page 5 of 8 Rate the pain. Current Pain Level: 0 Pain  Management and Medication Current Pain Management: Electronic Signature(s) Signed: 04/14/2023 3:50:29 PM By: Zenaida Deed RN, BSN Entered By: Zenaida Deed on 04/14/2023 13:55:49 -------------------------------------------------------------------------------- Patient/Caregiver Education Details Patient Name: Date of Service: Erin [name], Erin [name]. 10/8/2024andnbsp2:00 PM Medical Record Number: [id number] Patient Account Number: [id number] Date of Birth/Gender: Treating RN: [birth year] (82 y.o. Erin [name] Primary Care Physician: [name] Other Clinician: Referring Physician: Treating Physician/Extender: [name] in Treatment: 72 Education Assessment Education Provided To: Patient Education Topics Provided Venous: Methods: Explain/Verbal Responses: Reinforcements needed, State content correctly Electronic Signature(s) Signed: 04/14/2023 3:50:29 PM By: Zenaida Deed RN, BSN Entered By: Zenaida Deed on 04/14/2023 14:07:55 -------------------------------------------------------------------------------- Wound Assessment Details Patient Name: Date of Service: Erin [name], Erin [name]. 04/14/2023 2:00 PM Medical Record Number: [id number] Patient Account Number: [id number] Date of Birth/Sex: Treating RN: 1941-04-11 (82 y.o. 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, Gout, Osteoarthritis Clustered Wound: Erin Photos Wound  Measurements Length: (cm) 0.9 Width: (cm) 0.4 Depth: (cm) 0.1 Area: (cm) 0.283 Volume: (cm) 0.028 % Reduction in Area: 95.2% % Reduction in Volume: 95.2% Epithelialization: Small (1-33%) Tunneling: Erin Undermining: Erin Wound Description Classification: Full Thickness Without Exposed Support Structures Wound Margin: Flat and Intact Exudate Amount: Small Exudate Type: Serous Exudate Color: amber Foul Odor After Cleansing: Erin Slough/Fibrino Yes Wound Bed Granulation Amount: None Present (0%) Exposed Structure Necrotic Amount: Large (67-100%) Fascia Exposed: Erin Necrotic Quality: Adherent Slough Fat Layer (Subcutaneous Tissue) Exposed: Yes Tendon Exposed: Erin Muscle Exposed: Erin Joint Exposed: Erin Bone Exposed: Erin Periwound Skin Texture Texture Color Erin Abnormalities Noted: Yes Erin Abnormalities Noted: Yes Moisture Temperature / Pain Erin Abnormalities Noted: Yes Temperature: Erin Abnormality Treatment Notes Wound #1 (Lower Leg) Wound Laterality: Left, Medial Cleanser Soap and Water Discharge Instruction: May shower and wash wound with dial antibacterial soap and water prior to dressing change. Wound Cleanser Discharge Instruction: Cleanse the wound with wound cleanser prior to applying a clean dressing using gauze sponges, not tissue or cotton balls. Peri-Wound Care Sween Lotion (Moisturizing lotion) Discharge Instruction: Apply moisturizing lotion as directed Topical Gentamicin Discharge Instruction: As directed by physician Mupirocin Ointment Discharge Instruction: Apply Mupirocin (Bactroban) as instructed Erin [name], Erin [name] ([id number]) [id number] Page 7 of 8 Skintegrity Hydrogel 4 (oz) Discharge Instruction: Apply hydrogel as directed Primary Dressing Secondary Dressing Optifoam Non-Adhesive Dressing, 4x4 in Discharge Instruction: Apply over primary dressing as directed. Secured With Elastic Bandage 4 inch (ACE bandage) Discharge Instruction:  Secure with ACE bandage as directed. Kerlix Roll Sterile, 4.5x3.1 (in/yd) Discharge Instruction: Secure with Kerlix as directed. Compression Wrap Compression Stockings Add-Ons Electronic Signature(s) Signed: 04/14/2023 3:50:29 PM By: Zenaida Deed RN, BSN Entered By: Zenaida Deed on 04/14/2023 14:08:38 -------------------------------------------------------------------------------- Wound Assessment Details Patient  Erin [name], Erin [name] ([id number]) [id number] Page 1 of 8 Visit Report for 04/14/2023 Arrival Information Details Patient Name: Date of Service: Erin [name], [name] [name]. 04/14/2023 2:00 PM Medical Record Number: [id number] Patient Account Number: [id number] Date of Birth/Sex: Treating RN: [birth year] (82 y.o. F) Primary Care Imanni Burdine: [name] Other Clinician: Referring Hayli Milligan: Treating Daijanae Rafalski/Extender: [name] Weeks in Treatment: 66 Visit Information History Since Last Visit Added or deleted any medications: Erin Patient Arrived: Ambulatory Any new allergies or adverse reactions: Erin Arrival Time: 13:53 Had a fall or experienced change in Erin Accompanied By: daughter activities of daily living that may affect Transfer Assistance: None risk of falls: Patient Identification Verified: Yes Signs or symptoms of abuse/neglect since last visito Erin Secondary Verification Process Completed: Yes Hospitalized since last visit: Erin Patient Requires Transmission-Based Precautions: Erin Implantable device outside of the clinic excluding Erin Patient Has Alerts: Erin cellular tissue based products placed in the center since last visit: Has Dressing in Place as Prescribed: Yes Has Compression in Place as Prescribed: Yes Pain Present Now: Erin Electronic Signature(s) Signed: 04/14/2023 3:50:29 PM By: Zenaida Deed RN, BSN Entered By: Zenaida Deed on 04/14/2023 13:55:37 -------------------------------------------------------------------------------- Encounter Discharge Information Details Patient Name: Date of Service: Erin [name], Erin [name]. 04/14/2023 2:00 PM Medical Record Number: [id number] Patient Account Number: [id number] Date of Birth/Sex: Treating RN: 1941-03-14 (82 y.o. Erin [name] Primary Care Jaclin Finks: [name] Other Clinician: Referring Kaushal Vannice: Treating Deacon Gadbois/Extender: [name] Weeks in Treatment: 35 Encounter Discharge Information Items Post Procedure Vitals Discharge Condition: Stable Temperature (F): 97.9 Ambulatory Status: Ambulatory Pulse (bpm): 61 Discharge Destination: Home Respiratory Rate (breaths/min): 18 Transportation: Private Auto Blood Pressure (mmHg): 198/78 Accompanied By: daughter Schedule Follow-up Appointment: Yes Clinical Summary of Care: Patient Declined Electronic Signature(s) Signed: 04/14/2023 3:50:29 PM By: Zenaida Deed RN, BSN Entered By: Zenaida Deed on 04/14/2023 14:28:27 Erin [name] ([id number]) [id number] Page 2 of 8 -------------------------------------------------------------------------------- Lower Extremity Assessment Details Patient Name: Date of Service: Erin [name], Erin [name]. 04/14/2023 2:00 PM Medical Record Number: [id number] Patient Account Number: [id number] Date of Birth/Sex: Treating RN: April 03, 1941 (82 y.o. Erin [name] Primary Care Ellard Nan: [name] Other Clinician: Referring Tao Satz: Treating Brenly Trawick/Extender: [name], [name] in Treatment: 19 Edema Assessment Assessed: [Left: Erin] [Right: Erin] Edema: [Left: Ye] [Right: s] Calf Left: Right: Point of Measurement: From Medial Instep 28 cm Ankle Left: Right: Point of Measurement: From Medial Instep 18 cm Vascular Assessment Pulses: Dorsalis Pedis Palpable: [Left:Yes] Extremity colors, hair growth, and conditions: Extremity Color: [Left:Hyperpigmented] Hair Growth on Extremity: [Left:Erin] Temperature of Extremity: [Left:Warm < 3 seconds] Electronic Signature(s) Signed: 04/14/2023 3:50:29 PM By: Zenaida Deed RN, BSN Entered By: Zenaida Deed on 04/14/2023 14:02:51 -------------------------------------------------------------------------------- Multi Wound Chart Details Patient Name: Date of Service: Erin [name], Erin [name]. 04/14/2023 2:00 PM Medical Record Number:  [id number] Patient Account Number: [id number] Date of Birth/Sex: Treating RN: 10/23/1940 (82 y.o. F) Primary Care Priseis Cratty: [name] Other Clinician: Referring Dearion Huot: Treating Yenifer Saccente/Extender: [name], [name] in Treatment: 19 Vital Signs Height(in): 60 Pulse(bpm): 61 Weight(lbs): 138 Blood Pressure(mmHg): 198/78 Body Mass Index(BMI): 26.9 Temperature(F): 97.9 Respiratory Rate(breaths/min): 18 [1:Photos:] [[name]:[name] [id number] Page 3 of 8] Left, Medial Lower Leg Left, Proximal, Medial Lower Leg [name] Wound Location: Gradually Appeared Gradually Appeared [name] Wounding Event: Venous Leg Ulcer Venous Leg Ulcer [name] Primary Etiology: Cataracts, Arrhythmia, Hypertension, Cataracts, Arrhythmia, Hypertension, [name]  Comorbid History: Peripheral Venous Disease, Gout, Peripheral Venous Disease, Gout, Osteoarthritis Osteoarthritis 08/29/2022 03/31/2023 [name] Date Acquired: 19 2 [name] Weeks of Treatment: Open Healed - Epithelialized [name] Wound Status: Erin Erin [name] Wound Recurrence: 0.9x0.4x0.1 0x0x0 [name] Measurements L x W x D (cm) 0.283 0 [name] A (cm) : rea 0.028 0 [name] Volume (cm) : 95.20% 100.00% [name] % Reduction in A rea: 95.20% 100.00% [name] % Reduction in Volume: Full Thickness Without Exposed Full Thickness Without Exposed [name] Classification: Support Structures Support Structures Small None Present [name] Exudate A mount: Serous [name] [name] Exudate Type: amber [name] [name] Exudate Color: Flat and Intact [name] [name] Wound Margin: None Present (0%) None Present (0%) [name] Granulation A mount: Large (67-100%) None Present (0%) [name] Necrotic A mount: Fat Layer (Subcutaneous Tissue): Yes Fascia: Erin [name] Exposed Structures: Fascia: Erin Fat Layer (Subcutaneous Tissue): Erin Tendon: Erin Tendon: Erin Muscle: Erin Muscle: Erin Joint: Erin Joint: Erin Bone: Erin Bone: Erin Small (1-33%) Large (67-100%) [name] Epithelialization: Debridement - Selective/Open  Wound [name] [name] Debridement: Pre-procedure Verification/Time Out 14:10 [name] [name] Taken: Lidocaine 5% topical ointment [name] [name] Pain Control: Necrotic/Eschar, Slough [name] [name] Tissue Debrided: Non-Viable Tissue [name] [name] Level: 0.28 [name] [name] Debridement A (sq cm): rea Curette [name] [name] Instrument: Minimum [name] [name] Bleeding: Pressure [name] [name] Hemostasis A chieved: 1 [name] [name] Procedural Pain: 0 [name] [name] Post Procedural Pain: Procedure was tolerated well [name] [name] Debridement Treatment Response: 0.9x0.4x0.1 [name] [name] Post Debridement Measurements L x W x D (cm) 0.028 [name] [name] Post Debridement Volume: (cm) Erin Abnormalities Noted Erin Abnormalities Noted [name] Periwound Skin Texture: Erin Abnormalities Noted Erin Abnormalities Noted [name] Periwound Skin Moisture: Erin Abnormalities Noted Hemosiderin Staining: Yes [name] Periwound Skin Color: Erin Abnormality Erin Abnormality [name] Temperature: Debridement [name] [name] Procedures Performed: Treatment Notes Electronic Signature(s) Signed: 04/14/2023 2:18:11 PM By: Duanne Guess MD FACS Entered By: Duanne Guess on 04/14/2023 14:18:11 -------------------------------------------------------------------------------- Multi-Disciplinary Care Plan Details Patient Name: Date of Service: Erin [name], Erin [name]. 04/14/2023 2:00 PM Medical Record Number: [id number] Patient Account Number: [id number] Date of Birth/Sex: Treating RN: 1940/08/08 (82 y.o. Erin [name] Primary Care Ashika Apuzzo: [name] Other Clinician: Referring Chantrell Apsey: Treating Kameron Glazebrook/Extender: [name] in Treatment: [address] [place] [name] ([id number]) [id number] Page 4 of 8 Multidisciplinary Care Plan reviewed with physician Active Inactive Necrotic Tissue Nursing Diagnoses: Impaired tissue integrity related to necrotic/devitalized tissue Knowledge deficit related to management of necrotic/devitalized  tissue Goals: Necrotic/devitalized tissue will be minimized in the wound bed Date Initiated: 11/26/2022 Target Resolution Date: 05/06/2023 Goal Status: Active Patient/caregiver will verbalize understanding of reason and process for debridement of necrotic tissue Date Initiated: 11/26/2022 Date Inactivated: 01/20/2023 Target Resolution Date: 03/06/2023 Goal Status: Met Interventions: Assess patient pain level pre-, during and post procedure and prior to discharge Provide education on necrotic tissue and debridement process Treatment Activities: Apply topical anesthetic as ordered : 11/26/2022 Notes: Wound/Skin Impairment Nursing Diagnoses: Impaired tissue integrity Knowledge deficit related to ulceration/compromised skin integrity Goals: Patient/caregiver will verbalize understanding of skin care regimen Date Initiated: 11/26/2022 Target Resolution Date: 05/06/2023 Goal Status: Active Interventions: Assess ulceration(s) every visit Treatment Activities: Skin care regimen initiated : 11/26/2022 Topical wound management initiated : 11/26/2022 Notes: Electronic Signature(s) Signed: 04/14/2023 3:50:29 PM By: Zenaida Deed RN, BSN Entered By: Zenaida Deed on 04/14/2023 14:06:07 -------------------------------------------------------------------------------- Pain Assessment Details Patient Name: Date of Service: Erin [name], Erin [name]. 04/14/2023 2:00 PM Medical Record Number: [id number] Patient Account Number: [id number] Date of

## 2023-04-20 DIAGNOSIS — N183 Chronic kidney disease, stage 3 unspecified: Secondary | ICD-10-CM | POA: Diagnosis not present

## 2023-04-20 DIAGNOSIS — E039 Hypothyroidism, unspecified: Secondary | ICD-10-CM | POA: Diagnosis not present

## 2023-04-20 DIAGNOSIS — R7301 Impaired fasting glucose: Secondary | ICD-10-CM | POA: Diagnosis not present

## 2023-04-20 DIAGNOSIS — E785 Hyperlipidemia, unspecified: Secondary | ICD-10-CM | POA: Diagnosis not present

## 2023-04-20 DIAGNOSIS — I87332 Chronic venous hypertension (idiopathic) with ulcer and inflammation of left lower extremity: Secondary | ICD-10-CM | POA: Diagnosis not present

## 2023-04-20 DIAGNOSIS — D696 Thrombocytopenia, unspecified: Secondary | ICD-10-CM | POA: Diagnosis not present

## 2023-04-20 DIAGNOSIS — N1831 Chronic kidney disease, stage 3a: Secondary | ICD-10-CM | POA: Diagnosis not present

## 2023-04-20 DIAGNOSIS — Z23 Encounter for immunization: Secondary | ICD-10-CM | POA: Diagnosis not present

## 2023-04-20 DIAGNOSIS — I129 Hypertensive chronic kidney disease with stage 1 through stage 4 chronic kidney disease, or unspecified chronic kidney disease: Secondary | ICD-10-CM | POA: Diagnosis not present

## 2023-04-21 ENCOUNTER — Encounter (HOSPITAL_BASED_OUTPATIENT_CLINIC_OR_DEPARTMENT_OTHER): Payer: Medicare HMO | Admitting: General Surgery

## 2023-04-21 DIAGNOSIS — L97822 Non-pressure chronic ulcer of other part of left lower leg with fat layer exposed: Secondary | ICD-10-CM | POA: Diagnosis not present

## 2023-04-21 DIAGNOSIS — Z90721 Acquired absence of ovaries, unilateral: Secondary | ICD-10-CM | POA: Diagnosis not present

## 2023-04-21 DIAGNOSIS — L97222 Non-pressure chronic ulcer of left calf with fat layer exposed: Secondary | ICD-10-CM | POA: Diagnosis not present

## 2023-04-21 DIAGNOSIS — M1990 Unspecified osteoarthritis, unspecified site: Secondary | ICD-10-CM | POA: Diagnosis not present

## 2023-04-21 DIAGNOSIS — I129 Hypertensive chronic kidney disease with stage 1 through stage 4 chronic kidney disease, or unspecified chronic kidney disease: Secondary | ICD-10-CM | POA: Diagnosis not present

## 2023-04-21 DIAGNOSIS — N183 Chronic kidney disease, stage 3 unspecified: Secondary | ICD-10-CM | POA: Diagnosis not present

## 2023-04-21 DIAGNOSIS — I872 Venous insufficiency (chronic) (peripheral): Secondary | ICD-10-CM | POA: Diagnosis not present

## 2023-04-21 DIAGNOSIS — M109 Gout, unspecified: Secondary | ICD-10-CM | POA: Diagnosis not present

## 2023-04-21 DIAGNOSIS — I87332 Chronic venous hypertension (idiopathic) with ulcer and inflammation of left lower extremity: Secondary | ICD-10-CM | POA: Diagnosis not present

## 2023-04-21 NOTE — Progress Notes (Signed)
Erin, [name] ([id number]) [id number] Page 1 of 10 Visit Report for 04/21/2023 Arrival Information Details Patient Name: Date of Service: [name], [name] RBA RA N. 04/21/2023 2:00 PM Medical Record Number: [id number] Patient Account Number: [id number] Date of Birth/Sex: Treating RN: Aug 11, 1940 (82 y.o. Erin [name] Primary Care Feleshia Zundel: [name] Other Clinician: Referring Rhett Najera: Treating Quamel Fitzmaurice/Extender: [name] Weeks in Treatment: 20 Visit Information History Since Last Visit Added or deleted any medications: No Patient Arrived: Ambulatory Any new allergies or adverse reactions: No Arrival Time: 14:30 Had a fall or experienced change in No Accompanied By: daughter activities of daily living that may affect Transfer Assistance: None risk of falls: Patient Identification Verified: Yes Signs or symptoms of abuse/neglect since last visito No Secondary Verification Process Completed: Yes Hospitalized since last visit: No Patient Requires Transmission-Based Precautions: No Implantable device outside of the clinic excluding No Patient Has Alerts: No cellular tissue based products placed in the center since last visit: Has Dressing in Place as Prescribed: Yes Has Compression in Place as Prescribed: Yes Pain Present Now: No Electronic Signature(s) Signed: 04/21/2023 4:01:24 PM By: Zenaida Deed RN, BSN Entered By: Zenaida Deed on 04/21/2023 14:31:07 -------------------------------------------------------------------------------- Encounter Discharge Information Details Patient Name: Date of Service: [name], [name] 04/21/2023 2:00 PM Medical Record Number: [id number] Patient Account Number: [id number] Date of Birth/Sex: Treating RN: August 24, 1940 (82 y.o. Erin [name] Primary Care Ashantee Deupree: [name] Other Clinician: Referring Carsen Leaf: Treating Terree Gaultney/Extender: [name] Weeks in Treatment: 20 Encounter Discharge Information Items Post Procedure Vitals Discharge Condition: Stable Temperature (F): 97.7 Ambulatory Status: Ambulatory Pulse (bpm): 56 Discharge Destination: Home Respiratory Rate (breaths/min): 18 Transportation: Private Auto Blood Pressure (mmHg): 179/69 Accompanied By: daughter Schedule Follow-up Appointment: Yes Clinical Summary of Care: Patient Declined Electronic Signature(s) Signed: 04/21/2023 4:01:24 PM By: Zenaida Deed RN, BSN Entered By: Zenaida Deed on 04/21/2023 15:12:08 [name] ([id number]) [id number] Page 2 of 10 -------------------------------------------------------------------------------- Lower Extremity Assessment Details Patient Name: Date of Service: [name], [name] 04/21/2023 2:00 PM Medical Record Number: [id number] Patient Account Number: [id number] Date of Birth/Sex: Treating RN: [birth year] (82 y.o. Erin [name] Primary Care Delmi Fulfer: [name] Other Clinician: Referring Lindie Roberson: Treating Kaylynn Chamblin/Extender: [name], [name] in Treatment: 20 Edema Assessment Assessed: [Left: No] [Right: No] Edema: [Left: Ye] [Right: s] Calf Left: Right: Point of Measurement: From Medial Instep 31 cm Ankle Left: Right: Point of Measurement: From Medial Instep 19.5 cm Vascular Assessment Pulses: Dorsalis Pedis Palpable: [Left:Yes] Extremity colors, hair growth, and conditions: Extremity Color: [Left:Hyperpigmented] Hair Growth on Extremity: [Left:No] Temperature of Extremity: [Left:Warm < 3 seconds] Electronic Signature(s) Signed: 04/21/2023 4:01:24 PM By: Zenaida Deed RN, BSN Entered By: Zenaida Deed on 04/21/2023 14:40:23 -------------------------------------------------------------------------------- Multi Wound Chart Details Patient Name: Date of Service: [name], [name] 04/21/2023  2:00 PM Medical Record Number: [id number] Patient Account Number: [id number] Date of Birth/Sex: Treating RN: October 27, 1940 (82 y.o. 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 04/21/2023 2:00 PM Medical Record Number: [id number] Patient Account Number: [id number] Date of Birth/Sex: Treating RN: Aug 11, 1940 (82 y.o. Erin [name] Primary Care Feleshia Zundel: [name] Other Clinician: Referring Rhett Najera: Treating Quamel Fitzmaurice/Extender: [name] Weeks in Treatment: 20 Visit Information History Since Last Visit Added or deleted any medications: No Patient Arrived: Ambulatory Any new allergies or adverse reactions: No Arrival Time: 14:30 Had a fall or experienced change in No Accompanied By: daughter activities of daily living that may affect Transfer Assistance: None risk of falls: Patient Identification Verified: Yes Signs or symptoms of abuse/neglect since last visito No Secondary Verification Process Completed: Yes Hospitalized since last visit: No Patient Requires Transmission-Based Precautions: No Implantable device outside of the clinic excluding No Patient Has Alerts: No cellular tissue based products placed in the center since last visit: Has Dressing in Place as Prescribed: Yes Has Compression in Place as Prescribed: Yes Pain Present Now: No Electronic Signature(s) Signed: 04/21/2023 4:01:24 PM By: Zenaida Deed RN, BSN Entered By: Zenaida Deed on 04/21/2023 14:31:07 -------------------------------------------------------------------------------- Encounter Discharge Information Details Patient Name: Date of Service: [name], [name] 04/21/2023 2:00 PM Medical Record Number: [id number] Patient Account Number: [id number] Date of Birth/Sex: Treating RN: August 24, 1940 (82 y.o. Erin [name] Primary Care Ashantee Deupree: [name] Other Clinician: Referring Carsen Leaf: Treating Terree Gaultney/Extender: [name] Weeks in Treatment: 20 Encounter Discharge Information Items Post Procedure Vitals Discharge Condition: Stable Temperature (F): 97.7 Ambulatory Status: Ambulatory Pulse (bpm): 56 Discharge Destination: Home Respiratory Rate (breaths/min): 18 Transportation: Private Auto Blood Pressure (mmHg): 179/69 Accompanied By: daughter Schedule Follow-up Appointment: Yes Clinical Summary of Care: Patient Declined Electronic Signature(s) Signed: 04/21/2023 4:01:24 PM By: Zenaida Deed RN, BSN Entered By: Zenaida Deed on 04/21/2023 15:12:08 [name] ([id number]) [id number] Page 2 of 10 -------------------------------------------------------------------------------- Lower Extremity Assessment Details Patient Name: Date of Service: [name], [name] 04/21/2023 2:00 PM Medical Record Number: [id number] Patient Account Number: [id number] Date of Birth/Sex: Treating RN: [birth year] (82 y.o. Erin [name] Primary Care Delmi Fulfer: [name] Other Clinician: Referring Lindie Roberson: Treating Kaylynn Chamblin/Extender: [name], [name] in Treatment: 20 Edema Assessment Assessed: [Left: No] [Right: No] Edema: [Left: Ye] [Right: s] Calf Left: Right: Point of Measurement: From Medial Instep 31 cm Ankle Left: Right: Point of Measurement: From Medial Instep 19.5 cm Vascular Assessment Pulses: Dorsalis Pedis Palpable: [Left:Yes] Extremity colors, hair growth, and conditions: Extremity Color: [Left:Hyperpigmented] Hair Growth on Extremity: [Left:No] Temperature of Extremity: [Left:Warm < 3 seconds] Electronic Signature(s) Signed: 04/21/2023 4:01:24 PM By: Zenaida Deed RN, BSN Entered By: Zenaida Deed on 04/21/2023 14:40:23 -------------------------------------------------------------------------------- Multi Wound Chart Details Patient Name: Date of Service: [name], [name] 04/21/2023  2:00 PM Medical Record Number: [id number] Patient Account Number: [id number] Date of Birth/Sex: Treating RN: October 27, 1940 (82 y.o. 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 Primary Etiology: Cataracts, Arrhythmia, Hypertension, Cataracts, Arrhythmia, Hypertension,  Wound Open Wounding Event: Gradually Appeared Status: Date Acquired: 08/29/2022 Comorbid Cataracts, Arrhythmia, Hypertension, 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, Gout, Osteoarthritis Clustered Wound: No Photos Wound Measurements Length: (cm) 0.9 Width: (cm) 0.3 Depth: (cm) 0.1 Area: (cm) 0.212 Volume: (cm) 0.021 % Reduction in Area: 96.4% % Reduction in Volume: 96.4% Epithelialization: Small (1-33%) Tunneling: No Undermining: No Wound Description Classification: Full Thickness Without Exposed Support Structures Wound Margin: Flat and Intact Exudate Amount: Small Exudate Type: Serous Exudate Color: amber Foul Odor After Cleansing: No Slough/Fibrino Yes Wound Bed Granulation Amount: Medium (34-66%) Exposed Structure Granulation Quality: Pink Fascia Exposed: No Necrotic Amount: Medium (34-66%) Fat Layer (Subcutaneous Tissue) Exposed: Yes Necrotic Quality: Adherent Slough Tendon Exposed: No Muscle Exposed: No Joint Exposed: No Bone Exposed: No Periwound Skin Texture Texture Color No Abnormalities Noted: Yes No Abnormalities Noted: Yes Moisture Temperature / Pain No Abnormalities Noted: Yes Temperature: No Abnormality Treatment Notes Wound #1 (Lower Leg) Wound Laterality: Left, Medial Cleanser Soap and Water Discharge Instruction: May shower and wash wound with dial antibacterial soap and water prior to dressing change. JAIDAH, LOMAX ([id number]) [id number] Page 8 of 10 Wound  Cleanser Discharge Instruction: Cleanse the wound with wound cleanser prior to applying a clean dressing using gauze sponges, not tissue or cotton balls. Peri-Wound Care Sween Lotion (Moisturizing lotion) Discharge Instruction: Apply moisturizing lotion as directed Topical Gentamicin Discharge Instruction: As directed by physician Mupirocin Ointment Discharge Instruction: Apply Mupirocin (Bactroban) as instructed Skintegrity Hydrogel 4 (oz) Discharge Instruction: Apply hydrogel as directed Primary Dressing Secondary Dressing Optifoam Non-Adhesive Dressing, 4x4 in Discharge Instruction: Apply over primary dressing as directed. Secured With Compression Wrap Kerlix Roll 4.5x3.1 (in/yd) Discharge Instruction: Apply Kerlix and Coban compression as directed. Coban Self-Adherent Wrap 4x5 (in/yd) Discharge Instruction: Apply over Kerlix as directed. Compression Stockings Add-Ons Electronic Signature(s) Signed: 04/21/2023 4:01:24 PM By: Zenaida Deed RN, BSN Entered By: Zenaida Deed on 04/21/2023 14:46:09 -------------------------------------------------------------------------------- Wound Assessment Details Patient Name: Date of Service: [name], [name] 04/21/2023 2:00 PM Medical Record Number: [id number] Patient Account Number: [id number] Date of Birth/Sex: Treating RN: 1940/10/24 (82 y.o. 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, Gout, Osteoarthritis Clustered Wound: No Photos SHYNIECE, SCRIPTER ([id number])  [id number] Page 9 of 10 Wound Measurements Length: (cm) 0.7 Width: (cm) 1.3 Depth: (cm) 0.1 Area: (cm) 0.715 Volume: (cm) 0.071 % Reduction in Area: % Reduction in Volume: Epithelialization: Medium (34-66%) Tunneling: No Undermining: No Wound Description Classification: Full Thickness Without Exposed Support Wound Margin: Flat and Intact Exudate Amount: Medium Exudate Type: Sanguinous Exudate Color: red Structures Foul Odor After Cleansing: No Slough/Fibrino Yes Wound Bed Granulation Amount: Medium (34-66%) Exposed Structure Granulation Quality: Red Fascia Exposed: No Necrotic Amount: Medium (34-66%) Fat Layer (Subcutaneous Tissue) Exposed: Yes Necrotic Quality: Eschar Tendon Exposed: No Muscle Exposed: No Joint Exposed: No Bone Exposed: No Periwound Skin Texture Texture Color No Abnormalities Noted: Yes No Abnormalities Noted: No Rubor: Yes Moisture No Abnormalities Noted: Yes Temperature / Pain Temperature: No Abnormality Tenderness on Palpation: Yes Treatment Notes Wound #11 (Lower Leg) Wound Laterality: Left, Posterior Cleanser Peri-Wound Care Sween Lotion (Moisturizing lotion) Discharge Instruction: Apply moisturizing lotion as directed Topical Primary Dressing Maxorb  [name] 04/21/2023 2:00 PM Medical Record Number: [id number] Patient Account Number: [id number] Date of Birth/Sex: Treating RN: Jun 08, 1941 (82 y.o. Erin [name] Primary Care Molleigh Huot: [name] Other Clinician: Referring Suezette Lafave: Treating Akia Montalban/Extender: [name] in Treatment: 20 Multidisciplinary Care Plan reviewed with physician STASHA, NARAINE ([id number]) [id number] Page 5 of 10 Active  Inactive Necrotic Tissue Nursing Diagnoses: Impaired tissue integrity related to necrotic/devitalized tissue Knowledge deficit related to management of necrotic/devitalized tissue Goals: Necrotic/devitalized tissue will be minimized in the wound bed Date Initiated: 11/26/2022 Target Resolution Date: 05/06/2023 Goal Status: Active Patient/caregiver will verbalize understanding of reason and process for debridement of necrotic tissue Date Initiated: 11/26/2022 Date Inactivated: 01/20/2023 Target Resolution Date: 03/06/2023 Goal Status: Met Interventions: Assess patient pain level pre-, during and post procedure and prior to discharge Provide education on necrotic tissue and debridement process Treatment Activities: Apply topical anesthetic as ordered : 11/26/2022 Notes: Wound/Skin Impairment Nursing Diagnoses: Impaired tissue integrity Knowledge deficit related to ulceration/compromised skin integrity Goals: Patient/caregiver will verbalize understanding of skin care regimen Date Initiated: 11/26/2022 Target Resolution Date: 05/06/2023 Goal Status: Active Interventions: Assess ulceration(s) every visit Treatment Activities: Skin care regimen initiated : 11/26/2022 Topical wound management initiated : 11/26/2022 Notes: Electronic Signature(s) Signed: 04/21/2023 4:01:24 PM By: Zenaida Deed RN, BSN Entered By: Zenaida Deed on 04/21/2023 14:48:52 -------------------------------------------------------------------------------- Pain Assessment Details Patient Name: Date of Service: [name], [name] 04/21/2023 2:00 PM Medical Record Number: [id number] Patient Account Number: [id number] Date of Birth/Sex: Treating RN: Mar 28, 1941 (82 y.o. Erin [name] Primary Care Parris Signer: [name] Other Clinician: Referring Kyasia Steuck: Treating Dacota Devall/Extender: [name] Weeks in Treatment: 20 Active Problems Location of Pain Severity and  Description of Pain Patient Has Paino Yes Site Locations Pain Location: TAKIRAH, BINFORD ([id number]) [id number] Page 6 of 10 Pain Location: Generalized Pain With Dressing Change: No Duration of the Pain. Constant / Intermittento Intermittent Rate the pain. Current Pain Level: 2 Character of Pain Describe the Pain: Other: sore Pain Management and Medication Current Pain Management: Is the Current Pain Management Adequate: Adequate How does your wound impact your activities of daily livingo Sleep: No Bathing: No Appetite: No Relationship With Others: No Bladder Continence: No Emotions: No Bowel Continence: No Work: No Toileting: No Drive: No Dressing: No Hobbies: No Electronic Signature(s) Signed: 04/21/2023 4:01:24 PM By: Zenaida Deed RN, BSN Entered By: Zenaida Deed on 04/21/2023 14:32:40 -------------------------------------------------------------------------------- Patient/Caregiver Education Details Patient Name: Date of Service: [name], [name] 10/15/2024andnbsp2:00 PM Medical Record Number: [id number] Patient Account Number: [id number] Date of Birth/Gender: Treating RN: Oct 10, 1940 (82 y.o. Erin [name] Primary Care Physician: [name] Other Clinician: Referring Physician: Treating Physician/Extender: [name] in Treatment: 20 Education Assessment Education Provided To: Patient Education Topics Provided Venous: Methods: Explain/Verbal Responses: Reinforcements needed, State content correctly Electronic Signature(s) Signed: 04/21/2023 4:01:24 PM By: Zenaida Deed RN, BSN Entered By: Zenaida Deed on 04/21/2023 14:49:55 [name] ([id number]) [id number] Page 7 of 10 -------------------------------------------------------------------------------- Wound Assessment Details Patient Name: Date of Service: [name], [name]  04/21/2023 2:00 PM Medical Record Number: [id number] Patient Account Number: [id number] Date of Birth/Sex: Treating RN: 1940/11/08 (82 y.o. Erin [name] Primary Care Yunior Jain: [name] Other Clinician: Referring Kymber Kosar: Treating Jamarkus Lisbon/Extender: [name], [name] in Treatment: 20 Wound Status Wound Number: 1 Primary Venous Leg Ulcer Etiology: Wound Location: Left, Medial Lower Leg  Extra Calcium Alginate, 2x2 (in/in) Discharge Instruction: Apply to wound bed as instructed Secondary Dressing Woven Gauze Sponge, Non-Sterile 4x4 in Discharge Instruction: Apply over primary dressing as directed. Secured With Compression Wrap Kerlix Roll 4.5x3.1 (in/yd) Discharge Instruction: Apply Kerlix and Coban compression as directed. Coban Self-Adherent Wrap 4x5 (in/yd) Discharge Instruction: Apply over Kerlix as directed. Compression Stockings MOLLEIGH, HUOT ([id number]) [id number] Page 10 of 10 Add-Ons Electronic Signature(s) Signed: 04/21/2023 4:01:24 PM By: Zenaida Deed RN, BSN Entered By: Zenaida Deed on  04/21/2023 14:46:52 -------------------------------------------------------------------------------- Vitals Details Patient Name: Date of Service: [name], [name] 04/21/2023 2:00 PM Medical Record Number: [id number] Patient Account Number: [id number] Date of Birth/Sex: Treating RN: 1940/07/26 (82 y.o. Erin [name] Primary Care Amritpal Shropshire: [name] Other Clinician: Referring Joli Koob: Treating Breya Cass/Extender: [name], [name] in Treatment: 20 Vital Signs Time Taken: 14:30 Temperature (F): 97.7 Height (in): 60 Pulse (bpm): 56 Weight (lbs): 138 Respiratory Rate (breaths/min): 18 Body Mass Index (BMI): 26.9 Blood Pressure (mmHg): 179/69 Reference Range: 80 - 120 mg / dl Electronic Signature(s) Signed: 04/21/2023 4:01:24 PM By: Zenaida Deed RN, BSN Entered By: Zenaida Deed on 04/21/2023 14:31:41

## 2023-04-21 NOTE — Progress Notes (Signed)
DHANVI, BOESEN ([id number]) [id number] Page 1 of 11 Visit Report for 04/21/2023 Chief Complaint Document Details Patient Name: Date of Service: [name], [name] RBA RA N. 04/21/2023 2:00 PM Medical Record Number: [id number] Patient Account Number: [id number] Date of Birth/Sex: Treating RN: 1941/02/12 (82 y.o. F) Primary Care Provider: [name] Other Clinician: Referring Provider: Treating Provider/Extender: [name] Weeks in Treatment: 20 Information Obtained from: Patient Chief Complaint Patient presents for treatment of an open ulcer due to venous insufficiency Electronic Signature(s) Signed: 04/21/2023 3:16:32 PM By: Duanne Guess MD FACS Entered By: Duanne Guess on 04/21/2023 12:16:31 -------------------------------------------------------------------------------- Debridement Details Patient Name: Date of Service: [name], [name] 04/21/2023 2:00 PM Medical Record Number: [id number] Patient Account Number: [id number] Date of Birth/Sex: Treating RN: 09/10/1940 (82 y.o. [name] Primary Care Provider: [name] Other Clinician: Referring Provider: Treating Provider/Extender: [name] Weeks in Treatment: 20 Debridement Performed for Assessment: Wound #1 Left,Medial Lower Leg Performed By: Physician Duanne Guess, MD The following information was scribed by: Zenaida Deed The information was scribed for: Duanne Guess Debridement Type: Debridement Severity of Tissue Pre Debridement: Fat layer exposed Level of Consciousness (Pre-procedure): Awake and Alert Pre-procedure Verification/Time Out Yes - 14:50 Taken: Start Time: 14:52 Pain Control: Lidocaine 4% Topical Solution Percent of Wound Bed Debrided: 100% T Area Debrided (cm): otal 0.21 Tissue and other material debrided: Non-Viable, Eschar, Slough, Slough Level: Non-Viable Tissue Debridement  Description: Selective/Open Wound Instrument: Curette Bleeding: Minimum Hemostasis Achieved: Pressure Procedural Pain: 0 Post Procedural Pain: 0 Response to Treatment: Procedure was tolerated well Level of Consciousness (Post- Awake and Alert procedure): Post Debridement Measurements of Total Wound Length: (cm) 0.9 Width: (cm) 0.3 Depth: (cm) 0.1 Volume: (cm) 0.021 LACHLYN, VANDERSTELT ([id number]) [id number] Page 2 of 11 Character of Wound/Ulcer Post Debridement: Improved Severity of Tissue Post Debridement: Fat layer exposed Post Procedure Diagnosis Same as Pre-procedure Electronic Signature(s) Signed: 04/21/2023 4:01:24 PM By: Zenaida Deed RN, BSN Signed: 04/21/2023 4:15:27 PM By: Duanne Guess MD FACS Entered By: Zenaida Deed on 04/21/2023 11:56:20 -------------------------------------------------------------------------------- Debridement Details Patient Name: Date of Service: [name], [name] 04/21/2023 2:00 PM Medical Record Number: [id number] Patient Account Number: [id number] Date of Birth/Sex: Treating RN: 25-Jan-1941 (82 y.o. [name] Primary Care Provider: [name] Other Clinician: Referring Provider: Treating Provider/Extender: [name] Weeks in Treatment: 20 Debridement Performed for Assessment: Wound #11 Left,Posterior Lower Leg Performed By: Physician Duanne Guess, MD Debridement Type: Debridement Severity of Tissue Pre Debridement: Fat layer exposed Level of Consciousness (Pre-procedure): Awake and Alert Pre-procedure Verification/Time Out Yes - 14:50 Taken: Start Time: 14:52 Pain Control: Lidocaine 4% T opical Solution Percent of Wound Bed Debrided: 100% T Area Debrided (cm): otal 0.71 Tissue and other material debrided: Non-Viable, Slough, Slough Level: Non-Viable Tissue Debridement Description: Selective/Open Wound Instrument: Curette Bleeding:  Minimum Hemostasis Achieved: Pressure Procedural Pain: 0 Post Procedural Pain: 0 Response to Treatment: Procedure was tolerated well Level of Consciousness (Post- Awake and Alert procedure): Post Debridement Measurements of Total Wound Length: (cm) 0.7 Width: (cm) 1.3 Depth: (cm) 0.1 Volume: (cm) 0.071 Character of Wound/Ulcer Post Debridement: Improved Severity of Tissue Post Debridement: Fat layer exposed Post Procedure Diagnosis Same as Pre-procedure Electronic Signature(s) Signed: 04/21/2023 4:01:24 PM By: Zenaida Deed RN, BSN Signed: 04/21/2023 4:15:27 PM By: Duanne Guess MD FACS Entered By: Zenaida Deed on 04/21/2023 11:56:58 [name] ([id number]) [id number] Page 3 of 11 --------------------------------------------------------------------------------  HPI Details Patient Name: Date of Service: [name], [name] RBA RA N. 04/21/2023 2:00 PM Medical Record Number: [id number] Patient Account Number: [id number] Date of Birth/Sex: Treating RN: 31-May-1941 (82 y.o. F) Primary Care Provider: [name] Other Clinician: Referring Provider: Treating Provider/Extender: [name] Weeks in Treatment: 20 History of Present Illness HPI Description: ADMISSION 11/26/2022 This is an 82 year old woman who was referred by her primary care provider for a persistent nonhealing wound on her left lower extremity. It has been present since February of this year. 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 I do not see any formal venous reflux studies in the electronic medical record. She is not diabetic. She does not smoke. Her PCP has been washing her leg each week and applying a Kerlix and Coban wrap, but has not performed any formal debridement and has not been using any sort of topical contact layer or ointment. ABI in clinic was  noncompressible, but she has a palpable pedal pulse. 12/10/2022: The wound is a bit smaller and cleaner today. There is still fairly heavy slough on the surface. Edema control is acceptable. 12/16/2022: The wound is measuring the slightest bit smaller today. There is still thick slough on the surface. Edema control is good. 12/23/2022: The wound is a little bit smaller again today. Still with fairly heavy slough accumulation. Edema control is good. She has a new small wound on the proximal portion of the same leg. It looks as though she may have developed a small blister over the weekend subsequently ruptured. There is minimal slough on the surface. 12/30/2022: The wound is about the same size, but substantially cleaner. There is some hypertrophic granulation tissue starting to emerge. The wound that was new last week has closed but she has a different new wound today on the lateral aspect of her left lower leg. It also appears to have been a blister that opened and ruptured. There is a little bit of slough on the surface. 01/06/2023: She has new wounds today. She has a wound on the PIP knuckle of her left third and fourth toes, as well as an anterior tibial wound. She says that the wrap was too tight and it also looks like when she pushes her foot into her shoe, it shifts the wrapping back to her midfoot causing bunching up of the wrap material and pain. The lateral leg wounds are both smaller with a little slough and eschar present. The original medial leg wound is smaller and more superficial. There is slough on the surface. 01/13/2023: She has new wounds today. The anterior tibial wound has a satellite lesion that has opened up adjacent to it. Has additional wounds on her fifth toe in addition to the existing ones on her third and fourth toe. The lateral leg wounds seem to have healed. The original medial leg wound is also smaller, but she and her daughter have several questions regarding why we are not  making forward progress, all of which are very reasonable. 01/20/2023: The wounds on her toes have healed. There is a tiny wound on the dorsum of her foot with a little bit of slough on it. The anterolateral leg wound has epithelialized quite substantially and the satellite that had opened last week is now closed. The medial leg wound measured narrower today. It has hypertrophic granulation tissue and slough present. 01/27/2023: The small wounds have all healed.  No; Support System Lacking: No; Transportation Concerns: No Electronic Signature(s) Signed: 04/21/2023 4:15:27 PM By: Duanne Guess MD FACS Entered By: Duanne Guess on 04/21/2023 12:18:29 [name] ([id number]) [id number] Page 11 of 11 -------------------------------------------------------------------------------- SuperBill Details Patient Name: Date of Service: [name], [name] 04/21/2023 Medical Record Number: [id number] Patient Account Number: [id number] Date of Birth/Sex: Treating RN: 1941-01-09 (82 y.o. 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 I87.332 Chronic venous hypertension (idiopathic) with ulcer and inflammation of left lower extremity I10 Essential (primary) hypertension Facility Procedures : CPT4 Code: [id number] Description: 97597 - DEBRIDE WOUND 1ST 20 SQ CM OR < ICD-10 Diagnosis Description L97.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 Modifier: Quantity: 1 Physician Procedures : CPT4 Code Description Modifier [id number] - WC PHYS LEVEL 4 - EST PT 25 ICD-10 Diagnosis Description L97.822 Non-pressure chronic ulcer of other part of left lower leg with fat layer exposed L97.222 Non-pressure chronic ulcer of left calf with fat  layer exposed I87.332 Chronic venous hypertension (idiopathic) with ulcer and inflammation of left lower extremity I10 Essential (primary) hypertension Quantity: 1 : [id number] 97597 - WC PHYS DEBR WO ANESTH 20 SQ CM ICD-10 Diagnosis Description L97.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 02/03/2023: The only remaining open wound is the left medial leg wound. It is smaller and a bit more superficial. There is still slough accumulation on the surface. No hypertrophic granulation tissue. She had both her venous reflux and lower extremity arterial studies, both of which were essentially normal, although her TBI's are slightly diminished. 02/10/2023: The left medial leg wound has some granulation tissue filling in, but overall, it is still fairly fibrotic. There is some slough on the surface today. 02/18/2023: The moisture balance and surface consistency have both improved. There is some slough over granulation tissue. Edema control is good. 02/24/2023: The wound measured smaller today. Moisture balance is good. Slough has reaccumulated. Edema remains well-controlled. 03/03/2023: The wound is a little bit smaller today. Moisture balance is stable. Minimal slough accumulation. There is more granulation tissue filling in over the fibrotic surface. Edema is well-controlled. 03/10/2023: The wound is smaller again today. Moisture balance is good. Light slough accumulation on the fibrotic surface. Edema is well-controlled. 03/17/2023: The wound is slightly smaller again today. There is minimal slough accumulation but the underlying surface remains quite fibrotic. Edema control is good. 03/24/2023: Although the  wound measurements are the same today, visually it looks smaller. There is still a fairly fibrotic surface underneath and slough. Edema remains well-controlled. 03/31/2023: She has a new wound that was identified when her wraps and dressings were removed today. It is just to the midline of the anterior tibial surface. The fat layer is exposed. There is a little bit of eschar and dry skin along with some thin slough. The wound closer to her ankle measured smaller today. The surface continues to improve although portions of it still remain fibrotic. There is slough buildup present. 04/07/2023: The wound that was new last week has nearly closed, but remains open by perhaps about a millimeter. The wound closer to her ankle had no significant change based on measurements, but visually it looks smaller to me. It is a little bit dry today and the surface is still fibrotic. 04/14/2023: The more proximal wound has completely healed. The wound at her ankle is quite a bit smaller with substantial epithelialization. There is just a small open portion of the distal aspect. It is covered with slough and eschar. 04/21/2023: The ankle wound is just the slightest bit smaller but it is shallower. There is slough and eschar accumulation. She has a new wound on her left posteromedial calf. It looks as though there was some friction or pinching, potentially from the Ace bandage. There is a bit of slough on the surface as well as some old blood. Electronic Signature(s) Signed: 04/21/2023 3:17:33 PM By: Duanne Guess MD FACS Entered By: Duanne Guess on 04/21/2023 12:17:32 [name] ([id number]) [id number] Page 4 of 11 -------------------------------------------------------------------------------- Physical Exam Details Patient Name: Date of Service: [name], [name] 04/21/2023 2:00 PM Medical Record Number: [id number] Patient Account Number: [id number] Date of Birth/Sex:  Treating RN: 1940/12/15 (82 y.o. F) Primary Care Provider: [name] Other Clinician: Referring Provider: Treating Provider/Extender: [name], [name] in Treatment: 20 Constitutional Hypertensive, asymptomatic. Slightly bradycardic. . . no acute distress. Respiratory Normal work of breathing on room air. Notes 04/21/2023: The ankle wound is just the slightest bit smaller but it is shallower. There is slough and eschar accumulation. She has a new wound on her left  posteromedial calf. It looks as though there was some friction or pinching, potentially from the Ace bandage. There is a bit of slough on the surface as well as some old blood. Electronic Signature(s) Signed: 04/21/2023 3:18:58 PM By: Duanne Guess MD FACS Entered By: Duanne Guess on 04/21/2023 12:18:57 -------------------------------------------------------------------------------- Physician Orders Details Patient Name: Date of Service: [name], [name] 04/21/2023 2:00 PM Medical Record Number: [id number] Patient Account Number: [id number] Date of Birth/Sex: Treating RN: September 20, 1940 (82 y.o. 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 - Dr. Lady Gary - room 1 Return A Tuesday 10/22 @10 :45 am Anesthetic (In clinic)  Topical Lidocaine 4% applied to wound bed Bathing/ Shower/ Hygiene May shower with protection but do not get wound dressing(s) wet. Protect dressing(s) with water repellant cover (for example, large plastic bag) or a cast cover and may then take shower. Edema Control - Lymphedema / SCD / Other Elevate legs to the level of the heart or above for 30 minutes daily and/or when sitting for 3-4 times a day throughout the day. VENESHA, PETRAITIS ([id number]) [id number] Page 5 of 11 Avoid standing for long periods of time. Exercise regularly Wound Treatment Wound #1 - Lower Leg Wound Laterality: Left, Medial Cleanser: Soap and Water 1 x Per Week/30 Days Discharge Instructions: May shower and wash wound with dial antibacterial soap and water prior to dressing change. Cleanser: Wound Cleanser 1 x Per Week/30 Days Discharge Instructions: Cleanse the wound with wound cleanser prior to applying a clean dressing using gauze sponges, not tissue or cotton balls. Peri-Wound Care: Sween Lotion (Moisturizing lotion) 1 x Per Week/30 Days Discharge Instructions: Apply moisturizing lotion as directed Topical: Gentamicin 1 x Per Week/30 Days Discharge Instructions: As directed by physician Topical: Mupirocin Ointment 1 x Per Week/30 Days Discharge Instructions: Apply Mupirocin (Bactroban) as instructed Topical: Skintegrity Hydrogel 4 (oz) 1 x Per Week/30 Days Discharge Instructions: Apply hydrogel as directed Secondary Dressing: Optifoam Non-Adhesive Dressing, 4x4 in 1 x Per Week/30 Days Discharge Instructions: Apply over primary dressing as directed. Compression Wrap: Kerlix Roll 4.5x3.1 (in/yd) 1 x Per Week/30 Days Discharge Instructions: Apply Kerlix and Coban compression as directed. Compression Wrap: Coban Self-Adherent Wrap 4x5 (in/yd) 1 x Per Week/30 Days Discharge Instructions: Apply over Kerlix as directed. Wound #11 - Lower Leg Wound Laterality: Left, Posterior Peri-Wound  Care: Sween Lotion (Moisturizing lotion) 1 x Per Week Discharge Instructions: Apply moisturizing lotion as directed Prim Dressing: Maxorb Extra Calcium Alginate, 2x2 (in/in) 1 x Per Week ary Discharge Instructions: Apply to wound bed as instructed Secondary Dressing: Woven Gauze Sponge, Non-Sterile 4x4 in 1 x Per Week Discharge Instructions: Apply over primary dressing as directed. Compression Wrap: Kerlix Roll 4.5x3.1 (in/yd) 1 x Per Week Discharge Instructions: Apply Kerlix and Coban compression as directed. Compression Wrap: Coban Self-Adherent Wrap 4x5 (in/yd) 1 x Per Week Discharge Instructions: Apply over Kerlix as directed. Electronic Signature(s) Signed: 04/21/2023 4:15:27 PM By: Duanne Guess MD FACS Entered By: Duanne Guess  HPI Details Patient Name: Date of Service: [name], [name] RBA RA N. 04/21/2023 2:00 PM Medical Record Number: [id number] Patient Account Number: [id number] Date of Birth/Sex: Treating RN: 31-May-1941 (82 y.o. F) Primary Care Provider: [name] Other Clinician: Referring Provider: Treating Provider/Extender: [name] Weeks in Treatment: 20 History of Present Illness HPI Description: ADMISSION 11/26/2022 This is an 82 year old woman who was referred by her primary care provider for a persistent nonhealing wound on her left lower extremity. It has been present since February of this year. 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 I do not see any formal venous reflux studies in the electronic medical record. She is not diabetic. She does not smoke. Her PCP has been washing her leg each week and applying a Kerlix and Coban wrap, but has not performed any formal debridement and has not been using any sort of topical contact layer or ointment. ABI in clinic was  noncompressible, but she has a palpable pedal pulse. 12/10/2022: The wound is a bit smaller and cleaner today. There is still fairly heavy slough on the surface. Edema control is acceptable. 12/16/2022: The wound is measuring the slightest bit smaller today. There is still thick slough on the surface. Edema control is good. 12/23/2022: The wound is a little bit smaller again today. Still with fairly heavy slough accumulation. Edema control is good. She has a new small wound on the proximal portion of the same leg. It looks as though she may have developed a small blister over the weekend subsequently ruptured. There is minimal slough on the surface. 12/30/2022: The wound is about the same size, but substantially cleaner. There is some hypertrophic granulation tissue starting to emerge. The wound that was new last week has closed but she has a different new wound today on the lateral aspect of her left lower leg. It also appears to have been a blister that opened and ruptured. There is a little bit of slough on the surface. 01/06/2023: She has new wounds today. She has a wound on the PIP knuckle of her left third and fourth toes, as well as an anterior tibial wound. She says that the wrap was too tight and it also looks like when she pushes her foot into her shoe, it shifts the wrapping back to her midfoot causing bunching up of the wrap material and pain. The lateral leg wounds are both smaller with a little slough and eschar present. The original medial leg wound is smaller and more superficial. There is slough on the surface. 01/13/2023: She has new wounds today. The anterior tibial wound has a satellite lesion that has opened up adjacent to it. Has additional wounds on her fifth toe in addition to the existing ones on her third and fourth toe. The lateral leg wounds seem to have healed. The original medial leg wound is also smaller, but she and her daughter have several questions regarding why we are not  making forward progress, all of which are very reasonable. 01/20/2023: The wounds on her toes have healed. There is a tiny wound on the dorsum of her foot with a little bit of slough on it. The anterolateral leg wound has epithelialized quite substantially and the satellite that had opened last week is now closed. The medial leg wound measured narrower today. It has hypertrophic granulation tissue and slough present. 01/27/2023: The small wounds have all healed.  posteromedial calf. It looks as though there was some friction or pinching, potentially from the Ace bandage. There is a bit of slough on the surface as well as some old blood. Electronic Signature(s) Signed: 04/21/2023 3:18:58 PM By: Duanne Guess MD FACS Entered By: Duanne Guess on 04/21/2023 12:18:57 -------------------------------------------------------------------------------- Physician Orders Details Patient Name: Date of Service: [name], [name] 04/21/2023 2:00 PM Medical Record Number: [id number] Patient Account Number: [id number] Date of Birth/Sex: Treating RN: September 20, 1940 (82 y.o. 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 - Dr. Lady Gary - room 1 Return A Tuesday 10/22 @10 :45 am Anesthetic (In clinic)  Topical Lidocaine 4% applied to wound bed Bathing/ Shower/ Hygiene May shower with protection but do not get wound dressing(s) wet. Protect dressing(s) with water repellant cover (for example, large plastic bag) or a cast cover and may then take shower. Edema Control - Lymphedema / SCD / Other Elevate legs to the level of the heart or above for 30 minutes daily and/or when sitting for 3-4 times a day throughout the day. VENESHA, PETRAITIS ([id number]) [id number] Page 5 of 11 Avoid standing for long periods of time. Exercise regularly Wound Treatment Wound #1 - Lower Leg Wound Laterality: Left, Medial Cleanser: Soap and Water 1 x Per Week/30 Days Discharge Instructions: May shower and wash wound with dial antibacterial soap and water prior to dressing change. Cleanser: Wound Cleanser 1 x Per Week/30 Days Discharge Instructions: Cleanse the wound with wound cleanser prior to applying a clean dressing using gauze sponges, not tissue or cotton balls. Peri-Wound Care: Sween Lotion (Moisturizing lotion) 1 x Per Week/30 Days Discharge Instructions: Apply moisturizing lotion as directed Topical: Gentamicin 1 x Per Week/30 Days Discharge Instructions: As directed by physician Topical: Mupirocin Ointment 1 x Per Week/30 Days Discharge Instructions: Apply Mupirocin (Bactroban) as instructed Topical: Skintegrity Hydrogel 4 (oz) 1 x Per Week/30 Days Discharge Instructions: Apply hydrogel as directed Secondary Dressing: Optifoam Non-Adhesive Dressing, 4x4 in 1 x Per Week/30 Days Discharge Instructions: Apply over primary dressing as directed. Compression Wrap: Kerlix Roll 4.5x3.1 (in/yd) 1 x Per Week/30 Days Discharge Instructions: Apply Kerlix and Coban compression as directed. Compression Wrap: Coban Self-Adherent Wrap 4x5 (in/yd) 1 x Per Week/30 Days Discharge Instructions: Apply over Kerlix as directed. Wound #11 - Lower Leg Wound Laterality: Left, Posterior Peri-Wound  Care: Sween Lotion (Moisturizing lotion) 1 x Per Week Discharge Instructions: Apply moisturizing lotion as directed Prim Dressing: Maxorb Extra Calcium Alginate, 2x2 (in/in) 1 x Per Week ary Discharge Instructions: Apply to wound bed as instructed Secondary Dressing: Woven Gauze Sponge, Non-Sterile 4x4 in 1 x Per Week Discharge Instructions: Apply over primary dressing as directed. Compression Wrap: Kerlix Roll 4.5x3.1 (in/yd) 1 x Per Week Discharge Instructions: Apply Kerlix and Coban compression as directed. Compression Wrap: Coban Self-Adherent Wrap 4x5 (in/yd) 1 x Per Week Discharge Instructions: Apply over Kerlix as directed. Electronic Signature(s) Signed: 04/21/2023 4:15:27 PM By: Duanne Guess MD FACS Entered By: Duanne Guess  on 04/21/2023 12:19:47 -------------------------------------------------------------------------------- Problem List Details Patient Name: Date of Service: [name], [name] RBA RA N. 04/21/2023 2:00 PM Medical Record Number: [id number] Patient Account Number: [id number] Date of Birth/Sex: Treating RN: 11/26/1940 (82 y.o. [name] Primary Care Provider: [name] Other Clinician: Referring Provider: Treating Provider/Extender: [name] Weeks in Treatment: 20 Active Problems ICD-10 Encounter Code Description Active Date MDM SHAVAWN, STOBAUGH ([id number]) [id number] Page 6 of 11 Code Description Active Date MDM Diagnosis L97.822 Non-pressure chronic ulcer of other part of left lower leg with fat layer 11/26/2022 No Yes exposed L97.222 Non-pressure chronic ulcer of left calf with fat layer exposed 04/21/2023 No Yes I87.332 Chronic venous hypertension (idiopathic) with ulcer and inflammation of left 11/26/2022 No Yes lower extremity I10 Essential (primary) hypertension 11/26/2022 No Yes Inactive Problems Resolved Problems ICD-10 Code Description Active Date Resolved Date L97.522  Non-pressure chronic ulcer of other part of left foot with fat layer exposed 01/13/2023 01/13/2023 Electronic Signature(s) Signed: 04/21/2023 3:14:46 PM By: Duanne Guess MD FACS Entered By: Duanne Guess on 04/21/2023 12:14:45 -------------------------------------------------------------------------------- Progress Note Details Patient Name: Date of Service: [name], [name] 04/21/2023 2:00 PM Medical Record Number: [id number] Patient Account Number: [id number] Date of Birth/Sex: Treating RN: [birth year] (82 y.o. F) Primary Care Provider: [name] Other Clinician: Referring Provider: Treating Provider/Extender: [name] Weeks in Treatment: 20 Subjective Chief Complaint Information obtained from Patient Patient presents for treatment of an open ulcer due to venous insufficiency History of Present Illness (HPI) ADMISSION 11/26/2022 This is an 82 year old woman who was referred by her primary care provider for a persistent nonhealing wound on her left lower extremity. It has been present since February of this year. 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 I do not see any formal venous reflux studies in the electronic medical record. She is not diabetic. She does not smoke. Her PCP has been washing her leg each week and applying a Kerlix and Coban wrap, but has not performed any formal debridement and has not been using any sort of topical contact layer or ointment. ABI in clinic was noncompressible, but she has a palpable pedal pulse. 12/10/2022: The wound is a bit smaller and cleaner today. There is still fairly heavy slough on the surface. Edema control is acceptable. 12/16/2022: The wound is measuring the slightest bit smaller today. There is still thick slough on the surface. Edema control is good. 12/23/2022: The wound is a little bit smaller again  today. Still with fairly heavy slough accumulation. Edema control is good. She has a new small wound on the proximal portion of the same leg. It looks as though she may have developed a small blister over the weekend subsequently ruptured. There is minimal slough on the surface. 12/30/2022: The wound is about the same size, but substantially cleaner. There is some hypertrophic granulation tissue starting to emerge. The wound that was new last week has closed but she has a different new wound today on the lateral aspect of her left lower leg. It also appears to have been a blister that opened and ruptured. There is a little bit of slough on the surface. MISAO, FACKRELL ([id number]) [id number] Page 7 of 11 01/06/2023: She has new wounds today. She has a wound on the PIP knuckle of her left third and fourth toes, as well as an anterior tibial  HPI Details Patient Name: Date of Service: [name], [name] RBA RA N. 04/21/2023 2:00 PM Medical Record Number: [id number] Patient Account Number: [id number] Date of Birth/Sex: Treating RN: 31-May-1941 (82 y.o. F) Primary Care Provider: [name] Other Clinician: Referring Provider: Treating Provider/Extender: [name] Weeks in Treatment: 20 History of Present Illness HPI Description: ADMISSION 11/26/2022 This is an 82 year old woman who was referred by her primary care provider for a persistent nonhealing wound on her left lower extremity. It has been present since February of this year. 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 I do not see any formal venous reflux studies in the electronic medical record. She is not diabetic. She does not smoke. Her PCP has been washing her leg each week and applying a Kerlix and Coban wrap, but has not performed any formal debridement and has not been using any sort of topical contact layer or ointment. ABI in clinic was  noncompressible, but she has a palpable pedal pulse. 12/10/2022: The wound is a bit smaller and cleaner today. There is still fairly heavy slough on the surface. Edema control is acceptable. 12/16/2022: The wound is measuring the slightest bit smaller today. There is still thick slough on the surface. Edema control is good. 12/23/2022: The wound is a little bit smaller again today. Still with fairly heavy slough accumulation. Edema control is good. She has a new small wound on the proximal portion of the same leg. It looks as though she may have developed a small blister over the weekend subsequently ruptured. There is minimal slough on the surface. 12/30/2022: The wound is about the same size, but substantially cleaner. There is some hypertrophic granulation tissue starting to emerge. The wound that was new last week has closed but she has a different new wound today on the lateral aspect of her left lower leg. It also appears to have been a blister that opened and ruptured. There is a little bit of slough on the surface. 01/06/2023: She has new wounds today. She has a wound on the PIP knuckle of her left third and fourth toes, as well as an anterior tibial wound. She says that the wrap was too tight and it also looks like when she pushes her foot into her shoe, it shifts the wrapping back to her midfoot causing bunching up of the wrap material and pain. The lateral leg wounds are both smaller with a little slough and eschar present. The original medial leg wound is smaller and more superficial. There is slough on the surface. 01/13/2023: She has new wounds today. The anterior tibial wound has a satellite lesion that has opened up adjacent to it. Has additional wounds on her fifth toe in addition to the existing ones on her third and fourth toe. The lateral leg wounds seem to have healed. The original medial leg wound is also smaller, but she and her daughter have several questions regarding why we are not  making forward progress, all of which are very reasonable. 01/20/2023: The wounds on her toes have healed. There is a tiny wound on the dorsum of her foot with a little bit of slough on it. The anterolateral leg wound has epithelialized quite substantially and the satellite that had opened last week is now closed. The medial leg wound measured narrower today. It has hypertrophic granulation tissue and slough present. 01/27/2023: The small wounds have all healed.  Per Week/30 Days Discharge Instructions: Apply hydrogel as directed Secondary Dressing: Optifoam Non-Adhesive Dressing, 4x4 in 1 x Per Week/30 Days Discharge Instructions: Apply over primary dressing as directed. Com pression Wrap: Kerlix Roll 4.5x3.1 (in/yd) 1 x Per Week/30 Days Discharge Instructions: Apply Kerlix and Coban compression as directed. Com pression Wrap: Coban Self-Adherent Wrap 4x5 (in/yd) 1 x Per Week/30 Days Discharge Instructions: Apply over Kerlix as directed. WOUND #11: - Lower Leg Wound Laterality: Left, Posterior Peri-Wound Care: Sween Lotion (Moisturizing lotion) 1 x Per Week/ Discharge Instructions: Apply moisturizing lotion as directed Prim Dressing: Maxorb Extra Calcium Alginate, 2x2 (in/in) 1 x Per Week/ ary Discharge Instructions: Apply to wound bed as instructed Secondary Dressing: Woven Gauze Sponge, Non-Sterile 4x4 in 1 x Per Week/ Discharge Instructions: Apply over primary dressing as directed. Com pression Wrap: Kerlix Roll 4.5x3.1 (in/yd) 1 x Per Week/ Discharge Instructions: Apply Kerlix and  Coban compression as directed. Com pression Wrap: Coban Self-Adherent Wrap 4x5 (in/yd) 1 x Per Week/ Discharge Instructions: Apply over Kerlix as directed. 04/21/2023: The ankle wound is just the slightest bit smaller but it is shallower. There is slough and eschar accumulation. She has a new wound on her left posteromedial calf. It looks as though there was some friction or pinching, potentially from the Ace bandage. There is a bit of slough on the surface as well as some old blood. I used a curette to debride slough and eschar from the ankle wound and slough and old blood from the new wound on her calf. We will apply silver alginate to the new wound and apply a mixture of topical gentamicin and mupirocin to the ankle wound, followed by Prisma silver collagen and Optifoam to help maintain moisture. We are going to use a Kerlix and Coban wrap to see if this can minimize the slipping and pinching that seems to have created the new wound on her calf. Follow-up in 1 week. Electronic Signature(s) Signed: 04/21/2023 3:21:50 PM By: Duanne Guess MD FACS Entered By: Duanne Guess on 04/21/2023 12:21:50 -------------------------------------------------------------------------------- HxROS Details Patient Name: Date of Service: [name], [name] 04/21/2023 2:00 PM Medical Record Number: [id number] Patient Account Number: [id number] Date of Birth/Sex: Treating RN: 12/05/1940 (82 y.o. F) Primary Care Provider: [name] Other Clinician: Referring Provider: Treating Provider/Extender: [name], [name] in Treatment: 20 Eyes Medical History: Positive for: Cataracts Hematologic/Lymphatic Medical History: Past Medical History Notes: thrombocytopenia AHANA, NAJERA ([id number]) [id number] Page 10 of 11 Cardiovascular Medical History: Positive for: Arrhythmia - bundle branch block; Hypertension; Peripheral Venous  Disease Gastrointestinal Medical History: Past Medical History Notes: diverticulosis, GERD Endocrine Medical History: Past Medical History Notes: hypothyroidism Genitourinary Medical History: Past Medical History Notes: CKD stage III Musculoskeletal Medical History: Positive for: Gout; Osteoarthritis Past Medical History Notes: DJD, osteoporosis Oncologic Medical History: Past Medical History Notes: breast cancer HBO Extended History Items Eyes: Cataracts Immunizations Pneumococcal Vaccine: Received Pneumococcal Vaccination: Yes Received Pneumococcal Vaccination On or After 60th Birthday: No Implantable Devices No devices added Hospitalization / Surgery History Type of Hospitalization/Surgery Salpingoophorectomy Colon resection Appendectomy Cystoscopy w/ retrogrades Joint replacement Mastectomy right gastric ulcer repair Abdominal hysterectomy Family and Social History Cancer: Yes - Siblings; Diabetes: No; Heart Disease: No; Hereditary Spherocytosis: No; Hypertension: No; Kidney Disease: No; Lung Disease: Yes - Siblings; Seizures: No; Stroke: No; Thyroid Problems: No; Tuberculosis: No; Never smoker; Marital Status - Widowed; Alcohol Use: Never; Drug Use: No History; Caffeine Use: Moderate; Financial Concerns: No; Food, Clothing or Shelter Needs:

## 2023-04-28 ENCOUNTER — Encounter (HOSPITAL_BASED_OUTPATIENT_CLINIC_OR_DEPARTMENT_OTHER): Payer: Medicare HMO | Admitting: General Surgery

## 2023-04-28 DIAGNOSIS — M109 Gout, unspecified: Secondary | ICD-10-CM | POA: Diagnosis not present

## 2023-04-28 DIAGNOSIS — M1990 Unspecified osteoarthritis, unspecified site: Secondary | ICD-10-CM | POA: Diagnosis not present

## 2023-04-28 DIAGNOSIS — I872 Venous insufficiency (chronic) (peripheral): Secondary | ICD-10-CM | POA: Diagnosis not present

## 2023-04-28 DIAGNOSIS — L97822 Non-pressure chronic ulcer of other part of left lower leg with fat layer exposed: Secondary | ICD-10-CM | POA: Diagnosis not present

## 2023-04-28 DIAGNOSIS — Z90721 Acquired absence of ovaries, unilateral: Secondary | ICD-10-CM | POA: Diagnosis not present

## 2023-04-28 DIAGNOSIS — L97222 Non-pressure chronic ulcer of left calf with fat layer exposed: Secondary | ICD-10-CM | POA: Diagnosis not present

## 2023-04-28 DIAGNOSIS — I129 Hypertensive chronic kidney disease with stage 1 through stage 4 chronic kidney disease, or unspecified chronic kidney disease: Secondary | ICD-10-CM | POA: Diagnosis not present

## 2023-04-28 DIAGNOSIS — I87332 Chronic venous hypertension (idiopathic) with ulcer and inflammation of left lower extremity: Secondary | ICD-10-CM | POA: Diagnosis not present

## 2023-04-28 DIAGNOSIS — N183 Chronic kidney disease, stage 3 unspecified: Secondary | ICD-10-CM | POA: Diagnosis not present

## 2023-04-28 NOTE — Progress Notes (Signed)
Erin [name], Erin [name] ([id number]) [id number] Page 1 of 12 Visit Report for 04/28/2023 Chief Complaint Document Details Patient Name: Date of Service: [name], [name] RBA RA N. 04/28/2023 10:45 A M Medical Record Number: [id number] Patient Account Number: [id number] Date of Birth/Sex: Treating RN: [birth year] (82 y.o. F) Primary Care Provider: [name] Other Clinician: Referring Provider: Treating Provider/Extender: [name] Weeks in Treatment: 21 Information Obtained from: Patient Chief Complaint Patient presents for treatment of an open ulcer due to venous insufficiency Electronic Signature(s) Signed: 04/28/2023 11:18:45 AM By: Duanne Guess MD FACS Entered By: Duanne Guess on 04/28/2023 08:18:45 -------------------------------------------------------------------------------- Debridement Details Patient Name: Date of Service: [name], [name] 04/28/2023 10:45 A M Medical Record Number: [id number] Patient Account Number: [id number] Date of Birth/Sex: Treating RN: 07/06/1941 (82 y.o. Erin [name] Primary Care Provider: [name] Other Clinician: Referring Provider: Treating Provider/Extender: [name] Weeks in Treatment: 21 Debridement Performed for Assessment: Wound #1 Left,Medial Lower Leg Performed By: Physician Duanne Guess, MD The following information was scribed by: Samuella Bruin The information was scribed for: Duanne Guess Debridement Type: Debridement Severity of Tissue Pre Debridement: Fat layer exposed Level of Consciousness (Pre-procedure): Awake and Alert Pre-procedure Verification/Time Out Yes - 11:05 Taken: Start Time: 11:05 Pain Control: Lidocaine 4% T opical Solution Percent of Wound Bed Debrided: 100% T Area Debrided (cm): otal 0.21 Tissue and other material debrided: Non-Viable, Slough, Slough Level: Non-Viable  Tissue Debridement Description: Selective/Open Wound Instrument: Curette Bleeding: Minimum Hemostasis Achieved: Pressure Response to Treatment: Procedure was tolerated well Level of Consciousness (Post- Awake and Alert procedure): Post Debridement Measurements of Total Wound Length: (cm) 0.9 Width: (cm) 0.3 Depth: (cm) 0.1 Volume: (cm) 0.021 Character of Wound/Ulcer Post Debridement: Improved Severity of Tissue Post Debridement: Fat layer exposed Erin [name], Erin [name] ([id number]) [id number] Page 2 of 12 Post Procedure Diagnosis Same as Pre-procedure Electronic Signature(s) Signed: 04/28/2023 12:41:40 PM By: Duanne Guess MD FACS Signed: 04/28/2023 3:45:49 PM By: Samuella Bruin Entered By: Samuella Bruin on 04/28/2023 08:06:11 -------------------------------------------------------------------------------- Debridement Details Patient Name: Date of Service: [name], [name] 04/28/2023 10:45 A M Medical Record Number: [id number] Patient Account Number: [id number] Date of Birth/Sex: Treating RN: [birth year] (82 y.o. Erin [name] Primary Care Provider: [name] Other Clinician: Referring Provider: Treating Provider/Extender: [name] Weeks in Treatment: 21 Debridement Performed for Assessment: Wound #11 Left,Posterior Lower Leg Performed By: Physician Duanne Guess, MD The following information was scribed by: Samuella Bruin The information was scribed for: Duanne Guess Debridement Type: Debridement Severity of Tissue Pre Debridement: Fat layer exposed Level of Consciousness (Pre-procedure): Awake and Alert Pre-procedure Verification/Time Out Yes - 11:05 Taken: Start Time: 11:05 Pain Control: Lidocaine 4% Topical Solution Percent of Wound Bed Debrided: 100% T Area Debrided (cm): otal 0.01 Tissue and other material debrided: Non-Viable, Eschar, Slough, Skin: Epidermis,  Slough Level: Skin/Epidermis Debridement Description: Selective/Open Wound Instrument: Curette Bleeding: Minimum Hemostasis Achieved: Pressure Response to Treatment: Procedure was tolerated well Level of Consciousness (Post- Awake and Alert procedure): Post Debridement Measurements of Total Wound Length: (cm) 0.1 Width: (cm) 0.1 Depth: (cm) 0.1 Volume: (cm) 0.001 Character of Wound/Ulcer Post Debridement: Improved Severity of Tissue Post Debridement: Fat layer exposed Post Procedure Diagnosis Same as Pre-procedure Electronic Signature(s) Signed: 04/28/2023 12:41:40 PM By: Duanne Guess MD FACS Signed: 04/28/2023 3:45:49 PM By: Samuella Bruin Entered By: Samuella Bruin on 04/28/2023 08:06:57 HPI Details -------------------------------------------------------------------------------- Erin [name] ([id number]) [id number] Page  3 of 12 Patient Name: Date of Service: [name], [name] RBA RA N. 04/28/2023 10:45 A M Medical Record Number: [id number] Patient Account Number: [id number] Date of Birth/Sex: Treating RN: 05-09-1941 (82 y.o. F) Primary Care Provider: [name] Other Clinician: Referring Provider: Treating Provider/Extender: [name] Weeks in Treatment: 21 History of Present Illness HPI Description: ADMISSION 11/26/2022 This is an 82 year old woman who was referred by her primary care provider for a persistent nonhealing wound on her left lower extremity. It has been present since February of this year. 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 I do not see any formal venous reflux studies in the electronic medical record. She is not diabetic. She does not smoke. Her PCP has been washing her leg each week and applying a Kerlix and Coban wrap, but has not performed any formal debridement and has not been using  any sort of topical contact layer or ointment. ABI in clinic was noncompressible, but she has a palpable pedal pulse. 12/10/2022: The wound is a bit smaller and cleaner today. There is still fairly heavy slough on the surface. Edema control is acceptable. 12/16/2022: The wound is measuring the slightest bit smaller today. There is still thick slough on the surface. Edema control is good. 12/23/2022: The wound is a little bit smaller again today. Still with fairly heavy slough accumulation. Edema control is good. She has a new small wound on the proximal portion of the same leg. It looks as though she may have developed a small blister over the weekend subsequently ruptured. There is minimal slough on the surface. 12/30/2022: The wound is about the same size, but substantially cleaner. There is some hypertrophic granulation tissue starting to emerge. The wound that was new last week has closed but she has a different new wound today on the lateral aspect of her left lower leg. It also appears to have been a blister that opened and ruptured. There is a little bit of slough on the surface. 01/06/2023: She has new wounds today. She has a wound on the PIP knuckle of her left third and fourth toes, as well as an anterior tibial wound. She says that the wrap was too tight and it also looks like when she pushes her foot into her shoe, it shifts the wrapping back to her midfoot causing bunching up of the wrap material and pain. The lateral leg wounds are both smaller with a little slough and eschar present. The original medial leg wound is smaller and more superficial. There is slough on the surface. 01/13/2023: She has new wounds today. The anterior tibial wound has a satellite lesion that has opened up adjacent to it. Has additional wounds on her fifth toe in addition to the existing ones on her third and fourth toe. The lateral leg wounds seem to have healed. The original medial leg wound is also smaller, but she  and her daughter have several questions regarding why we are not making forward progress, all of which are very reasonable. 01/20/2023: The wounds on her toes have healed. There is a tiny wound on the dorsum of her foot with a little bit of slough on it. The anterolateral leg wound has epithelialized quite substantially and the satellite that had opened last week is now closed. The medial leg wound measured narrower today. It has hypertrophic granulation tissue and slough present. 01/27/2023: The small wounds have  to wound bed as instructed Secondary Dressing: Woven Gauze Sponge, Non-Sterile 4x4 in 1 x Per Week Discharge Instructions: Apply over primary dressing as directed. Compression Wrap: Kerlix Roll 4.5x3.1 (in/yd) 1 x Per Week Discharge Instructions: Apply Kerlix and Coban compression as directed. Compression Wrap: Coban Self-Adherent Wrap 4x5 (in/yd) 1 x Per Week Discharge Instructions: Apply over Kerlix as directed. Patient Medications llergies: codeine, morphine A Notifications Medication Indication Start End 04/28/2023 lidocaine DOSE topical 4 % cream - cream topical Electronic Signature(s) Signed: 04/28/2023 12:41:40 PM By: Duanne Guess MD FACS Entered By: Duanne Guess on 04/28/2023 08:24:52 Erin [name] ([id number]) [id number] Page 6 of 12 -------------------------------------------------------------------------------- Problem List Details Patient Name: Date of Service: [name], [name] RBA RA N. 04/28/2023 10:45 A M Medical Record Number: [id number] Patient Account Number: [id number] Date of Birth/Sex: Treating RN: [birth year] (82 y.o. F) Primary Care Provider: [name] Other Clinician: Referring  Provider: Treating Provider/Extender: [name] Weeks in Treatment: 21 Active Problems ICD-10 Encounter Code Description Active Date MDM Diagnosis L97.822 Non-pressure chronic ulcer of other part of left lower leg with fat layer 11/26/2022 No Yes exposed L97.222 Non-pressure chronic ulcer of left calf with fat layer exposed 04/21/2023 No Yes I87.332 Chronic venous hypertension (idiopathic) with ulcer and inflammation of left 11/26/2022 No Yes lower extremity I10 Essential (primary) hypertension 11/26/2022 No Yes Inactive Problems Resolved Problems ICD-10 Code Description Active Date Resolved Date L97.522 Non-pressure chronic ulcer of other part of left foot with fat layer exposed 01/13/2023 01/13/2023 Electronic Signature(s) Signed: 04/28/2023 11:18:26 AM By: Duanne Guess MD FACS Entered By: Duanne Guess on 04/28/2023 08:18:26 -------------------------------------------------------------------------------- Progress Note Details Patient Name: Date of Service: [name], [name] 04/28/2023 10:45 A M Medical Record Number: [id number] Patient Account Number: [id number] Date of Birth/Sex: Treating RN: [birth year] (82 y.o. F) Primary Care Provider: [name] Other Clinician: Referring Provider: Treating Provider/Extender: [name] Weeks in Treatment: 21 Subjective Chief Complaint Information obtained from Patient Erin [name], Erin [name] ([id number]) [id number] Page 7 of 12 Patient presents for treatment of an open ulcer due to venous insufficiency History of Present Illness (HPI) ADMISSION 11/26/2022 This is an 82 year old woman who was referred by her primary care provider for a persistent nonhealing wound on her left lower extremity. It has been present since February of this year. 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 I do not see any formal venous reflux studies in the electronic medical record. She is not diabetic. She does not smoke. Her PCP has been washing her leg each week and applying a Kerlix and Coban wrap, but has not performed any formal debridement and has not been using any sort of topical contact layer or ointment. ABI in clinic was noncompressible, but she has a palpable pedal pulse. 12/10/2022: The wound is a bit smaller and cleaner today. There is still fairly heavy slough on the surface. Edema control is acceptable. 12/16/2022: The wound is measuring the slightest bit smaller today. There is still thick slough on the surface. Edema control is good. 12/23/2022: The wound is a little bit smaller again today. Still with fairly heavy slough accumulation. Edema control is good. She has a new small wound on the proximal portion of the same leg. It looks as though she may have developed a small blister over the weekend subsequently ruptured. There is minimal slough on the surface. 12/30/2022: The  to wound bed as instructed Secondary Dressing: Woven Gauze Sponge, Non-Sterile 4x4 in 1 x Per Week Discharge Instructions: Apply over primary dressing as directed. Compression Wrap: Kerlix Roll 4.5x3.1 (in/yd) 1 x Per Week Discharge Instructions: Apply Kerlix and Coban compression as directed. Compression Wrap: Coban Self-Adherent Wrap 4x5 (in/yd) 1 x Per Week Discharge Instructions: Apply over Kerlix as directed. Patient Medications llergies: codeine, morphine A Notifications Medication Indication Start End 04/28/2023 lidocaine DOSE topical 4 % cream - cream topical Electronic Signature(s) Signed: 04/28/2023 12:41:40 PM By: Duanne Guess MD FACS Entered By: Duanne Guess on 04/28/2023 08:24:52 Erin [name] ([id number]) [id number] Page 6 of 12 -------------------------------------------------------------------------------- Problem List Details Patient Name: Date of Service: [name], [name] RBA RA N. 04/28/2023 10:45 A M Medical Record Number: [id number] Patient Account Number: [id number] Date of Birth/Sex: Treating RN: [birth year] (82 y.o. F) Primary Care Provider: [name] Other Clinician: Referring  Provider: Treating Provider/Extender: [name] Weeks in Treatment: 21 Active Problems ICD-10 Encounter Code Description Active Date MDM Diagnosis L97.822 Non-pressure chronic ulcer of other part of left lower leg with fat layer 11/26/2022 No Yes exposed L97.222 Non-pressure chronic ulcer of left calf with fat layer exposed 04/21/2023 No Yes I87.332 Chronic venous hypertension (idiopathic) with ulcer and inflammation of left 11/26/2022 No Yes lower extremity I10 Essential (primary) hypertension 11/26/2022 No Yes Inactive Problems Resolved Problems ICD-10 Code Description Active Date Resolved Date L97.522 Non-pressure chronic ulcer of other part of left foot with fat layer exposed 01/13/2023 01/13/2023 Electronic Signature(s) Signed: 04/28/2023 11:18:26 AM By: Duanne Guess MD FACS Entered By: Duanne Guess on 04/28/2023 08:18:26 -------------------------------------------------------------------------------- Progress Note Details Patient Name: Date of Service: [name], [name] 04/28/2023 10:45 A M Medical Record Number: [id number] Patient Account Number: [id number] Date of Birth/Sex: Treating RN: [birth year] (82 y.o. F) Primary Care Provider: [name] Other Clinician: Referring Provider: Treating Provider/Extender: [name] Weeks in Treatment: 21 Subjective Chief Complaint Information obtained from Patient Erin [name], Erin [name] ([id number]) [id number] Page 7 of 12 Patient presents for treatment of an open ulcer due to venous insufficiency History of Present Illness (HPI) ADMISSION 11/26/2022 This is an 82 year old woman who was referred by her primary care provider for a persistent nonhealing wound on her left lower extremity. It has been present since February of this year. 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 I do not see any formal venous reflux studies in the electronic medical record. She is not diabetic. She does not smoke. Her PCP has been washing her leg each week and applying a Kerlix and Coban wrap, but has not performed any formal debridement and has not been using any sort of topical contact layer or ointment. ABI in clinic was noncompressible, but she has a palpable pedal pulse. 12/10/2022: The wound is a bit smaller and cleaner today. There is still fairly heavy slough on the surface. Edema control is acceptable. 12/16/2022: The wound is measuring the slightest bit smaller today. There is still thick slough on the surface. Edema control is good. 12/23/2022: The wound is a little bit smaller again today. Still with fairly heavy slough accumulation. Edema control is good. She has a new small wound on the proximal portion of the same leg. It looks as though she may have developed a small blister over the weekend subsequently ruptured. There is minimal slough on the surface. 12/30/2022: The  Hypertensive, asymptomatic. . . . no acute distress. Respiratory Normal work of breathing on room air. Notes 04/28/2023: The ankle wound did not change in size this week. There is a little slough on the surface. The wound on her left posteromedial calf is covered with a layer of eschar. Underneath, it is smaller and clean. Edema control is good. Electronic Signature(s) Signed: 04/28/2023 11:21:26 AM By: Duanne Guess MD FACS Entered By: Duanne Guess on 04/28/2023 08:21:25 -------------------------------------------------------------------------------- Physician Orders Details Patient Name: Date of Service: [name], [name] 04/28/2023 10:45 A M Medical Record Number: [id number] Patient Account Number: [id number] Date of Birth/Sex: Treating RN: 1941/07/01 (82 y.o. 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 - Dr. Lady Gary - room 1 Return A Anesthetic (In clinic) Topical Lidocaine 4% applied to wound bed Bathing/ Shower/ Hygiene May shower with protection but do not get wound dressing(s) wet. Protect dressing(s) with water repellant cover (for example, large plastic bag) or a cast cover and may then take shower. Edema Control - Lymphedema / SCD / Other Elevate legs to the level of the heart or above for 30 minutes daily and/or when sitting for 3-4 times a day throughout the day. Avoid standing for long periods of time. Erin [name], Erin [name] ([id number]) [id number] Page 5 of 12 Exercise regularly Wound Treatment Wound #1 - Lower Leg Wound Laterality: Left, Medial Cleanser: Soap and Water 1 x Per Week/30 Days Discharge Instructions: May shower and wash wound with dial antibacterial soap and water prior to dressing change. Cleanser: Wound Cleanser 1 x Per Week/30 Days Discharge Instructions: Cleanse the wound with wound cleanser prior to applying a clean dressing using gauze sponges, not tissue or cotton balls. Peri-Wound Care: Triamcinolone 15 (g) 1 x Per Week/30 Days Discharge Instructions: Use triamcinolone 15 (g) as directed Peri-Wound Care: Sween Lotion (Moisturizing lotion) 1 x Per Week/30 Days Discharge Instructions: Apply moisturizing lotion as directed Topical: Gentamicin 1 x Per Week/30 Days Discharge Instructions: As directed by physician Topical: Mupirocin Ointment 1 x Per Week/30 Days Discharge Instructions: Apply Mupirocin (Bactroban) as instructed Topical: Skintegrity Hydrogel 4 (oz) 1 x Per Week/30 Days Discharge Instructions: Apply hydrogel as directed Prim Dressing: Endoform 2x2 in 1 x Per Week/30 Days ary Discharge Instructions: Moisten with saline Secondary Dressing: Optifoam Non-Adhesive Dressing, 4x4 in 1 x Per Week/30  Days Discharge Instructions: Apply over primary dressing as directed. Compression Wrap: Kerlix Roll 4.5x3.1 (in/yd) 1 x Per Week/30 Days Discharge Instructions: Apply Kerlix and Coban compression as directed. Compression Wrap: Coban Self-Adherent Wrap 4x5 (in/yd) 1 x Per Week/30 Days Discharge Instructions: Apply over Kerlix as directed. Wound #11 - Lower Leg Wound Laterality: Left, Posterior Peri-Wound Care: Triamcinolone 15 (g) 1 x Per Week Discharge Instructions: Use triamcinolone 15 (g) as directed Peri-Wound Care: Sween Lotion (Moisturizing lotion) 1 x Per Week Discharge Instructions: Apply moisturizing lotion as directed Prim Dressing: Maxorb Extra Ag+ Alginate Dressing, 2x2 (in/in) 1 x Per Week ary Discharge Instructions: Apply  osteoporosis Oncologic Medical History: Past Medical History Notes: breast cancer HBO Extended History Items Eyes: Cataracts Immunizations Pneumococcal Vaccine: Received Pneumococcal Vaccination: Yes Received Pneumococcal Vaccination On or After 60th Birthday: No Implantable Devices No devices added Hospitalization / Surgery History Type of Hospitalization/Surgery ADREANNE, LEVAR ([id number]) [id number] Page 11 of 12 Salpingoophorectomy Colon resection Appendectomy Cystoscopy w/ retrogrades Joint replacement Mastectomy right gastric ulcer repair Abdominal hysterectomy Family and Social History Cancer: Yes - Siblings;  Diabetes: No; Heart Disease: No; Hereditary Spherocytosis: No; Hypertension: No; Kidney Disease: No; Lung Disease: Yes - Siblings; Seizures: No; Stroke: No; Thyroid Problems: No; Tuberculosis: No; Never smoker; Marital Status - Widowed; Alcohol Use: Never; Drug Use: No History; Caffeine Use: Moderate; Financial Concerns: No; Food, Clothing or Shelter Needs: No; Support System Lacking: No; Transportation Concerns: No Psychologist, prison and probation services) Signed: 04/28/2023 12:41:40 PM By: Duanne Guess MD FACS Entered By: Duanne Guess on 04/28/2023 08:20:11 -------------------------------------------------------------------------------- SuperBill Details Patient Name: Date of Service: [name], [name] 04/28/2023 Medical Record Number: [id number] Patient Account Number: [id number] Date of Birth/Sex: Treating RN: [birth year] (82 y.o. 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 I87.332 Chronic venous hypertension (idiopathic) with ulcer and inflammation of left lower extremity I10 Essential (primary) hypertension Facility Procedures : CPT4 Code: [id number] Description: 97597 - DEBRIDE WOUND 1ST 20 SQ CM OR < ICD-10 Diagnosis Description L97.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 Modifier: Quantity: 1 Physician Procedures : CPT4 Code Description Modifier [id number] - WC PHYS LEVEL 4 - EST PT 25 ICD-10 Diagnosis Description L97.822 Non-pressure chronic ulcer of other part of left lower leg with fat layer exposed L97.222 Non-pressure chronic ulcer of  left calf with fat  layer exposed I87.332 Chronic venous hypertension (idiopathic) with ulcer and inflammation of left lower extremity I10 Essential (primary) hypertension Quantity: 1 : [id number] - WC PHYS DEBR WO ANESTH 20 SQ CM ICD-10 Diagnosis Description L97.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 04/28/2023 10:45 A M Medical Record Number: [id number] Patient Account Number: [id number] Date of Birth/Sex: Treating RN: [birth year] (82 y.o. F) Primary Care Provider: [name] Other Clinician: Referring Provider: Treating Provider/Extender: [name] Weeks in Treatment: 21 Information Obtained from: Patient Chief Complaint Patient presents for treatment of an open ulcer due to venous insufficiency Electronic Signature(s) Signed: 04/28/2023 11:18:45 AM By: Duanne Guess MD FACS Entered By: Duanne Guess on 04/28/2023 08:18:45 -------------------------------------------------------------------------------- Debridement Details Patient Name: Date of Service: [name], [name] 04/28/2023 10:45 A M Medical Record Number: [id number] Patient Account Number: [id number] Date of Birth/Sex: Treating RN: 07/06/1941 (82 y.o. Erin [name] Primary Care Provider: [name] Other Clinician: Referring Provider: Treating Provider/Extender: [name] Weeks in Treatment: 21 Debridement Performed for Assessment: Wound #1 Left,Medial Lower Leg Performed By: Physician Duanne Guess, MD The following information was scribed by: Samuella Bruin The information was scribed for: Duanne Guess Debridement Type: Debridement Severity of Tissue Pre Debridement: Fat layer exposed Level of Consciousness (Pre-procedure): Awake and Alert Pre-procedure Verification/Time Out Yes - 11:05 Taken: Start Time: 11:05 Pain Control: Lidocaine 4% T opical Solution Percent of Wound Bed Debrided: 100% T Area Debrided (cm): otal 0.21 Tissue and other material debrided: Non-Viable, Slough, Slough Level: Non-Viable  Tissue Debridement Description: Selective/Open Wound Instrument: Curette Bleeding: Minimum Hemostasis Achieved: Pressure Response to Treatment: Procedure was tolerated well Level of Consciousness (Post- Awake and Alert procedure): Post Debridement Measurements of Total Wound Length: (cm) 0.9 Width: (cm) 0.3 Depth: (cm) 0.1 Volume: (cm) 0.021 Character of Wound/Ulcer Post Debridement: Improved Severity of Tissue Post Debridement: Fat layer exposed Erin [name], Erin [name] ([id number]) [id number] Page 2 of 12 Post Procedure Diagnosis Same as Pre-procedure Electronic Signature(s) Signed: 04/28/2023 12:41:40 PM By: Duanne Guess MD FACS Signed: 04/28/2023 3:45:49 PM By: Samuella Bruin Entered By: Samuella Bruin on 04/28/2023 08:06:11 -------------------------------------------------------------------------------- Debridement Details Patient Name: Date of Service: [name], [name] 04/28/2023 10:45 A M Medical Record Number: [id number] Patient Account Number: [id number] Date of Birth/Sex: Treating RN: [birth year] (82 y.o. Erin [name] Primary Care Provider: [name] Other Clinician: Referring Provider: Treating Provider/Extender: [name] Weeks in Treatment: 21 Debridement Performed for Assessment: Wound #11 Left,Posterior Lower Leg Performed By: Physician Duanne Guess, MD The following information was scribed by: Samuella Bruin The information was scribed for: Duanne Guess Debridement Type: Debridement Severity of Tissue Pre Debridement: Fat layer exposed Level of Consciousness (Pre-procedure): Awake and Alert Pre-procedure Verification/Time Out Yes - 11:05 Taken: Start Time: 11:05 Pain Control: Lidocaine 4% Topical Solution Percent of Wound Bed Debrided: 100% T Area Debrided (cm): otal 0.01 Tissue and other material debrided: Non-Viable, Eschar, Slough, Skin: Epidermis,  Slough Level: Skin/Epidermis Debridement Description: Selective/Open Wound Instrument: Curette Bleeding: Minimum Hemostasis Achieved: Pressure Response to Treatment: Procedure was tolerated well Level of Consciousness (Post- Awake and Alert procedure): Post Debridement Measurements of Total Wound Length: (cm) 0.1 Width: (cm) 0.1 Depth: (cm) 0.1 Volume: (cm) 0.001 Character of Wound/Ulcer Post Debridement: Improved Severity of Tissue Post Debridement: Fat layer exposed Post Procedure Diagnosis Same as Pre-procedure Electronic Signature(s) Signed: 04/28/2023 12:41:40 PM By: Duanne Guess MD FACS Signed: 04/28/2023 3:45:49 PM By: Samuella Bruin Entered By: Samuella Bruin on 04/28/2023 08:06:57 HPI Details -------------------------------------------------------------------------------- Erin [name] ([id number]) [id number] Page  osteoporosis Oncologic Medical History: Past Medical History Notes: breast cancer HBO Extended History Items Eyes: Cataracts Immunizations Pneumococcal Vaccine: Received Pneumococcal Vaccination: Yes Received Pneumococcal Vaccination On or After 60th Birthday: No Implantable Devices No devices added Hospitalization / Surgery History Type of Hospitalization/Surgery ADREANNE, LEVAR ([id number]) [id number] Page 11 of 12 Salpingoophorectomy Colon resection Appendectomy Cystoscopy w/ retrogrades Joint replacement Mastectomy right gastric ulcer repair Abdominal hysterectomy Family and Social History Cancer: Yes - Siblings;  Diabetes: No; Heart Disease: No; Hereditary Spherocytosis: No; Hypertension: No; Kidney Disease: No; Lung Disease: Yes - Siblings; Seizures: No; Stroke: No; Thyroid Problems: No; Tuberculosis: No; Never smoker; Marital Status - Widowed; Alcohol Use: Never; Drug Use: No History; Caffeine Use: Moderate; Financial Concerns: No; Food, Clothing or Shelter Needs: No; Support System Lacking: No; Transportation Concerns: No Psychologist, prison and probation services) Signed: 04/28/2023 12:41:40 PM By: Duanne Guess MD FACS Entered By: Duanne Guess on 04/28/2023 08:20:11 -------------------------------------------------------------------------------- SuperBill Details Patient Name: Date of Service: [name], [name] 04/28/2023 Medical Record Number: [id number] Patient Account Number: [id number] Date of Birth/Sex: Treating RN: [birth year] (82 y.o. 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 I87.332 Chronic venous hypertension (idiopathic) with ulcer and inflammation of left lower extremity I10 Essential (primary) hypertension Facility Procedures : CPT4 Code: [id number] Description: 97597 - DEBRIDE WOUND 1ST 20 SQ CM OR < ICD-10 Diagnosis Description L97.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 Modifier: Quantity: 1 Physician Procedures : CPT4 Code Description Modifier [id number] - WC PHYS LEVEL 4 - EST PT 25 ICD-10 Diagnosis Description L97.822 Non-pressure chronic ulcer of other part of left lower leg with fat layer exposed L97.222 Non-pressure chronic ulcer of  left calf with fat  layer exposed I87.332 Chronic venous hypertension (idiopathic) with ulcer and inflammation of left lower extremity I10 Essential (primary) hypertension Quantity: 1 : [id number] 97597 - WC PHYS DEBR WO ANESTH 20 SQ CM ICD-10 Diagnosis Description L97.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 Compression Wrap: Kerlix Roll 4.5x3.1 (in/yd) 1 x Per Week Discharge Instructions: Apply Kerlix and Coban compression as directed. Compression Wrap: Coban Self-Adherent Wrap 4x5 (in/yd) 1 x Per Week Discharge Instructions: Apply over Kerlix as directed. Patient Medications llergies: codeine, morphine A Notifications Medication Indication Start End 04/28/2023 lidocaine DOSE topical 4 % cream - cream topical Electronic Signature(s) Signed: 04/28/2023 12:41:40 PM By: Duanne Guess MD FACS Entered By: Duanne Guess on 04/28/2023 08:24:52 Erin [name] ([id number]) [id number] Page 6 of 12 -------------------------------------------------------------------------------- Problem List Details Patient Name: Date of Service: [name], [name] RBA RA N. 04/28/2023 10:45 A M Medical Record Number: [id number] Patient Account Number: [id number] Date of Birth/Sex: Treating RN: [birth year] (82 y.o. F) Primary Care Provider: [name] Other Clinician: Referring  Provider: Treating Provider/Extender: [name] Weeks in Treatment: 21 Active Problems ICD-10 Encounter Code Description Active Date MDM Diagnosis L97.822 Non-pressure chronic ulcer of other part of left lower leg with fat layer 11/26/2022 No Yes exposed L97.222 Non-pressure chronic ulcer of left calf with fat layer exposed 04/21/2023 No Yes I87.332 Chronic venous hypertension (idiopathic) with ulcer and inflammation of left 11/26/2022 No Yes lower extremity I10 Essential (primary) hypertension 11/26/2022 No Yes Inactive Problems Resolved Problems ICD-10 Code Description Active Date Resolved Date L97.522 Non-pressure chronic ulcer of other part of left foot with fat layer exposed 01/13/2023 01/13/2023 Electronic Signature(s) Signed: 04/28/2023 11:18:26 AM By: Duanne Guess MD FACS Entered By: Duanne Guess on 04/28/2023 08:18:26 -------------------------------------------------------------------------------- Progress Note Details Patient Name: Date of Service: [name], [name] 04/28/2023 10:45 A M Medical Record Number: [id number] Patient Account Number: [id number] Date of Birth/Sex: Treating RN: [birth year] (82 y.o. F) Primary Care Provider: [name] Other Clinician: Referring Provider: Treating Provider/Extender: [name] Weeks in Treatment: 21 Subjective Chief Complaint Information obtained from Patient Erin [name], Erin [name] ([id number]) [id number] Page 7 of 12 Patient presents for treatment of an open ulcer due to venous insufficiency History of Present Illness (HPI) ADMISSION 11/26/2022 This is an 82 year old woman who was referred by her primary care provider for a persistent nonhealing wound on her left lower extremity. It has been present since February of this year. 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 I do not see any formal venous reflux studies in the electronic medical record. She is not diabetic. She does not smoke. Her PCP has been washing her leg each week and applying a Kerlix and Coban wrap, but has not performed any formal debridement and has not been using any sort of topical contact layer or ointment. ABI in clinic was noncompressible, but she has a palpable pedal pulse. 12/10/2022: The wound is a bit smaller and cleaner today. There is still fairly heavy slough on the surface. Edema control is acceptable. 12/16/2022: The wound is measuring the slightest bit smaller today. There is still thick slough on the surface. Edema control is good. 12/23/2022: The wound is a little bit smaller again today. Still with fairly heavy slough accumulation. Edema control is good. She has a new small wound on the proximal portion of the same leg. It looks as though she may have developed a small blister over the weekend subsequently ruptured. There is minimal slough on the surface. 12/30/2022: The  Days Discharge Instructions: May shower and wash wound with dial antibacterial soap and water prior to dressing change. Cleanser: Wound Cleanser 1 x Per Week/30 Days Discharge Instructions: Cleanse the wound with wound cleanser prior to applying a clean dressing using gauze sponges, not tissue or cotton balls. Peri-Wound Care: Triamcinolone 15 (g) 1 x Per Week/30 Days Discharge Instructions: Use triamcinolone 15 (g) as directed Peri-Wound Care: Sween Lotion (Moisturizing lotion) 1 x Per Week/30 Days Discharge Instructions: Apply moisturizing lotion as directed Topical: Gentamicin 1 x Per Week/30 Days Discharge Instructions: As directed by physician Topical: Mupirocin Ointment 1 x Per Week/30 Days Discharge Instructions: Apply Mupirocin (Bactroban) as instructed Topical: Skintegrity Hydrogel 4 (oz) 1 x Per Week/30 Days Discharge Instructions: Apply hydrogel as directed Prim Dressing: Endoform 2x2 in 1 x Per Week/30 Days ary Discharge Instructions: Moisten with saline Secondary Dressing: Optifoam Non-Adhesive  Dressing, 4x4 in 1 x Per Week/30 Days Discharge Instructions: Apply over primary dressing as directed. Com pression Wrap: Kerlix Roll 4.5x3.1 (in/yd) 1 x Per Week/30 Days Discharge Instructions: Apply Kerlix and Coban compression as directed. Com pression Wrap: Coban Self-Adherent Wrap 4x5 (in/yd) 1 x Per Week/30 Days Discharge Instructions: Apply over Kerlix as directed. WOUND #11: - Lower Leg Wound Laterality: Left, Posterior Peri-Wound Care: Triamcinolone 15 (g) 1 x Per Week/ Discharge Instructions: Use triamcinolone 15 (g) as directed Peri-Wound Care: Sween Lotion (Moisturizing lotion) 1 x Per Week/ Discharge Instructions: Apply moisturizing lotion as directed Prim Dressing: Maxorb Extra Ag+ Alginate Dressing, 2x2 (in/in) 1 x Per Week/ ary Discharge Instructions: Apply to wound bed as instructed Secondary Dressing: Woven Gauze Sponge, Non-Sterile 4x4 in 1 x Per Week/ Discharge Instructions: Apply over primary dressing as directed. Com pression Wrap: Kerlix Roll 4.5x3.1 (in/yd) 1 x Per Week/ Discharge Instructions: Apply Kerlix and Coban compression as directed. Com pression Wrap: Coban Self-Adherent Wrap 4x5 (in/yd) 1 x Per Week/ Discharge Instructions: Apply over Kerlix as directed. 04/28/2023: The ankle wound did not change in size this week. There is a little slough on the surface. The wound on her left posteromedial calf is covered with a layer of eschar. Underneath, it is smaller and clean. Edema control is good. I used a curette to debride slough from the ankle wound and slough and eschar from the left posteromedial calf. We will continue silver alginate on the calf wound. I am going to change the contact layer to endoform on the ankle wound. We will continue the topical gentamicin and mupirocin along with hydrogel and cover them with Optifoam to maintain the and moist healing environment. Continue Kerlix and Coban wrap. Follow-up in 1 week. Electronic Signature(s) Signed:  04/28/2023 11:25:43 AM By: Duanne Guess MD FACS Entered By: Duanne Guess on 04/28/2023 08:25:43 Erin [name] ([id number]) [id number] Page 10 of 12 -------------------------------------------------------------------------------- HxROS Details Patient Name: Date of Service: [name], [name] 04/28/2023 10:45 A M Medical Record Number: [id number] Patient Account Number: [id number] Date of Birth/Sex: Treating RN: 06/14/1941 (82 y.o. F) Primary Care Provider: [name] Other Clinician: Referring Provider: Treating Provider/Extender: [name], [name] in Treatment: 21 Eyes Medical History: Positive for: Cataracts Hematologic/Lymphatic Medical History: Past Medical History Notes: thrombocytopenia Cardiovascular Medical History: Positive for: Arrhythmia - bundle branch block; Hypertension; Peripheral Venous Disease Gastrointestinal Medical History: Past Medical History Notes: diverticulosis, GERD Endocrine Medical History: Past Medical History Notes: hypothyroidism Genitourinary Medical History: Past Medical History Notes: CKD stage III Musculoskeletal Medical History: Positive for: Gout; Osteoarthritis Past Medical History Notes: DJD,

## 2023-04-28 NOTE — Progress Notes (Signed)
LOTUS, ZAWORSKI ([id number]) [id number] Page 1 of 9 Visit Report for 04/28/2023 Arrival Information Details Patient Name: Date of Service: [name], [name] RBA RA N. 04/28/2023 10:45 A M Medical Record Number: [id number] Patient Account Number: [id number] Date of Birth/Sex: Treating RN: [birth year] (82 y.o. [name] Primary Care Knoah Nedeau: [name] Other Clinician: Referring Alta Goding: Treating Gerado Nabers/Extender: [name] Weeks in Treatment: 21 Visit Information History Since Last Visit Added or deleted any medications: No Patient Arrived: Ambulatory Any new allergies or adverse reactions: No Arrival Time: 10:48 Had a fall or experienced change in No Accompanied By: granddaughter in law activities of daily living that may affect Transfer Assistance: None risk of falls: Patient Identification Verified: Yes Signs or symptoms of abuse/neglect since last visito No Secondary Verification Process Completed: Yes Hospitalized since last visit: No Patient Requires Transmission-Based Precautions: No Implantable device outside of the clinic excluding No Patient Has Alerts: No cellular tissue based products placed in the center since last visit: Has Dressing in Place as Prescribed: Yes Has Compression in Place as Prescribed: Yes Pain Present Now: No Electronic Signature(s) Signed: 04/28/2023 3:45:49 PM By: Samuella Bruin Entered By: Samuella Bruin on 04/28/2023 07:50:23 -------------------------------------------------------------------------------- Encounter Discharge Information Details Patient Name: Date of Service: [name], [name] 04/28/2023 10:45 A M Medical Record Number: [id number] Patient Account Number: [id number] Date of Birth/Sex: Treating RN: 1940/08/01 (82 y.o. [name] Primary Care Nysia Dell: [name] Other Clinician: Referring Jaklyn Alen: Treating Kyle Stansell/Extender:  [name] Weeks in Treatment: 68 Encounter Discharge Information Items Post Procedure Vitals Discharge Condition: Stable Temperature (F): 98.1 Ambulatory Status: Ambulatory Pulse (bpm): 68 Discharge Destination: Home Respiratory Rate (breaths/min): 18 Transportation: Private Auto Blood Pressure (mmHg): 197/78 Accompanied By: granddaughter in law Schedule Follow-up Appointment: Yes Clinical Summary of Care: Patient Declined Electronic Signature(s) Signed: 04/28/2023 3:45:49 PM By: Samuella Bruin Entered By: Samuella Bruin on 04/28/2023 08:19:52 [name] ([id number]) [id number] Page 2 of 9 -------------------------------------------------------------------------------- Lower Extremity Assessment Details Patient Name: Date of Service: [name], [name] 04/28/2023 10:45 A M Medical Record Number: [id number] Patient Account Number: [id number] Date of Birth/Sex: Treating RN: May 27, 1941 (82 y.o. [name] Primary Care Notnamed Scholz: [name] Other Clinician: Referring Samanta Gal: Treating Braiden Presutti/Extender: [name], [name] in Treatment: 21 Edema Assessment Assessed: [Left: No] [Right: No] Edema: [Left: Ye] [Right: s] Calf Left: Right: Point of Measurement: From Medial Instep 29 cm Ankle Left: Right: Point of Measurement: From Medial Instep 18 cm Vascular Assessment Pulses: Dorsalis Pedis Palpable: [Left:Yes] Extremity colors, hair growth, and conditions: Extremity Color: [Left:Hyperpigmented] Hair Growth on Extremity: [Left:No] Temperature of Extremity: [Left:Warm < 3 seconds] Electronic Signature(s) Signed: 04/28/2023 3:45:49 PM By: Samuella Bruin Entered By: Samuella Bruin on 04/28/2023 07:58:53 -------------------------------------------------------------------------------- Multi Wound Chart Details Patient Name: Date of Service: [name], BA RBA  RA N. 04/28/2023 10:45 A M Medical Record Number: [id number] Patient Account Number: [id number] Date of Birth/Sex: Treating RN: [birth year] (82 y.o. 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 N/A Primary Etiology: Cataracts, Arrhythmia, Hypertension, Cataracts,  Arrhythmia, Hypertension, N/A Comorbid History: Peripheral Venous Disease, Gout, Peripheral Venous Disease, Gout, Osteoarthritis Osteoarthritis 08/29/2022 04/21/2023 N/A Date Acquired: 21 1 N/A Weeks of Treatment: Open Open N/A Wound Status: No No N/A Wound Recurrence: 0.9x0.3x0.1 0.1x0.1x0.1 N/A Measurements L x W x D (cm) 0.212 0.008 N/A A (cm) : rea 0.021 0.001 N/A Volume (cm) : 96.40% 98.90% N/A % Reduction in A rea: 96.40% 98.60% N/A % Reduction in Volume: Full Thickness Without Exposed Full Thickness Without Exposed N/A Classification: Support Structures Support Structures Small None Present N/A Exudate A mount: Serous N/A N/A Exudate Type: amber N/A N/A Exudate Color: Flat and Intact Flat and Intact N/A Wound Margin: Medium (34-66%) None Present (0%) N/A Granulation A mount: Pink N/A N/A Granulation Quality: Medium (34-66%) Large (67-100%) N/A Necrotic A mount: Adherent Slough Eschar N/A Necrotic Tissue: Fat Layer (Subcutaneous Tissue): Yes Fascia: No N/A Exposed Structures: Fascia: No Fat Layer (Subcutaneous Tissue): No Tendon: No Tendon: No Muscle: No Muscle:  No Joint: No Joint: No Bone: No Bone: No Small (1-33%) Large (67-100%) N/A Epithelialization: Debridement - Selective/Open Wound Debridement - Selective/Open Wound N/A Debridement: Pre-procedure Verification/Time Out 11:05 11:05 N/A Taken: Lidocaine 4% Topical Solution Lidocaine 4% Topical Solution N/A Pain Control: Ambulance person, Slough N/A Tissue Debrided: Non-Viable Tissue Skin/Epidermis N/A Level: 0.21 0.01 N/A Debridement A (sq cm): rea Curette Curette N/A Instrument: Minimum Minimum N/A Bleeding: Pressure Pressure N/A Hemostasis A chieved: Procedure was tolerated well Procedure was tolerated well N/A Debridement Treatment Response: 0.9x0.3x0.1 0.1x0.1x0.1 N/A Post Debridement Measurements L x W x D (cm) 0.021 0.001 N/A Post Debridement Volume: (cm) No Abnormalities Noted No Abnormalities Noted N/A Periwound Skin Texture: No Abnormalities Noted No Abnormalities Noted N/A Periwound Skin Moisture: No Abnormalities Noted Rubor: Yes N/A Periwound Skin Color: No Abnormality No Abnormality N/A Temperature: N/A Yes N/A Tenderness on Palpation: Debridement Debridement N/A Procedures Performed: Treatment Notes Electronic Signature(s) Signed: 04/28/2023 11:18:38 AM By: Duanne Guess MD FACS Entered By: Duanne Guess on 04/28/2023 08:18:38 -------------------------------------------------------------------------------- Multi-Disciplinary Care Plan Details Patient Name: Date of Service: [name], [name] 04/28/2023 10:45 A M Medical Record Number: [id number] Patient Account Number: [id number] Date of Birth/Sex: Treating RN: Aug 29, 1940 (82 y.o. [name] Primary Care Adrieana Fennelly: [name] Other Clinician: Referring Don Giarrusso: Treating Clodagh Odenthal/Extender: [name], Frederick Peers in Treatment: [address], [place] ([id number]) [id number] Page 4 of 9 Multidisciplinary Care Plan reviewed  with physician Active Inactive Necrotic Tissue Nursing Diagnoses: Impaired tissue integrity related to necrotic/devitalized tissue Knowledge deficit related to management of necrotic/devitalized tissue Goals: Necrotic/devitalized tissue will be minimized in the wound bed Date Initiated: 11/26/2022 Target Resolution Date: 06/05/2023 Goal Status: Active Patient/caregiver will verbalize understanding of reason and process for debridement of necrotic tissue Date Initiated: 11/26/2022 Date Inactivated: 01/20/2023 Target Resolution Date: 03/06/2023 Goal Status: Met Interventions: Assess patient pain level pre-, during and post procedure and prior to discharge Provide education on necrotic tissue and debridement process Treatment Activities: Apply topical anesthetic as ordered : 11/26/2022 Notes: Wound/Skin Impairment Nursing Diagnoses: Impaired tissue integrity Knowledge deficit related to ulceration/compromised skin integrity Goals: Patient/caregiver will verbalize understanding of skin care regimen Date Initiated: 11/26/2022 Target Resolution Date: 06/05/2023 Goal Status: Active Interventions: Assess ulceration(s) every visit Treatment Activities: Skin care regimen initiated : 11/26/2022 Topical wound management initiated : 11/26/2022 Notes: Electronic Signature(s) Signed: 04/28/2023 3:45:49 PM By: Samuella Bruin Entered By: Samuella Bruin on 04/28/2023 08:03:16 -------------------------------------------------------------------------------- Pain Assessment Details Patient Name: Date of Service: [name], BA RBA RA  LOTUS, ZAWORSKI ([id number]) [id number] Page 1 of 9 Visit Report for 04/28/2023 Arrival Information Details Patient Name: Date of Service: [name], [name] RBA RA N. 04/28/2023 10:45 A M Medical Record Number: [id number] Patient Account Number: [id number] Date of Birth/Sex: Treating RN: [birth year] (82 y.o. [name] Primary Care Knoah Nedeau: [name] Other Clinician: Referring Alta Goding: Treating Gerado Nabers/Extender: [name] Weeks in Treatment: 21 Visit Information History Since Last Visit Added or deleted any medications: No Patient Arrived: Ambulatory Any new allergies or adverse reactions: No Arrival Time: 10:48 Had a fall or experienced change in No Accompanied By: granddaughter in law activities of daily living that may affect Transfer Assistance: None risk of falls: Patient Identification Verified: Yes Signs or symptoms of abuse/neglect since last visito No Secondary Verification Process Completed: Yes Hospitalized since last visit: No Patient Requires Transmission-Based Precautions: No Implantable device outside of the clinic excluding No Patient Has Alerts: No cellular tissue based products placed in the center since last visit: Has Dressing in Place as Prescribed: Yes Has Compression in Place as Prescribed: Yes Pain Present Now: No Electronic Signature(s) Signed: 04/28/2023 3:45:49 PM By: Samuella Bruin Entered By: Samuella Bruin on 04/28/2023 07:50:23 -------------------------------------------------------------------------------- Encounter Discharge Information Details Patient Name: Date of Service: [name], [name] 04/28/2023 10:45 A M Medical Record Number: [id number] Patient Account Number: [id number] Date of Birth/Sex: Treating RN: 1940/08/01 (82 y.o. [name] Primary Care Nysia Dell: [name] Other Clinician: Referring Jaklyn Alen: Treating Kyle Stansell/Extender:  [name] Weeks in Treatment: 68 Encounter Discharge Information Items Post Procedure Vitals Discharge Condition: Stable Temperature (F): 98.1 Ambulatory Status: Ambulatory Pulse (bpm): 68 Discharge Destination: Home Respiratory Rate (breaths/min): 18 Transportation: Private Auto Blood Pressure (mmHg): 197/78 Accompanied By: granddaughter in law Schedule Follow-up Appointment: Yes Clinical Summary of Care: Patient Declined Electronic Signature(s) Signed: 04/28/2023 3:45:49 PM By: Samuella Bruin Entered By: Samuella Bruin on 04/28/2023 08:19:52 [name] ([id number]) [id number] Page 2 of 9 -------------------------------------------------------------------------------- Lower Extremity Assessment Details Patient Name: Date of Service: [name], [name] 04/28/2023 10:45 A M Medical Record Number: [id number] Patient Account Number: [id number] Date of Birth/Sex: Treating RN: May 27, 1941 (82 y.o. [name] Primary Care Notnamed Scholz: [name] Other Clinician: Referring Samanta Gal: Treating Braiden Presutti/Extender: [name], [name] in Treatment: 21 Edema Assessment Assessed: [Left: No] [Right: No] Edema: [Left: Ye] [Right: s] Calf Left: Right: Point of Measurement: From Medial Instep 29 cm Ankle Left: Right: Point of Measurement: From Medial Instep 18 cm Vascular Assessment Pulses: Dorsalis Pedis Palpable: [Left:Yes] Extremity colors, hair growth, and conditions: Extremity Color: [Left:Hyperpigmented] Hair Growth on Extremity: [Left:No] Temperature of Extremity: [Left:Warm < 3 seconds] Electronic Signature(s) Signed: 04/28/2023 3:45:49 PM By: Samuella Bruin Entered By: Samuella Bruin on 04/28/2023 07:58:53 -------------------------------------------------------------------------------- Multi Wound Chart Details Patient Name: Date of Service: [name], BA RBA  RA N. 04/28/2023 10:45 A M Medical Record Number: [id number] Patient Account Number: [id number] Date of Birth/Sex: Treating RN: [birth year] (82 y.o. 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 04/28/2023 10:45 A M Medical Record Number: [id number] Patient Account Number: [id number] Date of Birth/Sex: Treating RN: [birth year] (82 y.o. [name] Primary Care Knoah Nedeau: [name] Other Clinician: Referring Alta Goding: Treating Gerado Nabers/Extender: [name] Weeks in Treatment: 21 Visit Information History Since Last Visit Added or deleted any medications: No Patient Arrived: Ambulatory Any new allergies or adverse reactions: No Arrival Time: 10:48 Had a fall or experienced change in No Accompanied By: granddaughter in law activities of daily living that may affect Transfer Assistance: None risk of falls: Patient Identification Verified: Yes Signs or symptoms of abuse/neglect since last visito No Secondary Verification Process Completed: Yes Hospitalized since last visit: No Patient Requires Transmission-Based Precautions: No Implantable device outside of the clinic excluding No Patient Has Alerts: No cellular tissue based products placed in the center since last visit: Has Dressing in Place as Prescribed: Yes Has Compression in Place as Prescribed: Yes Pain Present Now: No Electronic Signature(s) Signed: 04/28/2023 3:45:49 PM By: Samuella Bruin Entered By: Samuella Bruin on 04/28/2023 07:50:23 -------------------------------------------------------------------------------- Encounter Discharge Information Details Patient Name: Date of Service: [name], [name] 04/28/2023 10:45 A M Medical Record Number: [id number] Patient Account Number: [id number] Date of Birth/Sex: Treating RN: 1940/08/01 (82 y.o. [name] Primary Care Nysia Dell: [name] Other Clinician: Referring Jaklyn Alen: Treating Kyle Stansell/Extender:  [name] Weeks in Treatment: 68 Encounter Discharge Information Items Post Procedure Vitals Discharge Condition: Stable Temperature (F): 98.1 Ambulatory Status: Ambulatory Pulse (bpm): 68 Discharge Destination: Home Respiratory Rate (breaths/min): 18 Transportation: Private Auto Blood Pressure (mmHg): 197/78 Accompanied By: granddaughter in law Schedule Follow-up Appointment: Yes Clinical Summary of Care: Patient Declined Electronic Signature(s) Signed: 04/28/2023 3:45:49 PM By: Samuella Bruin Entered By: Samuella Bruin on 04/28/2023 08:19:52 [name] ([id number]) [id number] Page 2 of 9 -------------------------------------------------------------------------------- Lower Extremity Assessment Details Patient Name: Date of Service: [name], [name] 04/28/2023 10:45 A M Medical Record Number: [id number] Patient Account Number: [id number] Date of Birth/Sex: Treating RN: May 27, 1941 (82 y.o. [name] Primary Care Notnamed Scholz: [name] Other Clinician: Referring Samanta Gal: Treating Braiden Presutti/Extender: [name], [name] in Treatment: 21 Edema Assessment Assessed: [Left: No] [Right: No] Edema: [Left: Ye] [Right: s] Calf Left: Right: Point of Measurement: From Medial Instep 29 cm Ankle Left: Right: Point of Measurement: From Medial Instep 18 cm Vascular Assessment Pulses: Dorsalis Pedis Palpable: [Left:Yes] Extremity colors, hair growth, and conditions: Extremity Color: [Left:Hyperpigmented] Hair Growth on Extremity: [Left:No] Temperature of Extremity: [Left:Warm < 3 seconds] Electronic Signature(s) Signed: 04/28/2023 3:45:49 PM By: Samuella Bruin Entered By: Samuella Bruin on 04/28/2023 07:58:53 -------------------------------------------------------------------------------- Multi Wound Chart Details Patient Name: Date of Service: [name], BA RBA  RA N. 04/28/2023 10:45 A M Medical Record Number: [id number] Patient Account Number: [id number] Date of Birth/Sex: Treating RN: [birth year] (82 y.o. 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

## 2023-05-05 ENCOUNTER — Encounter (HOSPITAL_BASED_OUTPATIENT_CLINIC_OR_DEPARTMENT_OTHER): Payer: Medicare HMO | Admitting: General Surgery

## 2023-05-05 DIAGNOSIS — L97822 Non-pressure chronic ulcer of other part of left lower leg with fat layer exposed: Secondary | ICD-10-CM | POA: Diagnosis not present

## 2023-05-05 DIAGNOSIS — I872 Venous insufficiency (chronic) (peripheral): Secondary | ICD-10-CM | POA: Diagnosis not present

## 2023-05-05 DIAGNOSIS — L97222 Non-pressure chronic ulcer of left calf with fat layer exposed: Secondary | ICD-10-CM | POA: Diagnosis not present

## 2023-05-05 DIAGNOSIS — Z90721 Acquired absence of ovaries, unilateral: Secondary | ICD-10-CM | POA: Diagnosis not present

## 2023-05-05 DIAGNOSIS — I87332 Chronic venous hypertension (idiopathic) with ulcer and inflammation of left lower extremity: Secondary | ICD-10-CM | POA: Diagnosis not present

## 2023-05-05 DIAGNOSIS — M1990 Unspecified osteoarthritis, unspecified site: Secondary | ICD-10-CM | POA: Diagnosis not present

## 2023-05-05 DIAGNOSIS — M109 Gout, unspecified: Secondary | ICD-10-CM | POA: Diagnosis not present

## 2023-05-05 DIAGNOSIS — N183 Chronic kidney disease, stage 3 unspecified: Secondary | ICD-10-CM | POA: Diagnosis not present

## 2023-05-05 DIAGNOSIS — I129 Hypertensive chronic kidney disease with stage 1 through stage 4 chronic kidney disease, or unspecified chronic kidney disease: Secondary | ICD-10-CM | POA: Diagnosis not present

## 2023-05-05 NOTE — Progress Notes (Signed)
Erin [name], Erin [name] ([id number]) [id number] Page 1 of 10 Visit Report for 05/05/2023 Chief Complaint Document Details Patient Name: Date of Service: [name], [name] RBA RA N. 05/05/2023 2:00 PM Medical Record Number: [id number] Patient Account Number: [id number] Date of Birth/Sex: Treating RN: [birth year] (82 y.o. F) Primary Care Provider: [name] Other Clinician: Referring Provider: Treating Provider/Extender: [name] Weeks in Treatment: 22 Information Obtained from: Patient Chief Complaint Patient presents for treatment of an open ulcer due to venous insufficiency Electronic Signature(s) Signed: 05/05/2023 2:26:24 PM By: Duanne Guess MD FACS Entered By: Duanne Guess on 05/05/2023 11:26:24 -------------------------------------------------------------------------------- Debridement Details Patient Name: Date of Service: [name], [name] 05/05/2023 2:00 PM Medical Record Number: [id number] Patient Account Number: [id number] Date of Birth/Sex: Treating RN: 1941/03/09 (82 y.o. [name] Primary Care Provider: [name] Other Clinician: Referring Provider: Treating Provider/Extender: [name] Weeks in Treatment: 22 Debridement Performed for Assessment: Wound #1 Left,Medial Lower Leg Performed By: Physician Duanne Guess, MD The following information was scribed by: Zenaida Deed The information was scribed for: Duanne Guess Debridement Type: Debridement Severity of Tissue Pre Debridement: Fat layer exposed Level of Consciousness (Pre-procedure): Awake and Alert Pre-procedure Verification/Time Out Yes - 14:20 Taken: Start Time: 14:20 Pain Control: Lidocaine 4% Topical Solution Percent of Wound Bed Debrided: 100% T Area Debrided (cm): otal 0.02 Tissue and other material debrided: Non-Viable, Eschar Level: Non-Viable Tissue Debridement Description:  Selective/Open Wound Instrument: Curette Bleeding: None Procedural Pain: 0 Post Procedural Pain: 0 Response to Treatment: Procedure was tolerated well Level of Consciousness (Post- Awake and Alert procedure): Post Debridement Measurements of Total Wound Length: (cm) 0.2 Width: (cm) 0.1 Depth: (cm) 0.1 Volume: (cm) 0.002 Character of Wound/Ulcer Post Debridement: Improved Erin [name], Erin [name] ([id number]) [id number] Page 2 of 10 Severity of Tissue Post Debridement: Fat layer exposed Post Procedure Diagnosis Same as Pre-procedure Electronic Signature(s) Signed: 05/05/2023 2:33:44 PM By: Duanne Guess MD FACS Signed: 05/05/2023 2:40:12 PM By: Zenaida Deed RN, BSN Entered By: Zenaida Deed on 05/05/2023 11:23:39 -------------------------------------------------------------------------------- HPI Details Patient Name: Date of Service: [name], [name] 05/05/2023 2:00 PM Medical Record Number: [id number] Patient Account Number: [id number] Date of Birth/Sex: Treating RN: 1941/02/21 (82 y.o. F) Primary Care Provider: [name] Other Clinician: Referring Provider: Treating Provider/Extender: [name] Weeks in Treatment: 22 History of Present Illness HPI Description: ADMISSION 11/26/2022 This is an 82 year old woman who was referred by her primary care provider for a persistent nonhealing wound on her left lower extremity. It has been present since February of this year. 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 I do not see any formal venous reflux studies in the electronic medical record. She is not diabetic. She does not smoke. Her PCP has been washing her leg each week and applying a Kerlix and Coban wrap, but has not performed any formal debridement and has not been using any sort of topical contact layer or ointment.  ABI in clinic was noncompressible, but she has a palpable pedal pulse. 12/10/2022: The wound is a bit smaller and cleaner today. There is still fairly heavy slough on the surface. Edema control is acceptable. 12/16/2022: The wound is measuring the slightest bit smaller today. There is still thick slough on the surface. Edema control is good. 12/23/2022: The wound is a little bit smaller again today. Still with  Description [phone] Non-pressure chronic ulcer of other part of left lower leg with fat layer exposed I87.332 Chronic venous hypertension (idiopathic) with ulcer and inflammation of left lower extremity I10 Essential (primary) hypertension Follow-up Appointments ppointment in 1 week. - Dr. Lady Gary - room 1 Return A Tuesday 11/5 @ 2:00 pm Anesthetic (In clinic) Topical Lidocaine 4% applied to wound bed Bathing/ Shower/ Hygiene May shower with protection but do not get wound dressing(s) wet. Protect dressing(s) with water repellant cover (for example, large plastic bag) or a cast cover and may then take shower. Wound Treatment Wound #1 - Lower Leg Wound Laterality: Left, Medial Cleanser: Soap and Water 1 x Per Week/30 Days Discharge Instructions: May shower and wash wound with dial antibacterial soap and water prior to dressing change. Cleanser: Wound Cleanser 1 x Per Week/30 Days Discharge Instructions: Cleanse the wound with wound cleanser prior to applying a clean dressing using gauze sponges, not tissue or cotton balls. Peri-Wound Care: Triamcinolone 15 (g) 1 x Per Week/30 Days Discharge Instructions: Use triamcinolone 15 (g) as directed Peri-Wound Care: Sween Lotion (Moisturizing lotion) 1 x Per Week/30 Days Discharge Instructions: Apply moisturizing lotion as directed Topical: Gentamicin 1 x Per Week/30 Days Discharge Instructions: As directed by physician Topical: Mupirocin Ointment 1 x Per Week/30 Days Discharge Instructions: Apply Mupirocin (Bactroban) as instructed Topical: Skintegrity Hydrogel 4 (oz) 1 x Per Week/30 Days Discharge Instructions: Apply hydrogel as directed Prim Dressing: Endoform 2x2 in 1 x Per Week/30 Days ary Discharge Instructions: Moisten with  saline Secondary Dressing: Optifoam Non-Adhesive Dressing, 4x4 in 1 x Per Week/30 Days Discharge Instructions: Apply over primary dressing as directed. Compression Wrap: Kerlix Roll 4.5x3.1 (in/yd) 1 x Per Week/30 Days Discharge Instructions: Apply Kerlix and Coban compression as directed. Compression Wrap: Coban Self-Adherent Wrap 4x5 (in/yd) 1 x Per Week/30 Days Discharge Instructions: Apply over Kerlix as directed. Wound #12 - Lower Leg Wound Laterality: Left, Posterior, Proximal Peri-Wound Care: Sween Lotion (Moisturizing lotion) 1 x Per Week Discharge Instructions: Apply moisturizing lotion as directed Prim Dressing: Maxorb Extra Ag+ Alginate Dressing, 2x2 (in/in) 1 x Per Week ary Discharge Instructions: Apply to wound bed as instructed Secondary Dressing: Woven Gauze Sponge, Non-Sterile 4x4 in 1 x Per Week Discharge Instructions: Apply over primary dressing as directed. Compression Wrap: Kerlix Roll 4.5x3.1 (in/yd) 1 x Per Week Discharge Instructions: Apply Kerlix and Coban compression as directed. Compression Wrap: Coban Self-Adherent Wrap 4x5 (in/yd) 1 x Per Week Discharge Instructions: Apply over Kerlix as directed. Electronic Signature(s) Erin [name], Erin [name] ([id number]) [id number] Page 5 of 10 Signed: 05/05/2023 2:28:52 PM By: Duanne Guess MD FACS Entered By: Duanne Guess on 05/05/2023 11:28:51 -------------------------------------------------------------------------------- Problem List Details Patient Name: Date of Service: [name], [name] 05/05/2023 2:00 PM Medical Record Number: [id number] Patient Account Number: [id number] Date of Birth/Sex: Treating RN: [birth year] (82 y.o. F) Primary Care Provider: [name] Other Clinician: Referring Provider: Treating Provider/Extender: [name], Serafina Royals Weeks in Treatment: 22 Active Problems ICD-10 Encounter Code Description Active Date MDM Diagnosis L97.822  Non-pressure chronic ulcer of other part of left lower leg with fat layer exposed5/22/2024 No Yes I87.332 Chronic venous hypertension (idiopathic) with ulcer and inflammation of left 11/26/2022 No Yes lower extremity I10 Essential (primary) hypertension 11/26/2022 No Yes Inactive Problems Resolved Problems ICD-10 Code Description Active Date Resolved Date L97.522 Non-pressure chronic ulcer of other part of left foot with fat layer exposed 01/13/2023 01/13/2023 L97.222 Non-pressure chronic ulcer of left calf with fat layer exposed 04/21/2023 04/21/2023  Sponge, Non-Sterile 4x4 in 1 x Per Week/ Discharge Instructions: Apply over primary dressing as directed. Com pression Wrap: Kerlix Roll 4.5x3.1 (in/yd) 1 x Per Week/ Discharge Instructions: Apply Kerlix and Coban compression as directed. Com pression Wrap: Coban Self-Adherent Wrap 4x5 (in/yd) 1 x Per Week/ Discharge Instructions: Apply over Kerlix as directed. 05/05/2023: The ankle wound has contracted quite a bit this week. It is extremely superficial with just a little thin eschar around the edges. The left posteromedial calf wound is healed. She had a small pustule just distal to this that was marked as a new wound, but I am not sure that the epidermis is actually violated here. I used a curette to debride the eschar from the ankle wound. We will continue with topical gentamicin, mupirocin, and endoform. The new site did not really have anything that needs to be debrided; we will put a piece of silver alginate over it to help dry it up. Continue Kerlix and Coban wrap. Follow-up in 1 week. Electronic Signature(s) Signed: 05/05/2023 2:29:36 PM By: Duanne Guess MD FACS Entered By: Duanne Guess on 05/05/2023 11:29:35 Erin [name] ([id number]) [id number] Page 9 of 10 -------------------------------------------------------------------------------- HxROS Details Patient Name: Date of Service: [name], [name] 05/05/2023 2:00 PM Medical Record Number: [id number] Patient Account Number: [id number] Date of Birth/Sex:  Treating RN: 1940-09-22 (82 y.o. F) Primary Care Provider: [name] Other Clinician: Referring Provider: Treating Provider/Extender: [name], [name] in Treatment: 22 Eyes Medical History: Positive for: Cataracts Hematologic/Lymphatic Medical History: Past Medical History Notes: thrombocytopenia Cardiovascular Medical History: Positive for: Arrhythmia - bundle branch block; Hypertension; Peripheral Venous Disease Gastrointestinal Medical History: Past Medical History Notes: diverticulosis, GERD Endocrine Medical History: Past Medical History Notes: hypothyroidism Genitourinary Medical History: Past Medical History Notes: CKD stage III Musculoskeletal Medical History: Positive for: Gout; Osteoarthritis Past Medical History Notes: DJD, osteoporosis Oncologic Medical History: Past Medical History Notes: breast cancer HBO Extended History Items Eyes: Cataracts Immunizations Pneumococcal Vaccine: Received Pneumococcal Vaccination: Yes Received Pneumococcal Vaccination On or After 60th Birthday: No Implantable Devices No devices added Hospitalization / Surgery History Type of Hospitalization/Surgery Erin [name], Erin [name] ([id number]) [id number] Page 10 of 10 Salpingoophorectomy Colon resection Appendectomy Cystoscopy w/ retrogrades Joint replacement Mastectomy right gastric ulcer repair Abdominal hysterectomy Family and Social History Cancer: Yes - Siblings; Diabetes: No; Heart Disease: No; Hereditary Spherocytosis: No; Hypertension: No; Kidney Disease: No; Lung Disease: Yes - Siblings; Seizures: No; Stroke: No; Thyroid Problems: No; Tuberculosis: No; Never smoker; Marital Status - Widowed; Alcohol Use: Never; Drug Use: No History; Caffeine Use: Moderate; Financial Concerns: No; Food, Clothing or Shelter Needs: No; Support System Lacking: No; Transportation Concerns: No Electronic Signature(s) Signed:  05/05/2023 2:33:44 PM By: Duanne Guess MD FACS Entered By: Duanne Guess on 05/05/2023 11:27:28 -------------------------------------------------------------------------------- SuperBill Details Patient Name: Date of Service: [name], [name] 05/05/2023 Medical Record Number: [id number] Patient Account Number: [id number] Date of Birth/Sex: Treating RN: 1941-07-01 (82 y.o. F) Primary Care Provider: [name] Other Clinician: Referring Provider: Treating Provider/Extender: [name], [name] in Treatment: 22 Diagnosis Coding ICD-10 Codes Code Description [phone] Non-pressure chronic ulcer of other part of left lower leg with fat layer exposed I87.332 Chronic venous hypertension (idiopathic) with ulcer and inflammation of left lower extremity I10 Essential (primary) hypertension Facility Procedures : CPT4 Code: [id number] Description: 97597 - DEBRIDE WOUND 1ST 20 SQ CM OR < ICD-10 Diagnosis Description L97.822 Non-pressure chronic ulcer of other part of left lower  fibrotic surface underneath and slough. Edema remains well-controlled. 03/31/2023: She has a new wound that was identified when her wraps and dressings were removed today. It is just to the midline of the anterior tibial surface. The fat layer is exposed. There is a little bit of eschar and dry skin along with some thin slough. The wound closer to her ankle measured smaller today. The surface continues to improve although portions of it still remain fibrotic. There is slough buildup present. 04/07/2023: The wound that was new last week has nearly closed, but remains open by perhaps about a millimeter. The wound closer to her ankle had no significant change based on measurements, but visually it looks smaller to me. It is a little bit dry today and the surface is still fibrotic. 04/14/2023: The more proximal wound has completely healed. The wound at her ankle is quite a bit smaller with substantial epithelialization. There is just a small open portion of the distal aspect. It is covered with slough and eschar. 04/21/2023: The ankle wound is just the slightest bit smaller but it is shallower. There is slough and eschar accumulation. She has a new wound on her left posteromedial calf. It looks as though there was some friction or pinching, potentially from the Ace bandage. There is a bit of slough on the surface as well as some old blood. 04/28/2023: The ankle wound did not change in size this week. There is a little slough on the surface. The wound on her left posteromedial calf is covered with a layer of eschar. Underneath, it is smaller and clean. Edema control is good. 05/05/2023: The ankle wound has contracted quite a bit this week. It is extremely superficial with just a little thin eschar around the edges. The  left posteromedial calf wound is healed. She had a small pustule just distal to this that was marked as a new wound, but I am not sure that the epidermis is actually violated here. Electronic Signature(s) Signed: 05/05/2023 2:27:22 PM By: Duanne Guess MD FACS Entered By: Duanne Guess on 05/05/2023 11:27:22 -------------------------------------------------------------------------------- Physical Exam Details Patient Name: Date of Service: [name], [name] 05/05/2023 2:00 PM Medical Record Number: [id number] Patient Account Number: [id number] Date of Birth/Sex: Treating RN: 11/21/1940 (82 y.o. F) Primary Care Provider: [name] Other Clinician: Referring Provider: Treating Provider/Extender: [name], [name] in Treatment: 22 Constitutional Hypertensive, asymptomatic. . . . no acute distress. Respiratory Normal work of breathing on room air.. Notes 05/05/2023: The ankle wound has contracted quite a bit this week. It is extremely superficial with just a little thin eschar around the edges. The left posteromedial calf wound is healed. She had a small pustule just distal to this that was marked as a new wound, but I am not sure that the epidermis is actually violated here. Electronic Signature(s) Signed: 05/05/2023 2:27:55 PM By: Duanne Guess MD FACS Entered By: Duanne Guess on 05/05/2023 11:27:54 -------------------------------------------------------------------------------- Physician Orders Details Patient Name: Date of Service: [name], [name] 05/05/2023 2:00 PM Medical Record Number: [id number] Patient Account Number: [id number] Date of Birth/Sex: Treating RN: 1941/05/22 (82 y.o. [name] Primary Care Provider: [name] Other Clinician: Referring Provider: Treating Provider/Extender: [name] Weeks in Treatment: 22 The following information was scribed by: Zenaida Deed The information was scribed for: Erin [name], Erin [name] ([id number]) [id number] Page 4 of 10 Verbal / Phone Orders: No Diagnosis Coding ICD-10 Coding Code  fibrotic surface underneath and slough. Edema remains well-controlled. 03/31/2023: She has a new wound that was identified when her wraps and dressings were removed today. It is just to the midline of the anterior tibial surface. The fat layer is exposed. There is a little bit of eschar and dry skin along with some thin slough. The wound closer to her ankle measured smaller today. The surface continues to improve although portions of it still remain fibrotic. There is slough buildup present. 04/07/2023: The wound that was new last week has nearly closed, but remains open by perhaps about a millimeter. The wound closer to her ankle had no significant change based on measurements, but visually it looks smaller to me. It is a little bit dry today and the surface is still fibrotic. 04/14/2023: The more proximal wound has completely healed. The wound at her ankle is quite a bit smaller with substantial epithelialization. There is just a small open portion of the distal aspect. It is covered with slough and eschar. 04/21/2023: The ankle wound is just the slightest bit smaller but it is shallower. There is slough and eschar accumulation. She has a new wound on her left posteromedial calf. It looks as though there was some friction or pinching, potentially from the Ace bandage. There is a bit of slough on the surface as well as some old blood. 04/28/2023: The ankle wound did not change in size this week. There is a little slough on the surface. The wound on her left posteromedial calf is covered with a layer of eschar. Underneath, it is smaller and clean. Edema control is good. 05/05/2023: The ankle wound has contracted quite a bit this week. It is extremely superficial with just a little thin eschar around the edges. The  left posteromedial calf wound is healed. She had a small pustule just distal to this that was marked as a new wound, but I am not sure that the epidermis is actually violated here. Electronic Signature(s) Signed: 05/05/2023 2:27:22 PM By: Duanne Guess MD FACS Entered By: Duanne Guess on 05/05/2023 11:27:22 -------------------------------------------------------------------------------- Physical Exam Details Patient Name: Date of Service: [name], [name] 05/05/2023 2:00 PM Medical Record Number: [id number] Patient Account Number: [id number] Date of Birth/Sex: Treating RN: 11/21/1940 (82 y.o. F) Primary Care Provider: [name] Other Clinician: Referring Provider: Treating Provider/Extender: [name], [name] in Treatment: 22 Constitutional Hypertensive, asymptomatic. . . . no acute distress. Respiratory Normal work of breathing on room air.. Notes 05/05/2023: The ankle wound has contracted quite a bit this week. It is extremely superficial with just a little thin eschar around the edges. The left posteromedial calf wound is healed. She had a small pustule just distal to this that was marked as a new wound, but I am not sure that the epidermis is actually violated here. Electronic Signature(s) Signed: 05/05/2023 2:27:55 PM By: Duanne Guess MD FACS Entered By: Duanne Guess on 05/05/2023 11:27:54 -------------------------------------------------------------------------------- Physician Orders Details Patient Name: Date of Service: [name], [name] 05/05/2023 2:00 PM Medical Record Number: [id number] Patient Account Number: [id number] Date of Birth/Sex: Treating RN: 1941/05/22 (82 y.o. [name] Primary Care Provider: [name] Other Clinician: Referring Provider: Treating Provider/Extender: [name] Weeks in Treatment: 22 The following information was scribed by: Zenaida Deed The information was scribed for: Erin [name], Erin [name] ([id number]) [id number] Page 4 of 10 Verbal / Phone Orders: No Diagnosis Coding ICD-10 Coding Code  Electronic Signature(s) Signed: 05/05/2023 2:26:03 PM By: Duanne Guess MD FACS Entered By: Duanne Guess on 05/05/2023 11:26:03 -------------------------------------------------------------------------------- Progress Note Details Patient Name: Date of Service: [name], [name] 05/05/2023 2:00 PM Medical Record Number: [id number] Patient Account Number: [id number] Date of Birth/Sex: Treating RN: 1941-07-02 (82 y.o. F) Primary Care Provider: [name] Other Clinician: Referring Provider: Treating Provider/Extender: [name], [name] in Treatment: [address] [place], [place] N ([id number]) [id number] Page 6 of 10 Chief Complaint Information obtained from Patient Patient presents for treatment of an open ulcer due to venous insufficiency History of Present Illness (HPI) ADMISSION 11/26/2022 This is an 82 year old woman who was referred by her primary care provider for a persistent nonhealing wound on her left lower extremity. It has been present since February of this year. 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 I do not see any formal venous reflux studies in the electronic medical record. She is not diabetic. She does not smoke. Her PCP has been washing her leg each week  and applying a Kerlix and Coban wrap, but has not performed any formal debridement and has not been using any sort of topical contact layer or ointment. ABI in clinic was noncompressible, but she has a palpable pedal pulse. 12/10/2022: The wound is a bit smaller and cleaner today. There is still fairly heavy slough on the surface. Edema control is acceptable. 12/16/2022: The wound is measuring the slightest bit smaller today. There is still thick slough on the surface. Edema control is good. 12/23/2022: The wound is a little bit smaller again today. Still with fairly heavy slough accumulation. Edema control is good. She has a new small wound on the proximal portion of the same leg. It looks as though she may have developed a small blister over the weekend subsequently ruptured. There is minimal slough on the surface. 12/30/2022: The wound is about the same size, but substantially cleaner. There is some hypertrophic granulation tissue starting to emerge. The wound that was new last week has closed but she has a different new wound today on the lateral aspect of her left lower leg. It also appears to have been a blister that opened and ruptured. There is a little bit of slough on the surface. 01/06/2023: She has new wounds today. She has a wound on the PIP knuckle of her left third and fourth toes, as well as an anterior tibial wound. She says that the wrap was too tight and it also looks like when she pushes her foot into her shoe, it shifts the wrapping back to her midfoot causing bunching up of the wrap material and pain. The lateral leg wounds are both smaller with a little slough and eschar present. The original medial leg wound is smaller and more superficial. There is slough on the surface. 01/13/2023: She has new wounds today. The anterior tibial wound has a satellite lesion that has opened up adjacent to it. Has additional wounds on her fifth toe in addition to the existing ones on her third and fourth  toe. The lateral leg wounds seem to have healed. The original medial leg wound is also smaller, but she and her daughter have several questions regarding why we are not making forward progress, all of which are very reasonable. 01/20/2023: The wounds on her toes have healed. There is a tiny wound on the dorsum of her foot with a  Erin [name], Erin [name] ([id number]) [id number] Page 1 of 10 Visit Report for 05/05/2023 Chief Complaint Document Details Patient Name: Date of Service: [name], [name] RBA RA N. 05/05/2023 2:00 PM Medical Record Number: [id number] Patient Account Number: [id number] Date of Birth/Sex: Treating RN: [birth year] (82 y.o. F) Primary Care Provider: [name] Other Clinician: Referring Provider: Treating Provider/Extender: [name] Weeks in Treatment: 22 Information Obtained from: Patient Chief Complaint Patient presents for treatment of an open ulcer due to venous insufficiency Electronic Signature(s) Signed: 05/05/2023 2:26:24 PM By: Duanne Guess MD FACS Entered By: Duanne Guess on 05/05/2023 11:26:24 -------------------------------------------------------------------------------- Debridement Details Patient Name: Date of Service: [name], [name] 05/05/2023 2:00 PM Medical Record Number: [id number] Patient Account Number: [id number] Date of Birth/Sex: Treating RN: 1941/03/09 (82 y.o. [name] Primary Care Provider: [name] Other Clinician: Referring Provider: Treating Provider/Extender: [name] Weeks in Treatment: 22 Debridement Performed for Assessment: Wound #1 Left,Medial Lower Leg Performed By: Physician Duanne Guess, MD The following information was scribed by: Zenaida Deed The information was scribed for: Duanne Guess Debridement Type: Debridement Severity of Tissue Pre Debridement: Fat layer exposed Level of Consciousness (Pre-procedure): Awake and Alert Pre-procedure Verification/Time Out Yes - 14:20 Taken: Start Time: 14:20 Pain Control: Lidocaine 4% Topical Solution Percent of Wound Bed Debrided: 100% T Area Debrided (cm): otal 0.02 Tissue and other material debrided: Non-Viable, Eschar Level: Non-Viable Tissue Debridement Description:  Selective/Open Wound Instrument: Curette Bleeding: None Procedural Pain: 0 Post Procedural Pain: 0 Response to Treatment: Procedure was tolerated well Level of Consciousness (Post- Awake and Alert procedure): Post Debridement Measurements of Total Wound Length: (cm) 0.2 Width: (cm) 0.1 Depth: (cm) 0.1 Volume: (cm) 0.002 Character of Wound/Ulcer Post Debridement: Improved Erin [name], Erin [name] ([id number]) [id number] Page 2 of 10 Severity of Tissue Post Debridement: Fat layer exposed Post Procedure Diagnosis Same as Pre-procedure Electronic Signature(s) Signed: 05/05/2023 2:33:44 PM By: Duanne Guess MD FACS Signed: 05/05/2023 2:40:12 PM By: Zenaida Deed RN, BSN Entered By: Zenaida Deed on 05/05/2023 11:23:39 -------------------------------------------------------------------------------- HPI Details Patient Name: Date of Service: [name], [name] 05/05/2023 2:00 PM Medical Record Number: [id number] Patient Account Number: [id number] Date of Birth/Sex: Treating RN: 1941/02/21 (82 y.o. F) Primary Care Provider: [name] Other Clinician: Referring Provider: Treating Provider/Extender: [name] Weeks in Treatment: 22 History of Present Illness HPI Description: ADMISSION 11/26/2022 This is an 82 year old woman who was referred by her primary care provider for a persistent nonhealing wound on her left lower extremity. It has been present since February of this year. 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 I do not see any formal venous reflux studies in the electronic medical record. She is not diabetic. She does not smoke. Her PCP has been washing her leg each week and applying a Kerlix and Coban wrap, but has not performed any formal debridement and has not been using any sort of topical contact layer or ointment.  ABI in clinic was noncompressible, but she has a palpable pedal pulse. 12/10/2022: The wound is a bit smaller and cleaner today. There is still fairly heavy slough on the surface. Edema control is acceptable. 12/16/2022: The wound is measuring the slightest bit smaller today. There is still thick slough on the surface. Edema control is good. 12/23/2022: The wound is a little bit smaller again today. Still with  Electronic Signature(s) Signed: 05/05/2023 2:26:03 PM By: Duanne Guess MD FACS Entered By: Duanne Guess on 05/05/2023 11:26:03 -------------------------------------------------------------------------------- Progress Note Details Patient Name: Date of Service: [name], [name] 05/05/2023 2:00 PM Medical Record Number: [id number] Patient Account Number: [id number] Date of Birth/Sex: Treating RN: 1941-07-02 (82 y.o. F) Primary Care Provider: [name] Other Clinician: Referring Provider: Treating Provider/Extender: [name], [name] in Treatment: [address] [place], [place] N ([id number]) [id number] Page 6 of 10 Chief Complaint Information obtained from Patient Patient presents for treatment of an open ulcer due to venous insufficiency History of Present Illness (HPI) ADMISSION 11/26/2022 This is an 82 year old woman who was referred by her primary care provider for a persistent nonhealing wound on her left lower extremity. It has been present since February of this year. 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 I do not see any formal venous reflux studies in the electronic medical record. She is not diabetic. She does not smoke. Her PCP has been washing her leg each week  and applying a Kerlix and Coban wrap, but has not performed any formal debridement and has not been using any sort of topical contact layer or ointment. ABI in clinic was noncompressible, but she has a palpable pedal pulse. 12/10/2022: The wound is a bit smaller and cleaner today. There is still fairly heavy slough on the surface. Edema control is acceptable. 12/16/2022: The wound is measuring the slightest bit smaller today. There is still thick slough on the surface. Edema control is good. 12/23/2022: The wound is a little bit smaller again today. Still with fairly heavy slough accumulation. Edema control is good. She has a new small wound on the proximal portion of the same leg. It looks as though she may have developed a small blister over the weekend subsequently ruptured. There is minimal slough on the surface. 12/30/2022: The wound is about the same size, but substantially cleaner. There is some hypertrophic granulation tissue starting to emerge. The wound that was new last week has closed but she has a different new wound today on the lateral aspect of her left lower leg. It also appears to have been a blister that opened and ruptured. There is a little bit of slough on the surface. 01/06/2023: She has new wounds today. She has a wound on the PIP knuckle of her left third and fourth toes, as well as an anterior tibial wound. She says that the wrap was too tight and it also looks like when she pushes her foot into her shoe, it shifts the wrapping back to her midfoot causing bunching up of the wrap material and pain. The lateral leg wounds are both smaller with a little slough and eschar present. The original medial leg wound is smaller and more superficial. There is slough on the surface. 01/13/2023: She has new wounds today. The anterior tibial wound has a satellite lesion that has opened up adjacent to it. Has additional wounds on her fifth toe in addition to the existing ones on her third and fourth  toe. The lateral leg wounds seem to have healed. The original medial leg wound is also smaller, but she and her daughter have several questions regarding why we are not making forward progress, all of which are very reasonable. 01/20/2023: The wounds on her toes have healed. There is a tiny wound on the dorsum of her foot with a  Description [phone] Non-pressure chronic ulcer of other part of left lower leg with fat layer exposed I87.332 Chronic venous hypertension (idiopathic) with ulcer and inflammation of left lower extremity I10 Essential (primary) hypertension Follow-up Appointments ppointment in 1 week. - Dr. Lady Gary - room 1 Return A Tuesday 11/5 @ 2:00 pm Anesthetic (In clinic) Topical Lidocaine 4% applied to wound bed Bathing/ Shower/ Hygiene May shower with protection but do not get wound dressing(s) wet. Protect dressing(s) with water repellant cover (for example, large plastic bag) or a cast cover and may then take shower. Wound Treatment Wound #1 - Lower Leg Wound Laterality: Left, Medial Cleanser: Soap and Water 1 x Per Week/30 Days Discharge Instructions: May shower and wash wound with dial antibacterial soap and water prior to dressing change. Cleanser: Wound Cleanser 1 x Per Week/30 Days Discharge Instructions: Cleanse the wound with wound cleanser prior to applying a clean dressing using gauze sponges, not tissue or cotton balls. Peri-Wound Care: Triamcinolone 15 (g) 1 x Per Week/30 Days Discharge Instructions: Use triamcinolone 15 (g) as directed Peri-Wound Care: Sween Lotion (Moisturizing lotion) 1 x Per Week/30 Days Discharge Instructions: Apply moisturizing lotion as directed Topical: Gentamicin 1 x Per Week/30 Days Discharge Instructions: As directed by physician Topical: Mupirocin Ointment 1 x Per Week/30 Days Discharge Instructions: Apply Mupirocin (Bactroban) as instructed Topical: Skintegrity Hydrogel 4 (oz) 1 x Per Week/30 Days Discharge Instructions: Apply hydrogel as directed Prim Dressing: Endoform 2x2 in 1 x Per Week/30 Days ary Discharge Instructions: Moisten with  saline Secondary Dressing: Optifoam Non-Adhesive Dressing, 4x4 in 1 x Per Week/30 Days Discharge Instructions: Apply over primary dressing as directed. Compression Wrap: Kerlix Roll 4.5x3.1 (in/yd) 1 x Per Week/30 Days Discharge Instructions: Apply Kerlix and Coban compression as directed. Compression Wrap: Coban Self-Adherent Wrap 4x5 (in/yd) 1 x Per Week/30 Days Discharge Instructions: Apply over Kerlix as directed. Wound #12 - Lower Leg Wound Laterality: Left, Posterior, Proximal Peri-Wound Care: Sween Lotion (Moisturizing lotion) 1 x Per Week Discharge Instructions: Apply moisturizing lotion as directed Prim Dressing: Maxorb Extra Ag+ Alginate Dressing, 2x2 (in/in) 1 x Per Week ary Discharge Instructions: Apply to wound bed as instructed Secondary Dressing: Woven Gauze Sponge, Non-Sterile 4x4 in 1 x Per Week Discharge Instructions: Apply over primary dressing as directed. Compression Wrap: Kerlix Roll 4.5x3.1 (in/yd) 1 x Per Week Discharge Instructions: Apply Kerlix and Coban compression as directed. Compression Wrap: Coban Self-Adherent Wrap 4x5 (in/yd) 1 x Per Week Discharge Instructions: Apply over Kerlix as directed. Electronic Signature(s) Erin [name], Erin [name] ([id number]) [id number] Page 5 of 10 Signed: 05/05/2023 2:28:52 PM By: Duanne Guess MD FACS Entered By: Duanne Guess on 05/05/2023 11:28:51 -------------------------------------------------------------------------------- Problem List Details Patient Name: Date of Service: [name], [name] 05/05/2023 2:00 PM Medical Record Number: [id number] Patient Account Number: [id number] Date of Birth/Sex: Treating RN: [birth year] (82 y.o. F) Primary Care Provider: [name] Other Clinician: Referring Provider: Treating Provider/Extender: [name], Serafina Royals Weeks in Treatment: 22 Active Problems ICD-10 Encounter Code Description Active Date MDM Diagnosis L97.822  Non-pressure chronic ulcer of other part of left lower leg with fat layer exposed5/22/2024 No Yes I87.332 Chronic venous hypertension (idiopathic) with ulcer and inflammation of left 11/26/2022 No Yes lower extremity I10 Essential (primary) hypertension 11/26/2022 No Yes Inactive Problems Resolved Problems ICD-10 Code Description Active Date Resolved Date L97.522 Non-pressure chronic ulcer of other part of left foot with fat layer exposed 01/13/2023 01/13/2023 L97.222 Non-pressure chronic ulcer of left calf with fat layer exposed 04/21/2023 04/21/2023  Sponge, Non-Sterile 4x4 in 1 x Per Week/ Discharge Instructions: Apply over primary dressing as directed. Com pression Wrap: Kerlix Roll 4.5x3.1 (in/yd) 1 x Per Week/ Discharge Instructions: Apply Kerlix and Coban compression as directed. Com pression Wrap: Coban Self-Adherent Wrap 4x5 (in/yd) 1 x Per Week/ Discharge Instructions: Apply over Kerlix as directed. 05/05/2023: The ankle wound has contracted quite a bit this week. It is extremely superficial with just a little thin eschar around the edges. The left posteromedial calf wound is healed. She had a small pustule just distal to this that was marked as a new wound, but I am not sure that the epidermis is actually violated here. I used a curette to debride the eschar from the ankle wound. We will continue with topical gentamicin, mupirocin, and endoform. The new site did not really have anything that needs to be debrided; we will put a piece of silver alginate over it to help dry it up. Continue Kerlix and Coban wrap. Follow-up in 1 week. Electronic Signature(s) Signed: 05/05/2023 2:29:36 PM By: Duanne Guess MD FACS Entered By: Duanne Guess on 05/05/2023 11:29:35 Erin [name] ([id number]) [id number] Page 9 of 10 -------------------------------------------------------------------------------- HxROS Details Patient Name: Date of Service: [name], [name] 05/05/2023 2:00 PM Medical Record Number: [id number] Patient Account Number: [id number] Date of Birth/Sex:  Treating RN: 1940-09-22 (82 y.o. F) Primary Care Provider: [name] Other Clinician: Referring Provider: Treating Provider/Extender: [name], [name] in Treatment: 22 Eyes Medical History: Positive for: Cataracts Hematologic/Lymphatic Medical History: Past Medical History Notes: thrombocytopenia Cardiovascular Medical History: Positive for: Arrhythmia - bundle branch block; Hypertension; Peripheral Venous Disease Gastrointestinal Medical History: Past Medical History Notes: diverticulosis, GERD Endocrine Medical History: Past Medical History Notes: hypothyroidism Genitourinary Medical History: Past Medical History Notes: CKD stage III Musculoskeletal Medical History: Positive for: Gout; Osteoarthritis Past Medical History Notes: DJD, osteoporosis Oncologic Medical History: Past Medical History Notes: breast cancer HBO Extended History Items Eyes: Cataracts Immunizations Pneumococcal Vaccine: Received Pneumococcal Vaccination: Yes Received Pneumococcal Vaccination On or After 60th Birthday: No Implantable Devices No devices added Hospitalization / Surgery History Type of Hospitalization/Surgery Erin [name], Erin [name] ([id number]) [id number] Page 10 of 10 Salpingoophorectomy Colon resection Appendectomy Cystoscopy w/ retrogrades Joint replacement Mastectomy right gastric ulcer repair Abdominal hysterectomy Family and Social History Cancer: Yes - Siblings; Diabetes: No; Heart Disease: No; Hereditary Spherocytosis: No; Hypertension: No; Kidney Disease: No; Lung Disease: Yes - Siblings; Seizures: No; Stroke: No; Thyroid Problems: No; Tuberculosis: No; Never smoker; Marital Status - Widowed; Alcohol Use: Never; Drug Use: No History; Caffeine Use: Moderate; Financial Concerns: No; Food, Clothing or Shelter Needs: No; Support System Lacking: No; Transportation Concerns: No Electronic Signature(s) Signed:  05/05/2023 2:33:44 PM By: Duanne Guess MD FACS Entered By: Duanne Guess on 05/05/2023 11:27:28 -------------------------------------------------------------------------------- SuperBill Details Patient Name: Date of Service: [name], [name] 05/05/2023 Medical Record Number: [id number] Patient Account Number: [id number] Date of Birth/Sex: Treating RN: 1941-07-01 (82 y.o. F) Primary Care Provider: [name] Other Clinician: Referring Provider: Treating Provider/Extender: [name], [name] in Treatment: 22 Diagnosis Coding ICD-10 Codes Code Description [phone] Non-pressure chronic ulcer of other part of left lower leg with fat layer exposed I87.332 Chronic venous hypertension (idiopathic) with ulcer and inflammation of left lower extremity I10 Essential (primary) hypertension Facility Procedures : CPT4 Code: [id number] Description: 97597 - DEBRIDE WOUND 1ST 20 SQ CM OR < ICD-10 Diagnosis Description L97.822 Non-pressure chronic ulcer of other part of left lower

## 2023-05-05 NOTE — Progress Notes (Signed)
Alginate Dressing, 2x2 (in/in) Discharge Instruction: Apply to wound bed as instructed Secondary Dressing Woven Gauze Sponge, Non-Sterile 4x4 in Discharge Instruction: Apply over primary dressing as directed. Secured With Compression Wrap Kerlix Roll 4.5x3.1 (in/yd) Discharge Instruction: Apply Kerlix and Coban compression as directed. Coban Self-Adherent Wrap 4x5 (in/yd) Discharge Instruction: Apply over Kerlix as directed. Compression Stockings Add-Ons Electronic Signature(s) Signed: 05/05/2023 2:40:12 PM By: Erin Deed RN, BSN Entered By: Erin [name] on 05/05/2023  11:12:14 -------------------------------------------------------------------------------- Vitals Details Patient Name: Date of Service: NO RMA [name], [name]. 05/05/2023 2:00 PM Medical Record Number: [id number] Patient Account Number: [id number] Date of Birth/Sex: Treating RN: [birth year] (82 y.o. [address], [address] [place], [place] ([id number]) [phone].pdf Page 10 of 10 Primary Care Erin [name]: Erin [name] Other Clinician: Referring Erin [name]: Treating Erin [name]/Extender: Erin [name], Erin [name] in Treatment: 22 Vital Signs Time Taken: 13:57 Temperature (F): 98 Height (in): 60 Pulse (bpm): 63 Weight (lbs): 138 Respiratory Rate (breaths/min): 18 Body Mass Index (BMI): 26.9 Blood Pressure (mmHg): 205/78 Reference Range: 80 - 120 mg / dl Electronic Signature(s) Signed: 05/05/2023 2:40:12 PM By: Erin Deed RN, BSN Entered By: Erin [name] on 05/05/2023 10:57:15  Apply hydrogel as directed Primary Dressing Endoform 2x2 in Discharge Instruction: Moisten with saline Secondary Dressing Optifoam Non-Adhesive Dressing, 4x4 in Discharge Instruction: Apply over primary dressing as directed. Secured With Compression Wrap Kerlix Roll 4.5x3.1 (in/yd) Discharge Instruction: Apply Kerlix and Coban compression as directed. Coban Self-Adherent Wrap 4x5 (in/yd) Discharge Instruction: Apply over Kerlix as directed. Compression Stockings Add-Ons Electronic Signature(s) Signed: 05/05/2023 2:40:12 PM By: Erin Deed RN, BSN Entered By: Erin [name] on 05/05/2023 11:10:35 -------------------------------------------------------------------------------- Wound Assessment Details Patient Name: Date of Service: NO RMA [name], [name]. 05/05/2023 2:00 PM Medical Record Number: [id number] Patient Account Number: [id number] Date of Birth/Sex: Treating RN: 1940/07/24 (82 y.o. 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, Gout, Osteoarthritis Clustered Wound:  No Photos Wound Measurements Erin [name], Erin [name] ([id number]) Length: (cm) 0 Width: (cm) 0 Depth: (cm) 0 Area: (cm) 0 Volume: (cm) 0 [id number].pdf Page 8 of 10 % Reduction in Area: 100% % Reduction in Volume: 100% Epithelialization: Large (67-100%) Tunneling: No Undermining: No Wound Description Classification: Full Thickness Without Exposed Support Structures Exudate Amount: None Present Foul Odor After Cleansing: No Slough/Fibrino No Wound Bed Granulation Amount: None Present (0%) Exposed Structure Necrotic Amount: None Present (0%) Fascia Exposed: No Fat Layer (Subcutaneous Tissue) Exposed: No Tendon Exposed: No Muscle Exposed: No Joint Exposed: No Bone Exposed: No Periwound Skin Texture Texture Color No Abnormalities Noted: Yes No Abnormalities Noted: Yes Moisture Temperature / Pain No Abnormalities Noted: Yes Temperature: No Abnormality Electronic Signature(s) Signed: 05/05/2023 2:40:12 PM By: Erin Deed RN, BSN Entered By: Erin [name] on 05/05/2023 11:11:31 -------------------------------------------------------------------------------- Wound Assessment Details Patient Name: Date of Service: NO RMA [name], [name]. 05/05/2023 2:00 PM Medical Record Number: [id number] Patient Account Number: [id number] Date of Birth/Sex: Treating RN: [birth year] (82 y.o. 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, Gout, Osteoarthritis Clustered Wound: No Photos Wound Measurements Length: (cm) 0.4 Width:  (cm) 0.4 Depth: (cm) 0.1 Erin [name], Erin [name] ([id number]) Area: (cm) 0.126 Volume: (cm) 0.013 % Reduction in Area: % Reduction in Volume: Epithelialization: Medium (34-66%) [id number].pdf Page 9 of 10 Tunneling: No Undermining: No Wound Description Classification: Full Thickness Without Exposed Support Wound Margin: Flat and Intact Exudate Amount: Small Exudate Type: Purulent Exudate Color: yellow, brown, green Structures Foul Odor After Cleansing: No Slough/Fibrino Yes Wound Bed Granulation Amount: None Present (0%) Exposed Structure Necrotic Amount: Large (67-100%) Fascia Exposed: No Necrotic Quality: Adherent Slough Fat Layer (Subcutaneous Tissue) Exposed: Yes Tendon Exposed: No Muscle Exposed: No Joint Exposed: No Bone Exposed: No Periwound Skin Texture Texture Color No Abnormalities Noted: Yes No Abnormalities Noted: Yes Moisture Temperature / Pain No Abnormalities Noted: Yes Temperature: No Abnormality Tenderness on Palpation: Yes Treatment Notes Wound #12 (Lower Leg) Wound Laterality: Left, Posterior, Proximal Cleanser Peri-Wound Care Sween Lotion (Moisturizing lotion) Discharge Instruction: Apply moisturizing lotion as directed Topical Primary Dressing Maxorb Extra Ag+  Apply hydrogel as directed Primary Dressing Endoform 2x2 in Discharge Instruction: Moisten with saline Secondary Dressing Optifoam Non-Adhesive Dressing, 4x4 in Discharge Instruction: Apply over primary dressing as directed. Secured With Compression Wrap Kerlix Roll 4.5x3.1 (in/yd) Discharge Instruction: Apply Kerlix and Coban compression as directed. Coban Self-Adherent Wrap 4x5 (in/yd) Discharge Instruction: Apply over Kerlix as directed. Compression Stockings Add-Ons Electronic Signature(s) Signed: 05/05/2023 2:40:12 PM By: Erin Deed RN, BSN Entered By: Erin [name] on 05/05/2023 11:10:35 -------------------------------------------------------------------------------- Wound Assessment Details Patient Name: Date of Service: NO RMA [name], [name]. 05/05/2023 2:00 PM Medical Record Number: [id number] Patient Account Number: [id number] Date of Birth/Sex: Treating RN: 1940/07/24 (82 y.o. 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, Gout, Osteoarthritis Clustered Wound:  No Photos Wound Measurements Erin [name], Erin [name] ([id number]) Length: (cm) 0 Width: (cm) 0 Depth: (cm) 0 Area: (cm) 0 Volume: (cm) 0 [id number].pdf Page 8 of 10 % Reduction in Area: 100% % Reduction in Volume: 100% Epithelialization: Large (67-100%) Tunneling: No Undermining: No Wound Description Classification: Full Thickness Without Exposed Support Structures Exudate Amount: None Present Foul Odor After Cleansing: No Slough/Fibrino No Wound Bed Granulation Amount: None Present (0%) Exposed Structure Necrotic Amount: None Present (0%) Fascia Exposed: No Fat Layer (Subcutaneous Tissue) Exposed: No Tendon Exposed: No Muscle Exposed: No Joint Exposed: No Bone Exposed: No Periwound Skin Texture Texture Color No Abnormalities Noted: Yes No Abnormalities Noted: Yes Moisture Temperature / Pain No Abnormalities Noted: Yes Temperature: No Abnormality Electronic Signature(s) Signed: 05/05/2023 2:40:12 PM By: Erin Deed RN, BSN Entered By: Erin [name] on 05/05/2023 11:11:31 -------------------------------------------------------------------------------- Wound Assessment Details Patient Name: Date of Service: NO RMA [name], [name]. 05/05/2023 2:00 PM Medical Record Number: [id number] Patient Account Number: [id number] Date of Birth/Sex: Treating RN: [birth year] (82 y.o. 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, Gout, Osteoarthritis Clustered Wound: No Photos Wound Measurements Length: (cm) 0.4 Width:  (cm) 0.4 Depth: (cm) 0.1 Erin [name], Erin [name] ([id number]) Area: (cm) 0.126 Volume: (cm) 0.013 % Reduction in Area: % Reduction in Volume: Epithelialization: Medium (34-66%) [id number].pdf Page 9 of 10 Tunneling: No Undermining: No Wound Description Classification: Full Thickness Without Exposed Support Wound Margin: Flat and Intact Exudate Amount: Small Exudate Type: Purulent Exudate Color: yellow, brown, green Structures Foul Odor After Cleansing: No Slough/Fibrino Yes Wound Bed Granulation Amount: None Present (0%) Exposed Structure Necrotic Amount: Large (67-100%) Fascia Exposed: No Necrotic Quality: Adherent Slough Fat Layer (Subcutaneous Tissue) Exposed: Yes Tendon Exposed: No Muscle Exposed: No Joint Exposed: No Bone Exposed: No Periwound Skin Texture Texture Color No Abnormalities Noted: Yes No Abnormalities Noted: Yes Moisture Temperature / Pain No Abnormalities Noted: Yes Temperature: No Abnormality Tenderness on Palpation: Yes Treatment Notes Wound #12 (Lower Leg) Wound Laterality: Left, Posterior, Proximal Cleanser Peri-Wound Care Sween Lotion (Moisturizing lotion) Discharge Instruction: Apply moisturizing lotion as directed Topical Primary Dressing Maxorb Extra Ag+  Erin [name], Erin [name] ([id number]) [id number].pdf Page 1 of 10 Visit Report for 05/05/2023 Arrival Information Details Patient Name: Date of Service: NO RMA [name], [name] [name]. 05/05/2023 2:00 PM Medical Record Number: [id number] Patient Account Number: [id number] Date of Birth/Sex: Treating RN: 31-Jan-1941 (82 y.o. Erin [name] Primary Care Jacqulyn Barresi: Erin [name] Other Clinician: Referring Osei Anger: Treating Leyton Brownlee/Extender: [name] [name] in Treatment: 22 Visit Information History Since Last Visit Added or deleted any medications: No Patient Arrived: Ambulatory Any new allergies or adverse reactions: No Arrival Time: 13:52 Had a fall or experienced change in No Accompanied By: daughter activities of daily living that may affect Transfer Assistance: None risk of falls: Patient Identification Verified: Yes Signs or symptoms of abuse/neglect since last visito No Secondary Verification Process Completed: Yes Hospitalized since last visit: No Patient Requires Transmission-Based Precautions: No Implantable device outside of the clinic excluding No Patient Has Alerts: No cellular tissue based products placed in the center since last visit: Has Dressing in Place as Prescribed: Yes Has Compression in Place as Prescribed: Yes Pain Present Now: No Electronic Signature(s) Signed: 05/05/2023 2:40:12 PM By: Erin Deed RN, BSN Entered By: Erin [name] on 05/05/2023 10:56:53 -------------------------------------------------------------------------------- Encounter Discharge Information Details Patient Name: Date of Service: NO RMA [name], [name]. 05/05/2023 2:00 PM Medical Record Number: [id number] Patient Account Number: [id number] Date of Birth/Sex: Treating RN: 03-08-1941 (82 y.o. Erin [name] Primary Care Jensyn Shave: Erin [name] Other Clinician: Referring Hansford Hirt: Treating Lucresha Dismuke/Extender: [name] [name] in Treatment: 22 Encounter Discharge Information Items Post Procedure Vitals Discharge Condition: Stable Temperature (F): 98 Ambulatory Status: Ambulatory Pulse (bpm): 63 Discharge Destination: Home Respiratory Rate (breaths/min): 18 Transportation: Private Auto Blood Pressure (mmHg): 205/78 Accompanied By: daughter Schedule Follow-up Appointment: Yes Clinical Summary of Care: Patient Declined Electronic Signature(s) Signed: 05/05/2023 2:40:12 PM By: Erin Deed RN, BSN Entered By: Erin [name] on 05/05/2023 11:37:37 Erin [name] ([id number]) [id number].pdf Page 2 of 10 -------------------------------------------------------------------------------- Lower Extremity Assessment Details Patient Name: Date of Service: NO RMA [name], [name]. 05/05/2023 2:00 PM Medical Record Number: [id number] Patient Account Number: [id number] Date of Birth/Sex: Treating RN: [birth year] (82 y.o. Erin [name] Primary Care Shayanne Gomm: Erin [name] Other Clinician: Referring Jinx Gilden: Treating Carisha Kantor/Extender: Erin [name], Erin [name] in Treatment: 22 Edema Assessment Assessed: [Left: No] [Right: No] Edema: [Left: Ye] [Right: s] Calf Left: Right: Point of Measurement: From Medial Instep 28 cm Ankle Left: Right: Point of Measurement: From Medial Instep 18.5 cm Vascular Assessment Pulses: Dorsalis Pedis Palpable: [Left:Yes] Extremity colors, hair growth, and conditions: Extremity Color: [Left:Hyperpigmented] Hair Growth on Extremity: [Left:No] Temperature of Extremity: [Left:Warm < 3 seconds] Electronic Signature(s) Signed: 05/05/2023 2:40:12 PM By: Erin Deed RN, BSN Entered By: Erin [name] on 05/05/2023 11:02:44 -------------------------------------------------------------------------------- Multi Wound Chart Details Patient Name: Date of Service: NO RMA [name], [name]. 05/05/2023 2:00  PM Medical Record Number: [id number] Patient Account Number: [id number] Date of Birth/Sex: Treating RN: 07/19/1940 (81 y.o. F) Primary Care Phylicia Mcgaugh: Erin [name] Other Clinician: Referring Yasser Hepp: Treating Mykiah Schmuck/Extender: Erin [name], Erin [name] in Treatment: 22 Vital Signs Height(in): 60 Pulse(bpm): 63 Weight(lbs): 138 Blood Pressure(mmHg): 205/78 Body Mass Index(BMI): 26.9 Temperature(F): 98 Respiratory Rate(breaths/min): 18 [1:Photos:] [12:[id number].pdf Page 3 of 10] Left, Medial Lower Leg Left, Posterior Lower Leg Left, Proximal, Posterior Lower Leg Wound Location: Gradually Appeared Gradually Appeared Gradually Appeared Wounding Event: Venous Leg Ulcer Venous Leg Ulcer Venous Leg Ulcer Primary Etiology:  Alginate Dressing, 2x2 (in/in) Discharge Instruction: Apply to wound bed as instructed Secondary Dressing Woven Gauze Sponge, Non-Sterile 4x4 in Discharge Instruction: Apply over primary dressing as directed. Secured With Compression Wrap Kerlix Roll 4.5x3.1 (in/yd) Discharge Instruction: Apply Kerlix and Coban compression as directed. Coban Self-Adherent Wrap 4x5 (in/yd) Discharge Instruction: Apply over Kerlix as directed. Compression Stockings Add-Ons Electronic Signature(s) Signed: 05/05/2023 2:40:12 PM By: Erin Deed RN, BSN Entered By: Erin [name] on 05/05/2023  11:12:14 -------------------------------------------------------------------------------- Vitals Details Patient Name: Date of Service: NO RMA [name], [name]. 05/05/2023 2:00 PM Medical Record Number: [id number] Patient Account Number: [id number] Date of Birth/Sex: Treating RN: [birth year] (82 y.o. [address], [address] [place], [place] ([id number]) [phone].pdf Page 10 of 10 Primary Care Erin [name]: Erin [name] Other Clinician: Referring Erin [name]: Treating Erin [name]/Extender: Erin [name], Erin [name] in Treatment: 22 Vital Signs Time Taken: 13:57 Temperature (F): 98 Height (in): 60 Pulse (bpm): 63 Weight (lbs): 138 Respiratory Rate (breaths/min): 18 Body Mass Index (BMI): 26.9 Blood Pressure (mmHg): 205/78 Reference Range: 80 - 120 mg / dl Electronic Signature(s) Signed: 05/05/2023 2:40:12 PM By: Erin Deed RN, BSN Entered By: Erin [name] on 05/05/2023 10:57:15

## 2023-05-12 ENCOUNTER — Encounter (HOSPITAL_BASED_OUTPATIENT_CLINIC_OR_DEPARTMENT_OTHER): Payer: Medicare HMO | Attending: General Surgery | Admitting: General Surgery

## 2023-05-12 DIAGNOSIS — E039 Hypothyroidism, unspecified: Secondary | ICD-10-CM | POA: Insufficient documentation

## 2023-05-12 DIAGNOSIS — N183 Chronic kidney disease, stage 3 unspecified: Secondary | ICD-10-CM | POA: Insufficient documentation

## 2023-05-12 DIAGNOSIS — I872 Venous insufficiency (chronic) (peripheral): Secondary | ICD-10-CM | POA: Diagnosis not present

## 2023-05-12 DIAGNOSIS — L97822 Non-pressure chronic ulcer of other part of left lower leg with fat layer exposed: Secondary | ICD-10-CM | POA: Diagnosis not present

## 2023-05-12 DIAGNOSIS — M109 Gout, unspecified: Secondary | ICD-10-CM | POA: Insufficient documentation

## 2023-05-12 DIAGNOSIS — I129 Hypertensive chronic kidney disease with stage 1 through stage 4 chronic kidney disease, or unspecified chronic kidney disease: Secondary | ICD-10-CM | POA: Insufficient documentation

## 2023-05-12 DIAGNOSIS — M1990 Unspecified osteoarthritis, unspecified site: Secondary | ICD-10-CM | POA: Diagnosis not present

## 2023-05-13 NOTE — Progress Notes (Signed)
ZARIN, HAGMANN ([id number]) [id number] Page 1 of 10 Visit Report for 05/12/2023 Chief Complaint Document Details Patient Name: Date of Service: [name], [name] RBA RA N. 05/12/2023 2:00 PM Medical Record Number: [id number] Patient Account Number: [id number] Date of Birth/Sex: Treating RN: 07/13/1940 (82 y.o. F) Primary Care Provider: [name] Other Clinician: Referring Provider: Treating Provider/Extender: [name] Weeks in Treatment: 56 Information Obtained from: Patient Chief Complaint Patient presents for treatment of an open ulcer due to venous insufficiency Electronic Signature(s) Signed: 05/12/2023 2:57:17 PM By: Duanne Guess MD FACS Entered By: Duanne Guess on 05/12/2023 14:57:17 -------------------------------------------------------------------------------- Debridement Details Patient Name: Date of Service: [name], [name] 05/12/2023 2:00 PM Medical Record Number: [id number] Patient Account Number: [id number] Date of Birth/Sex: Treating RN: 1941/01/29 (82 y.o. [name] Primary Care Provider: [name] Other Clinician: Referring Provider: Treating Provider/Extender: [name] Weeks in Treatment: 23 Debridement Performed for Assessment: Wound #1 Left,Medial Lower Leg Performed By: Physician Duanne Guess, MD Debridement Type: Debridement Severity of Erin [name] Pre Debridement: Fat layer exposed Level of Consciousness (Pre-procedure): Awake and Alert Pre-procedure Verification/Time Out Yes - 14:35 Taken: Start Time: 14:37 Pain Control: Lidocaine 4% T opical Solution Percent of Wound Bed Debrided: 100% T Area Debrided (cm): otal 0.16 Erin [name] and other material debrided: Viable, Non-Viable, Slough, Subcutaneous, Slough Level: Skin/Subcutaneous Erin [name] Debridement Description: Excisional Instrument: Curette Bleeding: Minimum Hemostasis Achieved:  Pressure Procedural Pain: 1 Post Procedural Pain: 0 Response to Treatment: Procedure was tolerated well Level of Consciousness (Post- Awake and Alert procedure): Post Debridement Measurements of Total Wound Length: (cm) 0.5 Width: (cm) 0.4 Depth: (cm) 0.1 Volume: (cm) 0.016 Character of Wound/Ulcer Post Debridement: Improved Severity of Erin [name] Post Debridement: Fat layer exposed Erin [name], Erin [name] ([id number]) [id number] Page 2 of 10 Post Procedure Diagnosis Same as Pre-procedure Electronic Signature(s) Signed: 05/12/2023 3:48:24 PM By: Duanne Guess MD FACS Signed: 05/12/2023 5:09:47 PM By: Zenaida Deed RN, BSN Entered By: Zenaida Deed on 05/12/2023 14:39:21 -------------------------------------------------------------------------------- HPI Details Patient Name: Date of Service: [name], [name] 05/12/2023 2:00 PM Medical Record Number: [id number] Patient Account Number: [id number] Date of Birth/Sex: Treating RN: January 19, 1941 (82 y.o. F) Primary Care Provider: [name] Other Clinician: Referring Provider: Treating Provider/Extender: [name] Weeks in Treatment: 37 History of Present Illness HPI Description: ADMISSION 11/26/2022 This is an 82 year old woman who was referred by her primary care provider for a persistent nonhealing wound on her left lower extremity. It has been present since February of this year. 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 I do not see any formal venous reflux studies in the electronic medical record. She is not diabetic. She does not smoke. Her PCP has been washing her leg each week and applying a Kerlix and Coban wrap, but has not performed any formal debridement and has not been using any sort of topical contact layer or ointment. ABI in clinic was noncompressible, but she has a  palpable pedal pulse. 12/10/2022: The wound is a bit smaller and cleaner today. There is still fairly heavy slough on the surface. Edema control is acceptable. 12/16/2022: The wound is measuring the slightest bit smaller today. There is still thick slough on the surface. Edema control is good. 12/23/2022: The wound is a little bit smaller again today. Still with fairly heavy slough accumulation. Edema control is good. She  has a new small wound on the proximal portion of the same leg. It looks as though she may have developed a small blister over the weekend subsequently ruptured. There is minimal slough on the surface. 12/30/2022: The wound is about the same size, but substantially cleaner. There is some hypertrophic granulation Erin [name] starting to emerge. The wound that was new last week has closed but she has a different new wound today on the lateral aspect of her left lower leg. It also appears to have been a blister that opened and ruptured. There is a little bit of slough on the surface. 01/06/2023: She has new wounds today. She has a wound on the PIP knuckle of her left third and fourth toes, as well as an anterior tibial wound. She says that the wrap was too tight and it also looks like when she pushes her foot into her shoe, it shifts the wrapping back to her midfoot causing bunching up of the wrap material and pain. The lateral leg wounds are both smaller with a little slough and eschar present. The original medial leg wound is smaller and more superficial. There is slough on the surface. 01/13/2023: She has new wounds today. The anterior tibial wound has a satellite lesion that has opened up adjacent to it. Has additional wounds on her fifth toe in addition to the existing ones on her third and fourth toe. The lateral leg wounds seem to have healed. The original medial leg wound is also smaller, but she and her daughter have several questions regarding why we are not making forward progress, all of  which are very reasonable. 01/20/2023: The wounds on her toes have healed. There is a tiny wound on the dorsum of her foot with a little bit of slough on it. The anterolateral leg wound has epithelialized quite substantially and the satellite that had opened last week is now closed. The medial leg wound measured narrower today. It has hypertrophic granulation Erin [name] and slough present. 01/27/2023: The small wounds have all healed. The medial leg wound is smaller as well, with some slough on the surface but without reaccumulation of the hypertrophic granulation Erin [name]. 02/03/2023: The only remaining open wound is the left medial leg wound. It is smaller and a bit more superficial. There is still slough accumulation on the surface. No hypertrophic granulation Erin [name]. She had both her venous reflux and lower extremity arterial studies, both of which were essentially normal, although her TBI's are slightly diminished. 02/10/2023: The left medial leg wound has some granulation Erin [name] filling in, but overall, it is still fairly fibrotic. There is some slough on the surface today. 02/18/2023: The moisture balance and surface consistency have both improved. There is some slough over granulation Erin [name]. Edema control is good. 02/24/2023: The wound measured smaller today. Moisture balance is good. Slough has reaccumulated. Edema remains well-controlled. 03/03/2023: The wound is a little bit smaller today. Moisture balance is stable. Minimal slough accumulation. There is more granulation Erin [name] filling in over the fibrotic surface. Edema is well-controlled. 03/10/2023: The wound is smaller again today. Moisture balance is good. Light slough accumulation on the fibrotic surface. Edema is well-controlled. 03/17/2023: The wound is slightly smaller again today. There is minimal slough accumulation but the underlying surface remains quite fibrotic. Edema control is good. 03/24/2023: Although the wound measurements are the same  today, visually it looks smaller. There is still a fairly fibrotic surface underneath and slough. Erin [name], Erin [name] ([id number]) [id number] Page 3 of 10 Edema remains well-controlled. 03/31/2023:  She has a new wound that was identified when her wraps and dressings were removed today. It is just to the midline of the anterior tibial surface. The fat layer is exposed. There is a little bit of eschar and dry skin along with some thin slough. The wound closer to her ankle measured smaller today. The surface continues to improve although portions of it still remain fibrotic. There is slough buildup present. 04/07/2023: The wound that was new last week has nearly closed, but remains open by perhaps about a millimeter. The wound closer to her ankle had no significant change based on measurements, but visually it looks smaller to me. It is a little bit dry today and the surface is still fibrotic. 04/14/2023: The more proximal wound has completely healed. The wound at her ankle is quite a bit smaller with substantial epithelialization. There is just a small open portion of the distal aspect. It is covered with slough and eschar. 04/21/2023: The ankle wound is just the slightest bit smaller but it is shallower. There is slough and eschar accumulation. She has a new wound on her left posteromedial calf. It looks as though there was some friction or pinching, potentially from the Ace bandage. There is a bit of slough on the surface as well as some old blood. 04/28/2023: The ankle wound did not change in size this week. There is a little slough on the surface. The wound on her left posteromedial calf is covered with a layer of eschar. Underneath, it is smaller and clean. Edema control is good. 05/05/2023: The ankle wound has contracted quite a bit this week. It is extremely superficial with just a little thin eschar around the edges. The left posteromedial calf wound is healed. She had a  small pustule just distal to this that was marked as a new wound, but I am not sure that the epidermis is actually violated here. 05/12/2023: The tiny calf wound that was new last week has healed. The ankle wound is smaller again today and very superficial, but has not yet completely closed. Edema control is good. Electronic Signature(s) Signed: 05/12/2023 2:58:00 PM By: Duanne Guess MD FACS Entered By: Duanne Guess on 05/12/2023 14:58:00 -------------------------------------------------------------------------------- Physical Exam Details Patient Name: Date of Service: [name], [name] 05/12/2023 2:00 PM Medical Record Number: [id number] Patient Account Number: [id number] Date of Birth/Sex: Treating RN: 07/01/1941 (82 y.o. F) Primary Care Provider: [name] Other Clinician: Referring Provider: Treating Provider/Extender: [name], [name] in Treatment: 66 Constitutional Hypertensive, asymptomatic. . . . no acute distress. Respiratory Normal work of breathing on room air.. Notes 05/12/2023: The tiny calf wound that was new last week has healed. The ankle wound is smaller again today and very superficial, but has not yet completely closed. Edema control is good. Electronic Signature(s) Signed: 05/12/2023 2:58:27 PM By: Duanne Guess MD FACS Entered By: Duanne Guess on 05/12/2023 14:58:27 -------------------------------------------------------------------------------- Physician Orders Details Patient Name: Date of Service: [name], [name] 05/12/2023 2:00 PM Medical Record Number: [id number] Patient Account Number: [id number] Date of Birth/Sex: Treating RN: [birth year] (82 y.o. [name] Primary Care Provider: [name] Other Clinician: Referring Provider: Treating Provider/Extender: [name] Weeks in Treatment: 56 The following information was scribed by: Masiya, [place],  Ivor Costa ([id number]) [id number] Page 4 of 10 The information was scribed for: Duanne Guess Verbal / Phone Orders: No Diagnosis Coding ICD-10 Coding Code Description [phone] Non-pressure chronic  ulcer of other part of left lower leg with fat layer exposed I87.332 Chronic venous hypertension (idiopathic) with ulcer and inflammation of left lower extremity I10 Essential (primary) hypertension Follow-up Appointments ppointment in 1 week. - Dr. Lady Gary - room 1 Return A Tuesday 11/15 @ 2:00 pm Anesthetic (In clinic) Topical Lidocaine 4% applied to wound bed Bathing/ Shower/ Hygiene May shower with protection but do not get wound dressing(s) wet. Protect dressing(s) with water repellant cover (for example, large plastic bag) or a cast cover and may then take shower. Edema Control - Orders / Instructions Left Lower Extremity Elevate legs to the level of the heart or above for 30 minutes daily and/or when sitting for 3-4 times a day throughout the day. Avoid standing for long periods of time. Exercise regularly Wound Treatment Wound #1 - Lower Leg Wound Laterality: Left, Medial Cleanser: Soap and Water 1 x Per Week/30 Days Discharge Instructions: May shower and wash wound with dial antibacterial soap and water prior to dressing change. Cleanser: Wound Cleanser 1 x Per Week/30 Days Discharge Instructions: Cleanse the wound with wound cleanser prior to applying a clean dressing using gauze sponges, not Erin [name] or cotton balls. Peri-Wound Care: Triamcinolone 15 (g) 1 x Per Week/30 Days Discharge Instructions: Use triamcinolone 15 (g) as directed Peri-Wound Care: Sween Lotion (Moisturizing lotion) 1 x Per Week/30 Days Discharge Instructions: Apply moisturizing lotion as directed Topical: Gentamicin 1 x Per Week/30 Days Discharge Instructions: As directed by physician Topical: Mupirocin Ointment 1 x Per Week/30 Days Discharge Instructions: Apply Mupirocin  (Bactroban) as instructed Topical: Skintegrity Hydrogel 4 (oz) 1 x Per Week/30 Days Discharge Instructions: Apply hydrogel as directed Prim Dressing: Maxorb Extra Ag+ Alginate Dressing, 2x2 (in/in) 1 x Per Week/30 Days ary Discharge Instructions: Apply to wound bed as instructed Secondary Dressing: Optifoam Non-Adhesive Dressing, 4x4 in 1 x Per Week/30 Days Discharge Instructions: Apply over primary dressing as directed. Compression Wrap: Kerlix Roll 4.5x3.1 (in/yd) 1 x Per Week/30 Days Discharge Instructions: Apply Kerlix and Coban compression as directed. Compression Wrap: Coban Self-Adherent Wrap 4x5 (in/yd) 1 x Per Week/30 Days Discharge Instructions: Apply over Kerlix as directed. Electronic Signature(s) Signed: 05/12/2023 3:48:24 PM By: Duanne Guess MD FACS Entered By: Duanne Guess on 05/12/2023 14:59:31 Erin [name] ([id number]) [id number] Page 5 of 10 -------------------------------------------------------------------------------- Problem List Details Patient Name: Date of Service: [name], [name] 05/12/2023 2:00 PM Medical Record Number: [id number] Patient Account Number: [id number] Date of Birth/Sex: Treating RN: [birth year] (82 y.o. [name] Primary Care Provider: [name] Other Clinician: Referring Provider: Treating Provider/Extender: [name] in Treatment: 23 Active Problems ICD-10 Encounter Code Description Active Date MDM Diagnosis L97.822 Non-pressure chronic ulcer of other part of left lower leg with fat layer exposed5/22/2024 No Yes I87.332 Chronic venous hypertension (idiopathic) with ulcer and inflammation of left 11/26/2022 No Yes lower extremity I10 Essential (primary) hypertension 11/26/2022 No Yes Inactive Problems Resolved Problems ICD-10 Code Description Active Date Resolved Date L97.222 Non-pressure chronic ulcer of left calf with fat layer exposed  04/21/2023 04/21/2023 L97.522 Non-pressure chronic ulcer of other part of left foot with fat layer exposed 01/13/2023 01/13/2023 Electronic Signature(s) Signed: 05/12/2023 2:57:03 PM By: Duanne Guess MD FACS Entered By: Duanne Guess on 05/12/2023 14:57:03 -------------------------------------------------------------------------------- Progress Note Details Patient Name: Date of Service: [name], [name] 05/12/2023 2:00 PM Medical Record Number: [id number] Patient Account Number: [id number] Date of Birth/Sex: Treating RN: 09-04-1940 (82 y.o. F) Primary Care Provider: Avva,  [name] Other Clinician: Referring Provider: Treating Provider/Extender: [name], [name] in Treatment: 69 Subjective Chief Complaint Information obtained from Patient Patient presents for treatment of an open ulcer due to venous insufficiency Erin [name], Erin [name] ([id number]) [id number] Page 6 of 10 History of Present Illness (HPI) ADMISSION 11/26/2022 This is an 82 year old woman who was referred by her primary care provider for a persistent nonhealing wound on her left lower extremity. It has been present since February of this year. 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 I do not see any formal venous reflux studies in the electronic medical record. She is not diabetic. She does not smoke. Her PCP has been washing her leg each week and applying a Kerlix and Coban wrap, but has not performed any formal debridement and has not been using any sort of topical contact layer or ointment. ABI in clinic was noncompressible, but she has a palpable pedal pulse. 12/10/2022: The wound is a bit smaller and cleaner today. There is still fairly heavy slough on the surface. Edema control is acceptable. 12/16/2022: The wound is measuring the slightest bit smaller today. There is  still thick slough on the surface. Edema control is good. 12/23/2022: The wound is a little bit smaller again today. Still with fairly heavy slough accumulation. Edema control is good. She has a new small wound on the proximal portion of the same leg. It looks as though she may have developed a small blister over the weekend subsequently ruptured. There is minimal slough on the surface. 12/30/2022: The wound is about the same size, but substantially cleaner. There is some hypertrophic granulation Erin [name] starting to emerge. The wound that was new last week has closed but she has a different new wound today on the lateral aspect of her left lower leg. It also appears to have been a blister that opened and ruptured. There is a little bit of slough on the surface. 01/06/2023: She has new wounds today. She has a wound on the PIP knuckle of her left third and fourth toes, as well as an anterior tibial wound. She says that the wrap was too tight and it also looks like when she pushes her foot into her shoe, it shifts the wrapping back to her midfoot causing bunching up of the wrap material and pain. The lateral leg wounds are both smaller with a little slough and eschar present. The original medial leg wound is smaller and more superficial. There is slough on the surface. 01/13/2023: She has new wounds today. The anterior tibial wound has a satellite lesion that has opened up adjacent to it. Has additional wounds on her fifth toe in addition to the existing ones on her third and fourth toe. The lateral leg wounds seem to have healed. The original medial leg wound is also smaller, but she and her daughter have several questions regarding why we are not making forward progress, all of which are very reasonable. 01/20/2023: The wounds on her toes have healed. There is a tiny wound on the dorsum of her foot with a little bit of slough on it. The anterolateral leg wound has epithelialized quite substantially and the  satellite that had opened last week is now closed. The medial leg wound measured narrower today. It has hypertrophic granulation Erin [name] and slough present. 01/27/2023: The small wounds have all healed. The medial leg wound is smaller as well, with some slough  on the surface but without reaccumulation of the hypertrophic granulation Erin [name]. 02/03/2023: The only remaining open wound is the left medial leg wound. It is smaller and a bit more superficial. There is still slough accumulation on the surface. No hypertrophic granulation Erin [name]. She had both her venous reflux and lower extremity arterial studies, both of which were essentially normal, although her TBI's are slightly diminished. 02/10/2023: The left medial leg wound has some granulation Erin [name] filling in, but overall, it is still fairly fibrotic. There is some slough on the surface today. 02/18/2023: The moisture balance and surface consistency have both improved. There is some slough over granulation Erin [name]. Edema control is good. 02/24/2023: The wound measured smaller today. Moisture balance is good. Slough has reaccumulated. Edema remains well-controlled. 03/03/2023: The wound is a little bit smaller today. Moisture balance is stable. Minimal slough accumulation. There is more granulation Erin [name] filling in over the fibrotic surface. Edema is well-controlled. 03/10/2023: The wound is smaller again today. Moisture balance is good. Light slough accumulation on the fibrotic surface. Edema is well-controlled. 03/17/2023: The wound is slightly smaller again today. There is minimal slough accumulation but the underlying surface remains quite fibrotic. Edema control is good. 03/24/2023: Although the wound measurements are the same today, visually it looks smaller. There is still a fairly fibrotic surface underneath and slough. Edema remains well-controlled. 03/31/2023: She has a new wound that was identified when her wraps and dressings were removed today. It  is just to the midline of the anterior tibial surface. The fat layer is exposed. There is a little bit of eschar and dry skin along with some thin slough. The wound closer to her ankle measured smaller today. The surface continues to improve although portions of it still remain fibrotic. There is slough buildup present. 04/07/2023: The wound that was new last week has nearly closed, but remains open by perhaps about a millimeter. The wound closer to her ankle had no significant change based on measurements, but visually it looks smaller to me. It is a little bit dry today and the surface is still fibrotic. 04/14/2023: The more proximal wound has completely healed. The wound at her ankle is quite a bit smaller with substantial epithelialization. There is just a small open portion of the distal aspect. It is covered with slough and eschar. 04/21/2023: The ankle wound is just the slightest bit smaller but it is shallower. There is slough and eschar accumulation. She has a new wound on her left posteromedial calf. It looks as though there was some friction or pinching, potentially from the Ace bandage. There is a bit of slough on the surface as well as some old blood. 04/28/2023: The ankle wound did not change in size this week. There is a little slough on the surface. The wound on her left posteromedial calf is covered with a layer of eschar. Underneath, it is smaller and clean. Edema control is good. 05/05/2023: The ankle wound has contracted quite a bit this week. It is extremely superficial with just a little thin eschar around the edges. The left posteromedial calf wound is healed. She had a small pustule just distal to this that was marked as a new wound, but I am not sure that the epidermis is actually violated here. 05/12/2023: The tiny calf wound that was new last week has healed. The ankle wound is smaller again today and very superficial, but has not yet completely closed. Edema control is  good. Patient History Family History Cancer - Siblings, Lung  Disease - Siblings, No family history of Diabetes, Heart Disease, Hereditary Spherocytosis, Hypertension, Kidney Disease, Seizures, Stroke, Thyroid Problems, Tuberculosis. Social History Never smoker, Marital Status - Widowed, Alcohol Use - Never, Drug Use - No History, Caffeine Use - Moderate. Erin [name], Erin [name] ([id number]) [id number] Page 7 of 10 Medical History Eyes Patient has history of Cataracts Cardiovascular Patient has history of Arrhythmia - bundle branch block, Hypertension, Peripheral Venous Disease Musculoskeletal Patient has history of Gout, Osteoarthritis Hospitalization/Surgery History - Salpingoophorectomy. - Colon resection. - Appendectomy. - Cystoscopy w/ retrogrades. - Joint replacement. - Mastectomy right. - gastric ulcer repair. - Abdominal hysterectomy. Medical A Surgical History Notes nd Hematologic/Lymphatic thrombocytopenia Gastrointestinal diverticulosis, GERD Endocrine hypothyroidism Genitourinary CKD stage III Musculoskeletal DJD, osteoporosis Oncologic breast cancer Objective Constitutional Hypertensive, asymptomatic. no acute distress. Vitals Time Taken: 1:49 AM, Height: 60 in, Weight: 138 lbs, BMI: 26.9, Temperature: 97.5 F, Pulse: 63 bpm, Respiratory Rate: 18 breaths/min, Blood Pressure: 191/72 mmHg. Respiratory Normal work of breathing on room air.. General Notes: 05/12/2023: The tiny calf wound that was new last week has healed. The ankle wound is smaller again today and very superficial, but has not yet completely closed. Edema control is good. Integumentary (Hair, Skin) Wound #1 status is Open. Original cause of wound was Gradually Appeared. The date acquired was: 08/29/2022. The wound has been in treatment 23 weeks. The wound is located on the Left,Medial Lower Leg. The wound measures 0.5cm length x 0.4cm width x 0.1cm depth; 0.157cm^2 area and  0.016cm^3 volume. There is Fat Layer (Subcutaneous Erin [name]) exposed. There is no tunneling or undermining noted. There is a small amount of serous drainage noted. The wound margin is flat and intact. There is large (67-100%) pink granulation within the wound bed. There is no necrotic Erin [name] within the wound bed. The periwound skin appearance had no abnormalities noted for texture. The periwound skin appearance had no abnormalities noted for moisture. The periwound skin appearance had no abnormalities noted for color. Periwound temperature was noted as No Abnormality. Wound #12 status is Healed - Epithelialized. Original cause of wound was Gradually Appeared. The date acquired was: 05/05/2023. The wound has been in treatment 1 weeks. The wound is located on the Left,Proximal,Posterior Lower Leg. The wound measures 0cm length x 0cm width x 0cm depth; 0cm^2 area and 0cm^3 volume. There is no tunneling or undermining noted. There is a none present amount of drainage noted. There is no granulation within the wound bed. There is no necrotic Erin [name] within the wound bed. The periwound skin appearance had no abnormalities noted for texture. The periwound skin appearance had no abnormalities noted for moisture. The periwound skin appearance had no abnormalities noted for color. Periwound temperature was noted as No Abnormality. 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 There was a Excisional Skin/Subcutaneous Erin [name] Debridement with a total area of 0.16 sq cm performed by Duanne Guess, MD. With the following instrument(s): Curette to  remove Viable and Non-Viable Erin [name]/material. Material removed includes Subcutaneous Erin [name] and Slough and after achieving pain Erin [name], Erin [name] ([id number]) [id number] Page 8 of 10 control using Lidocaine 4% Topical Solution. No specimens were taken. A time out was conducted at 14:35, prior to the start  of the procedure. A Minimum amount of bleeding was controlled with Pressure. The procedure was tolerated well with a pain level of 1 throughout and a pain level of 0 following the procedure. Post Debridement Measurements: 0.5cm length x 0.4cm width x 0.1cm depth; 0.016cm^3 volume. Character of Wound/Ulcer Post Debridement is improved. Severity of Erin [name] Post Debridement is: Fat layer exposed. Post procedure Diagnosis Wound #1: Same as Pre-Procedure Plan Follow-up Appointments: Return Appointment in 1 week. - Dr. Lady Gary - room 1 Tuesday 11/15 @ 2:00 pm Anesthetic: (In clinic) Topical Lidocaine 4% applied to wound bed Bathing/ Shower/ Hygiene: May shower with protection but do not get wound dressing(s) wet. Protect dressing(s) with water repellant cover (for example, large plastic bag) or a cast cover and may then take shower. Edema Control - Orders / Instructions: Elevate legs to the level of the heart or above for 30 minutes daily and/or when sitting for 3-4 times a day throughout the day. Avoid standing for long periods of time. Exercise regularly WOUND #1: - Lower Leg Wound Laterality: Left, Medial Cleanser: Soap and Water 1 x Per Week/30 Days Discharge Instructions: May shower and wash wound with dial antibacterial soap and water prior to dressing change. Cleanser: Wound Cleanser 1 x Per Week/30 Days Discharge Instructions: Cleanse the wound with wound cleanser prior to applying a clean dressing using gauze sponges, not Erin [name] or cotton balls. Peri-Wound Care: Triamcinolone 15 (g) 1 x Per Week/30 Days Discharge Instructions: Use triamcinolone 15 (g) as  directed Peri-Wound Care: Sween Lotion (Moisturizing lotion) 1 x Per Week/30 Days Discharge Instructions: Apply moisturizing lotion as directed Topical: Gentamicin 1 x Per Week/30 Days Discharge Instructions: As directed by physician Topical: Mupirocin Ointment 1 x Per Week/30 Days Discharge Instructions: Apply Mupirocin (Bactroban) as instructed Topical: Skintegrity Hydrogel 4 (oz) 1 x Per Week/30 Days Discharge Instructions: Apply hydrogel as directed Prim Dressing: Maxorb Extra Ag+ Alginate Dressing, 2x2 (in/in) 1 x Per Week/30 Days ary Discharge Instructions: Apply to wound bed as instructed Secondary Dressing: Optifoam Non-Adhesive Dressing, 4x4 in 1 x Per Week/30 Days Discharge Instructions: Apply over primary dressing as directed. Com pression Wrap: Kerlix Roll 4.5x3.1 (in/yd) 1 x Per Week/30 Days Discharge Instructions: Apply Kerlix and Coban compression as directed. Com pression Wrap: Coban Self-Adherent Wrap 4x5 (in/yd) 1 x Per Week/30 Days Discharge Instructions: Apply over Kerlix as directed. 05/12/2023: The tiny calf wound that was new last week has healed. The ankle wound is smaller again today and very superficial, but has not yet completely closed. Edema control is good. I used a curette to debride slough and subcutaneous Erin [name] from her wound. I am going to see if we can get it to completely close up by changing the contact layer to silver alginate. Continue topical gentamicin and mupirocin with Kerlix and Coban. Follow up in one week. Electronic Signature(s) Signed: 05/12/2023 3:00:39 PM By: Duanne Guess MD FACS Entered By: Duanne Guess on 05/12/2023 15:00:38 -------------------------------------------------------------------------------- HxROS Details Patient Name: Date of Service: [name], [name] 05/12/2023 2:00 PM Medical Record Number: [id number] Patient Account Number: [id number] Date of Birth/Sex: Treating RN: 11/02/1940 (82 y.o. F) Primary Care  Provider: [name] Other Clinician: Referring Provider: Treating Provider/Extender: [name], [name] in Treatment: 21 Eyes Medical History: Positive for: Erin [name], Erin [name] ([id number]) [id number] Page 9 of 10 Hematologic/Lymphatic Medical History: Past Medical History Notes: thrombocytopenia Cardiovascular Medical History: Positive for: Arrhythmia - bundle branch block; Hypertension; Peripheral Venous Disease Gastrointestinal Medical History: Past  Medical History Notes: diverticulosis, GERD Endocrine Medical History: Past Medical History Notes: hypothyroidism Genitourinary Medical History: Past Medical History Notes: CKD stage III Musculoskeletal Medical History: Positive for: Gout; Osteoarthritis Past Medical History Notes: DJD, osteoporosis Oncologic Medical History: Past Medical History Notes: breast cancer HBO Extended History Items Eyes: Cataracts Immunizations Pneumococcal Vaccine: Received Pneumococcal Vaccination: Yes Received Pneumococcal Vaccination On or After 60th Birthday: No Implantable Devices No devices added Hospitalization / Surgery History Type of Hospitalization/Surgery Salpingoophorectomy Colon resection Appendectomy Cystoscopy w/ retrogrades Joint replacement Mastectomy right gastric ulcer repair Abdominal hysterectomy Family and Social History Cancer: Yes - Siblings; Diabetes: No; Heart Disease: No; Hereditary Spherocytosis: No; Hypertension: No; Kidney Disease: No; Lung Disease: Yes - Siblings; Seizures: No; Stroke: No; Thyroid Problems: No; Tuberculosis: No; Never smoker; Marital Status - Widowed; Alcohol Use: Never; Drug Use: No History; Caffeine Use: Moderate; Financial Concerns: No; Food, Clothing or Shelter Needs: No; Support System Lacking: No; Transportation Concerns: No Electronic Signature(s) Signed: 05/12/2023 3:48:24 PM By: Duanne Guess MD  FACS Entered By: Duanne Guess on 05/12/2023 14:58:06 Erin [name] ([id number]) [id number] Page 10 of 10 -------------------------------------------------------------------------------- SuperBill Details Patient Name: Date of Service: [name], [name] 05/12/2023 Medical Record Number: [id number] Patient Account Number: [id number] Date of Birth/Sex: Treating RN: [birth year] (82 y.o. F) Primary Care Provider: [name] Other Clinician: Referring Provider: Treating Provider/Extender: [name], [name] in Treatment: 23 Diagnosis Coding ICD-10 Codes Code Description [phone] Non-pressure chronic ulcer of other part of left lower leg with fat layer exposed I87.332 Chronic venous hypertension (idiopathic) with ulcer and inflammation of left lower extremity I10 Essential (primary) hypertension Facility Procedures : CPT4 Code: [id number] Description: 11042 - DEB SUBQ Erin [name] 20 SQ CM/< ICD-10 Diagnosis Description L97.822 Non-pressure chronic ulcer of other part of left lower leg with fat layer expo Modifier: sed Quantity: 1 Physician Procedures : CPT4 Code Description Modifier [id number] - WC PHYS LEVEL 4 - EST PT ICD-10 Diagnosis Description L97.822 Non-pressure chronic ulcer of other part of left lower leg with fat layer exposed I87.332 Chronic venous hypertension (idiopathic) with ulcer  and inflammation of left lower extremity I10 Essential (primary) hypertension Quantity: 1 : [id number] 11042 - WC PHYS SUBQ TISS 20 SQ CM ICD-10 Diagnosis Description L97.822 Non-pressure chronic ulcer of other part of left lower leg with fat layer exposed Quantity: 1 Electronic Signature(s) Signed: 05/12/2023 3:00:59 PM By: Duanne Guess MD FACS Entered By: Duanne Guess on 05/12/2023 15:00:59

## 2023-05-13 NOTE — Progress Notes (Signed)
Erin [name] ([id number]) [id number] Page 1 of 9 Visit Report for 05/12/2023 Arrival Information Details Patient Name: Date of Service: Erin [name], [name] [name]. 05/12/2023 2:00 PM Medical Record Number: [id number] Patient Account Number: [id number] Date of Birth/Sex: Treating RN: 1941-02-19 (82 y.o. F) Primary Care Loi Rennaker: [name] Other Clinician: Referring Bobbi Yount: Treating Blaine Guiffre/Extender: [name] Weeks in Treatment: 11 Visit Information History Since Last Visit Added or deleted any medications: Erin Patient Arrived: Ambulatory Any new allergies or adverse reactions: Erin Arrival Time: 13:48 Had a fall or experienced change in Erin Accompanied By: daughter activities of daily living that may affect Transfer Assistance: None risk of falls: Patient Identification Verified: Yes Signs or symptoms of abuse/neglect since last visito Erin Secondary Verification Process Completed: Yes Hospitalized since last visit: Erin Patient Requires Transmission-Based Precautions: Erin Implantable device outside of the clinic excluding Erin Patient Has Alerts: Erin cellular tissue based products placed in the center since last visit: Has Dressing in Place as Prescribed: Yes Has Compression in Place as Prescribed: Yes Pain Present Now: Erin Electronic Signature(s) Signed: 05/12/2023 5:09:47 PM By: Zenaida Deed RN, BSN Entered By: Zenaida Deed on 05/12/2023 11:19:21 -------------------------------------------------------------------------------- Encounter Discharge Information Details Patient Name: Date of Service: Erin [name], Erin [name]. 05/12/2023 2:00 PM Medical Record Number: [id number] Patient Account Number: [id number] Date of Birth/Sex: Treating RN: 1940-10-15 (82 y.o. Erin [name] Primary Care Meiling Hendriks: [name] Other Clinician: Referring Tierre Netto: Treating Jaxxon Naeem/Extender: [name] Weeks in Treatment: 75 Encounter Discharge Information Items Post Procedure Vitals Discharge Condition: Stable Temperature (F): 97.5 Ambulatory Status: Ambulatory Pulse (bpm): 63 Discharge Destination: Home Respiratory Rate (breaths/min): 18 Transportation: Private Auto Blood Pressure (mmHg): 191/72 Accompanied By: daughter Schedule Follow-up Appointment: Yes Clinical Summary of Care: Patient Declined Electronic Signature(s) Signed: 05/12/2023 5:09:47 PM By: Zenaida Deed RN, BSN Entered By: Zenaida Deed on 05/12/2023 11:51:29 Erin [name] ([id number]) [id number] Page 2 of 9 -------------------------------------------------------------------------------- Lower Extremity Assessment Details Patient Name: Date of Service: Erin [name], Erin [name]. 05/12/2023 2:00 PM Medical Record Number: [id number] Patient Account Number: [id number] Date of Birth/Sex: Treating RN: Jul 01, 1941 (82 y.o. Erin [name] Primary Care Aleesia Henney: [name] Other Clinician: Referring Shiri Hodapp: Treating Maleik Vanderzee/Extender: [name], [name] in Treatment: 23 Edema Assessment Assessed: [Left: Erin] [Right: Erin] Edema: [Left: Ye] [Right: s] Calf Left: Right: Point of Measurement: From Medial Instep 28 cm Ankle Left: Right: Point of Measurement: From Medial Instep 18 cm Vascular Assessment Pulses: Dorsalis Pedis Palpable: [Left:Yes] Extremity colors, hair growth, and conditions: Extremity Color: [Left:Hyperpigmented] Hair Growth on Extremity: [Left:Erin] Temperature of Extremity: [Left:Warm < 3 seconds] Electronic Signature(s) Signed: 05/12/2023 5:09:47 PM By: Zenaida Deed RN, BSN Entered By: Zenaida Deed on 05/12/2023 11:20:49 -------------------------------------------------------------------------------- Multi Wound Chart Details Patient Name: Date of Service: Erin [name], Erin [name]. 05/12/2023 2:00 PM Medical Record Number:  [id number] Patient Account Number: [id number] Date of Birth/Sex: Treating RN: [birth year] (82 y.o. F) Primary Care Goro Wenrick: [name] Other Clinician: Referring Tikesha Mort: Treating Allean Montfort/Extender: [name], [name] in Treatment: 23 Vital Signs Height(in): 60 Pulse(bpm): 63 Weight(lbs): 138 Blood Pressure(mmHg): 191/72 Body Mass Index(BMI): 26.9 Temperature(F): 97.5 Respiratory Rate(breaths/min): 18 [1:Photos:] [[name]:[name] [id number] Page 3 of 9] Left, Medial Lower Leg Left, Proximal, Posterior Lower Leg [name] Wound Location: Gradually Appeared Gradually Appeared [name] Wounding Event: Venous Leg Ulcer Venous Leg Ulcer [name] Primary Etiology: Cataracts, Arrhythmia, Hypertension, Cataracts, Arrhythmia, Hypertension, [name]  Comorbid History: Peripheral Venous Disease, Gout, Peripheral Venous Disease, Gout, Osteoarthritis Osteoarthritis 08/29/2022 05/05/2023 [name] Date Acquired: 23 1 [name] Weeks of Treatment: Open Healed - Epithelialized [name] Wound Status: Erin Erin [name] Wound Recurrence: 0.5x0.4x0.1 0x0x0 [name] Measurements L x W x D (cm) 0.157 0 [name] A (cm) : rea 0.016 0 [name] Volume (cm) : 97.30% 100.00% [name] % Reduction in A rea: 97.30% 100.00% [name] % Reduction in Volume: Full Thickness Without Exposed Full Thickness Without Exposed [name] Classification: Support Structures Support Structures Small None Present [name] Exudate A mount: Serous [name] [name] Exudate Type: amber [name] [name] Exudate Color: Flat and Intact [name] [name] Wound Margin: Large (67-100%) None Present (0%) [name] Granulation A mount: Pink [name] [name] Granulation Quality: None Present (0%) None Present (0%) [name] Necrotic A mount: Fat Layer (Subcutaneous Tissue): Yes Fascia: Erin [name] Exposed Structures: Fascia: Erin Fat Layer (Subcutaneous Tissue): Erin Tendon: Erin Tendon: Erin Muscle: Erin Muscle: Erin Joint: Erin Joint: Erin Bone: Erin Bone: Erin Large (67-100%) Large (67-100%)  [name] Epithelialization: Debridement - Excisional [name] [name] Debridement: Pre-procedure Verification/Time Out 14:35 [name] [name] Taken: Lidocaine 4% Topical Solution [name] [name] Pain Control: Subcutaneous, Slough [name] [name] Tissue Debrided: Skin/Subcutaneous Tissue [name] [name] Level: 0.16 [name] [name] Debridement A (sq cm): rea Curette [name] [name] Instrument: Minimum [name] [name] Bleeding: Pressure [name] [name] Hemostasis A chieved: 1 [name] [name] Procedural Pain: 0 [name] [name] Post Procedural Pain: Procedure was tolerated well [name] [name] Debridement Treatment Response: 0.5x0.4x0.1 [name] [name] Post Debridement Measurements L x W x D (cm) 0.016 [name] [name] Post Debridement Volume: (cm) Erin Abnormalities Noted Erin Abnormalities Noted [name] Periwound Skin Texture: Erin Abnormalities Noted Erin Abnormalities Noted [name] Periwound Skin Moisture: Erin Abnormalities Noted Erin Abnormalities Noted [name] Periwound Skin Color: Erin Abnormality Erin Abnormality [name] Temperature: Debridement [name] [name] Procedures Performed: Treatment Notes Wound #1 (Lower Leg) Wound Laterality: Left, Medial Cleanser Soap and Water Discharge Instruction: May shower and wash wound with dial antibacterial soap and water prior to dressing change. Wound Cleanser Discharge Instruction: Cleanse the wound with wound cleanser prior to applying a clean dressing using gauze sponges, not tissue or cotton balls. Peri-Wound Care Triamcinolone 15 (g) Discharge Instruction: Use triamcinolone 15 (g) as directed Sween Lotion (Moisturizing lotion) Discharge Instruction: Apply moisturizing lotion as directed Topical Gentamicin Discharge Instruction: As directed by physician Mupirocin Ointment Discharge Instruction: Apply Mupirocin (Bactroban) as instructed Erin [name], Erin [name] ([id number]) [id number] Page 4 of 9 Skintegrity Hydrogel 4 (oz) Discharge Instruction: Apply hydrogel as directed Primary Dressing Maxorb Extra Ag+ Alginate Dressing, 2x2  (in/in) Discharge Instruction: Apply to wound bed as instructed Secondary Dressing Optifoam Non-Adhesive Dressing, 4x4 in Discharge Instruction: Apply over primary dressing as directed. Secured With Compression Wrap Kerlix Roll 4.5x3.1 (in/yd) Discharge Instruction: Apply Kerlix and Coban compression as directed. Coban Self-Adherent Wrap 4x5 (in/yd) Discharge Instruction: Apply over Kerlix as directed. Compression Stockings Add-Ons Electronic Signature(s) Signed: 05/12/2023 2:57:11 PM By: Duanne Guess MD FACS Entered By: Duanne Guess on 05/12/2023 11:57:11 -------------------------------------------------------------------------------- Multi-Disciplinary Care Plan Details Patient Name: Date of Service: Erin [name], Erin [name]. 05/12/2023 2:00 PM Medical Record Number: [id number] Patient Account Number: [id number] Date of Birth/Sex: Treating RN: 04/30/1941 (82 y.o. Erin [name] Primary Care Mikle Sternberg: [name] Other Clinician: Referring Oswin Griffith: Treating Shannia Jacuinde/Extender: [name] in Treatment: 63 Multidisciplinary Care Plan reviewed with physician Active Inactive Necrotic Tissue Nursing Diagnoses: Impaired tissue integrity related to necrotic/devitalized tissue Knowledge deficit related to management of necrotic/devitalized tissue Goals: Necrotic/devitalized tissue will be minimized  in the wound bed Date Initiated: 11/26/2022 Target Resolution Date: 06/05/2023 Goal Status: Active Patient/caregiver will verbalize understanding of reason and process for debridement of necrotic tissue Date Initiated: 11/26/2022 Date Inactivated: 01/20/2023 Target Resolution Date: 03/06/2023 Goal Status: Met Interventions: Assess patient pain level pre-, during and post procedure and prior to discharge Provide education on necrotic tissue and debridement process Treatment Activities: Apply topical anesthetic as ordered :  11/26/2022 Notes: Wound/Skin Impairment Erin [name], Erin [name] ([id number]) [id number] Page 5 of 9 Nursing Diagnoses: Impaired tissue integrity Knowledge deficit related to ulceration/compromised skin integrity Goals: Patient/caregiver will verbalize understanding of skin care regimen Date Initiated: 11/26/2022 Target Resolution Date: 06/05/2023 Goal Status: Active Interventions: Assess ulceration(s) every visit Treatment Activities: Skin care regimen initiated : 11/26/2022 Topical wound management initiated : 11/26/2022 Notes: Electronic Signature(s) Signed: 05/12/2023 5:09:47 PM By: Zenaida Deed RN, BSN Entered By: Zenaida Deed on 05/12/2023 11:25:00 -------------------------------------------------------------------------------- Pain Assessment Details Patient Name: Date of Service: Erin [name], Erin [name]. 05/12/2023 2:00 PM Medical Record Number: [id number] Patient Account Number: [id number] Date of Birth/Sex: Treating RN: Jan 16, 1941 (82 y.o. F) Primary Care Yuepheng Schaller: [name] Other Clinician: Referring Ronak Duquette: Treating Tujuana Kilmartin/Extender: [name], [name] in Treatment: 23 Active Problems Location of Pain Severity and Description of Pain Patient Has Paino Erin Site Locations Rate the pain. Current Pain Level: 0 Pain Management and Medication Current Pain Management: Electronic Signature(s) Signed: 05/12/2023 5:09:47 PM By: Zenaida Deed RN, BSN Entered By: Zenaida Deed on 05/12/2023 11:19:36 Erin [name] ([id number]) [id number] Page 6 of 9 -------------------------------------------------------------------------------- Patient/Caregiver Education Details Patient Name: Date of Service: Erin [name], Erin [name]. 11/5/2024andnbsp2:00 PM Medical Record Number: [id number] Patient Account Number: [id number] Date of Birth/Gender: Treating RN: 1941/06/28 (82 y.o. Erin [name] Primary Care Physician: [name] Other Clinician: Referring Physician: Treating Physician/Extender: [name] in Treatment: 51 Education Assessment Education Provided To: Patient Education Topics Provided Venous: Methods: Explain/Verbal Responses: Reinforcements needed, State content correctly Wound/Skin Impairment: Methods: Explain/Verbal Responses: Reinforcements needed, State content correctly Electronic Signature(s) Signed: 05/12/2023 5:09:47 PM By: Zenaida Deed RN, BSN Entered By: Zenaida Deed on 05/12/2023 11:25:50 -------------------------------------------------------------------------------- Wound Assessment Details Patient Name: Date of Service: Erin [name], Erin [name]. 05/12/2023 2:00 PM Medical Record Number: [id number] Patient Account Number: [id number] Date of Birth/Sex: Treating RN: 1941/05/18 (82 y.o. Erin [name] Primary Care Erby Sanderson: [name] Other Clinician: Referring Taliana Mersereau: Treating Nekita Pita/Extender: [name], [name] in Treatment: 23 Wound Status Wound Number: 1 Primary Venous Leg Ulcer Etiology: Wound Location: Left, Medial Lower Leg Wound Open Wounding Event: Gradually Appeared Status: Date Acquired: 08/29/2022 Comorbid Cataracts, Arrhythmia, Hypertension, Peripheral Venous Weeks Of Treatment: 23 History: Disease, Gout, Osteoarthritis Clustered Wound: Erin Photos Erin [name], Erin [name] ([id number]) [id number] Page 7 of 9 Wound Measurements Length: (cm) 0.5 Width: (cm) 0.4 Depth: (cm) 0.1 Area: (cm) 0.157 Volume: (cm) 0.016 % Reduction in Area: 97.3% % Reduction in Volume: 97.3% Epithelialization: Large (67-100%) Tunneling: Erin Undermining: Erin Wound Description Classification: Full Thickness Without Exposed Support Structures Wound Margin: Flat and Intact Exudate Amount: Small Exudate Type: Serous Exudate Color: amber Foul  Odor After Cleansing: Erin Slough/Fibrino Yes Wound Bed Granulation Amount: Large (67-100%) Exposed Structure Granulation Quality: Pink Fascia Exposed: Erin Necrotic Amount: None Present (0%) Fat Layer (Subcutaneous Tissue) Exposed: Yes Tendon Exposed: Erin Muscle Exposed: Erin Joint Exposed: Erin Bone Exposed: Erin Periwound Skin Texture Texture Color Erin Abnormalities Noted: Yes Erin Abnormalities Noted: Yes Moisture  Temperature / Pain Erin Abnormalities Noted: Yes Temperature: Erin Abnormality Treatment Notes Wound #1 (Lower Leg) Wound Laterality: Left, Medial Cleanser Soap and Water Discharge Instruction: May shower and wash wound with dial antibacterial soap and water prior to dressing change. Wound Cleanser Discharge Instruction: Cleanse the wound with wound cleanser prior to applying a clean dressing using gauze sponges, not tissue or cotton balls. Peri-Wound Care Triamcinolone 15 (g) Discharge Instruction: Use triamcinolone 15 (g) as directed Sween Lotion (Moisturizing lotion) Discharge Instruction: Apply moisturizing lotion as directed Topical Gentamicin Discharge Instruction: As directed by physician Mupirocin Ointment Discharge Instruction: Apply Mupirocin (Bactroban) as instructed Skintegrity Hydrogel 4 (oz) Discharge Instruction: Apply hydrogel as directed Primary Dressing Maxorb Extra Ag+ Alginate Dressing, 2x2 (in/in) Discharge Instruction: Apply to wound bed as instructed Secondary Dressing Optifoam Non-Adhesive Dressing, 4x4 in Discharge Instruction: Apply over primary dressing as directed. Secured With Compression Wrap Kerlix Roll 4.5x3.1 (in/yd) Discharge Instruction: Apply Kerlix and Coban compression as directed. Coban Self-Adherent Wrap 4x5 (in/yd) Discharge Instruction: Apply over Kerlix as directed. Compression Stockings Erin [name], Erin [name] ([id number]) [id number] Page 8 of 9 Add-Ons Electronic Signature(s) Signed: 05/12/2023 5:09:47  PM By: Zenaida Deed RN, BSN Entered By: Zenaida Deed on 05/12/2023 11:23:49 -------------------------------------------------------------------------------- Wound Assessment Details Patient Name: Date of Service: Erin [name], Erin [name]. 05/12/2023 2:00 PM Medical Record Number: [id number] Patient Account Number: [id number] Date of Birth/Sex: Treating RN: 03-05-1941 (82 y.o. Erin [name] Primary Care Jamani Bearce: [name] Other Clinician: Referring Thoms Barthelemy: Treating Kechia Yahnke/Extender: [name], [name] in Treatment: 23 Wound Status Wound Number: 12 Primary Venous Leg Ulcer Etiology: Wound Location: Left, Proximal, Posterior Lower Leg Wound Healed - Epithelialized Wounding Event: Gradually Appeared Status: Date Acquired: 05/05/2023 Comorbid Cataracts, Arrhythmia, Hypertension, Peripheral Venous Weeks Of Treatment: 1 History: Disease, Gout, Osteoarthritis Clustered Wound: Erin Photos Wound Measurements Length: (cm) Width: (cm) Depth: (cm) Area: (cm) Volume: (cm) 0 % Reduction in Area: 100% 0 % Reduction in Volume: 100% 0 Epithelialization: Large (67-100%) 0 Tunneling: Erin 0 Undermining: Erin Wound Description Classification: Full Thickness Without Exposed Support Structures Exudate Amount: None Present Foul Odor After Cleansing: Erin Slough/Fibrino Erin Wound Bed Granulation Amount: None Present (0%) Exposed Structure Necrotic Amount: None Present (0%) Fascia Exposed: Erin Fat Layer (Subcutaneous Tissue) Exposed: Erin Tendon Exposed: Erin Muscle Exposed: Erin Joint Exposed: Erin Bone Exposed: Erin Periwound Skin Texture Texture Color Erin Abnormalities Noted: Yes Erin Abnormalities Noted: Yes Moisture Temperature / Pain Erin Abnormalities Noted: Yes Temperature: Erin Abnormality Erin [name], Erin [name] ([id number]) [id number] Page 9 of 9 Electronic Signature(s) Signed: 05/12/2023 5:09:47 PM By: Zenaida Deed RN, BSN Entered  By: Zenaida Deed on 05/12/2023 11:24:44 -------------------------------------------------------------------------------- Vitals Details Patient Name: Date of Service: Erin [name], Erin [name]. 05/12/2023 2:00 PM Medical Record Number: [id number] Patient Account Number: [id number] Date of Birth/Sex: Treating RN: 15-Apr-1941 (82 y.o. F) Primary Care Liset Mcmonigle: [name] Other Clinician: Referring Holden Maniscalco: Treating Casy Tavano/Extender: [name], [name] in Treatment: 23 Vital Signs Time Taken: 01:49 Temperature (F): 97.5 Height (in): 60 Pulse (bpm): 63 Weight (lbs): 138 Respiratory Rate (breaths/min): 18 Body Mass Index (BMI): 26.9 Blood Pressure (mmHg): 191/72 Reference Range: 80 - 120 mg / dl Electronic Signature(s) Signed: 05/12/2023 5:09:47 PM By: Zenaida Deed RN, BSN Entered By: Zenaida Deed on 05/12/2023 11:19:26

## 2023-05-19 ENCOUNTER — Encounter (HOSPITAL_BASED_OUTPATIENT_CLINIC_OR_DEPARTMENT_OTHER): Payer: Medicare HMO | Admitting: General Surgery

## 2023-05-19 DIAGNOSIS — E039 Hypothyroidism, unspecified: Secondary | ICD-10-CM | POA: Diagnosis not present

## 2023-05-19 DIAGNOSIS — M1990 Unspecified osteoarthritis, unspecified site: Secondary | ICD-10-CM | POA: Diagnosis not present

## 2023-05-19 DIAGNOSIS — I129 Hypertensive chronic kidney disease with stage 1 through stage 4 chronic kidney disease, or unspecified chronic kidney disease: Secondary | ICD-10-CM | POA: Diagnosis not present

## 2023-05-19 DIAGNOSIS — N183 Chronic kidney disease, stage 3 unspecified: Secondary | ICD-10-CM | POA: Diagnosis not present

## 2023-05-19 DIAGNOSIS — M109 Gout, unspecified: Secondary | ICD-10-CM | POA: Diagnosis not present

## 2023-05-19 DIAGNOSIS — L97822 Non-pressure chronic ulcer of other part of left lower leg with fat layer exposed: Secondary | ICD-10-CM | POA: Diagnosis not present

## 2023-05-19 DIAGNOSIS — I872 Venous insufficiency (chronic) (peripheral): Secondary | ICD-10-CM | POA: Diagnosis not present

## 2023-05-19 NOTE — Progress Notes (Signed)
MACKINLEY, ROORDA ([id number]) [id number] Page 1 of 10 Visit Report for 05/19/2023 Chief Complaint Document Details Patient Name: Date of Service: [name], [name] RBA RA N. 05/19/2023 2:00 PM Medical Record Number: [id number] Patient Account Number: [id number] Date of Birth/Sex: Treating RN: 1940-07-26 (82 y.o. F) Primary Care Provider: [name] Other Clinician: Referring Provider: Treating Provider/Extender: [name] Weeks in Treatment: 24 Information Obtained from: Patient Chief Complaint Patient presents for treatment of an open ulcer due to venous insufficiency Electronic Signature(s) Signed: 05/19/2023 2:10:40 PM By: Duanne Guess MD FACS Entered By: Duanne Guess on 05/19/2023 11:10:40 -------------------------------------------------------------------------------- Debridement Details Patient Name: Date of Service: [name], [name] 05/19/2023 2:00 PM Medical Record Number: [id number] Patient Account Number: [id number] Date of Birth/Sex: Treating RN: 1941-05-30 (82 y.o. [name], [name] Primary Care Provider: [name] Other Clinician: Referring Provider: Treating Provider/Extender: [name] Weeks in Treatment: 24 Debridement Performed for Assessment: Wound #1 Left,Medial Lower Leg Performed By: Physician Duanne Guess, MD The following information was scribed by: Zenaida Deed The information was scribed for: Duanne Guess Debridement Type: Debridement Severity of Tissue Pre Debridement: Fat layer exposed Level of Consciousness (Pre-procedure): Awake and Alert Pre-procedure Verification/Time Out Yes - 14:00 Taken: Start Time: 14:03 Pain Control: Lidocaine 4% T opical Solution Percent of Wound Bed Debrided: 100% T Area Debrided (cm): otal 0.57 Tissue and other material debrided: Viable, Non-Viable, Slough, Subcutaneous, Slough Level: Skin/Subcutaneous  Tissue Debridement Description: Excisional Instrument: Curette Bleeding: Minimum Hemostasis Achieved: Pressure Procedural Pain: 2 Post Procedural Pain: 1 Response to Treatment: Procedure was tolerated well Level of Consciousness (Post- Awake and Alert procedure): Post Debridement Measurements of Total Wound Length: (cm) 0.9 Width: (cm) 0.8 Depth: (cm) 0.1 Volume: (cm) 0.057 Erin [name], Erin [name] ([id number]) [id number] Page 2 of 10 Character of Wound/Ulcer Post Debridement: Improved Severity of Tissue Post Debridement: Fat layer exposed Post Procedure Diagnosis Same as Pre-procedure Electronic Signature(s) Signed: 05/19/2023 2:54:12 PM By: Duanne Guess MD FACS Signed: 05/19/2023 4:40:40 PM By: Zenaida Deed RN, BSN Entered By: Zenaida Deed on 05/19/2023 11:06:15 -------------------------------------------------------------------------------- HPI Details Patient Name: Date of Service: [name], [name] 05/19/2023 2:00 PM Medical Record Number: [id number] Patient Account Number: [id number] Date of Birth/Sex: Treating RN: [birth year] (82 y.o. F) Primary Care Provider: [name] Other Clinician: Referring Provider: Treating Provider/Extender: [name] Weeks in Treatment: 24 History of Present Illness HPI Description: ADMISSION 11/26/2022 This is an 82 year old woman who was referred by her primary care provider for a persistent nonhealing wound on her left lower extremity. It has been present since February of this year. 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 I do not see any formal venous reflux studies in the electronic medical record. She is not diabetic. She does not smoke. Her PCP has been washing her leg each week and applying a Kerlix and Coban wrap, but has not performed any formal debridement and has not  been using any sort of topical contact layer or ointment. ABI in clinic was noncompressible, but she has a palpable pedal pulse. 12/10/2022: The wound is a bit smaller and cleaner today. There is still fairly heavy slough on the surface. Edema control is acceptable. 12/16/2022: The wound is measuring the slightest bit smaller today. There is still thick slough on the surface. Edema control is good. 12/23/2022: The wound is a little  bit smaller again today. Still with fairly heavy slough accumulation. Edema control is good. She has a new small wound on the proximal portion of the same leg. It looks as though she may have developed a small blister over the weekend subsequently ruptured. There is minimal slough on the surface. 12/30/2022: The wound is about the same size, but substantially cleaner. There is some hypertrophic granulation tissue starting to emerge. The wound that was new last week has closed but she has a different new wound today on the lateral aspect of her left lower leg. It also appears to have been a blister that opened and ruptured. There is a little bit of slough on the surface. 01/06/2023: She has new wounds today. She has a wound on the PIP knuckle of her left third and fourth toes, as well as an anterior tibial wound. She says that the wrap was too tight and it also looks like when she pushes her foot into her shoe, it shifts the wrapping back to her midfoot causing bunching up of the wrap material and pain. The lateral leg wounds are both smaller with a little slough and eschar present. The original medial leg wound is smaller and more superficial. There is slough on the surface. 01/13/2023: She has new wounds today. The anterior tibial wound has a satellite lesion that has opened up adjacent to it. Has additional wounds on her fifth toe in addition to the existing ones on her third and fourth toe. The lateral leg wounds seem to have healed. The original medial leg wound is also smaller,  but she and her daughter have several questions regarding why we are not making forward progress, all of which are very reasonable. 01/20/2023: The wounds on her toes have healed. There is a tiny wound on the dorsum of her foot with a little bit of slough on it. The anterolateral leg wound has epithelialized quite substantially and the satellite that had opened last week is now closed. The medial leg wound measured narrower today. It has hypertrophic granulation tissue and slough present. 01/27/2023: The small wounds have all healed. The medial leg wound is smaller as well, with some slough on the surface but without reaccumulation of the hypertrophic granulation tissue. 02/03/2023: The only remaining open wound is the left medial leg wound. It is smaller and a bit more superficial. There is still slough accumulation on the surface. No hypertrophic granulation tissue. She had both her venous reflux and lower extremity arterial studies, both of which were essentially normal, although her TBI's are slightly diminished. 02/10/2023: The left medial leg wound has some granulation tissue filling in, but overall, it is still fairly fibrotic. There is some slough on the surface today. 02/18/2023: The moisture balance and surface consistency have both improved. There is some slough over granulation tissue. Edema control is good. 02/24/2023: The wound measured smaller today. Moisture balance is good. Slough has reaccumulated. Edema remains well-controlled. 03/03/2023: The wound is a little bit smaller today. Moisture balance is stable. Minimal slough accumulation. There is more granulation tissue filling in over the fibrotic surface. Edema is well-controlled. 03/10/2023: The wound is smaller again today. Moisture balance is good. Light slough accumulation on the fibrotic surface. Edema is well-controlled. 03/17/2023: The wound is slightly smaller again today. There is minimal slough accumulation but the underlying  surface remains quite fibrotic. Edema control is good. Erin [name], Erin [name] ([id number]) [id number] Page 3 of 10 03/24/2023: Although the wound measurements are the same today, visually it looks  smaller. There is still a fairly fibrotic surface underneath and slough. Edema remains well-controlled. 03/31/2023: She has a new wound that was identified when her wraps and dressings were removed today. It is just to the midline of the anterior tibial surface. The fat layer is exposed. There is a little bit of eschar and dry skin along with some thin slough. The wound closer to her ankle measured smaller today. The surface continues to improve although portions of it still remain fibrotic. There is slough buildup present. 04/07/2023: The wound that was new last week has nearly closed, but remains open by perhaps about a millimeter. The wound closer to her ankle had no significant change based on measurements, but visually it looks smaller to me. It is a little bit dry today and the surface is still fibrotic. 04/14/2023: The more proximal wound has completely healed. The wound at her ankle is quite a bit smaller with substantial epithelialization. There is just a small open portion of the distal aspect. It is covered with slough and eschar. 04/21/2023: The ankle wound is just the slightest bit smaller but it is shallower. There is slough and eschar accumulation. She has a new wound on her left posteromedial calf. It looks as though there was some friction or pinching, potentially from the Ace bandage. There is a bit of slough on the surface as well as some old blood. 04/28/2023: The ankle wound did not change in size this week. There is a little slough on the surface. The wound on her left posteromedial calf is covered with a layer of eschar. Underneath, it is smaller and clean. Edema control is good. 05/05/2023: The ankle wound has contracted quite a bit this week. It is extremely  superficial with just a little thin eschar around the edges. The left posteromedial calf wound is healed. She had a small pustule just distal to this that was marked as a new wound, but I am not sure that the epidermis is actually violated here. 05/12/2023: The tiny calf wound that was new last week has healed. The ankle wound is smaller again today and very superficial, but has not yet completely closed. Edema control is good. 05/19/2023: Unfortunately, the ankle wound enlarged over the past week. No malodor or purulent drainage to suggest infection as the etiology and her edema control remains good. Electronic Signature(s) Signed: 05/19/2023 2:11:34 PM By: Duanne Guess MD FACS Entered By: Duanne Guess on 05/19/2023 11:11:34 -------------------------------------------------------------------------------- Physical Exam Details Patient Name: Date of Service: [name], [name] 05/19/2023 2:00 PM Medical Record Number: [id number] Patient Account Number: [id number] Date of Birth/Sex: Treating RN: 1941-05-01 (82 y.o. F) Primary Care Provider: [name] Other Clinician: Referring Provider: Treating Provider/Extender: [name], [name] in Treatment: 24 Constitutional Hypertensive, asymptomatic. . . . no acute distress. Respiratory Normal work of breathing on room air.. Notes 05/19/2023: Unfortunately, the ankle wound enlarged over the past week. No malodor or purulent drainage to suggest infection as the etiology and her edema control remains good. Electronic Signature(s) Signed: 05/19/2023 2:14:24 PM By: Duanne Guess MD FACS Entered By: Duanne Guess on 05/19/2023 11:14:23 -------------------------------------------------------------------------------- Physician Orders Details Patient Name: Date of Service: [name], [name] 05/19/2023 2:00 PM Medical Record Number: [id number] Patient Account Number: [id number] Date of Birth/Sex:  Treating RN: [birth year] (82 y.o. [address], [address] [place], [place] ([id number]) [id number] Page 4 of 10 Primary Care Provider: [name] Other Clinician: Referring Provider: Treating Provider/Extender: Lady Gary,  Fabio Pierce, [name] in Treatment: 24 The following information was scribed by: Zenaida Deed The information was scribed for: Duanne Guess Verbal / Phone Orders: No Diagnosis Coding ICD-10 Coding Code Description [phone] Non-pressure chronic ulcer of other part of left lower leg with fat layer exposed I87.332 Chronic venous hypertension (idiopathic) with ulcer and inflammation of left lower extremity I10 Essential (primary) hypertension Follow-up Appointments ppointment in 1 week. - Dr. Lady Gary - room 1 Return A Tuesday 11/19 @ 2:00 pm Anesthetic (In clinic) Topical Lidocaine 4% applied to wound bed Bathing/ Shower/ Hygiene May shower with protection but do not get wound dressing(s) wet. Protect dressing(s) with water repellant cover (for example, large plastic bag) or a cast cover and may then take shower. Wound Treatment Wound #1 - Lower Leg Wound Laterality: Left, Medial Cleanser: Soap and Water 1 x Per Week/30 Days Discharge Instructions: May shower and wash wound with dial antibacterial soap and water prior to dressing change. Cleanser: Wound Cleanser 1 x Per Week/30 Days Discharge Instructions: Cleanse the wound with wound cleanser prior to applying a clean dressing using gauze sponges, not tissue or cotton balls. Peri-Wound Care: Triamcinolone 15 (g) 1 x Per Week/30 Days Discharge Instructions: Use triamcinolone 15 (g) as directed Peri-Wound Care: Sween Lotion (Moisturizing lotion) 1 x Per Week/30 Days Discharge Instructions: Apply moisturizing lotion as directed Topical: Gentamicin 1 x Per Week/30 Days Discharge Instructions: As directed by physician Topical: Mupirocin Ointment 1 x Per Week/30  Days Discharge Instructions: Apply Mupirocin (Bactroban) as instructed Topical: Skintegrity Hydrogel 4 (oz) 1 x Per Week/30 Days Discharge Instructions: Apply hydrogel as directed Prim Dressing: Endoform 2x2 in 1 x Per Week/30 Days ary Discharge Instructions: Moisten with saline Secondary Dressing: Optifoam Non-Adhesive Dressing, 4x4 in 1 x Per Week/30 Days Discharge Instructions: Apply over primary dressing as directed. Compression Wrap: Kerlix Roll 4.5x3.1 (in/yd) 1 x Per Week/30 Days Discharge Instructions: Apply Kerlix and Coban compression as directed. Compression Wrap: Coban Self-Adherent Wrap 4x5 (in/yd) 1 x Per Week/30 Days Discharge Instructions: Apply over Kerlix as directed. Electronic Signature(s) Signed: 05/19/2023 2:14:39 PM By: Duanne Guess MD FACS Entered By: Duanne Guess on 05/19/2023 11:14:38 Erin [name] ([id number]) [id number] Page 5 of 10 -------------------------------------------------------------------------------- Problem List Details Patient Name: Date of Service: [name], [name] 05/19/2023 2:00 PM Medical Record Number: [id number] Patient Account Number: [id number] Date of Birth/Sex: Treating RN: 1940-07-17 (82 y.o. [name] Primary Care Provider: [name] Other Clinician: Referring Provider: Treating Provider/Extender: [name] Weeks in Treatment: 24 Active Problems ICD-10 Encounter Code Description Active Date MDM Diagnosis [phone] Non-pressure chronic ulcer of other part of left lower leg with fat layer exposed5/22/2024 No Yes I87.332 Chronic venous hypertension (idiopathic) with ulcer and inflammation of left 11/26/2022 No Yes lower extremity I10 Essential (primary) hypertension 11/26/2022 No Yes Inactive Problems Resolved Problems ICD-10 Code Description Active Date Resolved Date L97.222 Non-pressure chronic ulcer of left calf with fat layer exposed  04/21/2023 04/21/2023 L97.522 Non-pressure chronic ulcer of other part of left foot with fat layer exposed 01/13/2023 01/13/2023 Electronic Signature(s) Signed: 05/19/2023 2:07:53 PM By: Duanne Guess MD FACS Entered By: Duanne Guess on 05/19/2023 11:07:52 -------------------------------------------------------------------------------- Progress Note Details Patient Name: Date of Service: [name], [name] 05/19/2023 2:00 PM Medical Record Number: [id number] Patient Account Number: [id number] Date of Birth/Sex: Treating RN: 05-26-1941 (82 y.o. F) Primary Care Provider: [name] Other Clinician: Referring Provider: Treating Provider/Extender: [name], [name]  Weeks in Treatment: 24 Subjective Chief Complaint Information obtained from Patient Patient presents for treatment of an open ulcer due to venous insufficiency History of Present Illness (HPI) ADMISSION 11/26/2022 This is an 82 year old woman who was referred by her primary care provider for a persistent nonhealing wound on her left lower extremity. It has been present since February of this year. 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 I do not see any formal venous reflux studies in the electronic medical record. She is not diabetic. She does not smoke. Her PCP has been Erin [name], Erin [name] ([id number]) [id number] Page 6 of 10 washing her leg each week and applying a Kerlix and Coban wrap, but has not performed any formal debridement and has not been using any sort of topical contact layer or ointment. ABI in clinic was noncompressible, but she has a palpable pedal pulse. 12/10/2022: The wound is a bit smaller and cleaner today. There is still fairly heavy slough on the surface. Edema control is acceptable. 12/16/2022: The wound is measuring the slightest bit smaller today. There  is still thick slough on the surface. Edema control is good. 12/23/2022: The wound is a little bit smaller again today. Still with fairly heavy slough accumulation. Edema control is good. She has a new small wound on the proximal portion of the same leg. It looks as though she may have developed a small blister over the weekend subsequently ruptured. There is minimal slough on the surface. 12/30/2022: The wound is about the same size, but substantially cleaner. There is some hypertrophic granulation tissue starting to emerge. The wound that was new last week has closed but she has a different new wound today on the lateral aspect of her left lower leg. It also appears to have been a blister that opened and ruptured. There is a little bit of slough on the surface. 01/06/2023: She has new wounds today. She has a wound on the PIP knuckle of her left third and fourth toes, as well as an anterior tibial wound. She says that the wrap was too tight and it also looks like when she pushes her foot into her shoe, it shifts the wrapping back to her midfoot causing bunching up of the wrap material and pain. The lateral leg wounds are both smaller with a little slough and eschar present. The original medial leg wound is smaller and more superficial. There is slough on the surface. 01/13/2023: She has new wounds today. The anterior tibial wound has a satellite lesion that has opened up adjacent to it. Has additional wounds on her fifth toe in addition to the existing ones on her third and fourth toe. The lateral leg wounds seem to have healed. The original medial leg wound is also smaller, but she and her daughter have several questions regarding why we are not making forward progress, all of which are very reasonable. 01/20/2023: The wounds on her toes have healed. There is a tiny wound on the dorsum of her foot with a little bit of slough on it. The anterolateral leg wound has epithelialized quite substantially and the  satellite that had opened last week is now closed. The medial leg wound measured narrower today. It has hypertrophic granulation tissue and slough present. 01/27/2023: The small wounds have all healed. The medial leg wound is smaller as well, with some slough on the surface but without reaccumulation of the hypertrophic granulation tissue. 02/03/2023: The  only remaining open wound is the left medial leg wound. It is smaller and a bit more superficial. There is still slough accumulation on the surface. No hypertrophic granulation tissue. She had both her venous reflux and lower extremity arterial studies, both of which were essentially normal, although her TBI's are slightly diminished. 02/10/2023: The left medial leg wound has some granulation tissue filling in, but overall, it is still fairly fibrotic. There is some slough on the surface today. 02/18/2023: The moisture balance and surface consistency have both improved. There is some slough over granulation tissue. Edema control is good. 02/24/2023: The wound measured smaller today. Moisture balance is good. Slough has reaccumulated. Edema remains well-controlled. 03/03/2023: The wound is a little bit smaller today. Moisture balance is stable. Minimal slough accumulation. There is more granulation tissue filling in over the fibrotic surface. Edema is well-controlled. 03/10/2023: The wound is smaller again today. Moisture balance is good. Light slough accumulation on the fibrotic surface. Edema is well-controlled. 03/17/2023: The wound is slightly smaller again today. There is minimal slough accumulation but the underlying surface remains quite fibrotic. Edema control is good. 03/24/2023: Although the wound measurements are the same today, visually it looks smaller. There is still a fairly fibrotic surface underneath and slough. Edema remains well-controlled. 03/31/2023: She has a new wound that was identified when her wraps and dressings were removed today. It  is just to the midline of the anterior tibial surface. The fat layer is exposed. There is a little bit of eschar and dry skin along with some thin slough. The wound closer to her ankle measured smaller today. The surface continues to improve although portions of it still remain fibrotic. There is slough buildup present. 04/07/2023: The wound that was new last week has nearly closed, but remains open by perhaps about a millimeter. The wound closer to her ankle had no significant change based on measurements, but visually it looks smaller to me. It is a little bit dry today and the surface is still fibrotic. 04/14/2023: The more proximal wound has completely healed. The wound at her ankle is quite a bit smaller with substantial epithelialization. There is just a small open portion of the distal aspect. It is covered with slough and eschar. 04/21/2023: The ankle wound is just the slightest bit smaller but it is shallower. There is slough and eschar accumulation. She has a new wound on her left posteromedial calf. It looks as though there was some friction or pinching, potentially from the Ace bandage. There is a bit of slough on the surface as well as some old blood. 04/28/2023: The ankle wound did not change in size this week. There is a little slough on the surface. The wound on her left posteromedial calf is covered with a layer of eschar. Underneath, it is smaller and clean. Edema control is good. 05/05/2023: The ankle wound has contracted quite a bit this week. It is extremely superficial with just a little thin eschar around the edges. The left posteromedial calf wound is healed. She had a small pustule just distal to this that was marked as a new wound, but I am not sure that the epidermis is actually violated here. 05/12/2023: The tiny calf wound that was new last week has healed. The ankle wound is smaller again today and very superficial, but has not yet completely closed. Edema control is  good. 05/19/2023: Unfortunately, the ankle wound enlarged over the past week. No malodor or purulent drainage to suggest infection as the etiology  and her edema control remains good. Patient History Family History Cancer - Siblings, Lung Disease - Siblings, No family history of Diabetes, Heart Disease, Hereditary Spherocytosis, Hypertension, Kidney Disease, Seizures, Stroke, Thyroid Problems, Tuberculosis. Social History Never smoker, Marital Status - Widowed, Alcohol Use - Never, Drug Use - No History, Caffeine Use - Moderate. Medical History Eyes Patient has history of Cataracts Cardiovascular Erin [name], Erin [name] ([id number]) [id number] Page 7 of 10 Patient has history of Arrhythmia - bundle branch block, Hypertension, Peripheral Venous Disease Musculoskeletal Patient has history of Gout, Osteoarthritis Hospitalization/Surgery History - Salpingoophorectomy. - Colon resection. - Appendectomy. - Cystoscopy w/ retrogrades. - Joint replacement. - Mastectomy right. - gastric ulcer repair. - Abdominal hysterectomy. Medical A Surgical History Notes nd Hematologic/Lymphatic thrombocytopenia Gastrointestinal diverticulosis, GERD Endocrine hypothyroidism Genitourinary CKD stage III Musculoskeletal DJD, osteoporosis Oncologic breast cancer Objective Constitutional Hypertensive, asymptomatic. no acute distress. Vitals Time Taken: 1:49 PM, Height: 60 in, Weight: 138 lbs, BMI: 26.9, Temperature: 98.1 F, Pulse: 63 bpm, Respiratory Rate: 18 breaths/min, Blood Pressure: 190/78 mmHg. Respiratory Normal work of breathing on room air.. General Notes: 05/19/2023: Unfortunately, the ankle wound enlarged over the past week. No malodor or purulent drainage to suggest infection as the etiology and her edema control remains good. Integumentary (Hair, Skin) Wound #1 status is Open. Original cause of wound was Gradually Appeared. The date acquired was: 08/29/2022. The wound  has been in treatment 24 weeks. The wound is located on the Left,Medial Lower Leg. The wound measures 0.9cm length x 0.8cm width x 0.1cm depth; 0.565cm^2 area and 0.057cm^3 volume. There is Fat Layer (Subcutaneous Tissue) exposed. There is no tunneling or undermining noted. There is a medium amount of serosanguineous drainage noted. The wound margin is flat and intact. There is medium (34-66%) pink granulation within the wound bed. There is a medium (34-66%) amount of necrotic tissue within the wound bed including Adherent Slough. The periwound skin appearance had no abnormalities noted for texture. The periwound skin appearance had no abnormalities noted for moisture. The periwound skin appearance had no abnormalities noted for color. Periwound temperature was noted as No Abnormality. 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 There was a Excisional Skin/Subcutaneous Tissue Debridement with a total area of 0.57 sq cm performed by Duanne Guess, MD. With the following instrument(s): Curette to remove Viable and Non-Viable tissue/material. Material removed includes Subcutaneous Tissue and Slough and after achieving pain control using Lidocaine 4% T opical Solution. No specimens were taken. A time out was conducted at 14:00, prior to the start of the procedure. A Minimum amount of bleeding was controlled with Pressure. The procedure was tolerated well with a pain level of 2 throughout and a pain level of 1 following the procedure. Post Debridement Measurements: 0.9cm length x 0.8cm width x 0.1cm depth; 0.057cm^3 volume. Character  of Wound/Ulcer Post Debridement is improved. Severity of Tissue Post Debridement is: Fat layer exposed. Post procedure Diagnosis Wound #1: Same as Pre-Procedure Erin [name], Erin [name] ([id number]) [phone].pdf Page 8 of 10 Plan Follow-up Appointments: Return Appointment in 1 week. - Dr. Lady Gary - room 1 Tuesday 11/19 @ 2:00 pm Anesthetic: (In clinic) Topical Lidocaine 4% applied to wound bed Bathing/ Shower/ Hygiene: May shower with  protection but do not get wound dressing(s) wet. Protect dressing(s) with water repellant cover (for example, large plastic bag) or a cast cover and may then take shower. WOUND #1: - Lower Leg Wound Laterality: Left, Medial Cleanser: Soap and Water 1 x Per Week/30 Days Discharge Instructions: May shower and wash wound with dial antibacterial soap and water prior to dressing change. Cleanser: Wound Cleanser 1 x Per Week/30 Days Discharge Instructions: Cleanse the wound with wound cleanser prior to applying a clean dressing using gauze sponges, not tissue or cotton balls. Peri-Wound Care: Triamcinolone 15 (g) 1 x Per Week/30 Days Discharge Instructions: Use triamcinolone 15 (g) as directed Peri-Wound Care: Sween Lotion (Moisturizing lotion) 1 x Per Week/30 Days Discharge Instructions: Apply moisturizing lotion as directed Topical: Gentamicin 1 x Per Week/30 Days Discharge Instructions: As directed by physician Topical: Mupirocin Ointment 1 x Per Week/30 Days Discharge Instructions: Apply Mupirocin (Bactroban) as instructed Topical: Skintegrity Hydrogel 4 (oz) 1 x Per Week/30 Days Discharge Instructions: Apply hydrogel as directed Prim Dressing: Endoform 2x2 in 1 x Per Week/30 Days ary Discharge Instructions: Moisten with saline Secondary Dressing: Optifoam Non-Adhesive Dressing, 4x4 in 1 x Per Week/30 Days Discharge Instructions: Apply over primary dressing as directed. Com pression Wrap: Kerlix Roll 4.5x3.1 (in/yd) 1 x Per Week/30  Days Discharge Instructions: Apply Kerlix and Coban compression as directed. Com pression Wrap: Coban Self-Adherent Wrap 4x5 (in/yd) 1 x Per Week/30 Days Discharge Instructions: Apply over Kerlix as directed. 05/19/2023: Unfortunately, the ankle wound enlarged over the past week. No malodor or purulent drainage to suggest infection as the etiology and her edema control remains good. I used a curette to debride slough and subcutaneous tissue from the wound. The only difference between last week and this is that we had changed her contact layer to silver alginate. I am going to go back to the topical gentamicin and mupirocin with endoform, covering the site with Optifoam before Kerlix and Coban compression wrap. Follow-up in 1 week. Electronic Signature(s) Signed: 05/19/2023 2:15:45 PM By: Duanne Guess MD FACS Entered By: Duanne Guess on 05/19/2023 11:15:45 -------------------------------------------------------------------------------- HxROS Details Patient Name: Date of Service: [name], [name] 05/19/2023 2:00 PM Medical Record Number: [id number] Patient Account Number: [id number] Date of Birth/Sex: Treating RN: Jul 24, 1940 (82 y.o. F) Primary Care Provider: [name] Other Clinician: Referring Provider: Treating Provider/Extender: [name], [name] in Treatment: 24 Eyes Medical History: Positive for: Cataracts Hematologic/Lymphatic Medical History: Past Medical History Notes: thrombocytopenia Cardiovascular Medical History: Positive for: Arrhythmia - bundle branch block; Hypertension; Peripheral Venous Disease Erin [name], Erin [name] ([id number]) [phone].pdf Page 9 of 10 Gastrointestinal Medical History: Past Medical History Notes: diverticulosis, GERD Endocrine Medical History: Past Medical History Notes: hypothyroidism Genitourinary Medical History: Past Medical History Notes: CKD stage  III Musculoskeletal Medical History: Positive for: Gout; Osteoarthritis Past Medical History Notes: DJD, osteoporosis Oncologic Medical History: Past Medical History Notes: breast cancer HBO Extended History Items Eyes: Cataracts Immunizations Pneumococcal Vaccine: Received Pneumococcal Vaccination: Yes Received Pneumococcal Vaccination On or After 60th Birthday: No Implantable Devices No devices added Hospitalization / Surgery History Type of Hospitalization/Surgery Salpingoophorectomy Colon resection Appendectomy Cystoscopy w/ retrogrades Joint replacement Mastectomy right gastric ulcer repair Abdominal hysterectomy Family and Social History Cancer: Yes - Siblings; Diabetes: No; Heart Disease: No; Hereditary Spherocytosis: No; Hypertension: No; Kidney Disease: No; Lung Disease: Yes - Siblings; Seizures: No; Stroke: No; Thyroid Problems: No; Tuberculosis: No; Never smoker; Marital Status - Widowed; Alcohol Use: Never; Drug Use: No History; Caffeine Use:  Moderate; Financial Concerns: No; Food, Clothing or Shelter Needs: No; Support System Lacking: No; Transportation Concerns: No Electronic Signature(s) Signed: 05/19/2023 2:54:12 PM By: Duanne Guess MD FACS Entered By: Duanne Guess on 05/19/2023 11:12:38 -------------------------------------------------------------------------------- SuperBill Details Patient Name: Date of Service: [name], [name] 05/19/2023 Medical Record Number: [id number] Patient Account Number: [id number] Erin [name], Erin [name] ([id number]) [id number] Page 10 of 10 Date of Birth/Sex: Treating RN: 1941/03/14 (82 y.o. F) Primary Care Provider: [name] Other Clinician: Referring Provider: Treating Provider/Extender: [name], [name] in Treatment: 24 Diagnosis Coding ICD-10 Codes Code Description [phone] Non-pressure chronic ulcer of other part of left lower leg with fat layer  exposed I87.332 Chronic venous hypertension (idiopathic) with ulcer and inflammation of left lower extremity I10 Essential (primary) hypertension Facility Procedures : CPT4 Code: [id number] Description: 11042 - DEB SUBQ TISSUE 20 SQ CM/< ICD-10 Diagnosis Description L97.822 Non-pressure chronic ulcer of other part of left lower leg with fat layer expo Modifier: sed Quantity: 1 Physician Procedures : CPT4 Code Description Modifier [id number] 99214 - WC PHYS LEVEL 4 - EST PT ICD-10 Diagnosis Description L97.822 Non-pressure chronic ulcer of other part of left lower leg with fat layer exposed I87.332 Chronic venous hypertension (idiopathic) with ulcer  and inflammation of left lower extremity I10 Essential (primary) hypertension Quantity: 1 : [id number] 11042 - WC PHYS SUBQ TISS 20 SQ CM ICD-10 Diagnosis Description L97.822 Non-pressure chronic ulcer of other part of left lower leg with fat layer exposed Quantity: 1 Electronic Signature(s) Signed: 05/19/2023 2:16:04 PM By: Duanne Guess MD FACS Entered By: Duanne Guess on 05/19/2023 11:16:04

## 2023-05-19 NOTE — Progress Notes (Signed)
Erin [name], Erin [name] ([id number]) [id number] Page 1 of 7 Visit Report for 05/19/2023 Arrival Information Details Patient Name: Date of Service: [name], [name] RBA RA N. 05/19/2023 2:00 PM Medical Record Number: [id number] Patient Account Number: [id number] Date of Birth/Sex: Treating RN: Mar 07, 1941 (82 y.o. [name] Primary Care Kvion Shapley: [name] Other Clinician: Referring Gustavo Meditz: Treating Jamell Laymon/Extender: [name] Weeks in Treatment: 24 Visit Information History Since Last Visit Added or deleted any medications: No Patient Arrived: Ambulatory Any new allergies or adverse reactions: No Arrival Time: 13:48 Had a fall or experienced change in No Accompanied By: daughter activities of daily living that may affect Transfer Assistance: None risk of falls: Patient Identification Verified: Yes Signs or symptoms of abuse/neglect since last visito No Secondary Verification Process Completed: Yes Hospitalized since last visit: No Patient Requires Transmission-Based Precautions: No Implantable device outside of the clinic excluding No Patient Has Alerts: No cellular tissue based products placed in the center since last visit: Has Dressing in Place as Prescribed: Yes Has Compression in Place as Prescribed: Yes Pain Present Now: No Electronic Signature(s) Signed: 05/19/2023 4:40:40 PM By: Zenaida Deed RN, BSN Entered By: Zenaida Deed on 05/19/2023 10:49:41 -------------------------------------------------------------------------------- Encounter Discharge Information Details Patient Name: Date of Service: [name], [name] 05/19/2023 2:00 PM Medical Record Number: [id number] Patient Account Number: [id number] Date of Birth/Sex: Treating RN: 1941-04-28 (82 y.o. [name] Primary Care Javell Blackburn: [name] Other Clinician: Referring Cecia Egge: Treating Aramis Zobel/Extender: [name] Weeks in Treatment: 24 Encounter Discharge Information Items Post Procedure Vitals Discharge Condition: Stable Temperature (F): 98.1 Ambulatory Status: Ambulatory Pulse (bpm): 63 Discharge Destination: Home Respiratory Rate (breaths/min): 18 Transportation: Private Auto Blood Pressure (mmHg): 190/78 Accompanied By: daughter Schedule Follow-up Appointment: Yes Clinical Summary of Care: Patient Declined Electronic Signature(s) Signed: 05/19/2023 4:40:40 PM By: Zenaida Deed RN, BSN Entered By: Zenaida Deed on 05/19/2023 11:22:38 Erin [name] ([id number]) [id number] Page 2 of 7 -------------------------------------------------------------------------------- Lower Extremity Assessment Details Patient Name: Date of Service: [name], [name] 05/19/2023 2:00 PM Medical Record Number: [id number] Patient Account Number: [id number] Date of Birth/Sex: Treating RN: Dec 29, 1940 (82 y.o. [name] Primary Care Shomari Scicchitano: [name] Other Clinician: Referring Levert Heslop: Treating Bindu Docter/Extender: [name], [name] in Treatment: 24 Edema Assessment Assessed: [Left: No] [Right: No] Edema: [Left: Ye] [Right: s] Calf Left: Right: Point of Measurement: From Medial Instep 28 cm Ankle Left: Right: Point of Measurement: From Medial Instep 18 cm Vascular Assessment Pulses: Dorsalis Pedis Palpable: [Left:Yes] Extremity colors, hair growth, and conditions: Extremity Color: [Left:Hyperpigmented] Hair Growth on Extremity: [Left:No] Temperature of Extremity: [Left:Warm < 3 seconds] Electronic Signature(s) Signed: 05/19/2023 4:40:40 PM By: Zenaida Deed RN, BSN Entered By: Zenaida Deed on 05/19/2023 10:54:32 -------------------------------------------------------------------------------- Multi Wound Chart Details Patient Name: Date of Service: [name], [name] 05/19/2023 2:00  PM Medical Record Number: [id number] Patient Account Number: [id number] Date of Birth/Sex: Treating RN: Dec 22, 1940 (82 y.o. F) Primary Care Blaiden Werth: Chilton Greathouse R Other Clinician: Referring Makana Rostad: Treating Tyress Loden/Extender: [name], [name] in Treatment: 24 Vital Signs Height(in): 60 Pulse(bpm): 63 Weight(lbs): 138 Blood Pressure(mmHg): 190/78 Body Mass Index(BMI): 26.9 Temperature(F): 98.1 Respiratory Rate(breaths/min): 18 [1:Photos:] [N/A:N/A] Left, Medial Lower Leg N/A N/A Wound Location: Gradually Appeared N/A N/A Wounding Event: Venous Leg Ulcer N/A N/A Primary Etiology: Cataracts, Arrhythmia, Hypertension, N/A N/A Comorbid History: Peripheral Venous Disease, Gout, Osteoarthritis 08/29/2022 N/A N/A Date Acquired:  24 N/A N/A Weeks of Treatment: Open N/A N/A Wound Status: No N/A N/A Wound Recurrence: 0.9x0.8x0.1 N/A N/A Measurements L x W x D (cm) 0.565 N/A N/A A (cm) : rea 0.057 N/A N/A Volume (cm) : 90.40% N/A N/A % Reduction in A rea: 90.30% N/A N/A % Reduction in Volume: Full Thickness Without Exposed N/A N/A Classification: Support Structures Medium N/A N/A Exudate A mount: Serosanguineous N/A N/A Exudate Type: red, brown N/A N/A Exudate Color: Flat and Intact N/A N/A Wound Margin: Medium (34-66%) N/A N/A Granulation A mount: Pink N/A N/A Granulation Quality: Medium (34-66%) N/A N/A Necrotic A mount: Fat Layer (Subcutaneous Tissue): Yes N/A N/A Exposed Structures: Fascia: No Tendon: No Muscle: No Joint: No Bone: No Small (1-33%) N/A N/A Epithelialization: Debridement - Excisional N/A N/A Debridement: Pre-procedure Verification/Time Out 14:00 N/A N/A Taken: Lidocaine 4% Topical Solution N/A N/A Pain Control: Subcutaneous, Slough N/A N/A Tissue Debrided: Skin/Subcutaneous Tissue N/A N/A Level: 0.57 N/A N/A Debridement A (sq cm): rea Curette N/A N/A Instrument: Minimum N/A  N/A Bleeding: Pressure N/A N/A Hemostasis A chieved: 2 N/A N/A Procedural Pain: 1 N/A N/A Post Procedural Pain: Procedure was tolerated well N/A N/A Debridement Treatment Response: 0.9x0.8x0.1 N/A N/A Post Debridement Measurements L x W x D (cm) 0.057 N/A N/A Post Debridement Volume: (cm) No Abnormalities Noted N/A N/A Periwound Skin Texture: No Abnormalities Noted N/A N/A Periwound Skin Moisture: No Abnormalities Noted N/A N/A Periwound Skin Color: No Abnormality N/A N/A Temperature: Debridement N/A N/A Procedures Performed: Treatment Notes Electronic Signature(s) Signed: 05/19/2023 2:08:03 PM By: Duanne Guess MD FACS Entered By: Duanne Guess on 05/19/2023 11:08:03 -------------------------------------------------------------------------------- Multi-Disciplinary Care Plan Details Patient Name: Date of Service: [name], [name] 05/19/2023 2:00 PM Medical Record Number: [id number] Patient Account Number: [id number] Date of Birth/Sex: Treating RN: 24-May-1941 (82 y.o. [name] Primary Care Letizia Hook: [name] Other Clinician: Referring Lavella Myren: Treating Kodee Ravert/Extender: [name], Frederick Peers in Treatment: [address] [place] N ([id number]) [id number] Page 4 of 7 Multidisciplinary Care Plan reviewed with physician Active Inactive Necrotic Tissue Nursing Diagnoses: Impaired tissue integrity related to necrotic/devitalized tissue Knowledge deficit related to management of necrotic/devitalized tissue Goals: Necrotic/devitalized tissue will be minimized in the wound bed Date Initiated: 11/26/2022 Target Resolution Date: 06/05/2023 Goal Status: Active Patient/caregiver will verbalize understanding of reason and process for debridement of necrotic tissue Date Initiated: 11/26/2022 Date Inactivated: 01/20/2023 Target Resolution Date: 03/06/2023 Goal Status: Met Interventions: Assess patient  pain level pre-, during and post procedure and prior to discharge Provide education on necrotic tissue and debridement process Treatment Activities: Apply topical anesthetic as ordered : 11/26/2022 Notes: Wound/Skin Impairment Nursing Diagnoses: Impaired tissue integrity Knowledge deficit related to ulceration/compromised skin integrity Goals: Patient/caregiver will verbalize understanding of skin care regimen Date Initiated: 11/26/2022 Target Resolution Date: 06/05/2023 Goal Status: Active Interventions: Assess ulceration(s) every visit Treatment Activities: Skin care regimen initiated : 11/26/2022 Topical wound management initiated : 11/26/2022 Notes: Electronic Signature(s) Signed: 05/19/2023 4:40:40 PM By: Zenaida Deed RN, BSN Entered By: Zenaida Deed on 05/19/2023 10:35:00 -------------------------------------------------------------------------------- Pain Assessment Details Patient Name: Date of Service: [name], [name] 05/19/2023 2:00 PM Medical Record Number: [id number] Patient Account Number: [id number] Date of Birth/Sex: Treating RN: [birth year] (82 y.o. [name] Primary Care Iori Gigante: [name] Other Clinician: Referring Kaelum Kissick: Treating Khilynn Borntreger/Extender: [name] Weeks in Treatment: 24 Active Problems Location of Pain Severity and Description of Pain Patient Has Paino No Site Locations Rate  the pain. Erin [name], Erin [name] ([id number]) [id number] Page 5 of 7 Rate the pain. Current Pain Level: 0 Pain Management and Medication Current Pain Management: Electronic Signature(s) Signed: 05/19/2023 4:40:40 PM By: Zenaida Deed RN, BSN Entered By: Zenaida Deed on 05/19/2023 10:50:14 -------------------------------------------------------------------------------- Patient/Caregiver Education Details Patient Name: Date of Service: [name], [name] 11/12/2024andnbsp2:00  PM Medical Record Number: [id number] Patient Account Number: [id number] Date of Birth/Gender: Treating RN: [birth year] (82 y.o. [name] Primary Care Physician: [name] Other Clinician: Referring Physician: Treating Physician/Extender: [name] in Treatment: 5 Education Assessment Education Provided To: Patient Education Topics Provided Venous: Methods: Explain/Verbal Responses: Reinforcements needed, State content correctly Wound/Skin Impairment: Methods: Explain/Verbal Responses: Reinforcements needed, State content correctly Electronic Signature(s) Signed: 05/19/2023 4:40:40 PM By: Zenaida Deed RN, BSN Entered By: Zenaida Deed on 05/19/2023 10:35:19 -------------------------------------------------------------------------------- Wound Assessment Details Patient Name: Date of Service: [name], [name] 05/19/2023 2:00 PM Erin [name], Erin [name] ([id number]) [id number] Page 6 of 7 Medical Record Number: [id number] Patient Account Number: [id number] Date of Birth/Sex: Treating RN: 02-14-1941 (82 y.o. [name] Primary Care Adar Rase: [name] Other Clinician: Referring Raylan Troiani: Treating Ronal Maybury/Extender: [name], [name] in Treatment: 24 Wound Status Wound Number: 1 Primary Venous Leg Ulcer Etiology: Wound Location: Left, Medial Lower Leg Wound Open Wounding Event: Gradually Appeared Status: Date Acquired: 08/29/2022 Comorbid Cataracts, Arrhythmia, Hypertension, Peripheral Venous Weeks Of Treatment: 24 History: Disease, Gout, Osteoarthritis Clustered Wound: No Photos Wound Measurements Length: (cm) 0.9 Width: (cm) 0.8 Depth: (cm) 0.1 Area: (cm) 0.565 Volume: (cm) 0.057 % Reduction in Area: 90.4% % Reduction in Volume: 90.3% Epithelialization: Small (1-33%) Tunneling: No Undermining: No Wound Description Classification: Full Thickness  Without Exposed Support Structures Wound Margin: Flat and Intact Exudate Amount: Medium Exudate Type: Serosanguineous Exudate Color: red, brown Foul Odor After Cleansing: No Slough/Fibrino Yes Wound Bed Granulation Amount: Medium (34-66%) Exposed Structure Granulation Quality: Pink Fascia Exposed: No Necrotic Amount: Medium (34-66%) Fat Layer (Subcutaneous Tissue) Exposed: Yes Necrotic Quality: Adherent Slough Tendon Exposed: No Muscle Exposed: No Joint Exposed: No Bone Exposed: No Periwound Skin Texture Texture Color No Abnormalities Noted: Yes No Abnormalities Noted: Yes Moisture Temperature / Pain No Abnormalities Noted: Yes Temperature: No Abnormality Treatment Notes Wound #1 (Lower Leg) Wound Laterality: Left, Medial Cleanser Soap and Water Discharge Instruction: May shower and wash wound with dial antibacterial soap and water prior to dressing change. Wound Cleanser Discharge Instruction: Cleanse the wound with wound cleanser prior to applying a clean dressing using gauze sponges, not tissue or cotton balls. Peri-Wound Care Triamcinolone 15 (g) Discharge Instruction: Use triamcinolone 15 (g) as directed Sween Lotion (Moisturizing lotion) Discharge Instruction: Apply moisturizing lotion as directed Erin [name], Erin [name] ([id number]) [id number] Page 7 of 7 Topical Gentamicin Discharge Instruction: As directed by physician Mupirocin Ointment Discharge Instruction: Apply Mupirocin (Bactroban) as instructed Skintegrity Hydrogel 4 (oz) Discharge Instruction: Apply hydrogel as directed Primary Dressing Endoform 2x2 in Discharge Instruction: Moisten with saline Secondary Dressing Optifoam Non-Adhesive Dressing, 4x4 in Discharge Instruction: Apply over primary dressing as directed. Secured With Compression Wrap Kerlix Roll 4.5x3.1 (in/yd) Discharge Instruction: Apply Kerlix and Coban compression as directed. Coban Self-Adherent Wrap 4x5  (in/yd) Discharge Instruction: Apply over Kerlix as directed. Compression Stockings Add-Ons Electronic Signature(s) Signed: 05/19/2023 4:40:40 PM By: Zenaida Deed RN, BSN Entered By: Zenaida Deed on 05/19/2023 10:58:49 -------------------------------------------------------------------------------- Vitals Details Patient Name: Date of Service: [name], [name] 05/19/2023 2:00 PM Medical  Record Number: [id number] Patient Account Number: [id number] Date of Birth/Sex: Treating RN: 02/02/1941 (82 y.o. [name] Primary Care Artasia Thang: [name] Other Clinician: Referring Kafi Dotter: Treating Dollye Glasser/Extender: [name], [name] in Treatment: 24 Vital Signs Time Taken: 13:49 Temperature (F): 98.1 Height (in): 60 Pulse (bpm): 63 Weight (lbs): 138 Respiratory Rate (breaths/min): 18 Body Mass Index (BMI): 26.9 Blood Pressure (mmHg): 190/78 Reference Range: 80 - 120 mg / dl Electronic Signature(s) Signed: 05/19/2023 4:40:40 PM By: Zenaida Deed RN, BSN Entered By: Zenaida Deed on 05/19/2023 10:50:05

## 2023-05-26 ENCOUNTER — Encounter (HOSPITAL_BASED_OUTPATIENT_CLINIC_OR_DEPARTMENT_OTHER): Payer: Medicare HMO | Admitting: General Surgery

## 2023-05-26 DIAGNOSIS — I87332 Chronic venous hypertension (idiopathic) with ulcer and inflammation of left lower extremity: Secondary | ICD-10-CM | POA: Diagnosis not present

## 2023-05-26 DIAGNOSIS — L97822 Non-pressure chronic ulcer of other part of left lower leg with fat layer exposed: Secondary | ICD-10-CM | POA: Diagnosis not present

## 2023-05-26 DIAGNOSIS — E039 Hypothyroidism, unspecified: Secondary | ICD-10-CM | POA: Diagnosis not present

## 2023-05-26 DIAGNOSIS — N183 Chronic kidney disease, stage 3 unspecified: Secondary | ICD-10-CM | POA: Diagnosis not present

## 2023-05-26 DIAGNOSIS — I129 Hypertensive chronic kidney disease with stage 1 through stage 4 chronic kidney disease, or unspecified chronic kidney disease: Secondary | ICD-10-CM | POA: Diagnosis not present

## 2023-05-26 DIAGNOSIS — M1990 Unspecified osteoarthritis, unspecified site: Secondary | ICD-10-CM | POA: Diagnosis not present

## 2023-05-26 DIAGNOSIS — I872 Venous insufficiency (chronic) (peripheral): Secondary | ICD-10-CM | POA: Diagnosis not present

## 2023-05-26 DIAGNOSIS — M109 Gout, unspecified: Secondary | ICD-10-CM | POA: Diagnosis not present

## 2023-05-26 NOTE — Progress Notes (Addendum)
Erin [name], Erin [name] ([id number]) [id number] Page 1 of 8 Visit Report for 05/26/2023 Arrival Information Details Patient Name: Date of Service: [name], [name] RBA RA N. 05/26/2023 2:00 PM Medical Record Number: [id number] Patient Account Number: [id number] Date of Birth/Sex: Treating RN: 1940/11/04 (82 y.o. F) Primary Care Steel Kerney: [name] Other Clinician: Referring Jakyiah Briones: Treating Trenna Kiely/Extender: [name] Weeks in Treatment: 25 Visit Information History Since Last Visit Added or deleted any medications: No Patient Arrived: Ambulatory Any new allergies or adverse reactions: No Arrival Time: 13:50 Had a fall or experienced change in No Accompanied By: daughter activities of daily living that may affect Transfer Assistance: None risk of falls: Patient Identification Verified: Yes Signs or symptoms of abuse/neglect since last visito No Secondary Verification Process Completed: Yes Hospitalized since last visit: No Patient Requires Transmission-Based Precautions: No Implantable device outside of the clinic excluding No Patient Has Alerts: No cellular tissue based products placed in the center since last visit: Has Dressing in Place as Prescribed: Yes Has Compression in Place as Prescribed: Yes Pain Present Now: No Electronic Signature(s) Signed: 05/26/2023 4:21:34 PM By: Zenaida Deed RN, BSN Previous Signature: 05/26/2023 2:09:32 PM Version By: [place] Scrape Entered By: Zenaida Deed on 05/26/2023 11:24:56 -------------------------------------------------------------------------------- Encounter Discharge Information Details Patient Name: Date of Service: [name], [name] 05/26/2023 2:00 PM Medical Record Number: [id number] Patient Account Number: [id number] Date of Birth/Sex: Treating RN: 11/24/1940 (82 y.o. Erin [name] Primary Care Zaccheus Edmister: [name] Other Clinician: Referring  Kue Fox: Treating Azizi Bally/Extender: [name] Weeks in Treatment: 25 Encounter Discharge Information Items Post Procedure Vitals Discharge Condition: Stable Temperature (F): 98.1 Ambulatory Status: Ambulatory Pulse (bpm): 75 Discharge Destination: Home Respiratory Rate (breaths/min): 18 Transportation: Private Auto Blood Pressure (mmHg): 191/80 Accompanied By: daughter Schedule Follow-up Appointment: Yes Clinical Summary of Care: Patient Declined Electronic Signature(s) Signed: 05/26/2023 4:21:34 PM By: Zenaida Deed RN, BSN Entered By: Zenaida Deed on 05/26/2023 11:46:10 Erin [name] ([id number]) [id number] Page 2 of 8 -------------------------------------------------------------------------------- Lower Extremity Assessment Details Patient Name: Date of Service: [name], [name] 05/26/2023 2:00 PM Medical Record Number: [id number] Patient Account Number: [id number] Date of Birth/Sex: Treating RN: 07/07/1941 (82 y.o. Erin [name] Primary Care Alida Greiner: [name] Other Clinician: Referring Brenda Cowher: Treating Enio Hornback/Extender: [name], [name] in Treatment: 25 Edema Assessment Assessed: [Left: No] [Right: No] Edema: [Left: Ye] [Right: s] Calf Left: Right: Point of Measurement: From Medial Instep 28 cm Ankle Left: Right: Point of Measurement: From Medial Instep 18 cm Vascular Assessment Pulses: Dorsalis Pedis Palpable: [Left:Yes] Extremity colors, hair growth, and conditions: Extremity Color: [Left:Hyperpigmented] Hair Growth on Extremity: [Left:No] Temperature of Extremity: [Left:Warm < 3 seconds] Electronic Signature(s) Signed: 05/26/2023 4:21:34 PM By: Zenaida Deed RN, BSN Entered By: Zenaida Deed on 05/26/2023 11:25:52 -------------------------------------------------------------------------------- Multi Wound Chart Details Patient Name: Date of  Service: [name], [name] 05/26/2023 2:00 PM Medical Record Number: [id number] Patient Account Number: [id number] Date of Birth/Sex: Treating RN: 25-Jan-1941 (82 y.o. F) Primary Care Masiyah Jorstad: [name] Other Clinician: Referring Moira Umholtz: Treating Susana Gripp/Extender: [name], [name] in Treatment: 25 Vital Signs Height(in): 60 Pulse(bpm): 75 Weight(lbs): 138 Blood Pressure(mmHg): 191/80 Body Mass Index(BMI): 26.9 Temperature(F): 98.1 Respiratory Rate(breaths/min): 18 [1:Photos:] [N/A:N/A] Left, Medial Lower Leg N/A N/A Wound Location: Gradually Appeared N/A N/A Wounding Event: Venous Leg Ulcer N/A N/A Primary Etiology: Cataracts, Arrhythmia, Hypertension, N/A N/A Comorbid History: Peripheral Venous Disease,  Gout, Osteoarthritis 08/29/2022 N/A N/A Date Acquired: 25 N/A N/A Weeks of Treatment: Open N/A N/A Wound Status: No N/A N/A Wound Recurrence: 0.6x0.6x0.1 N/A N/A Measurements L x W x D (cm) 0.283 N/A N/A A (cm) : rea 0.028 N/A N/A Volume (cm) : 95.20% N/A N/A % Reduction in A rea: 95.20% N/A N/A % Reduction in Volume: Full Thickness Without Exposed N/A N/A Classification: Support Structures Medium N/A N/A Exudate A mount: Serosanguineous N/A N/A Exudate Type: red, brown N/A N/A Exudate Color: Flat and Intact N/A N/A Wound Margin: Medium (34-66%) N/A N/A Granulation A mount: Pink N/A N/A Granulation Quality: Medium (34-66%) N/A N/A Necrotic A mount: Fat Layer (Subcutaneous Tissue): Yes N/A N/A Exposed Structures: Fascia: No Tendon: No Muscle: No Joint: No Bone: No Small (1-33%) N/A N/A Epithelialization: Debridement - Excisional N/A N/A Debridement: Pre-procedure Verification/Time Out 14:30 N/A N/A Taken: Lidocaine 4% Topical Solution N/A N/A Pain Control: Subcutaneous, Slough N/A N/A Tissue Debrided: Skin/Subcutaneous Tissue N/A N/A Level: 0.28 N/A N/A Debridement A (sq cm): rea Curette N/A  N/A Instrument: Minimum N/A N/A Bleeding: Pressure N/A N/A Hemostasis A chieved: 0 N/A N/A Procedural Pain: 0 N/A N/A Post Procedural Pain: Procedure was tolerated well N/A N/A Debridement Treatment Response: 0.6x0.6x0.1 N/A N/A Post Debridement Measurements L x W x D (cm) 0.028 N/A N/A Post Debridement Volume: (cm) No Abnormalities Noted N/A N/A Periwound Skin Texture: No Abnormalities Noted N/A N/A Periwound Skin Moisture: No Abnormalities Noted N/A N/A Periwound Skin Color: No Abnormality N/A N/A Temperature: Yes N/A N/A Tenderness on Palpation: Debridement N/A N/A Procedures Performed: Treatment Notes Wound #1 (Lower Leg) Wound Laterality: Left, Medial Cleanser Soap and Water Discharge Instruction: May shower and wash wound with dial antibacterial soap and water prior to dressing change. Wound Cleanser Discharge Instruction: Cleanse the wound with wound cleanser prior to applying a clean dressing using gauze sponges, not tissue or cotton balls. Peri-Wound Care Triamcinolone 15 (g) Discharge Instruction: Use triamcinolone 15 (g) as directed Sween Lotion (Moisturizing lotion) Discharge Instruction: Apply moisturizing lotion as directed Topical Gentamicin Discharge Instruction: As directed by physician Mupirocin Ointment Discharge Instruction: Apply Mupirocin (Bactroban) as instructed Skintegrity Hydrogel 4 (oz) Discharge Instruction: Apply hydrogel as directed Erin [name], Erin [name] ([id number]) [id number].pdf Page 4 of 8 Primary Dressing Endoform 2x2 in Discharge Instruction: Moisten with saline Secondary Dressing Optifoam Non-Adhesive Dressing, 4x4 in Discharge Instruction: Apply over primary dressing as directed. Secured With Compression Wrap Kerlix Roll 4.5x3.1 (in/yd) Discharge Instruction: Apply Kerlix and Coban compression as directed. Coban Self-Adherent Wrap 4x5 (in/yd) Discharge Instruction: Apply over Kerlix as  directed. Compression Stockings Add-Ons Electronic Signature(s) Signed: 05/26/2023 2:49:12 PM By: Duanne Guess MD FACS Entered By: Duanne Guess on 05/26/2023 11:49:12 -------------------------------------------------------------------------------- Multi-Disciplinary Care Plan Details Patient Name: Date of Service: [name], [name] 05/26/2023 2:00 PM Medical Record Number: [id number] Patient Account Number: [id number] Date of Birth/Sex: Treating RN: [birth year] (82 y.o. Erin [name] Primary Care Tapanga [place]ta[place]: [name] Other Clinician: Referring Rashied Corallo: Treating Homer Pfeifer/Extender: [name] in Treatment: 25 Multidisciplinary Care Plan reviewed with physician Active Inactive Necrotic Tissue Nursing Diagnoses: Impaired tissue integrity related to necrotic/devitalized tissue Knowledge deficit related to management of necrotic/devitalized tissue Goals: Necrotic/devitalized tissue will be minimized in the wound bed Date Initiated: 11/26/2022 Target Resolution Date: 06/05/2023 Goal Status: Active Patient/caregiver will verbalize understanding of reason and process for debridement of necrotic tissue Date Initiated: 11/26/2022 Date Inactivated: 01/20/2023 Target Resolution Date: 03/06/2023 Goal Status: Met Interventions: Assess patient pain level  pre-, during and post procedure and prior to discharge Provide education on necrotic tissue and debridement process Treatment Activities: Apply topical anesthetic as ordered : 11/26/2022 Notes: Wound/Skin Impairment Nursing Diagnoses: Impaired tissue integrity Erin [name], Erin [name] ([id number]) [phone].pdf Page 5 of 8 Knowledge deficit related to ulceration/compromised skin integrity Goals: Patient/caregiver will verbalize understanding of skin care regimen Date Initiated: 11/26/2022 Target Resolution Date: 06/05/2023 Goal Status: Active Interventions: Assess  ulceration(s) every visit Treatment Activities: Skin care regimen initiated : 11/26/2022 Topical wound management initiated : 11/26/2022 Notes: Electronic Signature(s) Signed: 05/26/2023 4:21:34 PM By: Zenaida Deed RN, BSN Entered By: Zenaida Deed on 05/26/2023 11:26:55 -------------------------------------------------------------------------------- Pain Assessment Details Patient Name: Date of Service: [name], [name] 05/26/2023 2:00 PM Medical Record Number: [id number] Patient Account Number: [id number] Date of Birth/Sex: Treating RN: [birth year] (82 y.o. F) Primary Care Brandalyn Harting: [name] Other Clinician: Referring Jennie Hannay: Treating Ansh Fauble/Extender: [name], [name] in Treatment: 25 Active Problems Location of Pain Severity and Description of Pain Patient Has Paino No Site Locations Rate the pain. Current Pain Level: 0 Pain Management and Medication Current Pain Management: Electronic Signature(s) Signed: 05/26/2023 4:21:34 PM By: Zenaida Deed RN, BSN Previous Signature: 05/26/2023 2:09:32 PM Version By: [place] Scrape Entered By: Zenaida Deed on 05/26/2023 11:25:12 Erin [name] ([id number]) [id number] Page 6 of 8 -------------------------------------------------------------------------------- Patient/Caregiver Education Details Patient Name: Date of Service: [name], [name] 11/19/2024andnbsp2:00 PM Medical Record Number: [id number] Patient Account Number: [id number] Date of Birth/Gender: Treating RN: 1941-02-13 (82 y.o. Erin [name] Primary Care Physician: [name] Other Clinician: Referring Physician: Treating Physician/Extender: [name] in Treatment: 25 Education Assessment Education Provided To: Patient Education Topics Provided Venous: Methods: Explain/Verbal Responses: Reinforcements needed, State content  correctly Wound/Skin Impairment: Methods: Explain/Verbal Responses: Reinforcements needed, State content correctly Electronic Signature(s) Signed: 05/26/2023 4:21:34 PM By: Zenaida Deed RN, BSN Entered By: Zenaida Deed on 05/26/2023 11:27:19 -------------------------------------------------------------------------------- Wound Assessment Details Patient Name: Date of Service: [name], [name] 05/26/2023 2:00 PM Medical Record Number: [id number] Patient Account Number: [id number] Date of Birth/Sex: Treating RN: [birth year] (82 y.o. F) Primary Care Soraiya Ahner: [name] Other Clinician: Referring Levia Waltermire: Treating Rodney Yera/Extender: [name], [name] in Treatment: 25 Wound Status Wound Number: 1 Primary Venous Leg Ulcer Etiology: Wound Location: Left, Medial Lower Leg Wound Open Wounding Event: Gradually Appeared Status: Date Acquired: 08/29/2022 Comorbid Cataracts, Arrhythmia, Hypertension, Peripheral Venous Weeks Of Treatment: 25 History: Disease, Gout, Osteoarthritis Clustered Wound: No Photos Erin [name], Erin [name] ([id number]) [id number] Page 7 of 8 Wound Measurements Length: (cm) 0.6 Width: (cm) 0.6 Depth: (cm) 0.1 Area: (cm) 0.283 Volume: (cm) 0.028 % Reduction in Area: 95.2% % Reduction in Volume: 95.2% Epithelialization: Small (1-33%) Tunneling: No Undermining: No Wound Description Classification: Full Thickness Without Exposed Support Structures Wound Margin: Flat and Intact Exudate Amount: Medium Exudate Type: Serosanguineous Exudate Color: red, brown Foul Odor After Cleansing: No Slough/Fibrino Yes Wound Bed Granulation Amount: Medium (34-66%) Exposed Structure Granulation Quality: Pink Fascia Exposed: No Necrotic Amount: Medium (34-66%) Fat Layer (Subcutaneous Tissue) Exposed: Yes Necrotic Quality: Adherent Slough Tendon Exposed: No Muscle Exposed: No Joint Exposed: No Bone Exposed:  No Periwound Skin Texture Texture Color No Abnormalities Noted: Yes No Abnormalities Noted: Yes Moisture Temperature / Pain No Abnormalities Noted: Yes Temperature: No Abnormality Tenderness on Palpation: Yes Treatment Notes Wound #1 (Lower Leg) Wound Laterality: Left, Medial Cleanser Soap and Water Discharge Instruction: May shower and wash  wound with dial antibacterial soap and water prior to dressing change. Wound Cleanser Discharge Instruction: Cleanse the wound with wound cleanser prior to applying a clean dressing using gauze sponges, not tissue or cotton balls. Peri-Wound Care Triamcinolone 15 (g) Discharge Instruction: Use triamcinolone 15 (g) as directed Sween Lotion (Moisturizing lotion) Discharge Instruction: Apply moisturizing lotion as directed Topical Gentamicin Discharge Instruction: As directed by physician Mupirocin Ointment Discharge Instruction: Apply Mupirocin (Bactroban) as instructed Skintegrity Hydrogel 4 (oz) Discharge Instruction: Apply hydrogel as directed Primary Dressing Endoform 2x2 in Discharge Instruction: Moisten with saline Secondary Dressing Optifoam Non-Adhesive Dressing, 4x4 in Discharge Instruction: Apply over primary dressing as directed. Secured With Compression Wrap Kerlix Roll 4.5x3.1 (in/yd) Discharge Instruction: Apply Kerlix and Coban compression as directed. Coban Self-Adherent Wrap 4x5 (in/yd) Discharge Instruction: Apply over Kerlix as directed. Compression Stockings Erin [name], Erin [name] ([id number]) [id number] Page 8 of 8 Add-Ons Electronic Signature(s) Signed: 05/26/2023 4:21:34 PM By: Zenaida Deed RN, BSN Previous Signature: 05/26/2023 2:09:32 PM Version By: [place] Scrape Entered By: Zenaida Deed on 05/26/2023 11:26:25 -------------------------------------------------------------------------------- Vitals Details Patient Name: Date of Service: [name], [name] 05/26/2023 2:00  PM Medical Record Number: [id number] Patient Account Number: [id number] Date of Birth/Sex: Treating RN: [birth year] (82 y.o. F) Primary Care Laritza Vokes: [name] Other Clinician: Referring Miral Hoopes: Treating Ihsan Nomura/Extender: [name], [name] in Treatment: 25 Vital Signs Time Taken: 01:50 Temperature (F): 98.1 Height (in): 60 Pulse (bpm): 75 Weight (lbs): 138 Respiratory Rate (breaths/min): 18 Body Mass Index (BMI): 26.9 Blood Pressure (mmHg): 191/80 Reference Range: 80 - 120 mg / dl Electronic Signature(s) Signed: 05/26/2023 4:21:34 PM By: Zenaida Deed RN, BSN Previous Signature: 05/26/2023 2:09:32 PM Version By: [place] Scrape Entered By: Zenaida Deed on 05/26/2023 11:25:01

## 2023-05-26 NOTE — Progress Notes (Signed)
Erin [name], Erin [name] ([id number]) [id number] Page 1 of 10 Visit Report for 05/26/2023 Chief Complaint Document Details Patient Name: Date of Service: [name], [name] RBA RA N. 05/26/2023 2:00 PM Medical Record Number: [id number] Patient Account Number: [id number] Date of Birth/Sex: Treating RN: March 13, 1941 (82 y.o. F) Primary Care Provider: [name] Other Clinician: Referring Provider: Treating Provider/Extender: [name] Weeks in Treatment: 25 Information Obtained from: Patient Chief Complaint Patient presents for treatment of an open ulcer due to venous insufficiency Electronic Signature(s) Signed: 05/26/2023 2:49:19 PM By: Duanne Guess MD FACS Entered By: Duanne Guess on 05/26/2023 11:49:19 -------------------------------------------------------------------------------- Debridement Details Patient Name: Date of Service: [name], [name] 05/26/2023 2:00 PM Medical Record Number: [id number] Patient Account Number: [id number] Date of Birth/Sex: Treating RN: Dec 31, 1940 (82 y.o. [name] Primary Care Provider: [name] Other Clinician: Referring Provider: Treating Provider/Extender: [name] Weeks in Treatment: 25 Debridement Performed for Assessment: Wound #1 Left,Medial Lower Leg Performed By: Physician Duanne Guess, MD The following information was scribed by: Zenaida Deed The information was scribed for: Duanne Guess Debridement Type: Debridement Severity of Tissue Pre Debridement: Fat layer exposed Level of Consciousness (Pre-procedure): Awake and Alert Pre-procedure Verification/Time Out Yes - 14:30 Taken: Start Time: 14:31 Pain Control: Lidocaine 4% T opical Solution Percent of Wound Bed Debrided: 100% T Area Debrided (cm): otal 0.28 Tissue and other material debrided: Non-Viable, Slough, Subcutaneous, Slough Level: Skin/Subcutaneous  Tissue Debridement Description: Excisional Instrument: Curette Bleeding: Minimum Hemostasis Achieved: Pressure Procedural Pain: 0 Post Procedural Pain: 0 Response to Treatment: Procedure was tolerated well Level of Consciousness (Post- Awake and Alert procedure): Post Debridement Measurements of Total Wound Length: (cm) 0.6 Width: (cm) 0.6 Depth: (cm) 0.1 Volume: (cm) 0.028 Erin [name], Erin [name] ([id number]) [id number] Page 2 of 10 Character of Wound/Ulcer Post Debridement: Improved Severity of Tissue Post Debridement: Fat layer exposed Post Procedure Diagnosis Same as Pre-procedure Electronic Signature(s) Signed: 05/26/2023 3:47:16 PM By: Duanne Guess MD FACS Signed: 05/26/2023 4:21:34 PM By: Zenaida Deed RN, BSN Entered By: Zenaida Deed on 05/26/2023 11:34:02 -------------------------------------------------------------------------------- HPI Details Patient Name: Date of Service: [name], [name] 05/26/2023 2:00 PM Medical Record Number: [id number] Patient Account Number: [id number] Date of Birth/Sex: Treating RN: 1941-01-23 (82 y.o. F) Primary Care Provider: [name] Other Clinician: Referring Provider: Treating Provider/Extender: [name] Weeks in Treatment: 25 History of Present Illness HPI Description: ADMISSION 11/26/2022 This is an 82 year old woman who was referred by her primary care provider for a persistent nonhealing wound on her left lower extremity. It has been present since February of this year. 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 I do not see any formal venous reflux studies in the electronic medical record. She is not diabetic. She does not smoke. Her PCP has been washing her leg each week and applying a Kerlix and Coban wrap, but has not performed any formal debridement and has not  been using any sort of topical contact layer or ointment. ABI in clinic was noncompressible, but she has a palpable pedal pulse. 12/10/2022: The wound is a bit smaller and cleaner today. There is still fairly heavy slough on the surface. Edema control is acceptable. 12/16/2022: The wound is measuring the slightest bit smaller today. There is still thick slough on the surface. Edema control is good. 12/23/2022: The wound is a little bit  smaller again today. Still with fairly heavy slough accumulation. Edema control is good. She has a new small wound on the proximal portion of the same leg. It looks as though she may have developed a small blister over the weekend subsequently ruptured. There is minimal slough on the surface. 12/30/2022: The wound is about the same size, but substantially cleaner. There is some hypertrophic granulation tissue starting to emerge. The wound that was new last week has closed but she has a different new wound today on the lateral aspect of her left lower leg. It also appears to have been a blister that opened and ruptured. There is a little bit of slough on the surface. 01/06/2023: She has new wounds today. She has a wound on the PIP knuckle of her left third and fourth toes, as well as an anterior tibial wound. She says that the wrap was too tight and it also looks like when she pushes her foot into her shoe, it shifts the wrapping back to her midfoot causing bunching up of the wrap material and pain. The lateral leg wounds are both smaller with a little slough and eschar present. The original medial leg wound is smaller and more superficial. There is slough on the surface. 01/13/2023: She has new wounds today. The anterior tibial wound has a satellite lesion that has opened up adjacent to it. Has additional wounds on her fifth toe in addition to the existing ones on her third and fourth toe. The lateral leg wounds seem to have healed. The original medial leg wound is also smaller,  but she and her daughter have several questions regarding why we are not making forward progress, all of which are very reasonable. 01/20/2023: The wounds on her toes have healed. There is a tiny wound on the dorsum of her foot with a little bit of slough on it. The anterolateral leg wound has epithelialized quite substantially and the satellite that had opened last week is now closed. The medial leg wound measured narrower today. It has hypertrophic granulation tissue and slough present. 01/27/2023: The small wounds have all healed. The medial leg wound is smaller as well, with some slough on the surface but without reaccumulation of the hypertrophic granulation tissue. 02/03/2023: The only remaining open wound is the left medial leg wound. It is smaller and a bit more superficial. There is still slough accumulation on the surface. No hypertrophic granulation tissue. She had both her venous reflux and lower extremity arterial studies, both of which were essentially normal, although her TBI's are slightly diminished. 02/10/2023: The left medial leg wound has some granulation tissue filling in, but overall, it is still fairly fibrotic. There is some slough on the surface today. 02/18/2023: The moisture balance and surface consistency have both improved. There is some slough over granulation tissue. Edema control is good. 02/24/2023: The wound measured smaller today. Moisture balance is good. Slough has reaccumulated. Edema remains well-controlled. 03/03/2023: The wound is a little bit smaller today. Moisture balance is stable. Minimal slough accumulation. There is more granulation tissue filling in over the fibrotic surface. Edema is well-controlled. 03/10/2023: The wound is smaller again today. Moisture balance is good. Light slough accumulation on the fibrotic surface. Edema is well-controlled. 03/17/2023: The wound is slightly smaller again today. There is minimal slough accumulation but the underlying  surface remains quite fibrotic. Edema control is good. Erin [name], Erin [name] ([id number]) [id number] Page 3 of 10 03/24/2023: Although the wound measurements are the same today, visually it looks smaller.  There is still a fairly fibrotic surface underneath and slough. Edema remains well-controlled. 03/31/2023: She has a new wound that was identified when her wraps and dressings were removed today. It is just to the midline of the anterior tibial surface. The fat layer is exposed. There is a little bit of eschar and dry skin along with some thin slough. The wound closer to her ankle measured smaller today. The surface continues to improve although portions of it still remain fibrotic. There is slough buildup present. 04/07/2023: The wound that was new last week has nearly closed, but remains open by perhaps about a millimeter. The wound closer to her ankle had no significant change based on measurements, but visually it looks smaller to me. It is a little bit dry today and the surface is still fibrotic. 04/14/2023: The more proximal wound has completely healed. The wound at her ankle is quite a bit smaller with substantial epithelialization. There is just a small open portion of the distal aspect. It is covered with slough and eschar. 04/21/2023: The ankle wound is just the slightest bit smaller but it is shallower. There is slough and eschar accumulation. She has a new wound on her left posteromedial calf. It looks as though there was some friction or pinching, potentially from the Ace bandage. There is a bit of slough on the surface as well as some old blood. 04/28/2023: The ankle wound did not change in size this week. There is a little slough on the surface. The wound on her left posteromedial calf is covered with a layer of eschar. Underneath, it is smaller and clean. Edema control is good. 05/05/2023: The ankle wound has contracted quite a bit this week. It is extremely  superficial with just a little thin eschar around the edges. The left posteromedial calf wound is healed. She had a small pustule just distal to this that was marked as a new wound, but I am not sure that the epidermis is actually violated here. 05/12/2023: The tiny calf wound that was new last week has healed. The ankle wound is smaller again today and very superficial, but has not yet completely closed. Edema control is good. 05/19/2023: Unfortunately, the ankle wound enlarged over the past week. No malodor or purulent drainage to suggest infection as the etiology and her edema control remains good. 05/26/2023: The ankle wound has contracted since her last visit. The surface has a bit of slough on it. Edema control is good. Electronic Signature(s) Signed: 05/26/2023 2:49:50 PM By: Duanne Guess MD FACS Entered By: Duanne Guess on 05/26/2023 11:49:50 -------------------------------------------------------------------------------- Physical Exam Details Patient Name: Date of Service: [name], [name] 05/26/2023 2:00 PM Medical Record Number: [id number] Patient Account Number: [id number] Date of Birth/Sex: Treating RN: 1941/01/11 (82 y.o. F) Primary Care Provider: [name] Other Clinician: Referring Provider: Treating Provider/Extender: [name], [name] in Treatment: 25 Constitutional Hypertensive, asymptomatic. . . . no acute distress. Respiratory Normal work of breathing on room air.. Notes 05/26/2023: The ankle wound has contracted since her last visit. The surface has a bit of slough on it. Edema control is good. Electronic Signature(s) Signed: 05/26/2023 2:50:23 PM By: Duanne Guess MD FACS Entered By: Duanne Guess on 05/26/2023 11:50:23 -------------------------------------------------------------------------------- Physician Orders Details Patient Name: Date of Service: [name], [name] 05/26/2023 2:00 PM Medical  Record Number: [id number] Patient Account Number: [id number] Erin [name], Erin [name] ([id number]) [phone].pdf Page 4 of 10 Date of Birth/Sex: Treating RN: [birth year] (  82 y.o. [name], [name] Primary Care Provider: [name] Other Clinician: Referring Provider: Treating Provider/Extender: [name] Weeks in Treatment: 25 The following information was scribed by: Zenaida Deed The information was scribed for: Duanne Guess Verbal / Phone Orders: No Diagnosis Coding ICD-10 Coding Code Description [phone] Non-pressure chronic ulcer of other part of left lower leg with fat layer exposed I87.332 Chronic venous hypertension (idiopathic) with ulcer and inflammation of left lower extremity I10 Essential (primary) hypertension Follow-up Appointments ppointment in 1 week. - Dr. Lady Gary - room 1 Return A Tuesday 11/26 @ 2:00 pm Anesthetic (In clinic) Topical Lidocaine 4% applied to wound bed Bathing/ Shower/ Hygiene May shower with protection but do not get wound dressing(s) wet. Protect dressing(s) with water repellant cover (for example, large plastic bag) or a cast cover and may then take shower. Edema Control - Orders / Instructions Left Lower Extremity Elevate legs to the level of the heart or above for 30 minutes daily and/or when sitting for 3-4 times a day throughout the day. Avoid standing for long periods of time. Exercise regularly Wound Treatment Wound #1 - Lower Leg Wound Laterality: Left, Medial Cleanser: Soap and Water 1 x Per Week/30 Days Discharge Instructions: May shower and wash wound with dial antibacterial soap and water prior to dressing change. Cleanser: Wound Cleanser 1 x Per Week/30 Days Discharge Instructions: Cleanse the wound with wound cleanser prior to applying a clean dressing using gauze sponges, not tissue or cotton balls. Peri-Wound Care: Triamcinolone 15 (g) 1 x Per Week/30 Days Discharge  Instructions: Use triamcinolone 15 (g) as directed Peri-Wound Care: Sween Lotion (Moisturizing lotion) 1 x Per Week/30 Days Discharge Instructions: Apply moisturizing lotion as directed Topical: Gentamicin 1 x Per Week/30 Days Discharge Instructions: As directed by physician Topical: Mupirocin Ointment 1 x Per Week/30 Days Discharge Instructions: Apply Mupirocin (Bactroban) as instructed Topical: Skintegrity Hydrogel 4 (oz) 1 x Per Week/30 Days Discharge Instructions: Apply hydrogel as directed Prim Dressing: Endoform 2x2 in 1 x Per Week/30 Days ary Discharge Instructions: Moisten with saline Secondary Dressing: Optifoam Non-Adhesive Dressing, 4x4 in 1 x Per Week/30 Days Discharge Instructions: Apply over primary dressing as directed. Compression Wrap: Kerlix Roll 4.5x3.1 (in/yd) 1 x Per Week/30 Days Discharge Instructions: Apply Kerlix and Coban compression as directed. Compression Wrap: Coban Self-Adherent Wrap 4x5 (in/yd) 1 x Per Week/30 Days Discharge Instructions: Apply over Kerlix as directed. Electronic Signature(s) Signed: 05/26/2023 3:47:16 PM By: Duanne Guess MD FACS Entered By: Duanne Guess on 05/26/2023 11:50:56 Erin [name] ([id number]) [id number] Page 5 of 10 -------------------------------------------------------------------------------- Problem List Details Patient Name: Date of Service: [name], [name] 05/26/2023 2:00 PM Medical Record Number: [id number] Patient Account Number: [id number] Date of Birth/Sex: Treating RN: 1940-11-30 (82 y.o. [name] Primary Care Provider: [name] Other Clinician: Referring Provider: Treating Provider/Extender: [name] Weeks in Treatment: 25 Active Problems ICD-10 Encounter Code Description Active Date MDM Diagnosis [id number] Non-pressure chronic ulcer of other part of left lower leg with fat layer exposed5/22/2024 No Yes I87.332 Chronic  venous hypertension (idiopathic) with ulcer and inflammation of left 11/26/2022 No Yes lower extremity I10 Essential (primary) hypertension 11/26/2022 No Yes Inactive Problems Resolved Problems ICD-10 Code Description Active Date Resolved Date L97.222 Non-pressure chronic ulcer of left calf with fat layer exposed 04/21/2023 04/21/2023 L97.522 Non-pressure chronic ulcer of other part of left foot with fat layer exposed 01/13/2023 01/13/2023 Electronic Signature(s) Signed: 05/26/2023 2:48:28 PM By: Duanne Guess MD  FACS Entered By: Duanne Guess on 05/26/2023 11:48:28 -------------------------------------------------------------------------------- Progress Note Details Patient Name: Date of Service: [name], [name] 05/26/2023 2:00 PM Medical Record Number: [id number] Patient Account Number: [id number] Date of Birth/Sex: Treating RN: July 21, 1940 (82 y.o. F) Primary Care Provider: [name] Other Clinician: Referring Provider: Treating Provider/Extender: [name] Weeks in Treatment: 25 Subjective Chief Complaint Information obtained from Patient Patient presents for treatment of an open ulcer due to venous insufficiency KEYAWNA, LENNEN ([id number]) [phone].pdf Page 6 of 10 History of Present Illness (HPI) ADMISSION 11/26/2022 This is an 82 year old woman who was referred by her primary care provider for a persistent nonhealing wound on her left lower extremity. It has been present since February of this year. 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 I do not see any formal venous reflux studies in the electronic medical record. She is not diabetic. She does not smoke. Her PCP has been washing her leg each week and applying a Kerlix and Coban wrap, but has not performed any formal debridement and has not been using any sort of  topical contact layer or ointment. ABI in clinic was noncompressible, but she has a palpable pedal pulse. 12/10/2022: The wound is a bit smaller and cleaner today. There is still fairly heavy slough on the surface. Edema control is acceptable. 12/16/2022: The wound is measuring the slightest bit smaller today. There is still thick slough on the surface. Edema control is good. 12/23/2022: The wound is a little bit smaller again today. Still with fairly heavy slough accumulation. Edema control is good. She has a new small wound on the proximal portion of the same leg. It looks as though she may have developed a small blister over the weekend subsequently ruptured. There is minimal slough on the surface. 12/30/2022: The wound is about the same size, but substantially cleaner. There is some hypertrophic granulation tissue starting to emerge. The wound that was new last week has closed but she has a different new wound today on the lateral aspect of her left lower leg. It also appears to have been a blister that opened and ruptured. There is a little bit of slough on the surface. 01/06/2023: She has new wounds today. She has a wound on the PIP knuckle of her left third and fourth toes, as well as an anterior tibial wound. She says that the wrap was too tight and it also looks like when she pushes her foot into her shoe, it shifts the wrapping back to her midfoot causing bunching up of the wrap material and pain. The lateral leg wounds are both smaller with a little slough and eschar present. The original medial leg wound is smaller and more superficial. There is slough on the surface. 01/13/2023: She has new wounds today. The anterior tibial wound has a satellite lesion that has opened up adjacent to it. Has additional wounds on her fifth toe in addition to the existing ones on her third and fourth toe. The lateral leg wounds seem to have healed. The original medial leg wound is also smaller, but she and her  daughter have several questions regarding why we are not making forward progress, all of which are very reasonable. 01/20/2023: The wounds on her toes have healed. There is a tiny wound on the dorsum of her foot with a little bit of slough on it. The anterolateral leg wound  has epithelialized quite substantially and the satellite that had opened last week is now closed. The medial leg wound measured narrower today. It has hypertrophic granulation tissue and slough present. 01/27/2023: The small wounds have all healed. The medial leg wound is smaller as well, with some slough on the surface but without reaccumulation of the hypertrophic granulation tissue. 02/03/2023: The only remaining open wound is the left medial leg wound. It is smaller and a bit more superficial. There is still slough accumulation on the surface. No hypertrophic granulation tissue. She had both her venous reflux and lower extremity arterial studies, both of which were essentially normal, although her TBI's are slightly diminished. 02/10/2023: The left medial leg wound has some granulation tissue filling in, but overall, it is still fairly fibrotic. There is some slough on the surface today. 02/18/2023: The moisture balance and surface consistency have both improved. There is some slough over granulation tissue. Edema control is good. 02/24/2023: The wound measured smaller today. Moisture balance is good. Slough has reaccumulated. Edema remains well-controlled. 03/03/2023: The wound is a little bit smaller today. Moisture balance is stable. Minimal slough accumulation. There is more granulation tissue filling in over the fibrotic surface. Edema is well-controlled. 03/10/2023: The wound is smaller again today. Moisture balance is good. Light slough accumulation on the fibrotic surface. Edema is well-controlled. 03/17/2023: The wound is slightly smaller again today. There is minimal slough accumulation but the underlying surface remains quite  fibrotic. Edema control is good. 03/24/2023: Although the wound measurements are the same today, visually it looks smaller. There is still a fairly fibrotic surface underneath and slough. Edema remains well-controlled. 03/31/2023: She has a new wound that was identified when her wraps and dressings were removed today. It is just to the midline of the anterior tibial surface. The fat layer is exposed. There is a little bit of eschar and dry skin along with some thin slough. The wound closer to her ankle measured smaller today. The surface continues to improve although portions of it still remain fibrotic. There is slough buildup present. 04/07/2023: The wound that was new last week has nearly closed, but remains open by perhaps about a millimeter. The wound closer to her ankle had no significant change based on measurements, but visually it looks smaller to me. It is a little bit dry today and the surface is still fibrotic. 04/14/2023: The more proximal wound has completely healed. The wound at her ankle is quite a bit smaller with substantial epithelialization. There is just a small open portion of the distal aspect. It is covered with slough and eschar. 04/21/2023: The ankle wound is just the slightest bit smaller but it is shallower. There is slough and eschar accumulation. She has a new wound on her left posteromedial calf. It looks as though there was some friction or pinching, potentially from the Ace bandage. There is a bit of slough on the surface as well as some old blood. 04/28/2023: The ankle wound did not change in size this week. There is a little slough on the surface. The wound on her left posteromedial calf is covered with a layer of eschar. Underneath, it is smaller and clean. Edema control is good. 05/05/2023: The ankle wound has contracted quite a bit this week. It is extremely superficial with just a little thin eschar around the edges. The left posteromedial calf wound is healed. She  had a small pustule just distal to this that was marked as a new wound, but I am not  sure that the epidermis is actually violated here. 05/12/2023: The tiny calf wound that was new last week has healed. The ankle wound is smaller again today and very superficial, but has not yet completely closed. Edema control is good. 05/19/2023: Unfortunately, the ankle wound enlarged over the past week. No malodor or purulent drainage to suggest infection as the etiology and her edema control remains good. 05/26/2023: The ankle wound has contracted since her last visit. The surface has a bit of slough on it. Edema control is good. Patient History Family History Cancer - Siblings, Lung Disease - Siblings, Erin [name], Erin [name] ([id number]) [id number] Page 7 of 10 No family history of Diabetes, Heart Disease, Hereditary Spherocytosis, Hypertension, Kidney Disease, Seizures, Stroke, Thyroid Problems, Tuberculosis. Social History Never smoker, Marital Status - Widowed, Alcohol Use - Never, Drug Use - No History, Caffeine Use - Moderate. Medical History Eyes Patient has history of Cataracts Cardiovascular Patient has history of Arrhythmia - bundle branch block, Hypertension, Peripheral Venous Disease Musculoskeletal Patient has history of Gout, Osteoarthritis Hospitalization/Surgery History - Salpingoophorectomy. - Colon resection. - Appendectomy. - Cystoscopy w/ retrogrades. - Joint replacement. - Mastectomy right. - gastric ulcer repair. - Abdominal hysterectomy. Medical A Surgical History Notes nd Hematologic/Lymphatic thrombocytopenia Gastrointestinal diverticulosis, GERD Endocrine hypothyroidism Genitourinary CKD stage III Musculoskeletal DJD, osteoporosis Oncologic breast cancer Objective Constitutional Hypertensive, asymptomatic. no acute distress. Vitals Time Taken: 1:50 AM, Height: 60 in, Weight: 138 lbs, BMI: 26.9, Temperature: 98.1 F, Pulse: 75 bpm, Respiratory  Rate: 18 breaths/min, Blood Pressure: 191/80 mmHg. Respiratory Normal work of breathing on room air.. General Notes: 05/26/2023: The ankle wound has contracted since her last visit. The surface has a bit of slough on it. Edema control is good. Integumentary (Hair, Skin) Wound #1 status is Open. Original cause of wound was Gradually Appeared. The date acquired was: 08/29/2022. The wound has been in treatment 25 weeks. The wound is located on the Left,Medial Lower Leg. The wound measures 0.6cm length x 0.6cm width x 0.1cm depth; 0.283cm^2 area and 0.028cm^3 volume. There is Fat Layer (Subcutaneous Tissue) exposed. There is no tunneling or undermining noted. There is a medium amount of serosanguineous drainage noted. The wound margin is flat and intact. There is medium (34-66%) pink granulation within the wound bed. There is a medium (34-66%) amount of necrotic tissue within the wound bed including Adherent Slough. The periwound skin appearance had no abnormalities noted for texture. The periwound skin appearance had no abnormalities noted for moisture. The periwound skin appearance had no abnormalities noted for color. Periwound temperature was noted as No Abnormality. The periwound has tenderness on palpation. 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 There was a Excisional Skin/Subcutaneous Tissue Debridement with a total area of 0.28 sq cm performed by Duanne Guess, MD. With the following instrument(s): Curette to remove Non-Viable tissue/material. Material removed includes Subcutaneous Tissue and Slough and after  achieving pain control using Lidocaine 4% T opical Solution. No specimens were taken. A time out was conducted at 14:30, prior to the start of the procedure. A Minimum amount of Erin [name], Erin [name] ([id number]) [phone].pdf Page 8 of 10 bleeding was controlled with Pressure. The procedure was tolerated well with a pain level  of 0 throughout and a pain level of 0 following the procedure. Post Debridement Measurements: 0.6cm length x 0.6cm width x 0.1cm depth; 0.028cm^3 volume. Character of Wound/Ulcer Post Debridement is improved. Severity of Tissue Post Debridement is: Fat layer exposed. Post procedure Diagnosis Wound #1: Same as Pre-Procedure Plan Follow-up Appointments: Return Appointment in 1 week. - Dr. Lady Gary - room 1 Tuesday 11/26 @ 2:00 pm Anesthetic: (In clinic) Topical Lidocaine 4% applied to wound bed Bathing/ Shower/ Hygiene: May shower with protection but do not get wound dressing(s) wet. Protect dressing(s) with water repellant cover (for example, large plastic bag) or a cast cover and may then take shower. Edema Control - Orders / Instructions: Elevate legs to the level of the heart or above for 30 minutes daily and/or when sitting for 3-4 times a day throughout the day. Avoid standing for long periods of time. Exercise regularly WOUND #1: - Lower Leg Wound Laterality: Left, Medial Cleanser: Soap and Water 1 x Per Week/30 Days Discharge Instructions: May shower and wash wound with dial antibacterial soap and water prior to dressing change. Cleanser: Wound Cleanser 1 x Per Week/30 Days Discharge Instructions: Cleanse the wound with wound cleanser prior to applying a clean dressing using gauze sponges, not tissue or cotton balls. Peri-Wound Care: Triamcinolone 15 (g) 1 x Per Week/30 Days Discharge Instructions: Use triamcinolone 15 (g) as directed Peri-Wound Care: Sween Lotion (Moisturizing lotion) 1 x Per Week/30 Days Discharge Instructions: Apply  moisturizing lotion as directed Topical: Gentamicin 1 x Per Week/30 Days Discharge Instructions: As directed by physician Topical: Mupirocin Ointment 1 x Per Week/30 Days Discharge Instructions: Apply Mupirocin (Bactroban) as instructed Topical: Skintegrity Hydrogel 4 (oz) 1 x Per Week/30 Days Discharge Instructions: Apply hydrogel as directed Prim Dressing: Endoform 2x2 in 1 x Per Week/30 Days ary Discharge Instructions: Moisten with saline Secondary Dressing: Optifoam Non-Adhesive Dressing, 4x4 in 1 x Per Week/30 Days Discharge Instructions: Apply over primary dressing as directed. Com pression Wrap: Kerlix Roll 4.5x3.1 (in/yd) 1 x Per Week/30 Days Discharge Instructions: Apply Kerlix and Coban compression as directed. Com pression Wrap: Coban Self-Adherent Wrap 4x5 (in/yd) 1 x Per Week/30 Days Discharge Instructions: Apply over Kerlix as directed. 05/26/2023: The ankle wound has contracted since her last visit. The surface has a bit of slough on it. Edema control is good. I used a curette to debride slough and subcutaneous tissue from the wound. We will continue topical gentamicin and mupirocin with endoform and an Optifoam covering. Continue Kerlix and Coban wrap. Follow-up in 1 week. Electronic Signature(s) Signed: 05/26/2023 2:51:42 PM By: Duanne Guess MD FACS Entered By: Duanne Guess on 05/26/2023 11:51:42 -------------------------------------------------------------------------------- HxROS Details Patient Name: Date of Service: [name], [name] 05/26/2023 2:00 PM Medical Record Number: [id number] Patient Account Number: [id number] Date of Birth/Sex: Treating RN: 05-Jul-1941 (82 y.o. F) Primary Care Provider: [name] Other Clinician: Referring Provider: Treating Provider/Extender: [name], Serafina Royals Weeks in Treatment: 25 Eyes Medical History: Positive for: Erin [name], Erin [name] ([id number])  [id number] Page 9 of 10 Hematologic/Lymphatic Medical History: Past Medical History Notes: thrombocytopenia Cardiovascular Medical History: Positive for: Arrhythmia - bundle branch block; Hypertension; Peripheral Venous Disease Gastrointestinal Medical History: Past Medical History Notes: diverticulosis, GERD Endocrine Medical History: Past Medical History Notes: hypothyroidism Genitourinary Medical History: Past Medical History Notes: CKD stage III Musculoskeletal Medical History: Positive for: Gout; Osteoarthritis Past Medical History Notes: DJD, osteoporosis Oncologic Medical History: Past Medical History Notes: breast cancer HBO Extended History Items Eyes: Cataracts  Immunizations Pneumococcal Vaccine: Received Pneumococcal Vaccination: Yes Received Pneumococcal Vaccination On or After 60th Birthday: No Implantable Devices No devices added Hospitalization / Surgery History Type of Hospitalization/Surgery Salpingoophorectomy Colon resection Appendectomy Cystoscopy w/ retrogrades Joint replacement Mastectomy right gastric ulcer repair Abdominal hysterectomy Family and Social History Cancer: Yes - Siblings; Diabetes: No; Heart Disease: No; Hereditary Spherocytosis: No; Hypertension: No; Kidney Disease: No; Lung Disease: Yes - Siblings; Seizures: No; Stroke: No; Thyroid Problems: No; Tuberculosis: No; Never smoker; Marital Status - Widowed; Alcohol Use: Never; Drug Use: No History; Caffeine Use: Moderate; Financial Concerns: No; Food, Clothing or Shelter Needs: No; Support System Lacking: No; Transportation Concerns: No Electronic Signature(s) Signed: 05/26/2023 3:47:16 PM By: Duanne Guess MD FACS Entered By: Duanne Guess on 05/26/2023 11:49:59 Erin [name] ([id number]) [id number] Page 10 of 10 -------------------------------------------------------------------------------- SuperBill  Details Patient Name: Date of Service: [name], [name] 05/26/2023 Medical Record Number: [id number] Patient Account Number: [id number] Date of Birth/Sex: Treating RN: [birth year] (82 y.o. F) Primary Care Provider: [name] Other Clinician: Referring Provider: Treating Provider/Extender: [name], [name] in Treatment: 25 Diagnosis Coding ICD-10 Codes Code Description [phone] Non-pressure chronic ulcer of other part of left lower leg with fat layer exposed I87.332 Chronic venous hypertension (idiopathic) with ulcer and inflammation of left lower extremity I10 Essential (primary) hypertension Facility Procedures : CPT4 Code: [id number] Description: 11042 - DEB SUBQ TISSUE 20 SQ CM/< ICD-10 Diagnosis Description L97.822 Non-pressure chronic ulcer of other part of left lower leg with fat layer expo Modifier: sed Quantity: 1 Physician Procedures : CPT4 Code Description Modifier [id number] - WC PHYS LEVEL 4 - EST PT ICD-10 Diagnosis Description L97.822 Non-pressure chronic ulcer of other part of left lower leg with fat layer exposed I87.332 Chronic venous hypertension (idiopathic) with ulcer  and inflammation of left lower extremity I10 Essential (primary) hypertension Quantity: 1 : [id number] 11042 - WC PHYS SUBQ TISS 20 SQ CM ICD-10 Diagnosis Description L97.822 Non-pressure chronic ulcer of other part of left lower leg with fat layer exposed Quantity: 1 Electronic Signature(s) Signed: 05/26/2023 2:52:25 PM By: Duanne Guess MD FACS Entered By: Duanne Guess on 05/26/2023 11:52:25

## 2023-06-02 ENCOUNTER — Encounter (HOSPITAL_BASED_OUTPATIENT_CLINIC_OR_DEPARTMENT_OTHER): Payer: Medicare HMO | Admitting: General Surgery

## 2023-06-02 DIAGNOSIS — N183 Chronic kidney disease, stage 3 unspecified: Secondary | ICD-10-CM | POA: Diagnosis not present

## 2023-06-02 DIAGNOSIS — I872 Venous insufficiency (chronic) (peripheral): Secondary | ICD-10-CM | POA: Diagnosis not present

## 2023-06-02 DIAGNOSIS — M1990 Unspecified osteoarthritis, unspecified site: Secondary | ICD-10-CM | POA: Diagnosis not present

## 2023-06-02 DIAGNOSIS — I87332 Chronic venous hypertension (idiopathic) with ulcer and inflammation of left lower extremity: Secondary | ICD-10-CM | POA: Diagnosis not present

## 2023-06-02 DIAGNOSIS — I129 Hypertensive chronic kidney disease with stage 1 through stage 4 chronic kidney disease, or unspecified chronic kidney disease: Secondary | ICD-10-CM | POA: Diagnosis not present

## 2023-06-02 DIAGNOSIS — M109 Gout, unspecified: Secondary | ICD-10-CM | POA: Diagnosis not present

## 2023-06-02 DIAGNOSIS — L97822 Non-pressure chronic ulcer of other part of left lower leg with fat layer exposed: Secondary | ICD-10-CM | POA: Diagnosis not present

## 2023-06-02 DIAGNOSIS — E039 Hypothyroidism, unspecified: Secondary | ICD-10-CM | POA: Diagnosis not present

## 2023-06-03 NOTE — Progress Notes (Signed)
Erin, [name] ([id number]) [id number] Page 1 of 10 Visit Report for 06/02/2023 Chief Complaint Document Details Patient Name: Date of Service: [name], [name] RBA RA N. 06/02/2023 2:00 PM Medical Record Number: [id number] Patient Account Number: [id number] Date of Birth/Sex: Treating RN: 1941-03-11 (82 y.o. F) Primary Care Provider: [name] Other Clinician: Referring Provider: Treating Provider/Extender: [name] [name] in Treatment: 67 Information Obtained from: Patient Chief Complaint Patient presents for treatment of an open ulcer due to venous insufficiency Electronic Signature(s) Signed: 06/02/2023 2:56:33 PM By: Duanne Guess MD FACS Entered By: Duanne Guess on 06/02/2023 14:56:33 -------------------------------------------------------------------------------- Debridement Details Patient Name: Date of Service: [name], [name] 06/02/2023 2:00 PM Medical Record Number: [id number] Patient Account Number: [id number] Date of Birth/Sex: Treating RN: [birth year] (82 y.o. [name] Primary Care Provider: [name] Other Clinician: Referring Provider: Treating Provider/Extender: [name] [name] in Treatment: 26 Debridement Performed for Assessment: Wound #1 Left,Medial Lower Leg Performed By: Physician Duanne Guess, MD The following information was scribed by: Zenaida Deed The information was scribed for: Duanne Guess Debridement Type: Debridement Severity of Tissue Pre Debridement: Fat layer exposed Level of Consciousness (Pre-procedure): Awake and Alert Pre-procedure Verification/Time Out Yes - 14:10 Taken: Start Time: 14:14 Pain Control: Lidocaine 4% T opical Solution Percent of Wound Bed Debrided: 100% T Area Debrided (cm): otal 0.44 Tissue and other material debrided: Viable, Non-Viable, Slough, Subcutaneous, Skin: Epidermis, Slough Level:  Skin/Subcutaneous Tissue Debridement Description: Excisional Instrument: Curette Bleeding: Minimum Hemostasis Achieved: Pressure Procedural Pain: 2 Post Procedural Pain: 1 Response to Treatment: Procedure was tolerated well Level of Consciousness (Post- Awake and Alert procedure): Post Debridement Measurements of Total Wound Length: (cm) 0.8 Width: (cm) 0.7 Depth: (cm) 0.1 Volume: (cm) 0.044 Erin [name], Erin [name] ([id number]) [id number] Page 2 of 10 Character of Wound/Ulcer Post Debridement: Improved Severity of Tissue Post Debridement: Fat layer exposed Post Procedure Diagnosis Same as Pre-procedure Electronic Signature(s) Signed: 06/02/2023 5:13:28 PM By: Zenaida Deed RN, BSN Signed: 06/02/2023 5:33:59 PM By: Duanne Guess MD FACS Entered By: Zenaida Deed on 06/02/2023 14:20:05 -------------------------------------------------------------------------------- HPI Details Patient Name: Date of Service: [name], [name] 06/02/2023 2:00 PM Medical Record Number: [id number] Patient Account Number: [id number] Date of Birth/Sex: Treating RN: 09-20-1940 (82 y.o. F) Primary Care Provider: [name] Other Clinician: Referring Provider: Treating Provider/Extender: [name] [name] in Treatment: 61 History of Present Illness HPI Description: ADMISSION 11/26/2022 This is an 82 year old woman who was referred by her primary care provider for a persistent nonhealing wound on her left lower extremity. It has been present since February of this year. 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 I do not see any formal venous reflux studies in the electronic medical record. She is not diabetic. She does not smoke. Her PCP has been washing her leg each week and applying a Kerlix and Coban wrap, but has not performed any formal  debridement and has not been using any sort of topical contact layer or ointment. ABI in clinic was noncompressible, but she has a palpable pedal pulse. 12/10/2022: The wound is a bit smaller and cleaner today. There is still fairly heavy slough on the surface. Edema control is acceptable. 12/16/2022: The wound is measuring the slightest bit smaller today. There is still thick slough on the surface. Edema control is good. 12/23/2022: The wound is  a little bit smaller again today. Still with fairly heavy slough accumulation. Edema control is good. She has a new small wound on the proximal portion of the same leg. It looks as though she may have developed a small blister over the weekend subsequently ruptured. There is minimal slough on the surface. 12/30/2022: The wound is about the same size, but substantially cleaner. There is some hypertrophic granulation tissue starting to emerge. The wound that was new last week has closed but she has a different new wound today on the lateral aspect of her left lower leg. It also appears to have been a blister that opened and ruptured. There is a little bit of slough on the surface. 01/06/2023: She has new wounds today. She has a wound on the PIP knuckle of her left third and fourth toes, as well as an anterior tibial wound. She says that the wrap was too tight and it also looks like when she pushes her foot into her shoe, it shifts the wrapping back to her midfoot causing bunching up of the wrap material and pain. The lateral leg wounds are both smaller with a little slough and eschar present. The original medial leg wound is smaller and more superficial. There is slough on the surface. 01/13/2023: She has new wounds today. The anterior tibial wound has a satellite lesion that has opened up adjacent to it. Has additional wounds on her fifth toe in addition to the existing ones on her third and fourth toe. The lateral leg wounds seem to have healed. The original medial  leg wound is also smaller, but she and her daughter have several questions regarding why we are not making forward progress, all of which are very reasonable. 01/20/2023: The wounds on her toes have healed. There is a tiny wound on the dorsum of her foot with a little bit of slough on it. The anterolateral leg wound has epithelialized quite substantially and the satellite that had opened last week is now closed. The medial leg wound measured narrower today. It has hypertrophic granulation tissue and slough present. 01/27/2023: The small wounds have all healed. The medial leg wound is smaller as well, with some slough on the surface but without reaccumulation of the hypertrophic granulation tissue. 02/03/2023: The only remaining open wound is the left medial leg wound. It is smaller and a bit more superficial. There is still slough accumulation on the surface. No hypertrophic granulation tissue. She had both her venous reflux and lower extremity arterial studies, both of which were essentially normal, although her TBI's are slightly diminished. 02/10/2023: The left medial leg wound has some granulation tissue filling in, but overall, it is still fairly fibrotic. There is some slough on the surface today. 02/18/2023: The moisture balance and surface consistency have both improved. There is some slough over granulation tissue. Edema control is good. 02/24/2023: The wound measured smaller today. Moisture balance is good. Slough has reaccumulated. Edema remains well-controlled. 03/03/2023: The wound is a little bit smaller today. Moisture balance is stable. Minimal slough accumulation. There is more granulation tissue filling in over the fibrotic surface. Edema is well-controlled. 03/10/2023: The wound is smaller again today. Moisture balance is good. Light slough accumulation on the fibrotic surface. Edema is well-controlled. 03/17/2023: The wound is slightly smaller again today. There is minimal slough  accumulation but the underlying surface remains quite fibrotic. Edema control is good. Erin [name], Erin [name] ([id number]) [id number] Page 3 of 10 03/24/2023: Although the wound measurements are the same today, visually  it looks smaller. There is still a fairly fibrotic surface underneath and slough. Edema remains well-controlled. 03/31/2023: She has a new wound that was identified when her wraps and dressings were removed today. It is just to the midline of the anterior tibial surface. The fat layer is exposed. There is a little bit of eschar and dry skin along with some thin slough. The wound closer to her ankle measured smaller today. The surface continues to improve although portions of it still remain fibrotic. There is slough buildup present. 04/07/2023: The wound that was new last week has nearly closed, but remains open by perhaps about a millimeter. The wound closer to her ankle had no significant change based on measurements, but visually it looks smaller to me. It is a little bit dry today and the surface is still fibrotic. 04/14/2023: The more proximal wound has completely healed. The wound at her ankle is quite a bit smaller with substantial epithelialization. There is just a small open portion of the distal aspect. It is covered with slough and eschar. 04/21/2023: The ankle wound is just the slightest bit smaller but it is shallower. There is slough and eschar accumulation. She has a new wound on her left posteromedial calf. It looks as though there was some friction or pinching, potentially from the Ace bandage. There is a bit of slough on the surface as well as some old blood. 04/28/2023: The ankle wound did not change in size this week. There is a little slough on the surface. The wound on her left posteromedial calf is covered with a layer of eschar. Underneath, it is smaller and clean. Edema control is good. 05/05/2023: The ankle wound has contracted quite a bit  this week. It is extremely superficial with just a little thin eschar around the edges. The left posteromedial calf wound is healed. She had a small pustule just distal to this that was marked as a new wound, but I am not sure that the epidermis is actually violated here. 05/12/2023: The tiny calf wound that was new last week has healed. The ankle wound is smaller again today and very superficial, but has not yet completely closed. Edema control is good. 05/19/2023: Unfortunately, the ankle wound enlarged over the past week. No malodor or purulent drainage to suggest infection as the etiology and her edema control remains good. 05/26/2023: The ankle wound has contracted since her last visit. The surface has a bit of slough on it. Edema control is good. 06/02/2023: No significant change to the wound this week. Electronic Signature(s) Signed: 06/02/2023 2:57:09 PM By: Duanne Guess MD FACS Entered By: Duanne Guess on 06/02/2023 14:57:09 -------------------------------------------------------------------------------- Physical Exam Details Patient Name: Date of Service: [name], [name] 06/02/2023 2:00 PM Medical Record Number: [id number] Patient Account Number: [id number] Date of Birth/Sex: Treating RN: December 09, 1940 (82 y.o. F) Primary Care Provider: [name] Other Clinician: Referring Provider: Treating Provider/Extender: Erin [name], Erin [name] in Treatment: 54 Constitutional Hypertensive, asymptomatic. . . . no acute distress. Respiratory Normal work of breathing on room air.. Notes 06/02/2023: No significant change to the wound this week. Electronic Signature(s) Signed: 06/02/2023 2:58:05 PM By: Duanne Guess MD FACS Entered By: Duanne Guess on 06/02/2023 14:58:05 -------------------------------------------------------------------------------- Physician Orders Details Patient Name: Date of Service: [name], [name] 06/02/2023 2:00  PM Erin [name] ([id number]) [id number] Page 4 of 10 Medical Record Number: [id number] Patient Account Number: [id number] Date of Birth/Sex: Treating RN: 1941/02/21 (82 y.o.  [name] Primary Care Provider: [name] Other Clinician: Referring Provider: Treating Provider/Extender: [name] [name] in Treatment: 26 The following information was scribed by: Zenaida Deed The information was scribed for: Duanne Guess Verbal / Phone Orders: No Diagnosis Coding ICD-10 Coding Code Description [phone] Non-pressure chronic ulcer of other part of left lower leg with fat layer exposed I87.332 Chronic venous hypertension (idiopathic) with ulcer and inflammation of left lower extremity I10 Essential (primary) hypertension Follow-up Appointments Return appointment in 3 [name]. - Dr. Lady Gary 12/17 Nurse Visit: - next 2 [name] Anesthetic (In clinic) Topical Lidocaine 4% applied to wound bed Bathing/ Shower/ Hygiene May shower with protection but do not get wound dressing(s) wet. Protect dressing(s) with water repellant cover (for example, large plastic bag) or a cast cover and may then take shower. Edema Control - Orders / Instructions Left Lower Extremity Elevate legs to the level of the heart or above for 30 minutes daily and/or when sitting for 3-4 times a day throughout the day. Avoid standing for long periods of time. Exercise regularly Wound Treatment Wound #1 - Lower Leg Wound Laterality: Left, Medial Cleanser: Soap and Water 1 x Per Week/30 Days Discharge Instructions: May shower and wash wound with dial antibacterial soap and water prior to dressing change. Cleanser: Wound Cleanser 1 x Per Week/30 Days Discharge Instructions: Cleanse the wound with wound cleanser prior to applying a clean dressing using gauze sponges, not tissue or cotton balls. Peri-Wound Care: Triamcinolone 15 (g) 1 x Per Week/30  Days Discharge Instructions: Use triamcinolone 15 (g) as directed Peri-Wound Care: Sween Lotion (Moisturizing lotion) 1 x Per Week/30 Days Discharge Instructions: Apply moisturizing lotion as directed Topical: Gentamicin 1 x Per Week/30 Days Discharge Instructions: As directed by physician Topical: Mupirocin Ointment 1 x Per Week/30 Days Discharge Instructions: Apply Mupirocin (Bactroban) as instructed Topical: Skintegrity Hydrogel 4 (oz) 1 x Per Week/30 Days Discharge Instructions: Apply hydrogel as directed Prim Dressing: Promogran Prisma Matrix, 4.34 (sq in) (silver collagen) 1 x Per Week/30 Days ary Discharge Instructions: Moisten collagen with saline or hydrogel Secondary Dressing: Optifoam Non-Adhesive Dressing, 4x4 in 1 x Per Week/30 Days Discharge Instructions: Apply over primary dressing as directed. Compression Wrap: Kerlix Roll 4.5x3.1 (in/yd) 1 x Per Week/30 Days Discharge Instructions: Apply Kerlix and Coban compression as directed. Compression Wrap: Coban Self-Adherent Wrap 4x5 (in/yd) 1 x Per Week/30 Days Discharge Instructions: Apply over Kerlix as directed. Laboratory naerobe culture (MICRO) Bacteria identified in Unspecified specimen by A LOINC Code: 635-3 Convenience Name: Anaerobic culture Erin [name], Erin [name] ([id number]) [id number] Page 5 of 10 Electronic Signature(s) Signed: 06/02/2023 5:33:59 PM By: Duanne Guess MD FACS Entered By: Duanne Guess on 06/02/2023 14:58:28 -------------------------------------------------------------------------------- Problem List Details Patient Name: Date of Service: [name], [name] 06/02/2023 2:00 PM Medical Record Number: [id number] Patient Account Number: [id number] Date of Birth/Sex: Treating RN: 24-Sep-1940 (82 y.o. [name] Primary Care Provider: [name] Other Clinician: Referring Provider: Treating Provider/Extender: [name] [name]  in Treatment: 26 Active Problems ICD-10 Encounter Code Description Active Date MDM Diagnosis L97.822 Non-pressure chronic ulcer of other part of left lower leg with fat layer exposed5/22/2024 No Yes I87.332 Chronic venous hypertension (idiopathic) with ulcer and inflammation of left 11/26/2022 No Yes lower extremity I10 Essential (primary) hypertension 11/26/2022 No Yes Inactive Problems Resolved Problems ICD-10 Code Description Active Date Resolved Date L97.222 Non-pressure chronic ulcer of left calf with fat layer exposed 04/21/2023 04/21/2023 L97.522 Non-pressure chronic ulcer  of other part of left foot with fat layer exposed 01/13/2023 01/13/2023 Electronic Signature(s) Signed: 06/02/2023 2:56:15 PM By: Duanne Guess MD FACS Entered By: Duanne Guess on 06/02/2023 14:56:14 -------------------------------------------------------------------------------- Progress Note Details Patient Name: Date of Service: [name], [name] 06/02/2023 2:00 PM Medical Record Number: [id number] Patient Account Number: [id number] Date of Birth/Sex: Treating RN: 07-19-1940 (82 y.o. F) Primary Care Provider: [name] Other Clinician: Referring Provider: Treating Provider/Extender: Erin [name], Erin [name] ([id number]) [id number] Page 6 of 10 [name] in Treatment: 26 Subjective Chief Complaint Information obtained from Patient Patient presents for treatment of an open ulcer due to venous insufficiency History of Present Illness (HPI) ADMISSION 11/26/2022 This is an 82 year old woman who was referred by her primary care provider for a persistent nonhealing wound on her left lower extremity. It has been present since February of this year. 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 I do not see any formal venous reflux studies in the  electronic medical record. She is not diabetic. She does not smoke. Her PCP has been washing her leg each week and applying a Kerlix and Coban wrap, but has not performed any formal debridement and has not been using any sort of topical contact layer or ointment. ABI in clinic was noncompressible, but she has a palpable pedal pulse. 12/10/2022: The wound is a bit smaller and cleaner today. There is still fairly heavy slough on the surface. Edema control is acceptable. 12/16/2022: The wound is measuring the slightest bit smaller today. There is still thick slough on the surface. Edema control is good. 12/23/2022: The wound is a little bit smaller again today. Still with fairly heavy slough accumulation. Edema control is good. She has a new small wound on the proximal portion of the same leg. It looks as though she may have developed a small blister over the weekend subsequently ruptured. There is minimal slough on the surface. 12/30/2022: The wound is about the same size, but substantially cleaner. There is some hypertrophic granulation tissue starting to emerge. The wound that was new last week has closed but she has a different new wound today on the lateral aspect of her left lower leg. It also appears to have been a blister that opened and ruptured. There is a little bit of slough on the surface. 01/06/2023: She has new wounds today. She has a wound on the PIP knuckle of her left third and fourth toes, as well as an anterior tibial wound. She says that the wrap was too tight and it also looks like when she pushes her foot into her shoe, it shifts the wrapping back to her midfoot causing bunching up of the wrap material and pain. The lateral leg wounds are both smaller with a little slough and eschar present. The original medial leg wound is smaller and more superficial. There is slough on the surface. 01/13/2023: She has new wounds today. The anterior tibial wound has a satellite lesion that has opened up  adjacent to it. Has additional wounds on her fifth toe in addition to the existing ones on her third and fourth toe. The lateral leg wounds seem to have healed. The original medial leg wound is also smaller, but she and her daughter have several questions regarding why we are not making forward progress, all of which are very reasonable. 01/20/2023: The wounds on her toes have healed. There  is a tiny wound on the dorsum of her foot with a little bit of slough on it. The anterolateral leg wound has epithelialized quite substantially and the satellite that had opened last week is now closed. The medial leg wound measured narrower today. It has hypertrophic granulation tissue and slough present. 01/27/2023: The small wounds have all healed. The medial leg wound is smaller as well, with some slough on the surface but without reaccumulation of the hypertrophic granulation tissue. 02/03/2023: The only remaining open wound is the left medial leg wound. It is smaller and a bit more superficial. There is still slough accumulation on the surface. No hypertrophic granulation tissue. She had both her venous reflux and lower extremity arterial studies, both of which were essentially normal, although her TBI's are slightly diminished. 02/10/2023: The left medial leg wound has some granulation tissue filling in, but overall, it is still fairly fibrotic. There is some slough on the surface today. 02/18/2023: The moisture balance and surface consistency have both improved. There is some slough over granulation tissue. Edema control is good. 02/24/2023: The wound measured smaller today. Moisture balance is good. Slough has reaccumulated. Edema remains well-controlled. 03/03/2023: The wound is a little bit smaller today. Moisture balance is stable. Minimal slough accumulation. There is more granulation tissue filling in over the fibrotic surface. Edema is well-controlled. 03/10/2023: The wound is smaller again today. Moisture  balance is good. Light slough accumulation on the fibrotic surface. Edema is well-controlled. 03/17/2023: The wound is slightly smaller again today. There is minimal slough accumulation but the underlying surface remains quite fibrotic. Edema control is good. 03/24/2023: Although the wound measurements are the same today, visually it looks smaller. There is still a fairly fibrotic surface underneath and slough. Edema remains well-controlled. 03/31/2023: She has a new wound that was identified when her wraps and dressings were removed today. It is just to the midline of the anterior tibial surface. The fat layer is exposed. There is a little bit of eschar and dry skin along with some thin slough. The wound closer to her ankle measured smaller today. The surface continues to improve although portions of it still remain fibrotic. There is slough buildup present. 04/07/2023: The wound that was new last week has nearly closed, but remains open by perhaps about a millimeter. The wound closer to her ankle had no significant change based on measurements, but visually it looks smaller to me. It is a little bit dry today and the surface is still fibrotic. 04/14/2023: The more proximal wound has completely healed. The wound at her ankle is quite a bit smaller with substantial epithelialization. There is just a small open portion of the distal aspect. It is covered with slough and eschar. 04/21/2023: The ankle wound is just the slightest bit smaller but it is shallower. There is slough and eschar accumulation. She has a new wound on her left posteromedial calf. It looks as though there was some friction or pinching, potentially from the Ace bandage. There is a bit of slough on the surface as well as some old blood. 04/28/2023: The ankle wound did not change in size this week. There is a little slough on the surface. The wound on her left posteromedial calf is covered with a layer of eschar. Underneath, it is smaller  and clean. Edema control is good. 05/05/2023: The ankle wound has contracted quite a bit this week. It is extremely superficial with just a little thin eschar around the edges. The left posteromedial calf wound  is healed. She had a small pustule just distal to this that was marked as a new wound, but I am not sure that the epidermis is actually violated here. 05/12/2023: The tiny calf wound that was new last week has healed. The ankle wound is smaller again today and very superficial, but has not yet completely closed. Edema control is good. Erin [name], Erin [name] ([id number]) [id number] Page 7 of 10 05/19/2023: Unfortunately, the ankle wound enlarged over the past week. No malodor or purulent drainage to suggest infection as the etiology and her edema control remains good. 05/26/2023: The ankle wound has contracted since her last visit. The surface has a bit of slough on it. Edema control is good. 06/02/2023: No significant change to the wound this week. Patient History Family History Cancer - Siblings, Lung Disease - Siblings, No family history of Diabetes, Heart Disease, Hereditary Spherocytosis, Hypertension, Kidney Disease, Seizures, Stroke, Thyroid Problems, Tuberculosis. Social History Never smoker, Marital Status - Widowed, Alcohol Use - Never, Drug Use - No History, Caffeine Use - Moderate. Medical History Eyes Patient has history of Cataracts Cardiovascular Patient has history of Arrhythmia - bundle branch block, Hypertension, Peripheral Venous Disease Musculoskeletal Patient has history of Gout, Osteoarthritis Hospitalization/Surgery History - Salpingoophorectomy. - Colon resection. - Appendectomy. - Cystoscopy w/ retrogrades. - Joint replacement. - Mastectomy right. - gastric ulcer repair. - Abdominal hysterectomy. Medical A Surgical History Notes nd Hematologic/Lymphatic thrombocytopenia Gastrointestinal diverticulosis,  GERD Endocrine hypothyroidism Genitourinary CKD stage III Musculoskeletal DJD, osteoporosis Oncologic breast cancer Objective Constitutional Hypertensive, asymptomatic. no acute distress. Vitals Time Taken: 1:54 AM, Height: 60 in, Weight: 138 lbs, BMI: 26.9, Temperature: 97.9 F, Pulse: 64 bpm, Respiratory Rate: 18 breaths/min, Blood Pressure: 186/80 mmHg. Respiratory Normal work of breathing on room air.. General Notes: 06/02/2023: No significant change to the wound this week. Integumentary (Hair, Skin) Wound #1 status is Open. Original cause of wound was Gradually Appeared. The date acquired was: 08/29/2022. The wound has been in treatment 26 [name]. The wound is located on the Left,Medial Lower Leg. The wound measures 0.8cm length x 0.7cm width x 0.1cm depth; 0.44cm^2 area and 0.044cm^3 volume. There is Fat Layer (Subcutaneous Tissue) exposed. There is no tunneling or undermining noted. There is a medium amount of serosanguineous drainage noted. The wound margin is flat and intact. There is medium (34-66%) pink granulation within the wound bed. There is a medium (34-66%) amount of necrotic tissue within the wound bed including Adherent Slough. The periwound skin appearance had no abnormalities noted for texture. The periwound skin appearance had no abnormalities noted for moisture. The periwound skin appearance had no abnormalities noted for color. Periwound temperature was noted as No Abnormality. The periwound has tenderness on palpation. 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 There was a Excisional Skin/Subcutaneous Tissue Debridement with a total area of 0.44 sq cm performed by Duanne Guess, MD. With the following instrument(s): Curette to remove Viable and Non-Viable tissue/material. Material removed includes Subcutaneous Tissue, Slough, and Skin: Epidermis after achieving pain control using Lidocaine 4% Topical Solution. No specimens were taken. A time out was conducted at 14:10, prior to the start of the  procedure. A Minimum amount of bleeding was controlled with Pressure. The procedure was tolerated well with a pain level of 2 throughout and a pain level of 1 following the procedure. Post Debridement Measurements: 0.8cm length x 0.7cm width x 0.1cm depth; 0.044cm^3 volume. Character of Wound/Ulcer Post Debridement is improved. Severity of Tissue Post Debridement is: Fat layer exposed. Post procedure Diagnosis Wound #1: Same as Pre-Procedure Plan Follow-up Appointments: Return appointment in 3 [name]. - Dr. Lady Gary 12/17 Nurse Visit: - next 2 [name] Anesthetic: (In clinic) Topical Lidocaine 4% applied to wound bed Bathing/ Shower/ Hygiene: May shower with protection but do not get wound dressing(s) wet. Protect dressing(s) with water repellant cover (for example, large plastic bag) or a cast cover and may then take shower. Edema Control - Orders / Instructions: Elevate legs to the level of the heart or above for 30 minutes daily and/or when sitting for 3-4 times a day throughout the day. Avoid standing for long periods of time. Exercise regularly Laboratory ordered were: Anaerobic culture WOUND #1: - Lower Leg Wound Laterality: Left, Medial Cleanser: Soap and Water 1 x Per Week/30 Days Discharge Instructions: May shower and wash wound with dial antibacterial soap and water prior to dressing change. Cleanser: Wound Cleanser 1 x Per Week/30 Days Discharge Instructions: Cleanse the wound with wound cleanser  prior to applying a clean dressing using gauze sponges, not tissue or cotton balls. Peri-Wound Care: Triamcinolone 15 (g) 1 x Per Week/30 Days Discharge Instructions: Use triamcinolone 15 (g) as directed Peri-Wound Care: Sween Lotion (Moisturizing lotion) 1 x Per Week/30 Days Discharge Instructions: Apply moisturizing lotion as directed Topical: Gentamicin 1 x Per Week/30 Days Discharge Instructions: As directed by physician Topical: Mupirocin Ointment 1 x Per Week/30 Days Discharge Instructions: Apply Mupirocin (Bactroban) as instructed Topical: Skintegrity Hydrogel 4 (oz) 1 x Per Week/30 Days Discharge Instructions: Apply hydrogel as directed Prim Dressing: Promogran Prisma Matrix, 4.34 (sq in) (silver collagen) 1 x Per Week/30 Days ary Discharge Instructions: Moisten collagen with saline or hydrogel Secondary Dressing: Optifoam Non-Adhesive Dressing, 4x4 in 1 x Per Week/30 Days Discharge Instructions: Apply over primary dressing as directed. Com pression Wrap: Kerlix Roll 4.5x3.1 (in/yd) 1 x Per Week/30 Days Discharge Instructions: Apply Kerlix and Coban compression as directed. Com pression Wrap: Coban Self-Adherent Wrap 4x5 (in/yd) 1 x Per Week/30 Days Discharge Instructions: Apply over Kerlix as directed. 06/02/2023: No significant change to the wound this week. I used a curette to debride slough and subcutaneous tissue from the wound. Due to the deterioration that we saw previously and lack of improvement since that time, I did take a culture to assess for any potential infectious reason for why this is happening. There are no overt signs of clinical infection at this time. We are using topical gentamicin and mupirocin to suppress normal skin flora in the hopes that this will help with wound healing. I am also going to change her contact layer to Prisma silver collagen in place of endoform. We will continue using hydrogel and Optifoam covering to maintain moisture balance.  Continue Kerlix and Coban wrap. Follow-up for nurse visits for the next 2 [name], due to clinic scheduling and provider availability. I will see her in 3 [name]. Electronic Signature(s) Signed: 06/02/2023 3:00:01 PM By: Duanne Guess MD FACS Entered By: Duanne Guess on 06/02/2023 15:00:01 Erin [name] ([id number]) [id number] Page 9 of 10 -------------------------------------------------------------------------------- HxROS Details Patient Name: Date of Service: [name], [name] 06/02/2023 2:00 PM Medical Record Number: [id number]  Patient Account Number: [id number] Date of Birth/Sex: Treating RN: 05-15-1941 (82 y.o. F) Primary Care Provider: [name] Other Clinician: Referring Provider: Treating Provider/Extender: Erin [name], Erin [name] in Treatment: 88 Eyes Medical History: Positive for: Cataracts Hematologic/Lymphatic Medical History: Past Medical History Notes: thrombocytopenia Cardiovascular Medical History: Positive for: Arrhythmia - bundle branch block; Hypertension; Peripheral Venous Disease Gastrointestinal Medical History: Past Medical History Notes: diverticulosis, GERD Endocrine Medical History: Past Medical History Notes: hypothyroidism Genitourinary Medical History: Past Medical History Notes: CKD stage III Musculoskeletal Medical History: Positive for: Gout; Osteoarthritis Past Medical History Notes: DJD, osteoporosis Oncologic Medical History: Past Medical History Notes: breast cancer HBO Extended History Items Eyes: Cataracts Immunizations Pneumococcal Vaccine: Received Pneumococcal Vaccination: Yes Received Pneumococcal Vaccination On or After 60th Birthday: No Implantable Devices No devices added Hospitalization / Surgery History Type of Hospitalization/Surgery Salpingoophorectomy Colon resection Appendectomy Cystoscopy w/ retrogrades Joint replacement Mastectomy  right Erin [name], Erin [name] ([id number]) [id number] Page 10 of 10 gastric ulcer repair Abdominal hysterectomy Family and Social History Cancer: Yes - Siblings; Diabetes: No; Heart Disease: No; Hereditary Spherocytosis: No; Hypertension: No; Kidney Disease: No; Lung Disease: Yes - Siblings; Seizures: No; Stroke: No; Thyroid Problems: No; Tuberculosis: No; Never smoker; Marital Status - Widowed; Alcohol Use: Never; Drug Use: No History; Caffeine Use: Moderate; Financial Concerns: No; Food, Clothing or Shelter Needs: No; Support System Lacking: No; Transportation Concerns: No Electronic Signature(s) Signed: 06/02/2023 5:33:59 PM By: Duanne Guess MD FACS Entered By: Duanne Guess on 06/02/2023 14:57:41 -------------------------------------------------------------------------------- SuperBill Details Patient Name: Date of Service: [name], [name] 06/02/2023 Medical Record Number: [id number] Patient Account Number: [id number] Date of Birth/Sex: Treating RN: [birth year] (82 y.o. F) Primary Care Provider: [name] Other Clinician: Referring Provider: Treating Provider/Extender: Erin [name], Erin [name] in Treatment: 26 Diagnosis Coding ICD-10 Codes Code Description [phone] Non-pressure chronic ulcer of other part of left lower leg with fat layer exposed I87.332 Chronic venous hypertension (idiopathic) with ulcer and inflammation of left lower extremity I10 Essential (primary) hypertension Facility Procedures : CPT4 Code: [id number] 1 Description: 1042 - DEB SUBQ TISSUE 20 SQ CM/< ICD-10 Diagnosis Description L97.822 Non-pressure chronic ulcer of other part of left lower leg with fat layer expo Modifier: sed Quantity: 1 Physician Procedures : CPT4 Code Description Modifier [id number] - WC PHYS LEVEL 4 - EST PT 25 ICD-10 Diagnosis Description L97.822 Non-pressure chronic ulcer of other part of left lower leg with fat layer  exposed I87.332 Chronic venous hypertension (idiopathic) with  ulcer and inflammation of left lower extremity I10 Essential (primary) hypertension Quantity: 1 : [id number] 11042 - WC PHYS SUBQ TISS 20 SQ CM ICD-10 Diagnosis Description L97.822 Non-pressure chronic ulcer of other part of left lower leg with fat layer exposed Quantity: 1 Electronic Signature(s) Signed: 06/02/2023 3:00:20 PM By: Duanne Guess MD FACS Entered By: Duanne Guess on 06/02/2023 15:00:19

## 2023-06-03 NOTE — Progress Notes (Signed)
Erin [name], Erin [name] ([id number]) [id number] Page 1 of 8 Visit Report for 06/02/2023 Arrival Information Details Patient Name: Date of Service: [name], [name] RBA RA N. 06/02/2023 2:00 PM Medical Record Number: [id number] Patient Account Number: [id number] Date of Birth/Sex: Treating RN: 1940-11-03 (82 y.o. F) Primary Care Erin [name]: Erin [name] Other Clinician: Referring Erin [name]: Treating Erin [name]/Extender: Erin [name] [name] in [name]: 25 Visit Information History Since Last Visit Added or deleted any medications: No Patient Arrived: Ambulatory Any new allergies or adverse reactions: No Arrival Time: 13:53 Had a fall or experienced change in No Accompanied By: daughter activities of daily living that may affect Transfer Assistance: None risk of falls: Patient Identification Verified: Yes Signs or symptoms of abuse/neglect since last visito No Secondary Verification Process Completed: Yes Hospitalized since last visit: No Patient Requires Transmission-Based Precautions: No Implantable device outside of the clinic excluding No Patient Has Alerts: No cellular tissue based products placed in the center since last visit: Has Dressing in Place as Prescribed: Yes Has Compression in Place as Prescribed: Yes Pain Present Now: No Electronic Signature(s) Signed: 06/02/2023 5:13:28 PM By: Zenaida Deed RN, BSN Entered By: Zenaida Deed on 06/02/2023 13:57:34 -------------------------------------------------------------------------------- Encounter Discharge Information Details Patient Name: Date of Service: [name], [name] 06/02/2023 2:00 PM Medical Record Number: [id number] Patient Account Number: [id number] Date of Birth/Sex: Treating RN: Dec 08, 1940 (82 y.o. Erin [name] Primary Care Erin [name]: Erin [name] Other Clinician: Referring Kahleah Crass: Treating Erin [name]/Extender: Erin [name] [name] in [name]: 42 Encounter Discharge Information Items Post Procedure Vitals Discharge Condition: Stable Temperature (F): 97.9 Ambulatory Status: Ambulatory Pulse (bpm): 64 Discharge Destination: Home Respiratory Rate (breaths/min): 18 Transportation: Private Auto Blood Pressure (mmHg): 186/80 Accompanied By: daughter Schedule Follow-up Appointment: Yes Clinical Summary of Care: Patient Declined Electronic Signature(s) Signed: 06/02/2023 5:13:28 PM By: Zenaida Deed RN, BSN Entered By: Zenaida Deed on 06/02/2023 14:36:29 Erin [name] ([id number]) [id number] Page 2 of 8 -------------------------------------------------------------------------------- Lower Extremity Assessment Details Patient Name: Date of Service: [name], [name] 06/02/2023 2:00 PM Medical Record Number: [id number] Patient Account Number: [id number] Date of Birth/Sex: Treating RN: 14-Nov-1940 (82 y.o. Erin [name] Primary Care Lamont Tant: Erin [name] Other Clinician: Referring Erin [name]: Treating Erin [name]: Erin [name], Erin [name]: 25 Edema Assessment Assessed: [Left: No] [Right: No] Edema: [Left: Ye] [Right: s] Calf Left: Right: Point of Measurement: From Medial Instep 28.8 cm Ankle Left: Right: Point of Measurement: From Medial Instep 18.7 cm Vascular Assessment Pulses: Dorsalis Pedis Palpable: [Left:Yes] Extremity colors, hair growth, and conditions: Extremity Color: [Left:Hyperpigmented] Hair Growth on Extremity: [Left:No] Temperature of Extremity: [Left:Warm < 3 seconds] Electronic Signature(s) Signed: 06/02/2023 5:13:28 PM By: Zenaida Deed RN, BSN Entered By: Zenaida Deed on 06/02/2023 13:58:53 -------------------------------------------------------------------------------- Multi Wound Chart Details Patient Name: Date of Service: [name], [name] 06/02/2023 2:00 PM Medical Record  Number: [id number] Patient Account Number: [id number] Date of Birth/Sex: Treating RN: 12-29-1940 (82 y.o. F) Primary Care Bailei Buist: Erin [name] Other Clinician: Referring Jerick Khachatryan: Treating Erin [name]/Extender: Erin [name], Erin [name]: 26 Vital Signs Height(in): 60 Pulse(bpm): 64 Weight(lbs): 138 Blood Pressure(mmHg): 186/80 Body Mass Index(BMI): 26.9 Temperature(F): 97.9 Respiratory Rate(breaths/min): 18 [1:Photos:] [N/A:N/A] Left, Medial Lower Leg N/A N/A Wound Location: Gradually Appeared N/A N/A Wounding Event: Venous Leg Ulcer N/A N/A Primary Etiology: Cataracts, Arrhythmia, Hypertension, N/A N/A Comorbid History: Peripheral Venous Disease, Gout, Osteoarthritis 08/29/2022 N/A N/A Date Acquired: 25 N/A  N/A [name] of [name]: Open N/A N/A Wound Status: No N/A N/A Wound Recurrence: 0.8x0.7x0.1 N/A N/A Measurements L x W x D (cm) 0.44 N/A N/A A (cm) : rea 0.044 N/A N/A Volume (cm) : 92.50% N/A N/A % Reduction in A rea: 92.50% N/A N/A % Reduction in Volume: Full Thickness Without Exposed N/A N/A Classification: Support Structures Medium N/A N/A Exudate A mount: Serosanguineous N/A N/A Exudate Type: red, brown N/A N/A Exudate Color: Flat and Intact N/A N/A Wound Margin: Medium (34-66%) N/A N/A Granulation A mount: Pink N/A N/A Granulation Quality: Medium (34-66%) N/A N/A Necrotic A mount: Fat Layer (Subcutaneous Tissue): Yes N/A N/A Exposed Structures: Fascia: No Tendon: No Muscle: No Joint: No Bone: No Small (1-33%) N/A N/A Epithelialization: Debridement - Excisional N/A N/A Debridement: Pre-procedure Verification/Time Out 14:10 N/A N/A Taken: Lidocaine 4% Topical Solution N/A N/A Pain Control: Subcutaneous, Slough N/A N/A Tissue Debrided: Skin/Subcutaneous Tissue N/A N/A Level: 0.44 N/A N/A Debridement A (sq cm): rea Curette N/A N/A Instrument: Minimum N/A N/A Bleeding: Pressure N/A  N/A Hemostasis A chieved: 2 N/A N/A Procedural Pain: 1 N/A N/A Post Procedural Pain: Procedure was tolerated well N/A N/A Debridement [name] Response: 0.8x0.7x0.1 N/A N/A Post Debridement Measurements L x W x D (cm) 0.044 N/A N/A Post Debridement Volume: (cm) No Abnormalities Noted N/A N/A Periwound Skin Texture: No Abnormalities Noted N/A N/A Periwound Skin Moisture: No Abnormalities Noted N/A N/A Periwound Skin Color: No Abnormality N/A N/A Temperature: Yes N/A N/A Tenderness on Palpation: Debridement N/A N/A Procedures Performed: [name] Notes Wound #1 (Lower Leg) Wound Laterality: Left, Medial Cleanser Soap and Water Discharge Instruction: May shower and wash wound with dial antibacterial soap and water prior to dressing change. Wound Cleanser Discharge Instruction: Cleanse the wound with wound cleanser prior to applying a clean dressing using gauze sponges, not tissue or cotton balls. Peri-Wound Care Triamcinolone 15 (g) Discharge Instruction: Use triamcinolone 15 (g) as directed Sween Lotion (Moisturizing lotion) Discharge Instruction: Apply moisturizing lotion as directed Topical Gentamicin Discharge Instruction: As directed by physician Mupirocin Ointment Discharge Instruction: Apply Mupirocin (Bactroban) as instructed Erin [name], Erin [name] ([id number]) [id number] Page 4 of 8 Skintegrity Hydrogel 4 (oz) Discharge Instruction: Apply hydrogel as directed Primary Dressing Promogran Prisma Matrix, 4.34 (sq in) (silver collagen) Discharge Instruction: Moisten collagen with saline or hydrogel Secondary Dressing Optifoam Non-Adhesive Dressing, 4x4 in Discharge Instruction: Apply over primary dressing as directed. Secured With Compression Wrap Kerlix Roll 4.5x3.1 (in/yd) Discharge Instruction: Apply Kerlix and Coban compression as directed. Coban Self-Adherent Wrap 4x5 (in/yd) Discharge Instruction: Apply over Kerlix as  directed. Compression Stockings Add-Ons Electronic Signature(s) Signed: 06/02/2023 2:56:23 PM By: Duanne Guess MD FACS Entered By: Duanne Guess on 06/02/2023 14:56:23 -------------------------------------------------------------------------------- Multi-Disciplinary Care Plan Details Patient Name: Date of Service: [name], [name] 06/02/2023 2:00 PM Medical Record Number: [id number] Patient Account Number: [id number] Date of Birth/Sex: Treating RN: 1941/04/16 (82 y.o. Erin [name] Primary Care Ina Scrivens: Erin [name] Other Clinician: Referring Paulett Kaufhold: Treating Shauntae Reitman/Extender: [name] in [name]: 16 Multidisciplinary Care Plan reviewed with physician Active Inactive Necrotic Tissue Nursing Diagnoses: Impaired tissue integrity related to necrotic/devitalized tissue Knowledge deficit related to management of necrotic/devitalized tissue Goals: Necrotic/devitalized tissue will be minimized in the wound bed Date Initiated: 11/26/2022 Target Resolution Date: 06/05/2023 Goal Status: Active Patient/caregiver will verbalize understanding of reason and process for debridement of necrotic tissue Date Initiated: 11/26/2022 Date Inactivated: 01/20/2023 Target Resolution Date: 03/06/2023 Goal Status: Met Interventions: Assess patient pain level pre-,  during and post procedure and prior to discharge Provide education on necrotic tissue and debridement process [name] Activities: Apply topical anesthetic as ordered : 11/26/2022 Notes: Wound/Skin Impairment Erin [name], Erin [name] ([id number]) [id number] Page 5 of 8 Nursing Diagnoses: Impaired tissue integrity Knowledge deficit related to ulceration/compromised skin integrity Goals: Patient/caregiver will verbalize understanding of skin care regimen Date Initiated: 11/26/2022 Target Resolution Date: 06/05/2023 Goal Status: Active Interventions: Assess  ulceration(s) every visit [name] Activities: Skin care regimen initiated : 11/26/2022 Topical wound management initiated : 11/26/2022 Notes: Electronic Signature(s) Signed: 06/02/2023 5:13:28 PM By: Zenaida Deed RN, BSN Entered By: Zenaida Deed on 06/02/2023 13:51:49 -------------------------------------------------------------------------------- Pain Assessment Details Patient Name: Date of Service: [name], [name] 06/02/2023 2:00 PM Medical Record Number: [id number] Patient Account Number: [id number] Date of Birth/Sex: Treating RN: 12/12/1940 (82 y.o. F) Primary Care Messi Twedt: Erin [name] Other Clinician: Referring Coryn Mosso: Treating August Gosser/Extender: Erin [name], Erin [name]: 25 Active Problems Location of Pain Severity and Description of Pain Patient Has Paino No Site Locations Rate the pain. Current Pain Level: 0 Pain Management and Medication Current Pain Management: Electronic Signature(s) Signed: 06/02/2023 5:13:28 PM By: Zenaida Deed RN, BSN Entered By: Zenaida Deed on 06/02/2023 13:57:50 Erin [name] ([id number]) [id number] Page 6 of 8 -------------------------------------------------------------------------------- Patient/Caregiver Education Details Patient Name: Date of Service: [name], [name] 11/26/2024andnbsp2:00 PM Medical Record Number: [id number] Patient Account Number: [id number] Date of Birth/Gender: Treating RN: [birth year] (82 y.o. Erin [name] Primary Care Physician: Erin [name] Other Clinician: Referring Physician: Treating Physician/Extender: [name] in [name]: 72 Education Assessment Education Provided To: Patient Education Topics Provided Venous: Methods: Explain/Verbal Responses: Reinforcements needed, State content correctly Wound/Skin Impairment: Methods: Explain/Verbal Responses: Reinforcements  needed, State content correctly Electronic Signature(s) Signed: 06/02/2023 5:13:28 PM By: Zenaida Deed RN, BSN Entered By: Zenaida Deed on 06/02/2023 13:52:09 -------------------------------------------------------------------------------- Wound Assessment Details Patient Name: Date of Service: [name], [name] 06/02/2023 2:00 PM Medical Record Number: [id number] Patient Account Number: [id number] Date of Birth/Sex: Treating RN: 1940-08-14 (82 y.o. Erin [name] Primary Care Tunis Gentle: Erin [name] Other Clinician: Referring Daylyn Christine: Treating Rakayla Ricklefs/Extender: Erin [name], Erin [name]: 25 Wound Status Wound Number: 1 Primary Venous Leg Ulcer Etiology: Wound Location: Left, Medial Lower Leg Wound Open Wounding Event: Gradually Appeared Status: Date Acquired: 08/29/2022 Comorbid Cataracts, Arrhythmia, Hypertension, Peripheral Venous [name] Of [name]: 26 History: Disease, Gout, Osteoarthritis Clustered Wound: No Photos Erin [name], Erin [name] ([id number]) [id number] Page 7 of 8 Wound Measurements Length: (cm) 0.8 Width: (cm) 0.7 Depth: (cm) 0.1 Area: (cm) 0.44 Volume: (cm) 0.044 % Reduction in Area: 92.5% % Reduction in Volume: 92.5% Epithelialization: Small (1-33%) Tunneling: No Undermining: No Wound Description Classification: Full Thickness Without Exposed Support Structures Wound Margin: Flat and Intact Exudate Amount: Medium Exudate Type: Serosanguineous Exudate Color: red, brown Foul Odor After Cleansing: No Slough/Fibrino Yes Wound Bed Granulation Amount: Medium (34-66%) Exposed Structure Granulation Quality: Pink Fascia Exposed: No Necrotic Amount: Medium (34-66%) Fat Layer (Subcutaneous Tissue) Exposed: Yes Necrotic Quality: Adherent Slough Tendon Exposed: No Muscle Exposed: No Joint Exposed: No Bone Exposed: No Periwound Skin Texture Texture Color No Abnormalities Noted:  Yes No Abnormalities Noted: Yes Moisture Temperature / Pain No Abnormalities Noted: Yes Temperature: No Abnormality Tenderness on Palpation: Yes [name] Notes Wound #1 (Lower Leg) Wound Laterality: Left, Medial Cleanser Soap and Water Discharge Instruction: May shower and wash wound with dial antibacterial soap and water prior  to dressing change. Wound Cleanser Discharge Instruction: Cleanse the wound with wound cleanser prior to applying a clean dressing using gauze sponges, not tissue or cotton balls. Peri-Wound Care Triamcinolone 15 (g) Discharge Instruction: Use triamcinolone 15 (g) as directed Sween Lotion (Moisturizing lotion) Discharge Instruction: Apply moisturizing lotion as directed Topical Gentamicin Discharge Instruction: As directed by physician Mupirocin Ointment Discharge Instruction: Apply Mupirocin (Bactroban) as instructed Skintegrity Hydrogel 4 (oz) Discharge Instruction: Apply hydrogel as directed Primary Dressing Promogran Prisma Matrix, 4.34 (sq in) (silver collagen) Discharge Instruction: Moisten collagen with saline or hydrogel Secondary Dressing Optifoam Non-Adhesive Dressing, 4x4 in Discharge Instruction: Apply over primary dressing as directed. Secured With Compression Wrap Kerlix Roll 4.5x3.1 (in/yd) Discharge Instruction: Apply Kerlix and Coban compression as directed. Coban Self-Adherent Wrap 4x5 (in/yd) Discharge Instruction: Apply over Kerlix as directed. Compression Stockings Erin [name], Erin [name] ([id number]) [id number] Page 8 of 8 Add-Ons Electronic Signature(s) Signed: 06/02/2023 5:13:28 PM By: Zenaida Deed RN, BSN Entered By: Zenaida Deed on 06/02/2023 14:08:07 -------------------------------------------------------------------------------- Vitals Details Patient Name: Date of Service: [name], [name] 06/02/2023 2:00 PM Medical Record Number: [id number] Patient Account Number: [id number] Date of  Birth/Sex: Treating RN: [birth year] (82 y.o. F) Primary Care Terrelle Ruffolo: Erin [name] Other Clinician: Referring Allora Bains: Treating Adreona Brand/Extender: Erin [name], Erin [name]: 25 Vital Signs Time Taken: 01:54 Temperature (F): 97.9 Height (in): 60 Pulse (bpm): 64 Weight (lbs): 138 Respiratory Rate (breaths/min): 18 Body Mass Index (BMI): 26.9 Blood Pressure (mmHg): 186/80 Reference Range: 80 - 120 mg / dl Electronic Signature(s) Signed: 06/02/2023 5:13:28 PM By: Zenaida Deed RN, BSN Entered By: Zenaida Deed on 06/02/2023 13:57:39

## 2023-06-09 ENCOUNTER — Encounter (HOSPITAL_BASED_OUTPATIENT_CLINIC_OR_DEPARTMENT_OTHER): Payer: Medicare HMO | Attending: Internal Medicine | Admitting: Internal Medicine

## 2023-06-09 DIAGNOSIS — M1990 Unspecified osteoarthritis, unspecified site: Secondary | ICD-10-CM | POA: Diagnosis not present

## 2023-06-09 DIAGNOSIS — L97822 Non-pressure chronic ulcer of other part of left lower leg with fat layer exposed: Secondary | ICD-10-CM | POA: Insufficient documentation

## 2023-06-09 DIAGNOSIS — I10 Essential (primary) hypertension: Secondary | ICD-10-CM | POA: Diagnosis not present

## 2023-06-09 DIAGNOSIS — M109 Gout, unspecified: Secondary | ICD-10-CM | POA: Diagnosis not present

## 2023-06-09 DIAGNOSIS — I872 Venous insufficiency (chronic) (peripheral): Secondary | ICD-10-CM | POA: Diagnosis not present

## 2023-06-09 NOTE — Progress Notes (Signed)
Erin [name], Erin [name] ([id number]) [id number] Page 1 of 5 Visit Report for 06/09/2023 Arrival Information Details Patient Name: Date of Service: [name], [name] RBA RA N. 06/09/2023 3:15 PM Medical Record Number: [id number] Patient Account Number: [id number] Date of Birth/Sex: Treating RN: [birth year] (82 y.o. F) Primary Care Aryiana Klinkner: [name] Other Clinician: Referring Jeaninne Lodico: Treating Ruhani Umland/Extender: Grayland Jack Weeks in Treatment: 27 Visit Information History Since Last Visit Added or deleted any medications: No Patient Arrived: Ambulatory Any new allergies or adverse reactions: No Arrival Time: 15:51 Had a fall or experienced change in No Accompanied By: daughter activities of daily living that may affect Transfer Assistance: None risk of falls: Patient Identification Verified: Yes Signs or symptoms of abuse/neglect since last visito No Secondary Verification Process Completed: Yes Hospitalized since last visit: No Patient Requires Transmission-Based Precautions: No Implantable device outside of the clinic excluding No Patient Has Alerts: No cellular tissue based products placed in the center since last visit: Has Dressing in Place as Prescribed: Yes Has Compression in Place as Prescribed: Yes Pain Present Now: No Electronic Signature(s) Signed: 06/09/2023 4:23:48 PM By: Thayer Dallas Entered By: Thayer Dallas on 06/09/2023 15:53:06 -------------------------------------------------------------------------------- Clinic Level of Care Assessment Details Patient Name: Date of Service: [name], [name] 06/09/2023 3:15 PM Medical Record Number: [id number] Patient Account Number: [id number] Date of Birth/Sex: Treating RN: 1941-06-03 (82 y.o. F) Primary Care Valta Dillon: [name] Other Clinician: Thayer Dallas Referring Tyiesha Brackney: Treating Chance Karam/Extender: Grayland Jack Weeks in  Treatment: 27 Clinic Level of Care Assessment Items TOOL 4 Quantity Score X- 1 0 Use when only an EandM is performed on FOLLOW-UP visit ASSESSMENTS - Nursing Assessment / Reassessment X- 1 10 Reassessment of Co-morbidities (includes updates in patient status) X- 1 5 Reassessment of Adherence to Treatment Plan ASSESSMENTS - Wound and Skin A ssessment / Reassessment X - Simple Wound Assessment / Reassessment - one wound 1 5 []  - 0 Complex Wound Assessment / Reassessment - multiple wounds []  - 0 Dermatologic / Skin Assessment (not related to wound area) ASSESSMENTS - Focused Assessment []  - 0 Circumferential Edema Measurements - multi extremities []  - 0 Nutritional Assessment / Counseling / Intervention MIKEA, SANCLEMENTE ([id number]) [phone].pdf Page 2 of 5 []  - 0 Lower Extremity Assessment (monofilament, tuning fork, pulses) []  - 0 Peripheral Arterial Disease Assessment (using hand held doppler) ASSESSMENTS - Ostomy and/or Continence Assessment and Care []  - 0 Incontinence Assessment and Management []  - 0 Ostomy Care Assessment and Management (repouching, etc.) PROCESS - Coordination of Care X - Simple Patient / Family Education for ongoing care 1 15 []  - 0 Complex (extensive) Patient / Family Education for ongoing care []  - 0 Staff obtains Chiropractor, Records, T Results / Process Orders est []  - 0 Staff telephones HHA, Nursing Homes / Clarify orders / etc []  - 0 Routine Transfer to another Facility (non-emergent condition) []  - 0 Routine Hospital Admission (non-emergent condition) []  - 0 New Admissions / Manufacturing engineer / Ordering NPWT Apligraf, etc. , []  - 0 Emergency Hospital Admission (emergent condition) X- 1 10 Simple Discharge Coordination []  - 0 Complex (extensive) Discharge Coordination PROCESS - Special Needs []  - 0 Pediatric / Minor Patient Management []  - 0 Isolation Patient Management []  - 0 Hearing / Language /  Visual special needs []  - 0 Assessment of Community assistance (transportation, D/C planning, etc.) []  - 0 Additional assistance / Altered mentation []  - 0 Support Surface(s) Assessment (bed, cushion,  seat, etc.) INTERVENTIONS - Wound Cleansing / Measurement X - Simple Wound Cleansing - one wound 1 5 []  - 0 Complex Wound Cleansing - multiple wounds []  - 0 Wound Imaging (photographs - any number of wounds) []  - 0 Wound Tracing (instead of photographs) []  - 0 Simple Wound Measurement - one wound []  - 0 Complex Wound Measurement - multiple wounds INTERVENTIONS - Wound Dressings X - Small Wound Dressing one or multiple wounds 1 10 []  - 0 Medium Wound Dressing one or multiple wounds []  - 0 Large Wound Dressing one or multiple wounds []  - 0 Application of Medications - topical []  - 0 Application of Medications - injection INTERVENTIONS - Miscellaneous []  - 0 External ear exam []  - 0 Specimen Collection (cultures, biopsies, blood, body fluids, etc.) []  - 0 Specimen(s) / Culture(s) sent or taken to Lab for analysis []  - 0 Patient Transfer (multiple staff / Nurse, adult / Similar devices) []  - 0 Simple Staple / Suture removal (25 or less) []  - 0 Complex Staple / Suture removal (26 or more) []  - 0 Hypo / Hyperglycemic Management (close monitor of Blood Glucose) AURIANNA, CAMUSO ([id number]) [phone].pdf Page 3 of 5 []  - 0 Ankle / Brachial Index (ABI) - do not check if billed separately []  - 0 Vital Signs Has the patient been seen at the hospital within the last three years: Yes Total Score: 60 Level Of Care: New/Established - Level 2 Electronic Signature(s) Signed: 06/09/2023 4:23:48 PM By: Thayer Dallas Entered By: Thayer Dallas on 06/09/2023 15:55:59 -------------------------------------------------------------------------------- Encounter Discharge Information Details Patient Name: Date of Service: [name], [name] 06/09/2023 3:15  PM Medical Record Number: [id number] Patient Account Number: [id number] Date of Birth/Sex: Treating RN: 1941/03/25 (82 y.o. F) Primary Care Chevette Fee: [name] Other Clinician: Thayer Dallas Referring Corgan Mormile: Treating Emalyn Schou/Extender: Grayland Jack Weeks in Treatment: 2 Encounter Discharge Information Items Discharge Condition: Stable Ambulatory Status: Ambulatory Discharge Destination: Home Transportation: Private Auto Accompanied By: daughter Schedule Follow-up Appointment: Yes Clinical Summary of Care: Electronic Signature(s) Signed: 06/09/2023 4:23:48 PM By: Thayer Dallas Entered By: Thayer Dallas on 06/09/2023 15:57:06 -------------------------------------------------------------------------------- Patient/Caregiver Education Details Patient Name: Date of Service: [name], [name] 12/3/2024andnbsp3:15 PM Medical Record Number: [id number] Patient Account Number: [id number] Date of Birth/Gender: Treating RN: 11/02/1940 (82 y.o. F) Primary Care Physician: [name] Other Clinician: Thayer Dallas Referring Physician: Treating Physician/Extender: Melanie Crazier in Treatment: 48 Education Assessment Education Provided To: Patient Education Topics Provided Electronic Signature(s) Signed: 06/09/2023 4:23:48 PM By: Thayer Dallas Entered By: Thayer Dallas on 06/09/2023 15:56:41 [name] ([id number]) [id number] Page 4 of 5 -------------------------------------------------------------------------------- Wound Assessment Details Patient Name: Date of Service: [name], [name] RBA RA N. 06/09/2023 3:15 PM Medical Record Number: [id number] Patient Account Number: [id number] Date of Birth/Sex: Treating RN: [birth year] (82 y.o. F) Primary Care Erin [name]: [name] Other Clinician: Referring Ertha Nabor: Treating Ladajah Soltys/Extender: Dollene Primrose, Serafina Royals Weeks in Treatment: 27 Wound Status Wound Number: 1 Primary Etiology: Venous Leg Ulcer Wound Location: Left, Medial Lower Leg Wound Status: Open Wounding Event: Gradually Appeared Date Acquired: 08/29/2022 Weeks Of Treatment: 27 Clustered Wound: No Wound Measurements Length: (cm) 0.8 Width: (cm) 0.7 Depth: (cm) 0.1 Area: (cm) 0.44 Volume: (cm) 0.044 % Reduction in Area: 92.5% % Reduction in Volume: 92.5% Wound Description Classification: Full Thickness Without Exposed Support Exudate Amount: Medium Exudate Type: Serosanguineous Exudate Color: red, brown Structures Periwound Skin Texture  Texture Color No Abnormalities Noted: No No Abnormalities Noted: No Moisture No Abnormalities Noted: No Treatment Notes Wound #1 (Lower Leg) Wound Laterality: Left, Medial Cleanser Soap and Water Discharge Instruction: May shower and wash wound with dial antibacterial soap and water prior to dressing change. Wound Cleanser Discharge Instruction: Cleanse the wound with wound cleanser prior to applying a clean dressing using gauze sponges, not tissue or cotton balls. Peri-Wound Care Triamcinolone 15 (g) Discharge Instruction: Use triamcinolone 15 (g) as directed Sween Lotion (Moisturizing lotion) Discharge Instruction: Apply moisturizing lotion as directed Topical Gentamicin Discharge Instruction: As directed by physician Mupirocin Ointment Discharge Instruction: Apply Mupirocin (Bactroban) as instructed Skintegrity Hydrogel 4 (oz) Discharge Instruction: Apply hydrogel as directed Primary Dressing Promogran Prisma Matrix, 4.34 (sq in) (silver collagen) Discharge Instruction: Moisten collagen with saline or hydrogel Secondary Dressing ANNITTA, GILLAN ([id number]) [phone].pdf Page 5 of 5 Optifoam Non-Adhesive Dressing, 4x4 in Discharge Instruction: Apply over primary dressing as directed. Secured With Compression Wrap Kerlix Roll 4.5x3.1  (in/yd) Discharge Instruction: Apply Kerlix and Coban compression as directed. Coban Self-Adherent Wrap 4x5 (in/yd) Discharge Instruction: Apply over Kerlix as directed. Compression Stockings Add-Ons Electronic Signature(s) Signed: 06/09/2023 4:23:48 PM By: Thayer Dallas Entered By: Thayer Dallas on 06/09/2023 15:54:14 -------------------------------------------------------------------------------- Vitals Details Patient Name: Date of Service: [name], [name] 06/09/2023 3:15 PM Medical Record Number: [id number] Patient Account Number: [id number] Date of Birth/Sex: Treating RN: 12-05-1940 (82 y.o. F) Primary Care Wilmer Berryhill: Chilton Greathouse R Other Clinician: Referring Santosha Jividen: Treating Niquan Charnley/Extender: Dollene Primrose, Serafina Royals Weeks in Treatment: 27 Vital Signs Time Taken: 15:53 Reference Range: 80 - 120 mg / dl Height (in): 60 Weight (lbs): 138 Body Mass Index (BMI): 26.9 Electronic Signature(s) Signed: 06/09/2023 4:23:48 PM By: Thayer Dallas Entered By: Thayer Dallas on 06/09/2023 15:53:35

## 2023-06-14 NOTE — Progress Notes (Signed)
KENEDY, BINDA ([id number]) [id number] Page 1 of 1 Visit Report for 06/09/2023 SuperBill Details Patient Name: Date of Service: [name], [name] RBA RA N. 06/09/2023 Medical Record Number: [id number] Patient Account Number: [id number] Date of Birth/Sex: Treating RN: [birth year] (82 y.o. 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

## 2023-06-16 ENCOUNTER — Encounter (HOSPITAL_BASED_OUTPATIENT_CLINIC_OR_DEPARTMENT_OTHER): Payer: Medicare HMO | Admitting: Internal Medicine

## 2023-06-16 DIAGNOSIS — I872 Venous insufficiency (chronic) (peripheral): Secondary | ICD-10-CM | POA: Diagnosis not present

## 2023-06-16 DIAGNOSIS — L97822 Non-pressure chronic ulcer of other part of left lower leg with fat layer exposed: Secondary | ICD-10-CM | POA: Diagnosis not present

## 2023-06-16 DIAGNOSIS — M1990 Unspecified osteoarthritis, unspecified site: Secondary | ICD-10-CM | POA: Diagnosis not present

## 2023-06-16 DIAGNOSIS — M109 Gout, unspecified: Secondary | ICD-10-CM | POA: Diagnosis not present

## 2023-06-16 DIAGNOSIS — I10 Essential (primary) hypertension: Secondary | ICD-10-CM | POA: Diagnosis not present

## 2023-06-16 NOTE — Progress Notes (Signed)
Erin [name], Erin [name] ([id number]) [id number] Page 1 of 5 Visit Report for 06/16/2023 Arrival Information Details Patient Name: Date of Service: Erin [name], [name] [name]. 06/16/2023 3:15 PM Medical Record Number: [id number] Patient Account Number: [id number] Date of Birth/Sex: Treating RN: [birth year] (82 y.o. F) Primary Care Georgene Kopper: [name] Other Clinician: Thayer Dallas Referring Nyhla Mountjoy: Treating Izik Bingman/Extender: Grayland Jack Weeks in Treatment: 28 Visit Information History Since Last Visit All ordered tests and consults were completed: Erin Patient Arrived: Ambulatory Added or deleted any medications: Erin Arrival Time: 15:30 Any new allergies or adverse reactions: Erin Accompanied By: daughter Had a fall or experienced change in Erin Transfer Assistance: None activities of daily living that may affect Patient Identification Verified: Yes risk of falls: Secondary Verification Process Completed: Yes Signs or symptoms of abuse/neglect since last visito Erin Patient Requires Transmission-Based Precautions: Erin Hospitalized since last visit: Erin Patient Has Alerts: Erin Has Dressing in Place as Prescribed: Yes Has Compression in Place as Prescribed: Yes Pain Present Now: Erin Electronic Signature(s) Signed: 06/16/2023 4:21:45 PM By: Thayer Dallas Entered By: Thayer Dallas on 06/16/2023 16:19:05 -------------------------------------------------------------------------------- Clinic Level of Care Assessment Details Patient Name: Date of Service: Erin [name], [name]. 06/16/2023 3:15 PM Medical Record Number: [id number] Patient Account Number: [id number] Date of Birth/Sex: Treating RN: 16-May-1941 (82 y.o. F) Primary Care Gio Janoski: [name] Other Clinician: Thayer Dallas Referring Detra Bores: Treating Yarah Fuente/Extender: Grayland Jack Weeks in Treatment: 28 Clinic Level of Care Assessment Items TOOL 4  Quantity Score X- 1 0 Use when only an EandM is performed on FOLLOW-UP visit ASSESSMENTS - Nursing Assessment / Reassessment X- 1 10 Reassessment of Co-morbidities (includes updates in patient status) X- 1 5 Reassessment of Adherence to Treatment Plan ASSESSMENTS - Wound and Skin A ssessment / Reassessment X - Simple Wound Assessment / Reassessment - one wound 1 5 []  - 0 Complex Wound Assessment / Reassessment - multiple wounds []  - 0 Dermatologic / Skin Assessment (not related to wound area) ASSESSMENTS - Focused Assessment []  - 0 Circumferential Edema Measurements - multi extremities []  - 0 Nutritional Assessment / Counseling / Intervention []  - 0 Lower Extremity Assessment (monofilament, tuning fork, pulses) Erin [name], Erin [name] ([id number]) [id number] Page 2 of 5 []  - 0 Peripheral Arterial Disease Assessment (using hand held doppler) ASSESSMENTS - Ostomy and/or Continence Assessment and Care []  - 0 Incontinence Assessment and Management []  - 0 Ostomy Care Assessment and Management (repouching, etc.) PROCESS - Coordination of Care X - Simple Patient / Family Education for ongoing care 1 15 []  - 0 Complex (extensive) Patient / Family Education for ongoing care []  - 0 Staff obtains Chiropractor, Records, T Results / Process Orders est []  - 0 Staff telephones HHA, Nursing Homes / Clarify orders / etc []  - 0 Routine Transfer to another Facility (non-emergent condition) []  - 0 Routine Hospital Admission (non-emergent condition) []  - 0 New Admissions / Manufacturing engineer / Ordering NPWT Apligraf, etc. , []  - 0 Emergency Hospital Admission (emergent condition) X- 1 10 Simple Discharge Coordination []  - 0 Complex (extensive) Discharge Coordination PROCESS - Special Needs []  - 0 Pediatric / Minor Patient Management []  - 0 Isolation Patient Management []  - 0 Hearing / Language / Visual special needs []  - 0 Assessment of Community assistance  (transportation, D/C planning, etc.) []  - 0 Additional assistance / Altered mentation []  - 0 Support Surface(s) Assessment (bed, cushion, seat, etc.) INTERVENTIONS - Wound Cleansing / Measurement X -  Simple Wound Cleansing - one wound 1 5 []  - 0 Complex Wound Cleansing - multiple wounds []  - 0 Wound Imaging (photographs - any number of wounds) []  - 0 Wound Tracing (instead of photographs) []  - 0 Simple Wound Measurement - one wound []  - 0 Complex Wound Measurement - multiple wounds INTERVENTIONS - Wound Dressings X - Small Wound Dressing one or multiple wounds 1 10 []  - 0 Medium Wound Dressing one or multiple wounds []  - 0 Large Wound Dressing one or multiple wounds X- 1 5 Application of Medications - topical []  - 0 Application of Medications - injection INTERVENTIONS - Miscellaneous []  - 0 External ear exam []  - 0 Specimen Collection (cultures, biopsies, blood, body fluids, etc.) []  - 0 Specimen(s) / Culture(s) sent or taken to Lab for analysis []  - 0 Patient Transfer (multiple staff / Nurse, adult / Similar devices) []  - 0 Simple Staple / Suture removal (25 or less) []  - 0 Complex Staple / Suture removal (26 or more) []  - 0 Hypo / Hyperglycemic Management (close monitor of Blood Glucose) []  - 0 Ankle / Brachial Index (ABI) - do not check if billed separately Erin [name], Erin [name] ([id number]) [id number] Page 3 of 5 []  - 0 Vital Signs Has the patient been seen at the hospital within the last three years: Yes Total Score: 65 Level Of Care: New/Established - Level 2 Electronic Signature(s) Signed: 06/16/2023 4:21:45 PM By: Thayer Dallas Entered By: Thayer Dallas on 06/16/2023 16:20:08 -------------------------------------------------------------------------------- Encounter Discharge Information Details Patient Name: Date of Service: Erin [name], [name]. 06/16/2023 3:15 PM Medical Record Number: [id number] Patient Account Number:  [id number] Date of Birth/Sex: Treating RN: 10-Jul-1940 (82 y.o. F) Primary Care Kayren Holck: [name] Other Clinician: Thayer Dallas Referring Makynlie Rossini: Treating Lashaunda Schild/Extender: Grayland Jack Weeks in Treatment: 33 Encounter Discharge Information Items Discharge Condition: Stable Ambulatory Status: Ambulatory Discharge Destination: Home Transportation: Private Auto Accompanied By: daughter Schedule Follow-up Appointment: Yes Clinical Summary of Care: Electronic Signature(s) Signed: 06/16/2023 4:21:45 PM By: Thayer Dallas Entered By: Thayer Dallas on 06/16/2023 16:20:48 -------------------------------------------------------------------------------- Patient/Caregiver Education Details Patient Name: Date of Service: Erin [name], [name]. 12/10/2024andnbsp3:15 PM Medical Record Number: [id number] Patient Account Number: [id number] Date of Birth/Gender: Treating RN: 06-15-1941 (82 y.o. F) Primary Care Physician: [name] Other Clinician: Thayer Dallas Referring Physician: Treating Physician/Extender: Melanie Crazier in Treatment: 19 Education Assessment Education Provided To: Patient Education Topics Provided Electronic Signature(s) Signed: 06/16/2023 4:21:45 PM By: Thayer Dallas Entered By: Thayer Dallas on 06/16/2023 16:20:29 [name] ([id number]) [id number] Page 4 of 5 -------------------------------------------------------------------------------- Wound Assessment Details Patient Name: Date of Service: Erin [name], [name] [name]. 06/16/2023 3:15 PM Medical Record Number: [id number] Patient Account Number: [id number] Date of Birth/Sex: Treating RN: [birth year] (82 y.o. F) Primary Care Crockett Rallo: [name] Other Clinician: Referring Sheretha Shadd: Treating Truth Barot/Extender: Dollene Primrose, Serafina Royals Weeks in Treatment: 28 Wound Status Wound Number: 1 Primary  Etiology: Venous Leg Ulcer Wound Location: Left, Medial Lower Leg Wound Status: Open Wounding Event: Gradually Appeared Date Acquired: 08/29/2022 Weeks Of Treatment: 28 Clustered Wound: Erin Wound Measurements Length: (cm) 0.8 Width: (cm) 0.7 Depth: (cm) 0.1 Area: (cm) 0.44 Volume: (cm) 0.044 % Reduction in Area: 92.5% % Reduction in Volume: 92.5% Wound Description Classification: Full Thickness Without Exposed Support Exudate Amount: Medium Exudate Type: Serosanguineous Exudate Color: red, brown Structures Periwound Skin Texture Texture Color Erin Abnormalities Noted: Erin Erin Abnormalities Noted: Erin  Moisture Erin Abnormalities Noted: Erin Treatment Notes Wound #1 (Lower Leg) Wound Laterality: Left, Medial Cleanser Soap and Water Discharge Instruction: May shower and wash wound with dial antibacterial soap and water prior to dressing change. Wound Cleanser Discharge Instruction: Cleanse the wound with wound cleanser prior to applying a clean dressing using gauze sponges, not tissue or cotton balls. Peri-Wound Care Triamcinolone 15 (g) Discharge Instruction: Use triamcinolone 15 (g) as directed Sween Lotion (Moisturizing lotion) Discharge Instruction: Apply moisturizing lotion as directed Topical Gentamicin Discharge Instruction: As directed by physician Mupirocin Ointment Discharge Instruction: Apply Mupirocin (Bactroban) as instructed Skintegrity Hydrogel 4 (oz) Discharge Instruction: Apply hydrogel as directed Primary Dressing Promogran Prisma Matrix, 4.34 (sq in) (silver collagen) Discharge Instruction: Moisten collagen with saline or hydrogel Secondary Dressing Erin [name], Erin [name] ([id number]) [id number] Page 5 of 5 Optifoam Non-Adhesive Dressing, 4x4 in Discharge Instruction: Apply over primary dressing as directed. Secured With Compression Wrap Kerlix Roll 4.5x3.1 (in/yd) Discharge Instruction: Apply Kerlix and Coban compression as  directed. Coban Self-Adherent Wrap 4x5 (in/yd) Discharge Instruction: Apply over Kerlix as directed. Compression Stockings Add-Ons Electronic Signature(s) Signed: 06/16/2023 4:21:45 PM By: Thayer Dallas Entered By: Thayer Dallas on 06/16/2023 16:19:19 -------------------------------------------------------------------------------- Vitals Details Patient Name: Date of Service: Erin [name], [name]. 06/16/2023 3:15 PM Medical Record Number: [id number] Patient Account Number: [id number] Date of Birth/Sex: Treating RN: 1941-01-09 (82 y.o. F) Primary Care Christien Berthelot: [name] Other Clinician: Referring Jaquel Glassburn: Treating Garfield Coiner/Extender: Dollene Primrose, Serafina Royals Weeks in Treatment: 28 Vital Signs Time Taken: 15:40 Reference Range: 80 - 120 mg / dl Height (in): 60 Weight (lbs): 138 Body Mass Index (BMI): 26.9 Electronic Signature(s) Signed: 06/16/2023 4:21:45 PM By: Thayer Dallas Entered By: Thayer Dallas on 06/16/2023 16:19:11

## 2023-06-17 NOTE — Progress Notes (Signed)
Erin [name], Erin [name] ([id number]) [id number] Page 1 of 1 Visit Report for 06/16/2023 SuperBill Details Patient Name: Date of Service: [name], [name] RBA RA N. 06/16/2023 Medical Record Number: [id number] Patient Account Number: [id number] Date of Birth/Sex: Treating RN: [birth year] (82 y.o. 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

## 2023-06-23 ENCOUNTER — Encounter (HOSPITAL_BASED_OUTPATIENT_CLINIC_OR_DEPARTMENT_OTHER): Payer: Medicare HMO | Admitting: General Surgery

## 2023-06-23 DIAGNOSIS — I10 Essential (primary) hypertension: Secondary | ICD-10-CM | POA: Diagnosis not present

## 2023-06-23 DIAGNOSIS — M1990 Unspecified osteoarthritis, unspecified site: Secondary | ICD-10-CM | POA: Diagnosis not present

## 2023-06-23 DIAGNOSIS — I87332 Chronic venous hypertension (idiopathic) with ulcer and inflammation of left lower extremity: Secondary | ICD-10-CM | POA: Diagnosis not present

## 2023-06-23 DIAGNOSIS — I872 Venous insufficiency (chronic) (peripheral): Secondary | ICD-10-CM | POA: Diagnosis not present

## 2023-06-23 DIAGNOSIS — M109 Gout, unspecified: Secondary | ICD-10-CM | POA: Diagnosis not present

## 2023-06-23 DIAGNOSIS — L97822 Non-pressure chronic ulcer of other part of left lower leg with fat layer exposed: Secondary | ICD-10-CM | POA: Diagnosis not present

## 2023-06-24 NOTE — Progress Notes (Signed)
KILEY, FOWLER ([id number]) [id number] Page 1 of 9 Visit Report for 06/23/2023 Chief Complaint Document Details Patient Name: Date of Service: NO RMA [name], [name] [name]. 06/23/2023 2:00 PM Medical Record Number: [id number] Patient Account Number: [id number] Date of Birth/Sex: Treating RN: Oct 27, 1940 (82 y.o. F) Primary Care Provider: [name] Other Clinician: Referring Provider: Treating Provider/Extender: [name] Weeks in Treatment: 44 Information Obtained from: Patient Chief Complaint Patient presents for treatment of an open ulcer due to venous insufficiency Electronic Signature(s) Signed: 06/23/2023 2:50:12 PM By: Duanne Guess MD FACS Entered By: Duanne Guess on 06/23/2023 14:46:16 -------------------------------------------------------------------------------- Debridement Details Patient Name: Date of Service: NO RMA [name], [name]. 06/23/2023 2:00 PM Medical Record Number: [id number] Patient Account Number: [id number] Date of Birth/Sex: Treating RN: 1940-11-21 (82 y.o. [name], [name] Primary Care Provider: [name] Other Clinician: Referring Provider: Treating Provider/Extender: [name] Weeks in Treatment: 29 Debridement Performed for Assessment: Wound #1 Left,Medial Lower Leg Performed By: Physician Duanne Guess, MD The following information was scribed by: Zenaida Deed The information was scribed for: Duanne Guess Debridement Type: Debridement Severity of Tissue Pre Debridement: Fat layer exposed Level of Consciousness (Pre-procedure): Awake and Alert Pre-procedure Verification/Time Out Yes - 14:40 Taken: Start Time: 14:40 Pain Control: Lidocaine 4% T opical Solution Percent of Wound Bed Debrided: 100% T Area Debrided (cm): otal 0.33 Tissue and other material debrided: Viable, Non-Viable, Slough, Subcutaneous, Slough Level: Skin/Subcutaneous  Tissue Debridement Description: Excisional Instrument: Curette Bleeding: Minimum Hemostasis Achieved: Pressure Procedural Pain: 2 Post Procedural Pain: 1 Response to Treatment: Procedure was tolerated well Level of Consciousness (Post- Awake and Alert procedure): Post Debridement Measurements of Total Wound Length: (cm) 0.7 Width: (cm) 0.6 Depth: (cm) 0.1 Volume: (cm) 0.033 Erin [name], Erin [name] ([id number]) [id number] Page 2 of 9 Character of Wound/Ulcer Post Debridement: Improved Severity of Tissue Post Debridement: Fat layer exposed Post Procedure Diagnosis Same as Pre-procedure Electronic Signature(s) Signed: 06/23/2023 2:50:12 PM By: Duanne Guess MD FACS Signed: 06/23/2023 5:11:40 PM By: Zenaida Deed RN, BSN Entered By: Zenaida Deed on 06/23/2023 14:42:46 -------------------------------------------------------------------------------- HPI Details Patient Name: Date of Service: NO RMA [name], [name]. 06/23/2023 2:00 PM Medical Record Number: [id number] Patient Account Number: [id number] Date of Birth/Sex: Treating RN: March 24, 1941 (82 y.o. F) Primary Care Provider: [name] Other Clinician: Referring Provider: Treating Provider/Extender: [name] Weeks in Treatment: 64 History of Present Illness HPI Description: ADMISSION 11/26/2022 This is an 82 year old woman who was referred by her primary care provider for a persistent nonhealing wound on her left lower extremity. It has been present since February of this year. 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 I do not see any formal venous reflux studies in the electronic medical record. She is not diabetic. She does not smoke. Her PCP has been washing her leg each week and applying a Kerlix and Coban wrap, but has not performed any formal debridement and has not  been using any sort of topical contact layer or ointment. ABI in clinic was noncompressible, but she has a palpable pedal pulse. 12/10/2022: The wound is a bit smaller and cleaner today. There is still fairly heavy slough on the surface. Edema control is acceptable. 12/16/2022: The wound is measuring the slightest bit smaller today. There is still thick slough on the surface. Edema control is good. 12/23/2022: The wound is a little  bit smaller again today. Still with fairly heavy slough accumulation. Edema control is good. She has a new small wound on the proximal portion of the same leg. It looks as though she may have developed a small blister over the weekend subsequently ruptured. There is minimal slough on the surface. 12/30/2022: The wound is about the same size, but substantially cleaner. There is some hypertrophic granulation tissue starting to emerge. The wound that was new last week has closed but she has a different new wound today on the lateral aspect of her left lower leg. It also appears to have been a blister that opened and ruptured. There is a little bit of slough on the surface. 01/06/2023: She has new wounds today. She has a wound on the PIP knuckle of her left third and fourth toes, as well as an anterior tibial wound. She says that the wrap was too tight and it also looks like when she pushes her foot into her shoe, it shifts the wrapping back to her midfoot causing bunching up of the wrap material and pain. The lateral leg wounds are both smaller with a little slough and eschar present. The original medial leg wound is smaller and more superficial. There is slough on the surface. 01/13/2023: She has new wounds today. The anterior tibial wound has a satellite lesion that has opened up adjacent to it. Has additional wounds on her fifth toe in addition to the existing ones on her third and fourth toe. The lateral leg wounds seem to have healed. The original medial leg wound is also smaller,  but she and her daughter have several questions regarding why we are not making forward progress, all of which are very reasonable. 01/20/2023: The wounds on her toes have healed. There is a tiny wound on the dorsum of her foot with a little bit of slough on it. The anterolateral leg wound has epithelialized quite substantially and the satellite that had opened last week is now closed. The medial leg wound measured narrower today. It has hypertrophic granulation tissue and slough present. 01/27/2023: The small wounds have all healed. The medial leg wound is smaller as well, with some slough on the surface but without reaccumulation of the hypertrophic granulation tissue. 02/03/2023: The only remaining open wound is the left medial leg wound. It is smaller and a bit more superficial. There is still slough accumulation on the surface. No hypertrophic granulation tissue. She had both her venous reflux and lower extremity arterial studies, both of which were essentially normal, although her TBI's are slightly diminished. 02/10/2023: The left medial leg wound has some granulation tissue filling in, but overall, it is still fairly fibrotic. There is some slough on the surface today. 02/18/2023: The moisture balance and surface consistency have both improved. There is some slough over granulation tissue. Edema control is good. 02/24/2023: The wound measured smaller today. Moisture balance is good. Slough has reaccumulated. Edema remains well-controlled. 03/03/2023: The wound is a little bit smaller today. Moisture balance is stable. Minimal slough accumulation. There is more granulation tissue filling in over the fibrotic surface. Edema is well-controlled. 03/10/2023: The wound is smaller again today. Moisture balance is good. Light slough accumulation on the fibrotic surface. Edema is well-controlled. 03/17/2023: The wound is slightly smaller again today. There is minimal slough accumulation but the underlying  surface remains quite fibrotic. Edema control is good. Erin [name], Erin [name] ([id number]) [id number] Page 3 of 9 03/24/2023: Although the wound measurements are the same today, visually it looks  smaller. There is still a fairly fibrotic surface underneath and slough. Edema remains well-controlled. 03/31/2023: She has a new wound that was identified when her wraps and dressings were removed today. It is just to the midline of the anterior tibial surface. The fat layer is exposed. There is a little bit of eschar and dry skin along with some thin slough. The wound closer to her ankle measured smaller today. The surface continues to improve although portions of it still remain fibrotic. There is slough buildup present. 04/07/2023: The wound that was new last week has nearly closed, but remains open by perhaps about a millimeter. The wound closer to her ankle had no significant change based on measurements, but visually it looks smaller to me. It is a little bit dry today and the surface is still fibrotic. 04/14/2023: The more proximal wound has completely healed. The wound at her ankle is quite a bit smaller with substantial epithelialization. There is just a small open portion of the distal aspect. It is covered with slough and eschar. 04/21/2023: The ankle wound is just the slightest bit smaller but it is shallower. There is slough and eschar accumulation. She has a new wound on her left posteromedial calf. It looks as though there was some friction or pinching, potentially from the Ace bandage. There is a bit of slough on the surface as well as some old blood. 04/28/2023: The ankle wound did not change in size this week. There is a little slough on the surface. The wound on her left posteromedial calf is covered with a layer of eschar. Underneath, it is smaller and clean. Edema control is good. 05/05/2023: The ankle wound has contracted quite a bit this week. It is extremely  superficial with just a little thin eschar around the edges. The left posteromedial calf wound is healed. She had a small pustule just distal to this that was marked as a new wound, but I am not sure that the epidermis is actually violated here. 05/12/2023: The tiny calf wound that was new last week has healed. The ankle wound is smaller again today and very superficial, but has not yet completely closed. Edema control is good. 05/19/2023: Unfortunately, the ankle wound enlarged over the past week. No malodor or purulent drainage to suggest infection as the etiology and her edema control remains good. 05/26/2023: The ankle wound has contracted since her last visit. The surface has a bit of slough on it. Edema control is good. 06/02/2023: No significant change to the wound this week. 06/23/2023: The wound measured smaller this week. The surface is a little bit less fibrotic and there are more emerging buds of granulation tissue. Minimal slough. Electronic Signature(s) Signed: 06/23/2023 2:50:12 PM By: Duanne Guess MD FACS Entered By: Duanne Guess on 06/23/2023 14:46:54 -------------------------------------------------------------------------------- Physical Exam Details Patient Name: Date of Service: NO RMA [name], [name]. 06/23/2023 2:00 PM Medical Record Number: [id number] Patient Account Number: [id number] Date of Birth/Sex: Treating RN: [birth year] (82 y.o. F) Primary Care Provider: [name] Other Clinician: Referring Provider: Treating Provider/Extender: Erin [name], Erin [name] Weeks in Treatment: 23 Constitutional Hypertensive, asymptomatic. . . . no acute distress. Respiratory Normal work of breathing on room air.. Notes 06/23/2023: The wound measured smaller this week. The surface is a little bit less fibrotic and there are more emerging buds of granulation tissue. Minimal slough. Electronic Signature(s) Signed: 06/23/2023 2:50:12 PM By: Duanne Guess MD FACS Entered By: Duanne Guess on 06/23/2023 14:47:27 [place], Ivor Costa (  [id number]) [id number] Page 4 of 9 -------------------------------------------------------------------------------- Physician Orders Details Patient Name: Date of Service: NO RMA [name], [name]. 06/23/2023 2:00 PM Medical Record Number: [id number] Patient Account Number: [id number] Date of Birth/Sex: Treating RN: 1940/10/01 (82 y.o. Erin [name] Primary Care Provider: [name] Other Clinician: Referring Provider: Treating Provider/Extender: [name] Weeks in Treatment: 29 The following information was scribed by: Zenaida Deed The information was scribed for: Duanne Guess Verbal / Phone Orders: No Diagnosis Coding ICD-10 Coding Code Description [phone] Non-pressure chronic ulcer of other part of left lower leg with fat layer exposed I87.332 Chronic venous hypertension (idiopathic) with ulcer and inflammation of left lower extremity I10 Essential (primary) hypertension Follow-up Appointments Return Appointment in 1 week. Anesthetic (In clinic) Topical Lidocaine 4% applied to wound bed Bathing/ Shower/ Hygiene May shower with protection but do not get wound dressing(s) wet. Protect dressing(s) with water repellant cover (for example, large plastic bag) or a cast cover and may then take shower. Edema Control - Orders / Instructions Left Lower Extremity Elevate legs to the level of the heart or above for 30 minutes daily and/or when sitting for 3-4 times a day throughout the day. Avoid standing for long periods of time. Exercise regularly Wound Treatment Wound #1 - Lower Leg Wound Laterality: Left, Medial Cleanser: Soap and Water 1 x Per Week/30 Days Discharge Instructions: May shower and wash wound with dial antibacterial soap and water prior to dressing change. Cleanser: Wound Cleanser 1 x Per Week/30 Days Discharge  Instructions: Cleanse the wound with wound cleanser prior to applying a clean dressing using gauze sponges, not tissue or cotton balls. Peri-Wound Care: Triamcinolone 15 (g) 1 x Per Week/30 Days Discharge Instructions: Use triamcinolone 15 (g) as directed Peri-Wound Care: Sween Lotion (Moisturizing lotion) 1 x Per Week/30 Days Discharge Instructions: Apply moisturizing lotion as directed Topical: Gentamicin 1 x Per Week/30 Days Discharge Instructions: As directed by physician Topical: Mupirocin Ointment 1 x Per Week/30 Days Discharge Instructions: Apply Mupirocin (Bactroban) as instructed Topical: Skintegrity Hydrogel 4 (oz) 1 x Per Week/30 Days Discharge Instructions: Apply hydrogel as directed Prim Dressing: Promogran Prisma Matrix, 4.34 (sq in) (silver collagen) 1 x Per Week/30 Days ary Discharge Instructions: Moisten collagen with saline or hydrogel Secondary Dressing: Optifoam Non-Adhesive Dressing, 4x4 in 1 x Per Week/30 Days Discharge Instructions: Apply over primary dressing as directed. Compression Wrap: Kerlix Roll 4.5x3.1 (in/yd) 1 x Per Week/30 Days Discharge Instructions: Apply Kerlix and Coban compression as directed. Compression Wrap: Coban Self-Adherent Wrap 4x5 (in/yd) 1 x Per Week/30 Days Discharge Instructions: Apply over Kerlix as directed. Electronic Signature(s) Signed: 06/23/2023 2:50:12 PM By: Duanne Guess MD FACS Erin [name] ([id number]) [id number] Page 5 of 9 Entered By: Duanne Guess on 06/23/2023 14:48:18 -------------------------------------------------------------------------------- Problem List Details Patient Name: Date of Service: NO RMA [name], [name] [name]. 06/23/2023 2:00 PM Medical Record Number: [id number] Patient Account Number: [id number] Date of Birth/Sex: Treating RN: 14-May-1941 (82 y.o. Erin [name] Primary Care Provider: [name] Other Clinician: Referring Provider: Treating  Provider/Extender: [name] Weeks in Treatment: 29 Active Problems ICD-10 Encounter Code Description Active Date MDM Diagnosis L97.822 Non-pressure chronic ulcer of other part of left lower leg with fat layer exposed5/22/2024 No Yes I87.332 Chronic venous hypertension (idiopathic) with ulcer and inflammation of left 11/26/2022 No Yes lower extremity I10 Essential (primary) hypertension 11/26/2022 No Yes Inactive Problems Resolved Problems ICD-10 Code Description Active Date Resolved Date L97.222 Non-pressure chronic ulcer  of left calf with fat layer exposed 04/21/2023 04/21/2023 L97.522 Non-pressure chronic ulcer of other part of left foot with fat layer exposed 01/13/2023 01/13/2023 Electronic Signature(s) Signed: 06/23/2023 2:50:12 PM By: Duanne Guess MD FACS Entered By: Duanne Guess on 06/23/2023 14:46:01 -------------------------------------------------------------------------------- Progress Note Details Patient Name: Date of Service: NO RMA [name], [name]. 06/23/2023 2:00 PM Medical Record Number: [id number] Patient Account Number: [id number] Date of Birth/Sex: Treating RN: [birth year] (82 y.o. F) Primary Care Provider: [name] Other Clinician: Referring Provider: Treating Provider/Extender: Erin [name], Erin [name] Weeks in Treatment: [address] [place] [name] ([id number]) [id number] Page 6 of 9 Chief Complaint Information obtained from Patient Patient presents for treatment of an open ulcer due to venous insufficiency History of Present Illness (HPI) ADMISSION 11/26/2022 This is an 82 year old woman who was referred by her primary care provider for a persistent nonhealing wound on her left lower extremity. It has been present since February of this year. She has been treated with Keflex for cellulitis and her PCP diagnosed her with chronic venous hypertension with ulcer  and inflammation. I do not see any formal venous reflux studies in the electronic medical record. She is not diabetic. She does not smoke. Her PCP has been washing her leg each week and applying a Kerlix and Coban wrap, but has not performed any formal debridement and has not been using any sort of topical contact layer or ointment. ABI in clinic was noncompressible, but she has a palpable pedal pulse. 12/10/2022: The wound is a bit smaller and cleaner today. There is still fairly heavy slough on the surface. Edema control is acceptable. 12/16/2022: The wound is measuring the slightest bit smaller today. There is still thick slough on the surface. Edema control is good. 12/23/2022: The wound is a little bit smaller again today. Still with fairly heavy slough accumulation. Edema control is good. She has a new small wound on the proximal portion of the same leg. It looks as though she may have developed a small blister over the weekend subsequently ruptured. There is minimal slough on the surface. 12/30/2022: The wound is about the same size, but substantially cleaner. There is some hypertrophic granulation tissue starting to emerge. The wound that was new last week has closed but she has a different new wound today on the lateral aspect of her left lower leg. It also appears to have been a blister that opened and ruptured. There is a little bit of slough on the surface. 01/06/2023: She has new wounds today. She has a wound on the PIP knuckle of her left third and fourth toes, as well as an anterior tibial wound. She says that the wrap was too tight and it also looks like when she pushes her foot into her shoe, it shifts the wrapping back to her midfoot causing bunching up of the wrap material and pain. The lateral leg wounds are both smaller with a little slough and eschar present. The original medial leg wound is smaller and more superficial. There is slough on the surface. 01/13/2023: She has new wounds  today. The anterior tibial wound has a satellite lesion that has opened up adjacent to it. Has additional wounds on her fifth toe in addition to the existing ones on her third and fourth toe. The lateral leg wounds seem to have healed. The original medial leg wound is also smaller, but she and her daughter have several questions regarding why we are not making forward progress, all of  which are very reasonable. 01/20/2023: The wounds on her toes have healed. There is a tiny wound on the dorsum of her foot with a little bit of slough on it. The anterolateral leg wound has epithelialized quite substantially and the satellite that had opened last week is now closed. The medial leg wound measured narrower today. It has hypertrophic granulation tissue and slough present. 01/27/2023: The small wounds have all healed. The medial leg wound is smaller as well, with some slough on the surface but without reaccumulation of the hypertrophic granulation tissue. 02/03/2023: The only remaining open wound is the left medial leg wound. It is smaller and a bit more superficial. There is still slough accumulation on the surface. No hypertrophic granulation tissue. She had both her venous reflux and lower extremity arterial studies, both of which were essentially normal, although her TBI's are slightly diminished. 02/10/2023: The left medial leg wound has some granulation tissue filling in, but overall, it is still fairly fibrotic. There is some slough on the surface today. 02/18/2023: The moisture balance and surface consistency have both improved. There is some slough over granulation tissue. Edema control is good. 02/24/2023: The wound measured smaller today. Moisture balance is good. Slough has reaccumulated. Edema remains well-controlled. 03/03/2023: The wound is a little bit smaller today. Moisture balance is stable. Minimal slough accumulation. There is more granulation tissue filling in over the fibrotic surface. Edema  is well-controlled. 03/10/2023: The wound is smaller again today. Moisture balance is good. Light slough accumulation on the fibrotic surface. Edema is well-controlled. 03/17/2023: The wound is slightly smaller again today. There is minimal slough accumulation but the underlying surface remains quite fibrotic. Edema control is good. 03/24/2023: Although the wound measurements are the same today, visually it looks smaller. There is still a fairly fibrotic surface underneath and slough. Edema remains well-controlled. 03/31/2023: She has a new wound that was identified when her wraps and dressings were removed today. It is just to the midline of the anterior tibial surface. The fat layer is exposed. There is a little bit of eschar and dry skin along with some thin slough. The wound closer to her ankle measured smaller today. The surface continues to improve although portions of it still remain fibrotic. There is slough buildup present. 04/07/2023: The wound that was new last week has nearly closed, but remains open by perhaps about a millimeter. The wound closer to her ankle had no significant change based on measurements, but visually it looks smaller to me. It is a little bit dry today and the surface is still fibrotic. 04/14/2023: The more proximal wound has completely healed. The wound at her ankle is quite a bit smaller with substantial epithelialization. There is just a small open portion of the distal aspect. It is covered with slough and eschar. 04/21/2023: The ankle wound is just the slightest bit smaller but it is shallower. There is slough and eschar accumulation. She has a new wound on her left posteromedial calf. It looks as though there was some friction or pinching, potentially from the Ace bandage. There is a bit of slough on the surface as well as some old blood. 04/28/2023: The ankle wound did not change in size this week. There is a little slough on the surface. The wound on her left  posteromedial calf is covered with a layer of eschar. Underneath, it is smaller and clean. Edema control is good. 05/05/2023: The ankle wound has contracted quite a bit this week. It is extremely superficial with  just a little thin eschar around the edges. The left posteromedial calf wound is healed. She had a small pustule just distal to this that was marked as a new wound, but I am not sure that the epidermis is actually violated here. 05/12/2023: The tiny calf wound that was new last week has healed. The ankle wound is smaller again today and very superficial, but has not yet completely closed. Edema control is good. 05/19/2023: Unfortunately, the ankle wound enlarged over the past week. No malodor or purulent drainage to suggest infection as the etiology and her edema control remains good. 05/26/2023: The ankle wound has contracted since her last visit. The surface has a bit of slough on it. Edema control is good. Erin [name], Erin [name] ([id number]) [id number] Page 7 of 9 06/02/2023: No significant change to the wound this week. 06/23/2023: The wound measured smaller this week. The surface is a little bit less fibrotic and there are more emerging buds of granulation tissue. Minimal slough. Objective Constitutional Hypertensive, asymptomatic. no acute distress. Vitals Time Taken: 1:59 AM, Height: 60 in, Weight: 138 lbs, BMI: 26.9, Temperature: 98.1 F, Pulse: 75 bpm, Respiratory Rate: 18 breaths/min, Blood Pressure: 174/81 mmHg. Respiratory Normal work of breathing on room air.. General Notes: 06/23/2023: The wound measured smaller this week. The surface is a little bit less fibrotic and there are more emerging buds of granulation tissue. Minimal slough. Integumentary (Hair, Skin) Wound #1 status is Open. Original cause of wound was Gradually Appeared. The date acquired was: 08/29/2022. The wound has been in treatment 29 weeks. The wound is located on the Left,Medial  Lower Leg. The wound measures 0.7cm length x 0.6cm width x 0.1cm depth; 0.33cm^2 area and 0.033cm^3 volume. There is Fat Layer (Subcutaneous Tissue) exposed. There is no tunneling or undermining noted. There is a medium amount of serosanguineous drainage noted. The wound margin is flat and intact. There is large (67-100%) red granulation within the wound bed. There is a small (1-33%) amount of necrotic tissue within the wound bed including Adherent Slough. The periwound skin appearance had no abnormalities noted for texture. The periwound skin appearance had no abnormalities noted for moisture. The periwound skin appearance exhibited: Hemosiderin Staining. Periwound temperature was noted as No Abnormality. The periwound has tenderness on palpation. 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 There was a Excisional Skin/Subcutaneous Tissue Debridement with a total area of 0.33 sq cm performed by Duanne Guess, MD. With the following instrument(s): Curette to remove Viable and Non-Viable tissue/material. Material removed includes Subcutaneous Tissue and Slough and after achieving pain control using Lidocaine 4% T opical Solution. No specimens were taken. A time out was conducted at 14:40, prior to the start of the procedure. A Minimum amount of bleeding was controlled with Pressure. The procedure was tolerated well with a pain level of 2 throughout and a pain level of 1 following the procedure. Post Debridement Measurements: 0.7cm length x 0.6cm width x 0.1cm depth; 0.033cm^3 volume. Character of Wound/Ulcer Post Debridement is  improved. Severity of Tissue Post Debridement is: Fat layer exposed. Post procedure Diagnosis Wound #1: Same as Pre-Procedure Plan Follow-up Appointments: Return Appointment in 1 week. Anesthetic: (In clinic) Topical Lidocaine 4% applied  to wound bed Bathing/ Shower/ Hygiene: May shower with protection but do not get wound dressing(s) wet. Protect dressing(s) with water repellant cover (for example, large plastic bag) or a cast cover and may then take shower. Edema Control - Orders / Instructions: Elevate legs to the level of the heart or above for 30 minutes daily and/or when sitting for 3-4 times a day throughout the day. Avoid standing for long periods of time. Exercise regularly WOUND #1: - Lower Leg Wound Laterality: Left, Medial Cleanser: Soap and Water 1 x Per Week/30 Days Erin [name], Erin [name] ([id number]) [phone].pdf Page 8 of 9 Discharge Instructions: May shower and wash wound with dial antibacterial soap and water prior to dressing change. Cleanser: Wound Cleanser 1 x Per Week/30 Days Discharge Instructions: Cleanse the wound with wound cleanser prior to applying a clean dressing using gauze sponges, not tissue or cotton balls. Peri-Wound Care: Triamcinolone 15 (g) 1 x Per Week/30 Days Discharge Instructions: Use triamcinolone 15 (g) as directed Peri-Wound Care: Sween Lotion (Moisturizing lotion) 1 x Per Week/30 Days Discharge Instructions: Apply moisturizing lotion as directed Topical: Gentamicin 1 x Per Week/30 Days Discharge Instructions: As directed by physician Topical: Mupirocin Ointment 1 x Per Week/30 Days Discharge Instructions: Apply Mupirocin (Bactroban) as instructed Topical: Skintegrity Hydrogel 4 (oz) 1 x Per Week/30 Days Discharge Instructions: Apply hydrogel as directed Prim Dressing: Promogran Prisma Matrix, 4.34 (sq in) (silver collagen) 1 x Per Week/30 Days ary Discharge Instructions: Moisten collagen with saline or  hydrogel Secondary Dressing: Optifoam Non-Adhesive Dressing, 4x4 in 1 x Per Week/30 Days Discharge Instructions: Apply over primary dressing as directed. Com pression Wrap: Kerlix Roll 4.5x3.1 (in/yd) 1 x Per Week/30 Days Discharge Instructions: Apply Kerlix and Coban compression as directed. Com pression Wrap: Coban Self-Adherent Wrap 4x5 (in/yd) 1 x Per Week/30 Days Discharge Instructions: Apply over Kerlix as directed. 06/23/2023: The wound measured smaller this week. The surface is a little bit less fibrotic and there are more emerging buds of granulation tissue. Minimal slough. I used a curette to debride slough and subcutaneous tissue from the wound. We will continue the mixture of topical gentamicin and mupirocin for suppression of normal skin flora. Continue Prisma silver collagen with Kerlix and Coban compression. Follow-up in 1 week. Electronic Signature(s) Signed: 06/23/2023 2:50:12 PM By: Duanne Guess MD FACS Entered By: Duanne Guess on 06/23/2023 14:49:00 -------------------------------------------------------------------------------- SuperBill Details Patient Name: Date of Service: NO RMA [name], [name]. 06/23/2023 Medical Record Number: [id number] Patient Account Number: [id number] Date of Birth/Sex: Treating RN: 03-10-1941 (82 y.o. F) Primary Care Provider: [name] Other Clinician: Referring Provider: Treating Provider/Extender: Erin [name], Erin [name] Weeks in Treatment: 29 Diagnosis Coding ICD-10 Codes Code Description [phone] Non-pressure chronic ulcer of other part of left lower leg with fat layer exposed I87.332 Chronic venous hypertension (idiopathic) with ulcer and inflammation of left lower extremity I10 Essential (primary) hypertension Facility Procedures : CPT4 Code: [id number] Description: 11042 - DEB SUBQ TISSUE 20 SQ CM/< ICD-10 Diagnosis Description L97.822 Non-pressure chronic ulcer of other part of left lower leg with fat  layer expo Modifier: sed Quantity: 1 Physician Procedures : CPT4 Code Description Modifier [id number] 99214 - WC PHYS LEVEL 4 - EST PT ICD-10 Diagnosis Description L97.822 Non-pressure chronic ulcer of other part of left lower leg with fat layer exposed I87.332 Chronic venous hypertension (idiopathic) with ulcer  and inflammation of left lower extremity I10 Essential (primary) hypertension Erin [name], Erin [name] ([id number]) [id number] 11042 - WC PHYS SUBQ TISS 20 SQ  CM 1 ICD-10 Diagnosis Description L97.822 Non-pressure chronic ulcer  of other part of left lower leg with fat layer exposed Quantity: 1 pdf Page 9 of 9 Electronic Signature(s) Signed: 06/23/2023 2:50:12 PM By: Duanne Guess MD FACS Entered By: Duanne Guess on 06/23/2023 14:49:18

## 2023-06-24 NOTE — Progress Notes (Signed)
AIDELIZ, MCPHETRIDGE ([id number]) [id number] Page 1 of 7 Visit Report for 06/23/2023 Arrival Information Details Patient Name: Date of Service: [name], [name] RBA RA N. 06/23/2023 2:00 PM Medical Record Number: [id number] Patient Account Number: [id number] Date of Birth/Sex: Treating RN: [birth year] (82 y.o. F) Primary Care Bernard Slayden: [name] Other Clinician: Referring Dayna Geurts: Treating Guila Owensby/Extender: [name] Weeks in Treatment: 42 Visit Information History Since Last Visit Added or deleted any medications: No Patient Arrived: Ambulatory Any new allergies or adverse reactions: No Arrival Time: 13:59 Had a fall or experienced change in No Accompanied By: daughter activities of daily living that may affect Transfer Assistance: None risk of falls: Patient Identification Verified: Yes Signs or symptoms of abuse/neglect since last visito No Secondary Verification Process Completed: Yes Hospitalized since last visit: No Patient Requires Transmission-Based Precautions: No Implantable device outside of the clinic excluding No Patient Has Alerts: No cellular tissue based products placed in the center since last visit: Has Dressing in Place as Prescribed: Yes Has Compression in Place as Prescribed: Yes Pain Present Now: No Electronic Signature(s) Signed: 06/23/2023 5:11:40 PM By: Zenaida Deed RN, BSN Entered By: Zenaida Deed on 06/23/2023 14:31:43 -------------------------------------------------------------------------------- Encounter Discharge Information Details Patient Name: Date of Service: [name], [name] 06/23/2023 2:00 PM Medical Record Number: [id number] Patient Account Number: [id number] Date of Birth/Sex: Treating RN: 12-Jun-1941 (82 y.o. Erin [name] Primary Care Bassheva Flury: [name] Other Clinician: Referring Chaka Boyson: Treating Alejandro Adcox/Extender: [name] Weeks in Treatment: 79 Encounter Discharge Information Items Post Procedure Vitals Discharge Condition: Stable Temperature (F): 98.1 Ambulatory Status: Ambulatory Pulse (bpm): 75 Discharge Destination: Home Respiratory Rate (breaths/min): 18 Transportation: Private Auto Blood Pressure (mmHg): 174/81 Accompanied By: daughter Schedule Follow-up Appointment: Yes Clinical Summary of Care: Patient Declined Electronic Signature(s) Signed: 06/23/2023 5:11:40 PM By: Zenaida Deed RN, BSN Entered By: Zenaida Deed on 06/23/2023 14:55:51 Erin [name] ([id number]) [id number] Page 2 of 7 -------------------------------------------------------------------------------- Lower Extremity Assessment Details Patient Name: Date of Service: [name], [name] 06/23/2023 2:00 PM Medical Record Number: [id number] Patient Account Number: [id number] Date of Birth/Sex: Treating RN: 1940-08-02 (82 y.o. Erin [name] Primary Care Morrisa Aldaba: [name] Other Clinician: Referring Vaani Morren: Treating Tanush Drees/Extender: [name], [name] in Treatment: 29 Edema Assessment Assessed: [Left: No] [Right: No] Edema: [Left: Ye] [Right: s] Calf Left: Right: Point of Measurement: From Medial Instep 28 cm Ankle Left: Right: Point of Measurement: From Medial Instep 18 cm Vascular Assessment Pulses: Dorsalis Pedis Palpable: [Left:Yes] Extremity colors, hair growth, and conditions: Extremity Color: [Left:Hyperpigmented] Hair Growth on Extremity: [Left:No] Temperature of Extremity: [Left:Warm < 3 seconds] Electronic Signature(s) Signed: 06/23/2023 5:11:40 PM By: Zenaida Deed RN, BSN Entered By: Zenaida Deed on 06/23/2023 14:33:39 -------------------------------------------------------------------------------- Multi Wound Chart Details Patient Name: Date of Service: [name], [name] 06/23/2023 2:00 PM Medical Record Number:  [id number] Patient Account Number: [id number] Date of Birth/Sex: Treating RN: 09/20/1940 (82 y.o. F) Primary Care Hurbert Duran: [name] Other Clinician: Referring Winifred Bodiford: Treating Star Resler/Extender: [name], [name] in Treatment: 29 Vital Signs Height(in): 60 Pulse(bpm): 75 Weight(lbs): 138 Blood Pressure(mmHg): 174/81 Body Mass Index(BMI): 26.9 Temperature(F): 98.1 Respiratory Rate(breaths/min): 18 [1:Photos:] [N/A:N/A] Left, Medial Lower Leg N/A N/A Wound Location: Gradually Appeared N/A N/A Wounding Event: Venous Leg Ulcer N/A N/A Primary Etiology: Cataracts, Arrhythmia, Hypertension, N/A N/A Comorbid History: Peripheral Venous Disease, Gout, Osteoarthritis 08/29/2022 N/A N/A Date Acquired: 29 N/A  N/A Weeks of Treatment: Open N/A N/A Wound Status: No N/A N/A Wound Recurrence: 0.7x0.6x0.1 N/A N/A Measurements L x W x D (cm) 0.33 N/A N/A A (cm) : rea 0.033 N/A N/A Volume (cm) : 94.40% N/A N/A % Reduction in A rea: 94.40% N/A N/A % Reduction in Volume: Full Thickness Without Exposed N/A N/A Classification: Support Structures Medium N/A N/A Exudate A mount: Serosanguineous N/A N/A Exudate Type: red, brown N/A N/A Exudate Color: Flat and Intact N/A N/A Wound Margin: Large (67-100%) N/A N/A Granulation A mount: Red N/A N/A Granulation Quality: Small (1-33%) N/A N/A Necrotic A mount: Fat Layer (Subcutaneous Tissue): Yes N/A N/A Exposed Structures: Fascia: No Tendon: No Muscle: No Joint: No Bone: No Small (1-33%) N/A N/A Epithelialization: Debridement - Excisional N/A N/A Debridement: Pre-procedure Verification/Time Out 14:40 N/A N/A Taken: Lidocaine 4% Topical Solution N/A N/A Pain Control: Subcutaneous, Slough N/A N/A Tissue Debrided: Skin/Subcutaneous Tissue N/A N/A Level: 0.33 N/A N/A Debridement A (sq cm): rea Curette N/A N/A Instrument: Minimum N/A N/A Bleeding: Pressure N/A N/A Hemostasis A  chieved: 2 N/A N/A Procedural Pain: 1 N/A N/A Post Procedural Pain: Procedure was tolerated well N/A N/A Debridement Treatment Response: 0.7x0.6x0.1 N/A N/A Post Debridement Measurements L x W x D (cm) 0.033 N/A N/A Post Debridement Volume: (cm) No Abnormalities Noted N/A N/A Periwound Skin Texture: No Abnormalities Noted N/A N/A Periwound Skin Moisture: Hemosiderin Staining: Yes N/A N/A Periwound Skin Color: No Abnormality N/A N/A Temperature: Yes N/A N/A Tenderness on Palpation: Debridement N/A N/A Procedures Performed: Treatment Notes Electronic Signature(s) Signed: 06/23/2023 2:50:12 PM By: Duanne Guess MD FACS Entered By: Duanne Guess on 06/23/2023 14:46:08 -------------------------------------------------------------------------------- Multi-Disciplinary Care Plan Details Patient Name: Date of Service: [name], [name] 06/23/2023 2:00 PM Medical Record Number: [id number] Patient Account Number: [id number] Date of Birth/Sex: Treating RN: 1940/11/25 (82 y.o. Erin [name] Primary Care Edithe Dobbin: [name] Other Clinician: Referring Duan Scharnhorst: Treating Koral Thaden/Extender: Pandora, Balcazar ([id number]) [id number] Page 4 of 7 Weeks in Treatment: 29 Multidisciplinary Care Plan reviewed with physician Active Inactive Necrotic Tissue Nursing Diagnoses: Impaired tissue integrity related to necrotic/devitalized tissue Knowledge deficit related to management of necrotic/devitalized tissue Goals: Necrotic/devitalized tissue will be minimized in the wound bed Date Initiated: 11/26/2022 Target Resolution Date: 07/03/2023 Goal Status: Active Patient/caregiver will verbalize understanding of reason and process for debridement of necrotic tissue Date Initiated: 11/26/2022 Date Inactivated: 01/20/2023 Target Resolution Date: 03/06/2023 Goal Status: Met Interventions: Assess patient pain  level pre-, during and post procedure and prior to discharge Provide education on necrotic tissue and debridement process Treatment Activities: Apply topical anesthetic as ordered : 11/26/2022 Notes: Wound/Skin Impairment Nursing Diagnoses: Impaired tissue integrity Knowledge deficit related to ulceration/compromised skin integrity Goals: Patient/caregiver will verbalize understanding of skin care regimen Date Initiated: 11/26/2022 Target Resolution Date: 07/03/2023 Goal Status: Active Interventions: Assess ulceration(s) every visit Treatment Activities: Skin care regimen initiated : 11/26/2022 Topical wound management initiated : 11/26/2022 Notes: Electronic Signature(s) Signed: 06/23/2023 5:11:40 PM By: Zenaida Deed RN, BSN Entered By: Zenaida Deed on 06/23/2023 14:35:42 -------------------------------------------------------------------------------- Pain Assessment Details Patient Name: Date of Service: [name], [name] 06/23/2023 2:00 PM Medical Record Number: [id number] Patient Account Number: [id number] Date of Birth/Sex: Treating RN: July 30, 1940 (82 y.o. F) Primary Care Devinn Hurwitz: [name] Other Clinician: Referring Sukari Grist: Treating Shantasia Hunnell/Extender: [name], [name] in Treatment: 29 Active Problems Location of Pain Severity and Description of Pain Patient Has Paino No Site  Locations [place], [place] ([id number]) [id number] Page 5 of 7 Site Locations Rate the pain. Current Pain Level: 0 Pain Management and Medication Current Pain Management: Electronic Signature(s) Signed: 06/23/2023 5:11:40 PM By: Zenaida Deed RN, BSN Entered By: Zenaida Deed on 06/23/2023 14:31:56 -------------------------------------------------------------------------------- Patient/Caregiver Education Details Patient Name: Date of Service: [name], [name] 12/17/2024andnbsp2:00 PM Medical Record Number:  [id number] Patient Account Number: [id number] Date of Birth/Gender: Treating RN: Dec 30, 1940 (82 y.o. Erin [name] Primary Care Physician: [name] Other Clinician: Referring Physician: Treating Physician/Extender: [name] in Treatment: 62 Education Assessment Education Provided To: Patient Education Topics Provided Venous: Methods: Explain/Verbal Responses: Reinforcements needed, State content correctly Electronic Signature(s) Signed: 06/23/2023 5:11:40 PM By: Zenaida Deed RN, BSN Entered By: Zenaida Deed on 06/23/2023 14:35:59 -------------------------------------------------------------------------------- Wound Assessment Details Patient Name: Date of Service: [name], [name] 06/23/2023 2:00 PM Medical Record Number: [id number] Patient Account Number: [id number] Date of Birth/Sex: Treating RN: 05-10-1941 (82 y.o. F) Primary Care Mak Bonny: [name] Other Clinician: ALIVIYAH, Erin [name] ([id number]) [id number] Page 6 of 7 Referring Brent Noto: Treating Aryonna Gunnerson/Extender: [name], [name] in Treatment: 29 Wound Status Wound Number: 1 Primary Venous Leg Ulcer Etiology: Wound Location: Left, Medial Lower Leg Wound Open Wounding Event: Gradually Appeared Status: Date Acquired: 08/29/2022 Comorbid Cataracts, Arrhythmia, Hypertension, Peripheral Venous Weeks Of Treatment: 29 History: Disease, Gout, Osteoarthritis Clustered Wound: No Photos Wound Measurements Length: (cm) 0.7 Width: (cm) 0.6 Depth: (cm) 0.1 Area: (cm) 0.33 Volume: (cm) 0.033 % Reduction in Area: 94.4% % Reduction in Volume: 94.4% Epithelialization: Small (1-33%) Tunneling: No Undermining: No Wound Description Classification: Full Thickness Without Exposed Support Structures Wound Margin: Flat and Intact Exudate Amount: Medium Exudate Type: Serosanguineous Exudate Color: red,  brown Foul Odor After Cleansing: No Slough/Fibrino Yes Wound Bed Granulation Amount: Large (67-100%) Exposed Structure Granulation Quality: Red Fascia Exposed: No Necrotic Amount: Small (1-33%) Fat Layer (Subcutaneous Tissue) Exposed: Yes Necrotic Quality: Adherent Slough Tendon Exposed: No Muscle Exposed: No Joint Exposed: No Bone Exposed: No Periwound Skin Texture Texture Color No Abnormalities Noted: Yes No Abnormalities Noted: No Hemosiderin Staining: Yes Moisture No Abnormalities Noted: Yes Temperature / Pain Temperature: No Abnormality Tenderness on Palpation: Yes Treatment Notes Wound #1 (Lower Leg) Wound Laterality: Left, Medial Cleanser Soap and Water Discharge Instruction: May shower and wash wound with dial antibacterial soap and water prior to dressing change. Wound Cleanser Discharge Instruction: Cleanse the wound with wound cleanser prior to applying a clean dressing using gauze sponges, not tissue or cotton balls. Peri-Wound Care Triamcinolone 15 (g) Discharge Instruction: Use triamcinolone 15 (g) as directed Sween Lotion (Moisturizing lotion) Discharge Instruction: Apply moisturizing lotion as directed Erin [name], Erin [name] ([id number]) [id number].pdf Page 7 of 7 Topical Gentamicin Discharge Instruction: As directed by physician Mupirocin Ointment Discharge Instruction: Apply Mupirocin (Bactroban) as instructed Skintegrity Hydrogel 4 (oz) Discharge Instruction: Apply hydrogel as directed Primary Dressing Promogran Prisma Matrix, 4.34 (sq in) (silver collagen) Discharge Instruction: Moisten collagen with saline or hydrogel Secondary Dressing Optifoam Non-Adhesive Dressing, 4x4 in Discharge Instruction: Apply over primary dressing as directed. Secured With Compression Wrap Kerlix Roll 4.5x3.1 (in/yd) Discharge Instruction: Apply Kerlix and Coban compression as directed. Coban Self-Adherent Wrap 4x5 (in/yd) Discharge Instruction:  Apply over Kerlix as directed. Compression Stockings Add-Ons Electronic Signature(s) Signed: 06/23/2023 5:11:40 PM By: Zenaida Deed RN, BSN Entered By: Zenaida Deed on 06/23/2023 14:35:06 -------------------------------------------------------------------------------- Vitals Details Patient Name: Date of Service: [name], [name]  06/23/2023 2:00 PM Medical Record Number: [id number] Patient Account Number: [id number] Date of Birth/Sex: Treating RN: Nov 30, 1940 (82 y.o. F) Primary Care Jasime Westergren: [name] Other Clinician: Referring [place]: Treating Anothony Bursch/Extender: [name], [name] in Treatment: 29 Vital Signs Time Taken: 01:59 Temperature (F): 98.1 Height (in): 60 Pulse (bpm): 75 Weight (lbs): 138 Respiratory Rate (breaths/min): 18 Body Mass Index (BMI): 26.9 Blood Pressure (mmHg): 174/81 Reference Range: 80 - 120 mg / dl Electronic Signature(s) Signed: 06/23/2023 5:11:40 PM By: Zenaida Deed RN, BSN Entered By: Zenaida Deed on 06/23/2023 14:31:49

## 2023-07-02 ENCOUNTER — Encounter (HOSPITAL_BASED_OUTPATIENT_CLINIC_OR_DEPARTMENT_OTHER): Payer: Medicare HMO | Admitting: General Surgery

## 2023-07-02 DIAGNOSIS — M1990 Unspecified osteoarthritis, unspecified site: Secondary | ICD-10-CM | POA: Diagnosis not present

## 2023-07-02 DIAGNOSIS — L97822 Non-pressure chronic ulcer of other part of left lower leg with fat layer exposed: Secondary | ICD-10-CM | POA: Diagnosis not present

## 2023-07-02 DIAGNOSIS — I872 Venous insufficiency (chronic) (peripheral): Secondary | ICD-10-CM | POA: Diagnosis not present

## 2023-07-02 DIAGNOSIS — M109 Gout, unspecified: Secondary | ICD-10-CM | POA: Diagnosis not present

## 2023-07-02 DIAGNOSIS — I10 Essential (primary) hypertension: Secondary | ICD-10-CM | POA: Diagnosis not present

## 2023-07-02 NOTE — Progress Notes (Signed)
Erin [name], Erin [name] ([id number]) [id number] Page 1 of 9 Visit Report for 07/02/2023 Chief Complaint Document Details Patient Name: Date of Service: [name], [name] RBA RA N. 07/02/2023 10:45 A M Medical Record Number: [id number] Patient Account Number: [id number] Date of Birth/Sex: Treating RN: 1940/07/25 (82 y.o. F) Primary Care Provider: [name] Other Clinician: Referring Provider: Treating Provider/Extender: [name] Weeks in Treatment: 29 Information Obtained from: Patient Chief Complaint Patient presents for treatment of an open ulcer due to venous insufficiency Electronic Signature(s) Signed: 07/02/2023 12:38:46 PM By: Duanne Guess MD FACS Entered By: Duanne Guess on 07/02/2023 08:23:23 -------------------------------------------------------------------------------- Debridement Details Patient Name: Date of Service: [name], [name] 07/02/2023 10:45 A M Medical Record Number: [id number] Patient Account Number: [id number] Date of Birth/Sex: Treating RN: 1941-06-25 (82 y.o. Orville Govern Primary Care Provider: [name] Other Clinician: Referring Provider: Treating Provider/Extender: [name] Weeks in Treatment: 31 Debridement Performed for Assessment: Wound #1 Left,Medial Lower Leg Performed By: Physician Duanne Guess, MD The following information was scribed by: Redmond Pulling The information was scribed for: Duanne Guess Debridement Type: Debridement Severity of Tissue Pre Debridement: Fat layer exposed Level of Consciousness (Pre-procedure): Awake and Alert Pre-procedure Verification/Time Out Yes - 11:20 Taken: Start Time: 11:20 Pain Control: Lidocaine 4% T opical Solution Percent of Wound Bed Debrided: 100% T Area Debrided (cm): otal 0.38 Tissue and other material debrided: Non-Viable, Slough, Subcutaneous, Slough Level: Skin/Subcutaneous  Tissue Debridement Description: Excisional Instrument: Curette Bleeding: Minimum Hemostasis Achieved: Pressure Response to Treatment: Procedure was tolerated well Level of Consciousness (Post- Awake and Alert procedure): Post Debridement Measurements of Total Wound Length: (cm) 0.7 Width: (cm) 0.7 Depth: (cm) 0.1 Volume: (cm) 0.038 Character of Wound/Ulcer Post Debridement: Improved Severity of Tissue Post Debridement: Fat layer exposed SAVANNAH, MEIR ([id number]) [id number] Page 2 of 9 Post Procedure Diagnosis Same as Pre-procedure Electronic Signature(s) Signed: 07/02/2023 12:38:46 PM By: Duanne Guess MD FACS Signed: 07/02/2023 3:45:54 PM By: Redmond Pulling RN, BSN Entered By: Redmond Pulling on 07/02/2023 08:20:01 -------------------------------------------------------------------------------- HPI Details Patient Name: Date of Service: [name], [name] 07/02/2023 10:45 A M Medical Record Number: [id number] Patient Account Number: [id number] Date of Birth/Sex: Treating RN: [birth year] (82 y.o. F) Primary Care Provider: [name] Other Clinician: Referring Provider: Treating Provider/Extender: [name] Weeks in Treatment: 17 History of Present Illness HPI Description: ADMISSION 11/26/2022 This is an 82 year old woman who was referred by her primary care provider for a persistent nonhealing wound on her left lower extremity. It has been present since February of this year. 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 I do not see any formal venous reflux studies in the electronic medical record. She is not diabetic. She does not smoke. Her PCP has been washing her leg each week and applying a Kerlix and Coban wrap, but has not performed any formal debridement and has not been using any sort of topical contact layer  or ointment. ABI in clinic was noncompressible, but she has a palpable pedal pulse. 12/10/2022: The wound is a bit smaller and cleaner today. There is still fairly heavy slough on the surface. Edema control is acceptable. 12/16/2022: The wound is measuring the slightest bit smaller today. There is still thick slough on the surface. Edema control is good. 12/23/2022: The wound is a little bit smaller again today. Still  with fairly heavy slough accumulation. Edema control is good. She has a new small wound on the proximal portion of the same leg. It looks as though she may have developed a small blister over the weekend subsequently ruptured. There is minimal slough on the surface. 12/30/2022: The wound is about the same size, but substantially cleaner. There is some hypertrophic granulation tissue starting to emerge. The wound that was new last week has closed but she has a different new wound today on the lateral aspect of her left lower leg. It also appears to have been a blister that opened and ruptured. There is a little bit of slough on the surface. 01/06/2023: She has new wounds today. She has a wound on the PIP knuckle of her left third and fourth toes, as well as an anterior tibial wound. She says that the wrap was too tight and it also looks like when she pushes her foot into her shoe, it shifts the wrapping back to her midfoot causing bunching up of the wrap material and pain. The lateral leg wounds are both smaller with a little slough and eschar present. The original medial leg wound is smaller and more superficial. There is slough on the surface. 01/13/2023: She has new wounds today. The anterior tibial wound has a satellite lesion that has opened up adjacent to it. Has additional wounds on her fifth toe in addition to the existing ones on her third and fourth toe. The lateral leg wounds seem to have healed. The original medial leg wound is also smaller, but she and her daughter have several  questions regarding why we are not making forward progress, all of which are very reasonable. 01/20/2023: The wounds on her toes have healed. There is a tiny wound on the dorsum of her foot with a little bit of slough on it. The anterolateral leg wound has epithelialized quite substantially and the satellite that had opened last week is now closed. The medial leg wound measured narrower today. It has hypertrophic granulation tissue and slough present. 01/27/2023: The small wounds have all healed. The medial leg wound is smaller as well, with some slough on the surface but without reaccumulation of the hypertrophic granulation tissue. 02/03/2023: The only remaining open wound is the left medial leg wound. It is smaller and a bit more superficial. There is still slough accumulation on the surface. No hypertrophic granulation tissue. She had both her venous reflux and lower extremity arterial studies, both of which were essentially normal, although her TBI's are slightly diminished. 02/10/2023: The left medial leg wound has some granulation tissue filling in, but overall, it is still fairly fibrotic. There is some slough on the surface today. 02/18/2023: The moisture balance and surface consistency have both improved. There is some slough over granulation tissue. Edema control is good. 02/24/2023: The wound measured smaller today. Moisture balance is good. Slough has reaccumulated. Edema remains well-controlled. 03/03/2023: The wound is a little bit smaller today. Moisture balance is stable. Minimal slough accumulation. There is more granulation tissue filling in over the fibrotic surface. Edema is well-controlled. 03/10/2023: The wound is smaller again today. Moisture balance is good. Light slough accumulation on the fibrotic surface. Edema is well-controlled. 03/17/2023: The wound is slightly smaller again today. There is minimal slough accumulation but the underlying surface remains quite fibrotic. Edema control  is good. 03/24/2023: Although the wound measurements are the same today, visually it looks smaller. There is still a fairly fibrotic surface underneath and slough. Loyal Jacobson N (  [id number]) [id number] Page 3 of 9 Edema remains well-controlled. 03/31/2023: She has a new wound that was identified when her wraps and dressings were removed today. It is just to the midline of the anterior tibial surface. The fat layer is exposed. There is a little bit of eschar and dry skin along with some thin slough. The wound closer to her ankle measured smaller today. The surface continues to improve although portions of it still remain fibrotic. There is slough buildup present. 04/07/2023: The wound that was new last week has nearly closed, but remains open by perhaps about a millimeter. The wound closer to her ankle had no significant change based on measurements, but visually it looks smaller to me. It is a little bit dry today and the surface is still fibrotic. 04/14/2023: The more proximal wound has completely healed. The wound at her ankle is quite a bit smaller with substantial epithelialization. There is just a small open portion of the distal aspect. It is covered with slough and eschar. 04/21/2023: The ankle wound is just the slightest bit smaller but it is shallower. There is slough and eschar accumulation. She has a new wound on her left posteromedial calf. It looks as though there was some friction or pinching, potentially from the Ace bandage. There is a bit of slough on the surface as well as some old blood. 04/28/2023: The ankle wound did not change in size this week. There is a little slough on the surface. The wound on her left posteromedial calf is covered with a layer of eschar. Underneath, it is smaller and clean. Edema control is good. 05/05/2023: The ankle wound has contracted quite a bit this week. It is extremely superficial with just a little thin eschar around  the edges. The left posteromedial calf wound is healed. She had a small pustule just distal to this that was marked as a new wound, but I am not sure that the epidermis is actually violated here. 05/12/2023: The tiny calf wound that was new last week has healed. The ankle wound is smaller again today and very superficial, but has not yet completely closed. Edema control is good. 05/19/2023: Unfortunately, the ankle wound enlarged over the past week. No malodor or purulent drainage to suggest infection as the etiology and her edema control remains good. 05/26/2023: The ankle wound has contracted since her last visit. The surface has a bit of slough on it. Edema control is good. 06/02/2023: No significant change to the wound this week. 06/23/2023: The wound measured smaller this week. The surface is a little bit less fibrotic and there are more emerging buds of granulation tissue. Minimal slough. 07/02/2023: The wound is about the same size this week, but the quality of the tissue surface has improved. There is some slough accumulation. Electronic Signature(s) Signed: 07/02/2023 12:38:46 PM By: Duanne Guess MD FACS Entered By: Duanne Guess on 07/02/2023 08:24:09 -------------------------------------------------------------------------------- Physical Exam Details Patient Name: Date of Service: [name], [name] 07/02/2023 10:45 A M Medical Record Number: [id number] Patient Account Number: [id number] Date of Birth/Sex: Treating RN: [birth year] (82 y.o. F) Primary Care Provider: [name] Other Clinician: Referring Provider: Treating Provider/Extender: [name], [name] in Treatment: 29 Constitutional Hypertensive, asymptomatic. . . . no acute distress. Respiratory Normal work of breathing on room air.. Notes 07/02/2023: The wound is about the same size this week, but the quality of the tissue surface has improved. There is some slough  accumulation. Electronic Signature(s) Signed:  07/02/2023 12:38:46 PM By: Duanne Guess MD FACS Entered By: Duanne Guess on 07/02/2023 08:24:52 [name] ([id number]) [id number] Page 4 of 9 -------------------------------------------------------------------------------- Physician Orders Details Patient Name: Date of Service: [name], [name] RBA RA N. 07/02/2023 10:45 A M Medical Record Number: [id number] Patient Account Number: [id number] Date of Birth/Sex: Treating RN: 03/25/1941 (82 y.o. Orville Govern Primary Care Provider: [name] Other Clinician: Referring Provider: Treating Provider/Extender: [name] Weeks in Treatment: 71 Verbal / Phone Orders: No Diagnosis Coding ICD-10 Coding Code Description [phone] Non-pressure chronic ulcer of other part of left lower leg with fat layer exposed I87.332 Chronic venous hypertension (idiopathic) with ulcer and inflammation of left lower extremity I10 Essential (primary) hypertension Follow-up Appointments ppointment in 1 week. - Dr Lady Gary - 1/7 @ 3:00pm Return A Anesthetic (In clinic) Topical Lidocaine 4% applied to wound bed Bathing/ Shower/ Hygiene May shower with protection but do not get wound dressing(s) wet. Protect dressing(s) with water repellant cover (for example, large plastic bag) or a cast cover and may then take shower. Edema Control - Orders / Instructions Left Lower Extremity Elevate legs to the level of the heart or above for 30 minutes daily and/or when sitting for 3-4 times a day throughout the day. Avoid standing for long periods of time. Exercise regularly Wound Treatment Wound #1 - Lower Leg Wound Laterality: Left, Medial Cleanser: Soap and Water 1 x Per Week/30 Days Discharge Instructions: May shower and wash wound with dial antibacterial soap and water prior to dressing change. Cleanser: Wound Cleanser 1 x Per Week/30  Days Discharge Instructions: Cleanse the wound with wound cleanser prior to applying a clean dressing using gauze sponges, not tissue or cotton balls. Peri-Wound Care: Triamcinolone 15 (g) 1 x Per Week/30 Days Discharge Instructions: Use triamcinolone 15 (g) as directed Peri-Wound Care: Sween Lotion (Moisturizing lotion) 1 x Per Week/30 Days Discharge Instructions: Apply moisturizing lotion as directed Topical: Gentamicin 1 x Per Week/30 Days Discharge Instructions: As directed by physician Topical: Mupirocin Ointment 1 x Per Week/30 Days Discharge Instructions: Apply Mupirocin (Bactroban) as instructed Topical: Skintegrity Hydrogel 4 (oz) 1 x Per Week/30 Days Discharge Instructions: Apply hydrogel as directed Prim Dressing: Promogran Prisma Matrix, 4.34 (sq in) (silver collagen) 1 x Per Week/30 Days ary Discharge Instructions: Moisten collagen with saline or hydrogel Compression Wrap: Kerlix Roll 4.5x3.1 (in/yd) 1 x Per Week/30 Days Discharge Instructions: Apply Kerlix and Coban compression as directed. Compression Wrap: Coban Self-Adherent Wrap 4x5 (in/yd) 1 x Per Week/30 Days Discharge Instructions: Apply over Kerlix as directed. Electronic Signature(s) Signed: 07/02/2023 12:38:46 PM By: Duanne Guess MD FACS Signed: 07/02/2023 3:45:54 PM By: Redmond Pulling RN, BSN Entered By: Redmond Pulling on 07/02/2023 08:30:52 VIANN, BALASUBRAMANIAN ([id number]) [id number] Page 5 of 9 -------------------------------------------------------------------------------- Problem List Details Patient Name: Date of Service: [name], [name] 07/02/2023 10:45 A M Medical Record Number: [id number] Patient Account Number: [id number] Date of Birth/Sex: Treating RN: Mar 09, 1941 (82 y.o. F) Primary Care Provider: [name] Other Clinician: Referring Provider: Treating Provider/Extender: [name], Serafina Royals Weeks in Treatment: 52 Active  Problems ICD-10 Encounter Code Description Active Date MDM Diagnosis L97.822 Non-pressure chronic ulcer of other part of left lower leg with fat layer exposed5/22/2024 No Yes I87.332 Chronic venous hypertension (idiopathic) with ulcer and inflammation of left 11/26/2022 No Yes lower extremity I10 Essential (primary) hypertension 11/26/2022 No Yes Inactive Problems Resolved Problems ICD-10 Code Description Active Date Resolved Date L97.222 Non-pressure chronic ulcer  of left calf with fat layer exposed 04/21/2023 04/21/2023 L97.522 Non-pressure chronic ulcer of other part of left foot with fat layer exposed 01/13/2023 01/13/2023 Electronic Signature(s) Signed: 07/02/2023 12:38:46 PM By: Duanne Guess MD FACS Entered By: Duanne Guess on 07/02/2023 08:22:51 -------------------------------------------------------------------------------- Progress Note Details Patient Name: Date of Service: [name], [name] 07/02/2023 10:45 A M Medical Record Number: [id number] Patient Account Number: [id number] Date of Birth/Sex: Treating RN: 10/26/1940 (82 y.o. F) Primary Care Provider: [name] Other Clinician: Referring Provider: Treating Provider/Extender: [name] Weeks in Treatment: 63 Subjective Chief Complaint Information obtained from Patient Patient presents for treatment of an open ulcer due to venous insufficiency SHAUNTEL, KEEFNER ([id number]) [id number] Page 6 of 9 History of Present Illness (HPI) ADMISSION 11/26/2022 This is an 81 year old woman who was referred by her primary care provider for a persistent nonhealing wound on her left lower extremity. It has been present since February of this year. 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 I do not see any formal venous reflux studies in the electronic medical record. She  is not diabetic. She does not smoke. Her PCP has been washing her leg each week and applying a Kerlix and Coban wrap, but has not performed any formal debridement and has not been using any sort of topical contact layer or ointment. ABI in clinic was noncompressible, but she has a palpable pedal pulse. 12/10/2022: The wound is a bit smaller and cleaner today. There is still fairly heavy slough on the surface. Edema control is acceptable. 12/16/2022: The wound is measuring the slightest bit smaller today. There is still thick slough on the surface. Edema control is good. 12/23/2022: The wound is a little bit smaller again today. Still with fairly heavy slough accumulation. Edema control is good. She has a new small wound on the proximal portion of the same leg. It looks as though she may have developed a small blister over the weekend subsequently ruptured. There is minimal slough on the surface. 12/30/2022: The wound is about the same size, but substantially cleaner. There is some hypertrophic granulation tissue starting to emerge. The wound that was new last week has closed but she has a different new wound today on the lateral aspect of her left lower leg. It also appears to have been a blister that opened and ruptured. There is a little bit of slough on the surface. 01/06/2023: She has new wounds today. She has a wound on the PIP knuckle of her left third and fourth toes, as well as an anterior tibial wound. She says that the wrap was too tight and it also looks like when she pushes her foot into her shoe, it shifts the wrapping back to her midfoot causing bunching up of the wrap material and pain. The lateral leg wounds are both smaller with a little slough and eschar present. The original medial leg wound is smaller and more superficial. There is slough on the surface. 01/13/2023: She has new wounds today. The anterior tibial wound has a satellite lesion that has opened up adjacent to it. Has additional  wounds on her fifth toe in addition to the existing ones on her third and fourth toe. The lateral leg wounds seem to have healed. The original medial leg wound is also smaller, but she and her daughter have several questions regarding why we are not making forward progress, all  of which are very reasonable. 01/20/2023: The wounds on her toes have healed. There is a tiny wound on the dorsum of her foot with a little bit of slough on it. The anterolateral leg wound has epithelialized quite substantially and the satellite that had opened last week is now closed. The medial leg wound measured narrower today. It has hypertrophic granulation tissue and slough present. 01/27/2023: The small wounds have all healed. The medial leg wound is smaller as well, with some slough on the surface but without reaccumulation of the hypertrophic granulation tissue. 02/03/2023: The only remaining open wound is the left medial leg wound. It is smaller and a bit more superficial. There is still slough accumulation on the surface. No hypertrophic granulation tissue. She had both her venous reflux and lower extremity arterial studies, both of which were essentially normal, although her TBI's are slightly diminished. 02/10/2023: The left medial leg wound has some granulation tissue filling in, but overall, it is still fairly fibrotic. There is some slough on the surface today. 02/18/2023: The moisture balance and surface consistency have both improved. There is some slough over granulation tissue. Edema control is good. 02/24/2023: The wound measured smaller today. Moisture balance is good. Slough has reaccumulated. Edema remains well-controlled. 03/03/2023: The wound is a little bit smaller today. Moisture balance is stable. Minimal slough accumulation. There is more granulation tissue filling in over the fibrotic surface. Edema is well-controlled. 03/10/2023: The wound is smaller again today. Moisture balance is good. Light slough  accumulation on the fibrotic surface. Edema is well-controlled. 03/17/2023: The wound is slightly smaller again today. There is minimal slough accumulation but the underlying surface remains quite fibrotic. Edema control is good. 03/24/2023: Although the wound measurements are the same today, visually it looks smaller. There is still a fairly fibrotic surface underneath and slough. Edema remains well-controlled. 03/31/2023: She has a new wound that was identified when her wraps and dressings were removed today. It is just to the midline of the anterior tibial surface. The fat layer is exposed. There is a little bit of eschar and dry skin along with some thin slough. The wound closer to her ankle measured smaller today. The surface continues to improve although portions of it still remain fibrotic. There is slough buildup present. 04/07/2023: The wound that was new last week has nearly closed, but remains open by perhaps about a millimeter. The wound closer to her ankle had no significant change based on measurements, but visually it looks smaller to me. It is a little bit dry today and the surface is still fibrotic. 04/14/2023: The more proximal wound has completely healed. The wound at her ankle is quite a bit smaller with substantial epithelialization. There is just a small open portion of the distal aspect. It is covered with slough and eschar. 04/21/2023: The ankle wound is just the slightest bit smaller but it is shallower. There is slough and eschar accumulation. She has a new wound on her left posteromedial calf. It looks as though there was some friction or pinching, potentially from the Ace bandage. There is a bit of slough on the surface as well as some old blood. 04/28/2023: The ankle wound did not change in size this week. There is a little slough on the surface. The wound on her left posteromedial calf is covered with a layer of eschar. Underneath, it is smaller and clean. Edema control is  good. 05/05/2023: The ankle wound has contracted quite a bit this week. It is extremely superficial  with just a little thin eschar around the edges. The left posteromedial calf wound is healed. She had a small pustule just distal to this that was marked as a new wound, but I am not sure that the epidermis is actually violated here. 05/12/2023: The tiny calf wound that was new last week has healed. The ankle wound is smaller again today and very superficial, but has not yet completely closed. Edema control is good. 05/19/2023: Unfortunately, the ankle wound enlarged over the past week. No malodor or purulent drainage to suggest infection as the etiology and her edema control remains good. 05/26/2023: The ankle wound has contracted since her last visit. The surface has a bit of slough on it. Edema control is good. 06/02/2023: No significant change to the wound this week. 06/23/2023: The wound measured smaller this week. The surface is a little bit less fibrotic and there are more emerging buds of granulation tissue. Minimal slough. TRYSTEN, ROHAL ([id number]) [id number] Page 7 of 9 07/02/2023: The wound is about the same size this week, but the quality of the tissue surface has improved. There is some slough accumulation. Objective Constitutional Hypertensive, asymptomatic. no acute distress. Vitals Time Taken: 10:47 AM, Height: 60 in, Weight: 138 lbs, BMI: 26.9, Temperature: 98.2 F, Pulse: 72 bpm, Respiratory Rate: 18 breaths/min, Blood Pressure: 186/78 mmHg. Respiratory Normal work of breathing on room air.. General Notes: 07/02/2023: The wound is about the same size this week, but the quality of the tissue surface has improved. There is some slough accumulation. Integumentary (Hair, Skin) Wound #1 status is Open. Original cause of wound was Gradually Appeared. The date acquired was: 08/29/2022. The wound has been in treatment 31 weeks. The wound is located on  the Left,Medial Lower Leg. The wound measures 0.7cm length x 0.7cm width x 0.1cm depth; 0.385cm^2 area and 0.038cm^3 volume. There is Fat Layer (Subcutaneous Tissue) exposed. There is no tunneling or undermining noted. There is a medium amount of serosanguineous drainage noted. The wound margin is flat and intact. There is large (67-100%) red granulation within the wound bed. There is a small (1-33%) amount of necrotic tissue within the wound bed including Adherent Slough. The periwound skin appearance had no abnormalities noted for texture. The periwound skin appearance had no abnormalities noted for moisture. The periwound skin appearance exhibited: Hemosiderin Staining. Periwound temperature was noted as No Abnormality. The periwound has tenderness on palpation. 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 There was a Excisional Skin/Subcutaneous Tissue Debridement with a total area of 0.38 sq cm performed by Duanne Guess, MD. With the following instrument(s): Curette to remove Non-Viable tissue/material. Material removed includes Subcutaneous Tissue and Slough and after achieving pain control using Lidocaine 4% T opical Solution. No specimens were taken. A time out was conducted at 11:20, prior to the start of the procedure. A Minimum amount of bleeding was controlled with Pressure. The procedure was tolerated well. Post Debridement Measurements: 0.7cm length x 0.7cm width x 0.1cm depth; 0.038cm^3 volume. Character of Wound/Ulcer Post Debridement is improved. Severity of Tissue Post Debridement is: Fat layer exposed. Post  procedure Diagnosis Wound #1: Same as Pre-Procedure Plan Follow-up Appointments: Return Appointment in 1 week. - Dr  Adante Courington - 1/7 @ 3:00pm Anesthetic: (In clinic) Topical Lidocaine 4% applied to wound bed Bathing/ Shower/ Hygiene: May shower with protection but do not get wound dressing(s) wet. Protect dressing(s) with water repellant cover (for example, large plastic bag) or a cast cover and may then take shower. Edema Control - Orders / Instructions: Elevate legs to the level of the heart or above for 30 minutes daily and/or when sitting for 3-4 times a day throughout the day. Avoid standing for long periods of time. Exercise regularly WOUND #1: - Lower Leg Wound Laterality: Left, Medial Cleanser: Soap and Water 1 x Per Week/30 Days Discharge Instructions: May shower and wash wound with dial antibacterial soap and water prior to dressing change. Cleanser: Wound Cleanser 1 x Per Week/30 Days Discharge Instructions: Cleanse the wound with wound cleanser prior to applying a clean dressing using gauze sponges, not tissue or cotton balls. INEZE, SUSI ([id number]) [id number] Page 8 of 9 Peri-Wound Care: Triamcinolone 15 (g) 1 x Per Week/30 Days Discharge Instructions: Use triamcinolone 15 (g) as directed Peri-Wound Care: Sween Lotion (Moisturizing lotion) 1 x Per Week/30 Days Discharge Instructions: Apply moisturizing lotion as directed Topical: Gentamicin 1 x Per Week/30 Days Discharge Instructions: As directed by physician Topical: Mupirocin Ointment 1 x Per Week/30 Days Discharge Instructions: Apply Mupirocin (Bactroban) as instructed Topical: Skintegrity Hydrogel 4 (oz) 1 x Per Week/30 Days Discharge Instructions: Apply hydrogel as directed Prim Dressing: Promogran Prisma Matrix, 4.34 (sq in) (silver collagen) 1 x Per Week/30 Days ary Discharge Instructions: Moisten collagen with saline or hydrogel Com pression Wrap: Kerlix Roll 4.5x3.1 (in/yd) 1 x Per  Week/30 Days Discharge Instructions: Apply Kerlix and Coban compression as directed. Com pression Wrap: Coban Self-Adherent Wrap 4x5 (in/yd) 1 x Per Week/30 Days Discharge Instructions: Apply over Kerlix as directed. 07/02/2023: The wound is about the same size this week, but the quality of the tissue surface has improved. There is some slough accumulation. I used a curette to debride slough, eschar, and subcutaneous tissue from the wound. We will continue the mixture of topical gentamicin and mupirocin to continue suppression of skin flora and prevent infection. Continue Prisma silver collagen covered with Optifoam to maintain adequate moisture balance. Kerlix and Coban wrap. Follow-up in 1 week. Electronic Signature(s) Signed: 07/02/2023 3:30:05 PM By: Shawn Stall RN, BSN Signed: 07/02/2023 3:44:17 PM By: Duanne Guess MD FACS Previous Signature: 07/02/2023 12:38:46 PM Version By: Duanne Guess MD FACS Entered By: Shawn Stall on 07/02/2023 11:36:09 -------------------------------------------------------------------------------- SuperBill Details Patient Name: Date of Service: [name], [name] 07/02/2023 Medical Record Number: [id number] Patient Account Number: [id number] Date of Birth/Sex: Treating RN: June 11, 1941 (82 y.o. F) Primary Care Provider: [name] Other Clinician: Referring Provider: Treating Provider/Extender: [name], [name] in Treatment: 31 Diagnosis Coding ICD-10 Codes Code Description [phone] Non-pressure chronic ulcer of other part of left lower leg with fat layer exposed I87.332 Chronic venous hypertension (idiopathic) with ulcer and inflammation of left lower extremity I10 Essential (primary) hypertension Facility Procedures : CPT4 Code: [id number] Description: 11042 - DEB SUBQ TISSUE 20 SQ CM/< ICD-10 Diagnosis Description L97.822 Non-pressure chronic ulcer of other part of left lower leg with fat layer  expo Modifier: sed Quantity: 1 Physician Procedures : CPT4 Code Description Modifier [id number] 99214 - WC PHYS LEVEL 4 - EST PT ICD-10 Diagnosis Description L97.822 Non-pressure chronic ulcer of other part of left lower leg with fat layer exposed I87.332 Chronic venous hypertension (idiopathic) with ulcer  and inflammation of  left lower extremity I10 Essential (primary) hypertension Quantity: 1 : [phone] 11042 - WC PHYS SUBQ TISS 20 SQ CM ICD-10 Diagnosis Description DAFNE, TORRE ([id number]) [phone] Non-pressure chronic ulcer of other part of left lower leg with fat layer exposed Quantity: 1 7.pdf Page 9 of 9 Electronic Signature(s) Signed: 07/02/2023 12:38:46 PM By: Duanne Guess MD FACS Entered By: Duanne Guess on 07/02/2023 08:27:12

## 2023-07-02 NOTE — Progress Notes (Addendum)
Erin [name] ([id number]) [id number] Page 1 of 8 Visit Report for 07/02/2023 Arrival Information Details Patient Name: Date of Service: [name], [name] RBA RA N. 07/02/2023 10:45 A M Medical Record Number: [id number] Patient Account Number: [id number] Date of Birth/Sex: Treating RN: 1940/07/15 (82 y.o. Female) Primary Care Pegah Segel: Chilton Greathouse R Other Clinician: Referring Rhylin Venters: Treating Ayuub Penley/Extender: [name] Weeks in Treatment: 31 Visit Information History Since Last Visit Added or deleted any medications: No Patient Arrived: Ambulatory Any new allergies or adverse reactions: No Arrival Time: 10:46 Had a fall or experienced change in No Accompanied By: granddaughter activities of daily living that may affect Transfer Assistance: None risk of falls: Patient Identification Verified: Yes Signs or symptoms of abuse/neglect since last visito No Secondary Verification Process Completed: Yes Hospitalized since last visit: No Patient Requires Transmission-Based Precautions: No Implantable device outside of the clinic excluding No Patient Has Alerts: No cellular tissue based products placed in the center since last visit: Pain Present Now: No Electronic Signature(s) Signed: 07/02/2023 11:20:21 AM By: Dayton Scrape Entered By: Dayton Scrape on 07/02/2023 07:47:19 -------------------------------------------------------------------------------- Complex / Palliative Patient Assessment Details Patient Name: Date of Service: [name], [name] 07/02/2023 10:45 A M Medical Record Number: [id number] Patient Account Number: [id number] Date of Birth/Sex: Treating RN: 10/28/1940 (82 y.o. Female) Erin [name] Primary Care Jeremaih Klima: [name] Other Clinician: Referring Dannon Perlow: Treating Tajanae Guilbault/Extender: [name] Weeks in Treatment: 31 Complex Wound Management Criteria Patient has  remarkable or complex co-morbidities requiring medications or treatments that extend wound healing times. Examples: Diabetes mellitus with chronic renal failure or end stage renal disease requiring dialysis Advanced or poorly controlled rheumatoid arthritis Diabetes mellitus and end stage chronic obstructive pulmonary disease Active cancer with current chemo- or radiation therapy HTN, PVD, CKD, gout Palliative Wound Management Criteria Care Approach Wound Care Plan: Complex Wound Management Electronic Signature(s) Signed: 07/06/2023 2:09:58 PM By: Duanne Guess MD FACS Signed: 07/06/2023 4:22:01 PM By: Erin Deed RN, BSN Entered By: Erin [name] on 07/06/2023 11:02:18 Erin [name] ([id number]) [id number] Page 2 of 8 -------------------------------------------------------------------------------- Encounter Discharge Information Details Patient Name: Date of Service: [name], [name] RBA RA N. 07/02/2023 10:45 A M Medical Record Number: [id number] Patient Account Number: [id number] Date of Birth/Sex: Treating RN: March 27, 1941 (82 y.o. Female) Erin [name] Primary Care Kammi Hechler: [name] Other Clinician: Referring Oval Moralez: Treating [name]/Extender: [name] Weeks in Treatment: 6 Encounter Discharge Information Items Post Procedure Vitals Discharge Condition: Stable Temperature (F): 98.2 Ambulatory Status: Ambulatory Pulse (bpm): 72 Discharge Destination: Home Respiratory Rate (breaths/min): 18 Transportation: Private Auto Blood Pressure (mmHg): 186/78 Accompanied By: granddaughter Schedule Follow-up Appointment: Yes Clinical Summary of Care: Patient Declined Electronic Signature(s) Signed: 07/02/2023 3:45:54 PM By: Erin Pulling RN, BSN Entered By: Erin [name] on 07/02/2023 08:32:57 -------------------------------------------------------------------------------- Lower Extremity Assessment  Details Patient Name: Date of Service: [name], [name] 07/02/2023 10:45 A M Medical Record Number: [id number] Patient Account Number: [id number] Date of Birth/Sex: Treating RN: 06/04/1941 (82 y.o. Female) Erin [name] Primary Care Kamaury Cutbirth: [name] Other Clinician: Referring Jabe Jeanbaptiste: Treating Keil Pickering/Extender: [name], [name] in Treatment: 31 Edema Assessment Assessed: [Left: No] [Right: No] Edema: [Left: Ye] [Right: s] Calf Left: Right: Point of Measurement: From Medial Instep 28.8 cm Ankle Left: Right: Point of Measurement: From Medial Instep 17 cm Vascular Assessment Pulses: Dorsalis Pedis Palpable: [Left:Yes] Extremity colors, hair growth, and conditions: Extremity Color: [Left:Hyperpigmented] Hair  Growth on Extremity: [Left:No] Temperature of Extremity: [Left:Warm < 3 seconds] Electronic Signature(s) Signed: 07/02/2023 3:45:54 PM By: Erin Pulling RN, BSN Entered By: Erin [name] on 07/02/2023 07:57:09 [name], [name] ([id number]) [id number] Page 3 of 8 -------------------------------------------------------------------------------- Multi Wound Chart Details Patient Name: Date of Service: [name], [name] RBA RA N. 07/02/2023 10:45 A M Medical Record Number: [id number] Patient Account Number: [id number] Date of Birth/Sex: Treating RN: 10-08-1940 (82 y.o. Female) Primary Care Kyan Yurkovich: Chilton Greathouse R Other Clinician: Referring Verneta Hamidi: Treating Michel Hendon/Extender: [name], [name] in Treatment: 31 Vital Signs Height(in): 60 Pulse(bpm): 72 Weight(lbs): 138 Blood Pressure(mmHg): 186/78 Body Mass Index(BMI): 26.9 Temperature(F): 98.2 Respiratory Rate(breaths/min): 18 [1:Photos:] [N/A:N/A] Left, Medial Lower Leg N/A N/A Wound Location: Gradually Appeared N/A N/A Wounding Event: Venous Leg Ulcer N/A N/A Primary Etiology: Cataracts, Arrhythmia, Hypertension, N/A  N/A Comorbid History: Peripheral Venous Disease, Gout, Osteoarthritis 08/29/2022 N/A N/A Date Acquired: 26 N/A N/A Weeks of Treatment: Open N/A N/A Wound Status: No N/A N/A Wound Recurrence: 0.7x0.7x0.1 N/A N/A Measurements L x W x D (cm) 0.385 N/A N/A A (cm) : rea 0.038 N/A N/A Volume (cm) : 93.40% N/A N/A % Reduction in A rea: 93.50% N/A N/A % Reduction in Volume: Full Thickness Without Exposed N/A N/A Classification: Support Structures Medium N/A N/A Exudate A mount: Serosanguineous N/A N/A Exudate Type: red, brown N/A N/A Exudate Color: Flat and Intact N/A N/A Wound Margin: Large (67-100%) N/A N/A Granulation A mount: Red N/A N/A Granulation Quality: Small (1-33%) N/A N/A Necrotic A mount: Fat Layer (Subcutaneous Tissue): Yes N/A N/A Exposed Structures: Fascia: No Tendon: No Muscle: No Joint: No Bone: No Small (1-33%) N/A N/A Epithelialization: Debridement - Excisional N/A N/A Debridement: Pre-procedure Verification/Time Out 11:20 N/A N/A Taken: Lidocaine 4% Topical Solution N/A N/A Pain Control: Subcutaneous, Slough N/A N/A Tissue Debrided: Skin/Subcutaneous Tissue N/A N/A Level: 0.38 N/A N/A Debridement A (sq cm): rea Curette N/A N/A Instrument: Minimum N/A N/A Bleeding: Pressure N/A N/A Hemostasis A chieved: Procedure was tolerated well N/A N/A Debridement Treatment Response: 0.7x0.7x0.1 N/A N/A Post Debridement Measurements L x W x D (cm) Erin [name], Erin [name] ([id number]) [id number] Page 4 of 8 0.038 N/A N/A Post Debridement Volume: (cm) No Abnormalities Noted N/A N/A Periwound Skin Texture: No Abnormalities Noted N/A N/A Periwound Skin Moisture: Hemosiderin Staining: Yes N/A N/A Periwound Skin Color: No Abnormality N/A N/A Temperature: Yes N/A N/A Tenderness on Palpation: Debridement N/A N/A Procedures Performed: Treatment Notes Electronic Signature(s) Signed: 07/02/2023 12:38:46 PM By: Duanne Guess MD FACS Entered By: Duanne Guess on 07/02/2023 08:22:57 -------------------------------------------------------------------------------- Multi-Disciplinary Care Plan Details Patient Name: Date of Service: [name], [name] 07/02/2023 10:45 A M Medical Record Number: [id number] Patient Account Number: [id number] Date of Birth/Sex: Treating RN: 01-02-1941 (82 y.o. Female) Erin [name] Primary Care Annora Guderian: [name] Other Clinician: Referring Jozelynn Danielson: Treating Shaida Route/Extender: [name] in Treatment: 31 Multidisciplinary Care Plan reviewed with physician Active Inactive Necrotic Tissue Nursing Diagnoses: Impaired tissue integrity related to necrotic/devitalized tissue Knowledge deficit related to management of necrotic/devitalized tissue Goals: Necrotic/devitalized tissue will be minimized in the wound bed Date Initiated: 11/26/2022 Target Resolution Date: 07/03/2023 Goal Status: Active Patient/caregiver will verbalize understanding of reason and process for debridement of necrotic tissue Date Initiated: 11/26/2022 Date Inactivated: 01/20/2023 Target Resolution Date: 03/06/2023 Goal Status: Met Interventions: Assess patient pain level pre-, during and post procedure and prior to discharge Provide education on necrotic tissue and debridement process Treatment Activities: Apply topical  anesthetic as ordered : 11/26/2022 Notes: Wound/Skin Impairment Nursing Diagnoses: Impaired tissue integrity Knowledge deficit related to ulceration/compromised skin integrity Goals: Patient/caregiver will verbalize understanding of skin care regimen Date Initiated: 11/26/2022 Target Resolution Date: 07/03/2023 Goal Status: Active Interventions: Assess ulceration(s) every visit Treatment Activities: Skin care regimen initiated : 11/26/2022 Topical wound management initiated : 11/26/2022 Erin [name], Erin [name] ([id number])  [id number] Page 5 of 8 Notes: Electronic Signature(s) Signed: 07/02/2023 3:45:54 PM By: Erin Pulling RN, BSN Entered By: Erin [name] on 07/02/2023 07:57:58 -------------------------------------------------------------------------------- Pain Assessment Details Patient Name: Date of Service: [name], [name] 07/02/2023 10:45 A M Medical Record Number: [id number] Patient Account Number: [id number] Date of Birth/Sex: Treating RN: 1940/12/24 (82 y.o. Female) Primary Care Issac Moure: [name] Other Clinician: Referring Omnia Dollinger: Treating Bora Bost/Extender: [name], [name] in Treatment: 54 Active Problems Location of Pain Severity and Description of Pain Patient Has Paino No Site Locations Pain Management and Medication Current Pain Management: Electronic Signature(s) Signed: 07/02/2023 11:20:21 AM By: Dayton Scrape Entered By: Dayton Scrape on 07/02/2023 07:47:48 -------------------------------------------------------------------------------- Patient/Caregiver Education Details Patient Name: Date of Service: [name], [name] 12/26/2024andnbsp10:45 A M Medical Record Number: [id number] Patient Account Number: [id number] Date of Birth/Gender: Treating RN: [birth year] (82 y.o. Female) Erin [name] Primary Care Physician: [name] Other Clinician: Referring Physician: Treating Physician/Extender: [name] in Treatment: [address] [place], GONDER Causey ([id number]) [id number] Page 6 of 8 Education Provided To: Patient Education Topics Provided Wound/Skin Impairment: Methods: Explain/Verbal Responses: State content correctly Electronic Signature(s) Signed: 07/02/2023 3:45:54 PM By: Erin Pulling RN, BSN Entered By: Erin [name] on 07/02/2023  07:58:13 -------------------------------------------------------------------------------- Wound Assessment Details Patient Name: Date of Service: [name], [name] 07/02/2023 10:45 A M Medical Record Number: [id number] Patient Account Number: [id number] Date of Birth/Sex: Treating RN: January 22, 1941 (82 y.o. Female) Primary Care Faye Sanfilippo: Chilton Greathouse R Other Clinician: Referring Telvin Reinders: Treating Dannis Deroche/Extender: [name], [name] in Treatment: 31 Wound Status Wound Number: 1 Primary Venous Leg Ulcer Etiology: Wound Location: Left, Medial Lower Leg Wound Open Wounding Event: Gradually Appeared Status: Date Acquired: 08/29/2022 Comorbid Cataracts, Arrhythmia, Hypertension, Peripheral Venous Weeks Of Treatment: 31 History: Disease, Gout, Osteoarthritis Clustered Wound: No Photos Wound Measurements Length: (cm) 0.7 Width: (cm) 0.7 Depth: (cm) 0.1 Area: (cm) 0.385 Volume: (cm) 0.038 % Reduction in Area: 93.4% % Reduction in Volume: 93.5% Epithelialization: Small (1-33%) Tunneling: No Undermining: No Wound Description Classification: Full Thickness Without Exposed Support Wound Margin: Flat and Intact Exudate Amount: Medium Exudate Type: Serosanguineous Exudate Color: red, brown Structures Foul Odor After Cleansing: No Slough/Fibrino Yes Wound Bed Granulation Amount: Large (67-100%) Exposed Structure Granulation Quality: Red Fascia Exposed: No Necrotic Amount: Small (1-33%) Fat Layer (Subcutaneous Tissue) Exposed: Yes Necrotic Quality: Adherent Slough Tendon Exposed: No Muscle Exposed: No Erin [name], Erin [name] ([id number]) [id number] Page 7 of 8 Joint Exposed: No Bone Exposed: No Periwound Skin Texture Texture Color No Abnormalities Noted: Yes No Abnormalities Noted: No Hemosiderin Staining: Yes Moisture No Abnormalities Noted: Yes Temperature / Pain Temperature: No Abnormality Tenderness on Palpation:  Yes Treatment Notes Wound #1 (Lower Leg) Wound Laterality: Left, Medial Cleanser Soap and Water Discharge Instruction: May shower and wash wound with dial antibacterial soap and water prior to dressing change. Wound Cleanser Discharge Instruction: Cleanse the wound with wound cleanser prior to applying a clean dressing using gauze sponges, not tissue or cotton balls. Peri-Wound Care Triamcinolone 15 (g) Discharge Instruction: Use triamcinolone  15 (g) as directed Sween Lotion (Moisturizing lotion) Discharge Instruction: Apply moisturizing lotion as directed Topical Gentamicin Discharge Instruction: As directed by physician Mupirocin Ointment Discharge Instruction: Apply Mupirocin (Bactroban) as instructed Skintegrity Hydrogel 4 (oz) Discharge Instruction: Apply hydrogel as directed Primary Dressing Promogran Prisma Matrix, 4.34 (sq in) (silver collagen) Discharge Instruction: Moisten collagen with saline or hydrogel Secondary Dressing Secured With Compression Wrap Kerlix Roll 4.5x3.1 (in/yd) Discharge Instruction: Apply Kerlix and Coban compression as directed. Coban Self-Adherent Wrap 4x5 (in/yd) Discharge Instruction: Apply over Kerlix as directed. Compression Stockings Add-Ons Electronic Signature(s) Signed: 07/02/2023 3:45:54 PM By: Erin Pulling RN, BSN Entered By: Erin [name] on 07/02/2023 07:57:41 -------------------------------------------------------------------------------- Vitals Details Patient Name: Date of Service: [name], [name] 07/02/2023 10:45 A M Medical Record Number: [id number] Patient Account Number: [id number] Date of Birth/Sex: Treating RN: [birth year] (82 y.o. Female) Primary Care Niomi Valent: [name] Other Clinician: Referring Dylin Breeden: Treating Lillyanne Bradburn/Extender: [name], Frederick Peers in Treatment: [address], [place] ([id number]) [id number] Page 8 of 8 Vital Signs Time Taken:  10:47 Temperature (F): 98.2 Height (in): 60 Pulse (bpm): 72 Weight (lbs): 138 Respiratory Rate (breaths/min): 18 Body Mass Index (BMI): 26.9 Blood Pressure (mmHg): 186/78 Reference Range: 80 - 120 mg / dl Electronic Signature(s) Signed: 07/02/2023 11:20:21 AM By: Dayton Scrape Entered By: Dayton Scrape on 07/02/2023 07:47:42

## 2023-07-07 ENCOUNTER — Ambulatory Visit (HOSPITAL_BASED_OUTPATIENT_CLINIC_OR_DEPARTMENT_OTHER): Payer: Medicare HMO | Admitting: General Surgery

## 2023-07-14 ENCOUNTER — Encounter (HOSPITAL_BASED_OUTPATIENT_CLINIC_OR_DEPARTMENT_OTHER): Payer: Medicare HMO | Attending: General Surgery | Admitting: General Surgery

## 2023-07-14 DIAGNOSIS — I10 Essential (primary) hypertension: Secondary | ICD-10-CM | POA: Insufficient documentation

## 2023-07-14 DIAGNOSIS — I87302 Chronic venous hypertension (idiopathic) without complications of left lower extremity: Secondary | ICD-10-CM | POA: Diagnosis not present

## 2023-07-14 DIAGNOSIS — I872 Venous insufficiency (chronic) (peripheral): Secondary | ICD-10-CM | POA: Diagnosis not present

## 2023-07-14 DIAGNOSIS — L97822 Non-pressure chronic ulcer of other part of left lower leg with fat layer exposed: Secondary | ICD-10-CM | POA: Insufficient documentation

## 2023-07-14 NOTE — Progress Notes (Signed)
 Erin [name], Erin [name] ([id number]) [id number] Page 1 of 7 Visit Report for 07/14/2023 Chief Complaint Document Details Patient Name: Date of Service: [name], [name] RBA RA N. 07/14/2023 3:00 PM Medical Record Number: [id number] Patient Account Number: [id number] Date of Birth/Sex: Treating RN: 1940/11/25 (83 y.o. F) Primary Care Provider: Janey Searcy SAUNDERS Other Clinician: Referring Provider: Treating Provider/Extender: Marolyn Delon Janey Searcy SAUNDERS Weeks in Treatment: 20 Information Obtained from: Patient Chief Complaint Patient presents for treatment of an open ulcer due to venous insufficiency Electronic Signature(s) Signed: 07/14/2023 4:15:48 PM By: Marolyn Delon MD FACS Entered By: Marolyn Delon on 07/14/2023 16:15:48 -------------------------------------------------------------------------------- HPI Details Patient Name: Date of Service: [name], [name] 07/14/2023 3:00 PM Medical Record Number: [id number] Patient Account Number: [id number] Date of Birth/Sex: Treating RN: 04-May-1941 (83 y.o. F) Primary Care Provider: Janey Searcy SAUNDERS Other Clinician: Referring Provider: Treating Provider/Extender: Marolyn Delon Janey Searcy SAUNDERS Weeks in Treatment: 61 History of Present Illness HPI Description: ADMISSION 11/26/2022 This is an 83 year old woman who was referred by her primary care provider for a persistent nonhealing wound on her left lower extremity. It has been present since February of this year. 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 I do not see any formal venous reflux studies in the electronic medical record. She is not diabetic. She does not smoke. Her PCP has been washing her leg each week and applying a Kerlix and Coban wrap, but has not performed any formal debridement and has not been using any sort of topical contact layer or  ointment. ABI in clinic was noncompressible, but she has a palpable pedal pulse. 12/10/2022: The wound is a bit smaller and cleaner today. There is still fairly heavy slough on the surface. Edema control is acceptable. 12/16/2022: The wound is measuring the slightest bit smaller today. There is still thick slough on the surface. Edema control is good. 12/23/2022: The wound is a little bit smaller again today. Still with fairly heavy slough accumulation. Edema control is good. She has a new small wound on the proximal portion of the same leg. It looks as though she may have developed a small blister over the weekend subsequently ruptured. There is minimal slough on the surface. 12/30/2022: The wound is about the same size, but substantially cleaner. There is some hypertrophic granulation tissue starting to emerge. The wound that was new last week has closed but she has a different new wound today on the lateral aspect of her left lower leg. It also appears to have been a blister that opened and ruptured. There is a little bit of slough on the surface. 01/06/2023: She has new wounds today. She has a wound on the PIP knuckle of her left third and fourth toes, as well as an anterior tibial wound. She says that the wrap was too tight and it also looks like when she pushes her foot into her shoe, it shifts the wrapping back to her midfoot causing bunching up of the wrap material and pain. The lateral leg wounds are both smaller with a little slough and eschar present. The original medial leg wound is smaller and more superficial. There is slough on the surface. 01/13/2023: She has new wounds today. The anterior tibial wound has a satellite lesion that has opened up adjacent to it. Has additional wounds on her fifth toe in addition to the existing ones on her third and fourth toe.  The lateral leg wounds seem to have healed. The original medial leg wound is also smaller, but she and her daughter have several questions  regarding why we are not making forward progress, all of which are very reasonable. 01/20/2023: The wounds on her toes have healed. There is a tiny wound on the dorsum of her foot with a little bit of slough on it. The anterolateral leg wound has epithelialized quite substantially and the satellite that had opened last week is now closed. The medial leg wound measured narrower today. It has hypertrophic JAKI, STEPTOE ([id number]) [id number] Page 2 of 7 granulation tissue and slough present. 01/27/2023: The small wounds have all healed. The medial leg wound is smaller as well, with some slough on the surface but without reaccumulation of the hypertrophic granulation tissue. 02/03/2023: The only remaining open wound is the left medial leg wound. It is smaller and a bit more superficial. There is still slough accumulation on the surface. No hypertrophic granulation tissue. She had both her venous reflux and lower extremity arterial studies, both of which were essentially normal, although her TBI's are slightly diminished. 02/10/2023: The left medial leg wound has some granulation tissue filling in, but overall, it is still fairly fibrotic. There is some slough on the surface today. 02/18/2023: The moisture balance and surface consistency have both improved. There is some slough over granulation tissue. Edema control is good. 02/24/2023: The wound measured smaller today. Moisture balance is good. Slough has reaccumulated. Edema remains well-controlled. 03/03/2023: The wound is a little bit smaller today. Moisture balance is stable. Minimal slough accumulation. There is more granulation tissue filling in over the fibrotic surface. Edema is well-controlled. 03/10/2023: The wound is smaller again today. Moisture balance is good. Light slough accumulation on the fibrotic surface. Edema is well-controlled. 03/17/2023: The wound is slightly smaller again today. There is minimal slough  accumulation but the underlying surface remains quite fibrotic. Edema control is good. 03/24/2023: Although the wound measurements are the same today, visually it looks smaller. There is still a fairly fibrotic surface underneath and slough. Edema remains well-controlled. 03/31/2023: She has a new wound that was identified when her wraps and dressings were removed today. It is just to the midline of the anterior tibial surface. The fat layer is exposed. There is a little bit of eschar and dry skin along with some thin slough. The wound closer to her ankle measured smaller today. The surface continues to improve although portions of it still remain fibrotic. There is slough buildup present. 04/07/2023: The wound that was new last week has nearly closed, but remains open by perhaps about a millimeter. The wound closer to her ankle had no significant change based on measurements, but visually it looks smaller to me. It is a little bit dry today and the surface is still fibrotic. 04/14/2023: The more proximal wound has completely healed. The wound at her ankle is quite a bit smaller with substantial epithelialization. There is just a small open portion of the distal aspect. It is covered with slough and eschar. 04/21/2023: The ankle wound is just the slightest bit smaller but it is shallower. There is slough and eschar accumulation. She has a new wound on her left posteromedial calf. It looks as though there was some friction or pinching, potentially from the Ace bandage. There is a bit of slough on the surface as well as some old blood. 04/28/2023: The ankle wound did not change in size this week. There is a little slough  on the surface. The wound on her left posteromedial calf is covered with a layer of eschar. Underneath, it is smaller and clean. Edema control is good. 05/05/2023: The ankle wound has contracted quite a bit this week. It is extremely superficial with just a little thin eschar around the  edges. The left posteromedial calf wound is healed. She had a small pustule just distal to this that was marked as a new wound, but I am not sure that the epidermis is actually violated here. 05/12/2023: The tiny calf wound that was new last week has healed. The ankle wound is smaller again today and very superficial, but has not yet completely closed. Edema control is good. 05/19/2023: Unfortunately, the ankle wound enlarged over the past week. No malodor or purulent drainage to suggest infection as the etiology and her edema control remains good. 05/26/2023: The ankle wound has contracted since her last visit. The surface has a bit of slough on it. Edema control is good. 06/02/2023: No significant change to the wound this week. 06/23/2023: The wound measured smaller this week. The surface is a little bit less fibrotic and there are more emerging buds of granulation tissue. Minimal slough. 07/02/2023: The wound is about the same size this week, but the quality of the tissue surface has improved. There is some slough accumulation. 07/14/2023: Her wound is healed. Electronic Signature(s) Signed: 07/14/2023 4:16:18 PM By: Marolyn Nest MD FACS Entered By: Marolyn Nest on 07/14/2023 16:16:18 -------------------------------------------------------------------------------- Physical Exam Details Patient Name: Date of Service: [name], [name] 07/14/2023 3:00 PM Medical Record Number: [id number] Patient Account Number: [id number] Date of Birth/Sex: Treating RN: December 20, 1940 (83 y.o. F) Primary Care Provider: Janey Searcy SAUNDERS Other Clinician: Referring Provider: Treating Provider/Extender: Marolyn Nest Janey, Searcy SAUNDERS Weeks in Treatment: [address] [place] ([id number]) [id number] Page 3 of 7 Constitutional Hypertensive, asymptomatic. . . . no acute distress. Respiratory Normal work of breathing on room air.. Notes 07/14/2023: Her wound is  healed. Electronic Signature(s) Signed: 07/14/2023 4:17:38 PM By: Marolyn Nest MD FACS Entered By: Marolyn Nest on 07/14/2023 16:17:38 -------------------------------------------------------------------------------- Physician Orders Details Patient Name: Date of Service: [name], [name] 07/14/2023 3:00 PM Medical Record Number: [id number] Patient Account Number: [id number] Date of Birth/Sex: Treating RN: 04-05-1941 (83 y.o. Erin [name] Primary Care Provider: Janey Searcy SAUNDERS Other Clinician: Referring Provider: Treating Provider/Extender: Marolyn Nest Janey Searcy SAUNDERS Weeks in Treatment: 73 Verbal / Phone Orders: No Diagnosis Coding ICD-10 Coding Code Description [phone] Non-pressure chronic ulcer of other part of left lower leg with fat layer exposed I87.332 Chronic venous hypertension (idiopathic) with ulcer and inflammation of left lower extremity I10 Essential (primary) hypertension Discharge From [facility] Services Discharge from Wound Care Center - Congratulations! Call the Wound Care Center if needed in the future. Electronic Signature(s) Signed: 07/14/2023 4:18:26 PM By: Marolyn Nest MD FACS Entered By: Marolyn Nest on 07/14/2023 16:18:26 -------------------------------------------------------------------------------- Problem List Details Patient Name: Date of Service: [name], [name] 07/14/2023 3:00 PM Medical Record Number: [id number] Patient Account Number: [id number] Date of Birth/Sex: Treating RN: 05-21-1941 (83 y.o. Erin Merleen Handing Primary Care Provider: Avva, [name] Other Clinician: Referring Provider: Treating Provider/Extender: Marolyn Nest Janey Searcy SAUNDERS Weeks in Treatment: 31 Active Problems ICD-10 Encounter Code Description Active Date MDM Diagnosis [phone] Non-pressure chronic ulcer of other part of left lower leg with fat layer exposed5/22/2024 No Yes Erin [name], Erin [name] ([id number])  [phone] Page 4 of 7 [phone] Chronic venous hypertension (  idiopathic) with ulcer and inflammation of left 11/26/2022 No Yes lower extremity I10 Essential (primary) hypertension 11/26/2022 No Yes Inactive Problems Resolved Problems ICD-10 Code Description Active Date Resolved Date L97.222 Non-pressure chronic ulcer of left calf with fat layer exposed 04/21/2023 04/21/2023 L97.522 Non-pressure chronic ulcer of other part of left foot with fat layer exposed 01/13/2023 01/13/2023 Electronic Signature(s) Signed: 07/14/2023 4:13:55 PM By: Marolyn Nest MD FACS Entered By: Marolyn Nest on 07/14/2023 16:13:54 -------------------------------------------------------------------------------- Progress Note Details Patient Name: Date of Service: [name], [name] 07/14/2023 3:00 PM Medical Record Number: [id number] Patient Account Number: [id number] Date of Birth/Sex: Treating RN: [birth year] (83 y.o. F) Primary Care Provider: Janey Searcy SAUNDERS Other Clinician: Referring Provider: Treating Provider/Extender: Marolyn Nest Janey Searcy SAUNDERS Weeks in Treatment: 75 Subjective Chief Complaint Information obtained from Patient Patient presents for treatment of an open ulcer due to venous insufficiency History of Present Illness (HPI) ADMISSION 11/26/2022 This is an 83 year old woman who was referred by her primary care provider for a persistent nonhealing wound on her left lower extremity. It has been present since February of this year. 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 I do not see any formal venous reflux studies in the electronic medical record. She is not diabetic. She does not smoke. Her PCP has been washing her leg each week and applying a Kerlix and Coban wrap, but has not performed any formal debridement and has not been using any sort of topical contact  layer or ointment. ABI in clinic was noncompressible, but she has a palpable pedal pulse. 12/10/2022: The wound is a bit smaller and cleaner today. There is still fairly heavy slough on the surface. Edema control is acceptable. 12/16/2022: The wound is measuring the slightest bit smaller today. There is still thick slough on the surface. Edema control is good. 12/23/2022: The wound is a little bit smaller again today. Still with fairly heavy slough accumulation. Edema control is good. She has a new small wound on the proximal portion of the same leg. It looks as though she may have developed a small blister over the weekend subsequently ruptured. There is minimal slough on the surface. 12/30/2022: The wound is about the same size, but substantially cleaner. There is some hypertrophic granulation tissue starting to emerge. The wound that was new last week has closed but she has a different new wound today on the lateral aspect of her left lower leg. It also appears to have been a blister that opened and ruptured. There is a little bit of slough on the surface. 01/06/2023: She has new wounds today. She has a wound on the PIP knuckle of her left third and fourth toes, as well as an anterior tibial wound. She says that the wrap was too tight and it also looks like when she pushes her foot into her shoe, it shifts the wrapping back to her midfoot causing bunching up of the wrap material and pain. The lateral leg wounds are both smaller with a little slough and eschar present. The original medial leg wound is smaller and more superficial. There is slough on the surface. 01/13/2023: She has new wounds today. The anterior tibial wound has a satellite lesion that has opened up adjacent to it. Has additional wounds on her fifth toe in addition to the existing ones on her third and fourth toe. The lateral leg wounds seem to have healed. The original  medial leg wound is also smaller, but she and Erin [name], Erin [name]  ([id number]) [phone].pdf Page 5 of 7 her daughter have several questions regarding why we are not making forward progress, all of which are very reasonable. 01/20/2023: The wounds on her toes have healed. There is a tiny wound on the dorsum of her foot with a little bit of slough on it. The anterolateral leg wound has epithelialized quite substantially and the satellite that had opened last week is now closed. The medial leg wound measured narrower today. It has hypertrophic granulation tissue and slough present. 01/27/2023: The small wounds have all healed. The medial leg wound is smaller as well, with some slough on the surface but without reaccumulation of the hypertrophic granulation tissue. 02/03/2023: The only remaining open wound is the left medial leg wound. It is smaller and a bit more superficial. There is still slough accumulation on the surface. No hypertrophic granulation tissue. She had both her venous reflux and lower extremity arterial studies, both of which were essentially normal, although her TBI's are slightly diminished. 02/10/2023: The left medial leg wound has some granulation tissue filling in, but overall, it is still fairly fibrotic. There is some slough on the surface today. 02/18/2023: The moisture balance and surface consistency have both improved. There is some slough over granulation tissue. Edema control is good. 02/24/2023: The wound measured smaller today. Moisture balance is good. Slough has reaccumulated. Edema remains well-controlled. 03/03/2023: The wound is a little bit smaller today. Moisture balance is stable. Minimal slough accumulation. There is more granulation tissue filling in over the fibrotic surface. Edema is well-controlled. 03/10/2023: The wound is smaller again today. Moisture balance is good. Light slough accumulation on the fibrotic surface. Edema is well-controlled. 03/17/2023: The wound is slightly smaller again today. There is  minimal slough accumulation but the underlying surface remains quite fibrotic. Edema control is good. 03/24/2023: Although the wound measurements are the same today, visually it looks smaller. There is still a fairly fibrotic surface underneath and slough. Edema remains well-controlled. 03/31/2023: She has a new wound that was identified when her wraps and dressings were removed today. It is just to the midline of the anterior tibial surface. The fat layer is exposed. There is a little bit of eschar and dry skin along with some thin slough. The wound closer to her ankle measured smaller today. The surface continues to improve although portions of it still remain fibrotic. There is slough buildup present. 04/07/2023: The wound that was new last week has nearly closed, but remains open by perhaps about a millimeter. The wound closer to her ankle had no significant change based on measurements, but visually it looks smaller to me. It is a little bit dry today and the surface is still fibrotic. 04/14/2023: The more proximal wound has completely healed. The wound at her ankle is quite a bit smaller with substantial epithelialization. There is just a small open portion of the distal aspect. It is covered with slough and eschar. 04/21/2023: The ankle wound is just the slightest bit smaller but it is shallower. There is slough and eschar accumulation. She has a new wound on her left posteromedial calf. It looks as though there was some friction or pinching, potentially from the Ace bandage. There is a bit of slough on the surface as well as some old blood. 04/28/2023: The ankle wound did not change in size this week. There is a little slough on the surface. The wound on her left posteromedial calf  is covered with a layer of eschar. Underneath, it is smaller and clean. Edema control is good. 05/05/2023: The ankle wound has contracted quite a bit this week. It is extremely superficial with just a little thin  eschar around the edges. The left posteromedial calf wound is healed. She had a small pustule just distal to this that was marked as a new wound, but I am not sure that the epidermis is actually violated here. 05/12/2023: The tiny calf wound that was new last week has healed. The ankle wound is smaller again today and very superficial, but has not yet completely closed. Edema control is good. 05/19/2023: Unfortunately, the ankle wound enlarged over the past week. No malodor or purulent drainage to suggest infection as the etiology and her edema control remains good. 05/26/2023: The ankle wound has contracted since her last visit. The surface has a bit of slough on it. Edema control is good. 06/02/2023: No significant change to the wound this week. 06/23/2023: The wound measured smaller this week. The surface is a little bit less fibrotic and there are more emerging buds of granulation tissue. Minimal slough. 07/02/2023: The wound is about the same size this week, but the quality of the tissue surface has improved. There is some slough accumulation. 07/14/2023: Her wound is healed. Objective Constitutional Hypertensive, asymptomatic. no acute distress. Vitals Time Taken: 3:14 AM, Height: 60 in, Weight: 138 lbs, BMI: 26.9, Temperature: 98.1 F, Pulse: 73 bpm, Respiratory Rate: 18 breaths/min, Blood Pressure: 184/75 mmHg. Respiratory Normal work of breathing on room air.. General Notes: 07/14/2023: Her wound is healed. Erin [name], Erin [name] ([id number]) [id number] Page 6 of 7 Integumentary (Hair, Skin) Wound #1 status is Healed - Epithelialized. Original cause of wound was Gradually Appeared. The date acquired was: 08/29/2022. The wound has been in treatment 32 weeks. The wound is located on the Left,Medial Lower Leg. The wound measures 0cm length x 0cm width x 0cm depth; 0cm^2 area and 0cm^3 volume. There is no tunneling or undermining noted. There is a none present amount of  drainage noted. The wound margin is flat and intact. There is no granulation within the wound bed. There is no necrotic tissue within the wound bed. The periwound skin appearance had no abnormalities noted for texture. The periwound skin appearance had no abnormalities noted for moisture. The periwound skin appearance did not exhibit: Hemosiderin Staining. Periwound temperature was noted as No Abnormality. The periwound has tenderness on palpation. Assessment Active Problems ICD-10 Non-pressure chronic ulcer of other part of left lower leg with fat layer exposed Chronic venous hypertension (idiopathic) with ulcer and inflammation of left lower extremity Essential (primary) hypertension Plan Discharge From [facility] Services: Discharge from Wound Care Center - Congratulations! Call the Wound Care Center if needed in the future. 07/14/2023: Her wound is healed. As it has only just closed, I would like her to keep the area covered with a Band-Aid or similar dressing for the next couple of weeks to protect it from trauma and avoid reopening the site. We will discharge her from the wound care center. She may follow-up in the future, should the need arise. Electronic Signature(s) Signed: 07/14/2023 4:21:34 PM By: Marolyn Nest MD FACS Entered By: Marolyn Nest on 07/14/2023 16:21:34 -------------------------------------------------------------------------------- SuperBill Details Patient Name: Date of Service: [name], [name] 07/14/2023 Medical Record Number: [id number] Patient Account Number: [id number] Date of Birth/Sex: Treating RN: 03-01-1941 (83 y.o. Erin [name] Primary Care Provider: Janey Searcy SAUNDERS Other Clinician: Referring Provider: Treating Provider/Extender:  Marolyn Nest Avva, [name] in Treatment: 32 Diagnosis Coding ICD-10 Codes Code Description [phone] Non-pressure chronic ulcer of other part of left lower leg with fat layer exposed I87.332 Chronic  venous hypertension (idiopathic) with ulcer and inflammation of left lower extremity I10 Essential (primary) hypertension Facility Procedures : CPT4 Code: [id number] Description: 99213 - WOUND CARE VISIT-LEV 3 EST PT Modifier: Quantity: 1 Physician Procedures : CPT4 Code Description Modifier [id number] - WC PHYS LEVEL 2 - EST PT ICD-10 Diagnosis Description L97.822 Non-pressure chronic ulcer of other part of left lower leg with fat layer exposed Erin [name], Erin [name] ([id number])  [id number] P I87.332 Chronic venous hypertension (idiopathic) with ulcer and inflammation of left lower extremity I10 Essential (primary) hypertension Quantity: 1 age 39 of 6 Electronic Signature(s) Signed: 07/14/2023 4:26:45 PM By: Marolyn Nest MD FACS Entered By: Marolyn Nest on 07/14/2023 16:26:44

## 2023-07-16 NOTE — Progress Notes (Signed)
 Erin [name], Erin [name] ([id number]) [id number] Page 1 of 8 Visit Report for 07/14/2023 Arrival Information Details Patient Name: Date of Service: [name], [name] RBA RA N. 07/14/2023 3:00 PM Medical Record Number: [id number] Patient Account Number: [id number] Date of Birth/Sex: Treating [name]: 11-19-1940 (83 y.o. F) Primary Care Erin [name]: Erin [name] Other Clinician: Referring Erin [name]: Treating Erin [name]/Extender: Erin [name] Erin [name] Weeks in Treatment: 32 Visit Information History Since Last Visit Added or deleted any medications: No Patient Arrived: Ambulatory Any new allergies or adverse reactions: No Arrival Time: 15:13 Had a fall or experienced change in No Accompanied By: daughter activities of daily living that may affect Transfer Assistance: None risk of falls: Patient Identification Verified: Yes Signs or symptoms of abuse/neglect since last visito No Secondary Verification Process Completed: Yes Hospitalized since last visit: No Patient Requires Transmission-Based Precautions: No Implantable device outside of the clinic excluding No Patient Has Alerts: No cellular tissue based products placed in the center since last visit: Pain Present Now: No Electronic Signature(s) Signed: 07/15/2023 8:32:23 AM By: Erin [name] Entered By: Erin [name] on 07/14/2023 12:14:14 -------------------------------------------------------------------------------- Clinic Level of Care Assessment Details Patient Name: Date of Service: [name], [name] 07/14/2023 3:00 PM Medical Record Number: [id number] Patient Account Number: [id number] Date of Birth/Sex: Treating [name]: 11-20-1940 (83 y.o. Erin [name] Primary Care Erin [name]: Erin [name] Other Clinician: Referring Erin [name]: Treating Erin [name]/Extender: Erin [name] Erin [name] Weeks in Treatment: 32 Clinic Level of Care Assessment Items TOOL 4 Quantity Score X- 1 0 Use when only an  EandM is performed on FOLLOW-UP visit ASSESSMENTS - Nursing Assessment / Reassessment X- 1 10 Reassessment of Co-morbidities (includes updates in patient status) X- 1 5 Reassessment of Adherence to Treatment Plan ASSESSMENTS - Wound and Skin A ssessment / Reassessment X - Simple Wound Assessment / Reassessment - one wound 1 5 []  - 0 Complex Wound Assessment / Reassessment - multiple wounds []  - 0 Dermatologic / Skin Assessment (not related to wound area) ASSESSMENTS - Focused Assessment []  - 0 Circumferential Edema Measurements - multi extremities []  - 0 Nutritional Assessment / Counseling / Intervention []  - 0 Lower Extremity Assessment (monofilament, tuning fork, pulses) Erin [name] ([id number]) [phone].pdf Page 2 of 8 []  - 0 Peripheral Arterial Disease Assessment (using hand held doppler) ASSESSMENTS - Ostomy and/or Continence Assessment and Care []  - 0 Incontinence Assessment and Management []  - 0 Ostomy Care Assessment and Management (repouching, etc.) PROCESS - Coordination of Care X - Simple Patient / Family Education for ongoing care 1 15 []  - 0 Complex (extensive) Patient / Family Education for ongoing care X- 1 10 Staff obtains Chiropractor, Records, T Results / Process Orders est X- 1 10 Staff telephones HHA, Nursing Homes / Clarify orders / etc []  - 0 Routine Transfer to another Facility (non-emergent condition) []  - 0 Routine Hospital Admission (non-emergent condition) []  - 0 New Admissions / Manufacturing Engineer / Ordering NPWT Apligraf, etc. , []  - 0 Emergency Hospital Admission (emergent condition) X- 1 10 Simple Discharge Coordination []  - 0 Complex (extensive) Discharge Coordination PROCESS - Special Needs []  - 0 Pediatric / Minor Patient Management []  - 0 Isolation Patient Management []  - 0 Hearing / Language / Visual special needs []  - 0 Assessment of Community assistance (transportation, D/C planning,  etc.) []  - 0 Additional assistance / Altered mentation []  - 0 Support Surface(s) Assessment (bed, cushion, seat, etc.) INTERVENTIONS - Wound Cleansing / Measurement X - Simple Wound Cleansing -  one wound 1 5 []  - 0 Complex Wound Cleansing - multiple wounds X- 1 5 Wound Imaging (photographs - any number of wounds) []  - 0 Wound Tracing (instead of photographs) X- 1 5 Simple Wound Measurement - one wound []  - 0 Complex Wound Measurement - multiple wounds INTERVENTIONS - Wound Dressings []  - 0 Small Wound Dressing one or multiple wounds []  - 0 Medium Wound Dressing one or multiple wounds []  - 0 Large Wound Dressing one or multiple wounds []  - 0 Application of Medications - topical []  - 0 Application of Medications - injection INTERVENTIONS - Miscellaneous []  - 0 External ear exam []  - 0 Specimen Collection (cultures, biopsies, blood, body fluids, etc.) []  - 0 Specimen(s) / Culture(s) sent or taken to Lab for analysis []  - 0 Patient Transfer (multiple staff / Nurse, Adult / Similar devices) []  - 0 Simple Staple / Suture removal (25 or less) []  - 0 Complex Staple / Suture removal (26 or more) []  - 0 Hypo / Hyperglycemic Management (close monitor of Blood Glucose) []  - 0 Ankle / Brachial Index (ABI) - do not check if billed separately Erin [name], Erin [name] ([id number]) [phone].pdf Page 3 of 8 X- 1 5 Vital Signs Has the patient been seen at the hospital within the last three years: Yes Total Score: 85 Level Of Care: New/Established - Level 3 Electronic Signature(s) Signed: 07/14/2023 5:26:52 PM By: Erin [name] Entered By: Erin [name] on 07/14/2023 13:04:57 -------------------------------------------------------------------------------- Encounter Discharge Information Details Patient Name: Date of Service: [name], [name] 07/14/2023 3:00 PM Medical Record Number: [id number] Patient Account Number: [id number] Date of Birth/Sex: Treating  [name]: [birth year] (83 y.o. Erin [name] Primary Care Erin [name]: Erin [name] Other Clinician: Referring Erin [name]: Treating Erin [name]/Extender: Erin [name] Erin [name] Weeks in Treatment: 10 Encounter Discharge Information Items Discharge Condition: Stable Ambulatory Status: Ambulatory Discharge Destination: Home Transportation: Private Auto Accompanied By: Daughter Schedule Follow-up Appointment: Yes Clinical Summary of Care: Patient Declined Electronic Signature(s) Signed: 07/14/2023 5:26:52 PM By: Erin [name] Entered By: Erin [name] on 07/14/2023 14:03:00 -------------------------------------------------------------------------------- Lower Extremity Assessment Details Patient Name: Date of Service: [name], [name] 07/14/2023 3:00 PM Medical Record Number: [id number] Patient Account Number: [id number] Date of Birth/Sex: Treating [name]: October 31, 1940 (83 y.o. Erin [name] Primary Care Krislyn Donnan: Erin [name] Other Clinician: Referring Myreon Wimer: Treating Aritha Huckeba/Extender: Erin [name] Erin, [name] in Treatment: 32 Edema Assessment Assessed: [Left: No] [Right: No] Edema: [Left: Ye] [Right: s] Calf Left: Right: Point of Measurement: From Medial Instep 28.8 cm Ankle Left: Right: Point of Measurement: From Medial Instep 17 cm Vascular Assessment Pulses: Erin [name], Erin [name] ([id number]) [Right:[id number] Page 4 of 8] Dorsalis Pedis Palpable: [Left:Yes] Extremity colors, hair growth, and conditions: Extremity Color: [Left:Hyperpigmented] Hair Growth on Extremity: [Left:No] Temperature of Extremity: [Left:Warm < 3 seconds] Electronic Signature(s) Signed: 07/14/2023 5:26:52 PM By: Erin [name] Entered By: Erin [name] on 07/14/2023 12:53:00 -------------------------------------------------------------------------------- Multi Wound Chart Details Patient Name: Date of Service: [name], BA  RBA RA N. 07/14/2023 3:00 PM Medical Record Number: [id number] Patient Account Number: [id number] Date of Birth/Sex: Treating [name]: 05-Jan-1941 (83 y.o. F) Primary Care Merna Baldi: Erin [name] Other Clinician: Referring Beretta Ginsberg: Treating Kupono Marling/Extender: Erin [name] Erin, [name] in Treatment: 32 Vital Signs Height(in): 60 Pulse(bpm): 73 Weight(lbs): 138 Blood Pressure(mmHg): 184/75 Body Mass Index(BMI): 26.9 Temperature(F): 98.1 Respiratory Rate(breaths/min): 18 [1:Photos:] [N/A:N/A] Left, Medial Lower Leg N/A N/A Wound Location: Gradually Appeared N/A N/A Wounding Event:  Venous Leg Ulcer N/A N/A Primary Etiology: Cataracts, Arrhythmia, Hypertension, N/A N/A Comorbid History: Peripheral Venous Disease, Gout, Osteoarthritis 08/29/2022 N/A N/A Date Acquired: 80 N/A N/A Weeks of Treatment: Healed - Epithelialized N/A N/A Wound Status: No N/A N/A Wound Recurrence: 0x0x0 N/A N/A Measurements L x W x D (cm) 0 N/A N/A A (cm) : rea 0 N/A N/A Volume (cm) : 100.00% N/A N/A % Reduction in Area: 100.00% N/A N/A % Reduction in Volume: Full Thickness Without Exposed N/A N/A Classification: Support Structures None Present N/A N/A Exudate Amount: Flat and Intact N/A N/A Wound Margin: None Present (0%) N/A N/A Granulation Amount: None Present (0%) N/A N/A Necrotic Amount: Fascia: No N/A N/A Exposed Structures: Fat Layer (Subcutaneous Tissue): No Tendon: No Muscle: No Joint: No Bone: No Large (67-100%) N/A N/A Epithelialization: No Abnormalities Noted N/A N/A Periwound Skin Texture: No Abnormalities Noted N/A N/A Periwound Skin Moisture: Hemosiderin Staining: No N/A N/A Periwound Skin ColorADALAI, Erin [name] ([id number]) [phone].pdf Page 5 of 8 No Abnormality N/A N/A Temperature: Yes N/A N/A Tenderness on Palpation: Treatment Notes Electronic Signature(s) Signed: 07/14/2023 4:14:03 PM By: Erin Nest MD  FACS Entered By: Erin Nest on 07/14/2023 13:14:03 -------------------------------------------------------------------------------- Multi-Disciplinary Care Plan Details Patient Name: Date of Service: [name], [name] 07/14/2023 3:00 PM Medical Record Number: [id number] Patient Account Number: [id number] Date of Birth/Sex: Treating [name]: 1941/03/30 (83 y.o. Erin Merleen Handing Primary Care Makenzey Nanni: Erin [name] Other Clinician: Referring Malisa Ruggiero: Treating Derald Lorge/Extender: Erin Nest Erin [name] Devra in Treatment: 64 Multidisciplinary Care Plan reviewed with physician Active Inactive Electronic Signature(s) Signed: 07/14/2023 5:26:52 PM By: Erin [name] Signed: 07/15/2023 5:52:54 PM By: Merleen Handing [name], BSN Previous Signature: 07/14/2023 5:04:29 PM Version By: Merleen Handing [name], BSN Entered By: Erin [name] on 07/14/2023 14:04:43 -------------------------------------------------------------------------------- Pain Assessment Details Patient Name: Date of Service: [name], [name] 07/14/2023 3:00 PM Medical Record Number: [id number] Patient Account Number: [id number] Date of Birth/Sex: Treating [name]: [birth year] (83 y.o. F) Primary Care Milee Qualls: Erin [name] Other Clinician: Referring Marge Vandermeulen: Treating Taniah Reinecke/Extender: Erin Nest Erin, [name] in Treatment: 86 Active Problems Location of Pain Severity and Description of Pain Patient Has Paino No Site Locations [place], [facility] [place] ([id number]) [id number] Page 6 of 8 Pain Management and Medication Current Pain Management: Electronic Signature(s) Signed: 07/15/2023 8:32:23 AM By: Erin [name] Entered By: Erin [name] on 07/14/2023 12:15:12 -------------------------------------------------------------------------------- Patient/Caregiver Education Details Patient Name: Date of Service: [name], [name] 1/7/2025andnbsp3:00 PM Medical Record  Number: [id number] Patient Account Number: [id number] Date of Birth/Gender: Treating [name]: 02/27/1941 (83 y.o. Erin Merleen Handing Primary Care Physician: Erin [name] Other Clinician: Referring Physician: Treating Physician/Extender: Erin Nest Erin [name] Devra in Treatment: 53 Education Assessment Education Provided To: Patient Education Topics Provided Venous: Methods: Explain/Verbal Responses: Reinforcements needed, State content correctly Wound/Skin Impairment: Methods: Explain/Verbal Responses: Reinforcements needed, State content correctly Electronic Signature(s) Signed: 07/14/2023 5:04:29 PM By: Merleen Handing [name], BSN Entered By: Merleen Handing on 07/14/2023 12:20:36 -------------------------------------------------------------------------------- Wound Assessment Details Patient Name: Date of Service: [name], [name] 07/14/2023 3:00 PM Erin [name] ([id number]) [phone].pdf Page 7 of 8 Medical Record Number: [id number] Patient Account Number: [id number] Date of Birth/Sex: Treating [name]: 15-Oct-1940 (83 y.o. F) Primary Care Ramani Riva: Erin [name] Other Clinician: Referring Dezirea Mccollister: Treating Ily Denno/Extender: Erin Nest Erin, [name] in Treatment: 32 Wound Status Wound Number: 1 Primary Venous Leg Ulcer Etiology: Wound Location: Left, Medial Lower  Leg Wound Healed - Epithelialized Wounding Event: Gradually Appeared Status: Date Acquired: 08/29/2022 Comorbid Cataracts, Arrhythmia, Hypertension, Peripheral Venous Weeks Of Treatment: 32 History: Disease, Gout, Osteoarthritis Clustered Wound: No Photos Wound Measurements Length: (cm) Width: (cm) Depth: (cm) Area: (cm) Volume: (cm) 0 % Reduction in Area: 100% 0 % Reduction in Volume: 100% 0 Epithelialization: Large (67-100%) 0 Tunneling: No 0 Undermining: No Wound Description Classification: Full Thickness Without Exposed Support  Structures Wound Margin: Flat and Intact Exudate Amount: None Present Foul Odor After Cleansing: No Slough/Fibrino No Wound Bed Granulation Amount: None Present (0%) Exposed Structure Necrotic Amount: None Present (0%) Fascia Exposed: No Fat Layer (Subcutaneous Tissue) Exposed: No Tendon Exposed: No Muscle Exposed: No Joint Exposed: No Bone Exposed: No Periwound Skin Texture Texture Color No Abnormalities Noted: Yes No Abnormalities Noted: No Hemosiderin Staining: No Moisture No Abnormalities Noted: Yes Temperature / Pain Temperature: No Abnormality Tenderness on Palpation: Yes Electronic Signature(s) Signed: 07/14/2023 5:26:52 PM By: Erin [name] Entered By: Erin [name] on 07/14/2023 12:55:04 -------------------------------------------------------------------------------- Vitals Details Patient Name: Date of Service: [name], [name] 07/14/2023 3:00 PM Medical Record Number: [id number] Patient Account Number: [id number] Erin [name], Erin [name] ([id number]) [phone].pdf Page 8 of 8 Date of Birth/Sex: Treating [name]: 01-06-1941 (83 y.o. F) Primary Care Akira Adelsberger: Erin [name] Other Clinician: Referring [place] Throckmorton: Treating Adrie Picking/Extender: Erin [name] Erin, [name] in Treatment: 32 Vital Signs Time Taken: 03:14 Temperature (F): 98.1 Height (in): 60 Pulse (bpm): 73 Weight (lbs): 138 Respiratory Rate (breaths/min): 18 Body Mass Index (BMI): 26.9 Blood Pressure (mmHg): 184/75 Reference Range: 80 - 120 mg / dl Electronic Signature(s) Signed: 07/15/2023 8:32:23 AM By: Erin [name] Entered By: Erin [name] on 07/14/2023 12:14:58

## 2023-07-21 ENCOUNTER — Ambulatory Visit (HOSPITAL_BASED_OUTPATIENT_CLINIC_OR_DEPARTMENT_OTHER): Payer: Medicare HMO | Admitting: General Surgery

## 2023-07-28 ENCOUNTER — Ambulatory Visit (HOSPITAL_BASED_OUTPATIENT_CLINIC_OR_DEPARTMENT_OTHER): Payer: Medicare HMO | Admitting: General Surgery

## 2023-08-04 ENCOUNTER — Ambulatory Visit (HOSPITAL_BASED_OUTPATIENT_CLINIC_OR_DEPARTMENT_OTHER): Payer: Medicare HMO | Admitting: General Surgery

## 2023-10-12 DIAGNOSIS — R7301 Impaired fasting glucose: Secondary | ICD-10-CM | POA: Diagnosis not present

## 2023-10-12 DIAGNOSIS — D649 Anemia, unspecified: Secondary | ICD-10-CM | POA: Diagnosis not present

## 2023-10-12 DIAGNOSIS — E039 Hypothyroidism, unspecified: Secondary | ICD-10-CM | POA: Diagnosis not present

## 2023-10-12 DIAGNOSIS — E7849 Other hyperlipidemia: Secondary | ICD-10-CM | POA: Diagnosis not present

## 2023-10-12 DIAGNOSIS — I129 Hypertensive chronic kidney disease with stage 1 through stage 4 chronic kidney disease, or unspecified chronic kidney disease: Secondary | ICD-10-CM | POA: Diagnosis not present

## 2023-10-12 DIAGNOSIS — N1831 Chronic kidney disease, stage 3a: Secondary | ICD-10-CM | POA: Diagnosis not present

## 2023-10-12 DIAGNOSIS — E785 Hyperlipidemia, unspecified: Secondary | ICD-10-CM | POA: Diagnosis not present

## 2023-10-12 DIAGNOSIS — M109 Gout, unspecified: Secondary | ICD-10-CM | POA: Diagnosis not present

## 2023-10-19 DIAGNOSIS — E039 Hypothyroidism, unspecified: Secondary | ICD-10-CM | POA: Diagnosis not present

## 2023-10-19 DIAGNOSIS — I87332 Chronic venous hypertension (idiopathic) with ulcer and inflammation of left lower extremity: Secondary | ICD-10-CM | POA: Diagnosis not present

## 2023-10-19 DIAGNOSIS — D696 Thrombocytopenia, unspecified: Secondary | ICD-10-CM | POA: Diagnosis not present

## 2023-10-19 DIAGNOSIS — Z1339 Encounter for screening examination for other mental health and behavioral disorders: Secondary | ICD-10-CM | POA: Diagnosis not present

## 2023-10-19 DIAGNOSIS — Z853 Personal history of malignant neoplasm of breast: Secondary | ICD-10-CM | POA: Diagnosis not present

## 2023-10-19 DIAGNOSIS — Z1331 Encounter for screening for depression: Secondary | ICD-10-CM | POA: Diagnosis not present

## 2023-10-19 DIAGNOSIS — J309 Allergic rhinitis, unspecified: Secondary | ICD-10-CM | POA: Diagnosis not present

## 2023-10-19 DIAGNOSIS — Z0000 Encounter for general adult medical examination without abnormal findings: Secondary | ICD-10-CM | POA: Diagnosis not present

## 2023-10-19 DIAGNOSIS — R82998 Other abnormal findings in urine: Secondary | ICD-10-CM | POA: Diagnosis not present

## 2023-10-19 DIAGNOSIS — N823 Fistula of vagina to large intestine: Secondary | ICD-10-CM | POA: Diagnosis not present

## 2023-10-19 DIAGNOSIS — I129 Hypertensive chronic kidney disease with stage 1 through stage 4 chronic kidney disease, or unspecified chronic kidney disease: Secondary | ICD-10-CM | POA: Diagnosis not present

## 2023-10-19 DIAGNOSIS — N1831 Chronic kidney disease, stage 3a: Secondary | ICD-10-CM | POA: Diagnosis not present

## 2023-10-19 DIAGNOSIS — E785 Hyperlipidemia, unspecified: Secondary | ICD-10-CM | POA: Diagnosis not present

## 2023-11-11 ENCOUNTER — Telehealth: Payer: Self-pay

## 2023-11-11 DIAGNOSIS — I129 Hypertensive chronic kidney disease with stage 1 through stage 4 chronic kidney disease, or unspecified chronic kidney disease: Secondary | ICD-10-CM | POA: Diagnosis not present

## 2023-11-11 DIAGNOSIS — I87332 Chronic venous hypertension (idiopathic) with ulcer and inflammation of left lower extremity: Secondary | ICD-10-CM | POA: Diagnosis not present

## 2023-11-11 DIAGNOSIS — L03116 Cellulitis of left lower limb: Secondary | ICD-10-CM | POA: Diagnosis not present

## 2023-11-11 DIAGNOSIS — N1831 Chronic kidney disease, stage 3a: Secondary | ICD-10-CM | POA: Diagnosis not present

## 2023-11-11 NOTE — Telephone Encounter (Signed)
 Pt is sch for 11/16/2023 at 4 pm confirmed.

## 2023-11-11 NOTE — Telephone Encounter (Signed)
 Referral from Bertha Broad NP with [facility]. Called and lm on vm to offer appt on Monday 11/16/2023 at 4 pm. If this day does not work we can offer another time. Potential study candidate. Measurements from today's office visit with NP lateral posterior 1.0x1.3cm  x59mm, left medial 4.7x3.0 cm x0.74mm, left lateral 1.0x1cm .0x0.1 mm. Will hold and trypt again

## 2023-11-16 ENCOUNTER — Ambulatory Visit: Admitting: Orthopedic Surgery

## 2023-11-16 ENCOUNTER — Encounter: Payer: Self-pay | Admitting: Orthopedic Surgery

## 2023-11-16 DIAGNOSIS — I83028 Varicose veins of left lower extremity with ulcer other part of lower leg: Secondary | ICD-10-CM | POA: Diagnosis not present

## 2023-11-16 NOTE — Progress Notes (Signed)
 Office Visit Note   Patient: Erin [name]           Date of Birth: 1941/04/15           MRN: [id number] Visit Date: 11/16/2023              Requested by: Lonzie Robins, MD [address] [place],  [place] [address] PCP: Avva, Ravisankar, MD  Chief Complaint  Patient presents with   Left Leg - Wound Check      HPI: Patient is an 83 year old woman who is seen for initial evaluation with referral from [facility].  Patient has a left ankle venous ulcer.  Patient has been on doxycycline.  Patient has been to the wound center as well.  Patient has recurrent ulceration.  Assessment & Plan: Visit Diagnoses:  1. 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  Patient is placed in a Dynaflex compression wrap.  Reevaluate in 1 week for possible enrollment in the venous leg ulcer study.  Follow-Up Instructions: Return in about 1 week (around 11/23/2023).   Ortho Exam  Patient is alert, oriented, no adenopathy, well-dressed, normal affect, normal respiratory effort. Examination patient has a palpable dorsalis pedis pulse with the Doppler she has a triphasic dorsalis pedis pulse.  The wound has 75% fibrinous exudative tissue.  A 10 blade knife was used to remove fibrinous exudative tissue back to approximately 50% fibrinous exudative tissue.  The wound measures 2.5 x 4 cm.  Imaging: No results found. No images are attached to the encounter.  Labs: No results found for: "HGBA1C", "ESRSEDRATE", "CRP", "LABURIC", "REPTSTATUS", "GRAMSTAIN", "CULT", "LABORGA"   Lab Results  Component Value Date   ALBUMIN 3.7 08/11/2011   ALBUMIN 4.0 01/19/2006    No results found for: "MG" No results found for: "VD25OH"  No results found for: "PREALBUMIN"    Latest Ref Rng & Units 08/16/2011    4:43 AM 08/15/2011   11:44 AM 08/11/2011    9:45 AM  CBC EXTENDED  WBC 4.0 - 10.5 K/uL 14.4  17.8  12.0   RBC 3.87 - 5.11  MIL/uL 3.16  3.59  4.21   Hemoglobin 12.0 - 15.0 g/dL 9.9  62.1  30.8   HCT 65.7 - 46.0 % 29.0  32.7  39.8   Platelets 150 - 400 K/uL 101  116  118   NEUT# 1.7 - 7.7 K/uL   9.6   Lymph# 0.7 - 4.0 K/uL   1.6      There is no height or weight on file to calculate BMI.  Orders:  No orders of the defined types were placed in this encounter.  No orders of the defined types were placed in this encounter.    Procedures: No procedures performed  Clinical Data: No additional findings.  ROS:  All other systems negative, except as noted in the HPI. Review of Systems  Objective: Vital Signs: There were no vitals taken for this visit.  Specialty Comments:  No specialty comments available.  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 AVVA    Cancer (HCC)  breast ca   Degenerative joint disease    Diverticulosis    GERD (gastroesophageal reflux disease)    Hyperlipidemia    Hypertension    Hypothyroidism    PT TAKES LEVOTHYROXINE    Osteoarthritis    Osteoporosis    Thyroid  disease    hypothyroidism   Ulcer    peptic    History reviewed. No pertinent family history.  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 OR B/P'S RIGHT ARM   COLON RESECTION  08/15/2011   Procedure: HAND ASSISTED LAPAROSCOPIC COLON RESECTION;  Surgeon: Harlee Lichtenstein, MD;  Location: [facility];  Service: General;  Laterality: N/A;   CYSTOSCOPY W/ RETROGRADES  08/15/2011   Procedure:  CYSTOSCOPY WITH RETROGRADE PYELOGRAM;  Surgeon: Mark C Ottelin, MD;  Location: [facility];  Service: Urology;  Laterality: Left;  ureteral catheter insertion   gastric ulcer repair  1976   JOINT REPLACEMENT  2008   RIGHT TOTAL KNEE ARTHROPLASTY   MASTECTOMY Right 1998   SALPINGOOPHORECTOMY  08/15/2011   Procedure: SALPINGO OOPHERECTOMY;  Surgeon: Harlee Lichtenstein, MD;  Location: [facility];  Service: General;  Laterality: Left;   Social History   Occupational History   Not on file  Tobacco Use   Smoking status: Never   Smokeless tobacco: Never  Substance and Sexual Activity   Alcohol  use: No   Drug use: No   Sexual activity: Not on file

## 2023-11-23 ENCOUNTER — Ambulatory Visit (INDEPENDENT_AMBULATORY_CARE_PROVIDER_SITE_OTHER): Admitting: Orthopedic Surgery

## 2023-11-23 VITALS — BP 192/82 | HR 81 | Temp 98.2°F | Ht 60.0 in | Wt 137.6 lb

## 2023-11-23 DIAGNOSIS — I83028 Varicose veins of left lower extremity with ulcer other part of lower leg: Secondary | ICD-10-CM

## 2023-11-24 ENCOUNTER — Encounter: Payer: Self-pay | Admitting: Orthopedic Surgery

## 2023-11-24 NOTE — Progress Notes (Unsigned)
 Office Visit Note   Patient: Erin [name]           Date of Birth: [birth year]           MRN: [id number] Visit Date: 11/23/2023              Requested by: Lonzie Robins, MD [address] [place],  [place] [address] PCP: Lonzie Robins, MD  Chief Complaint  Patient presents with   Left Leg - Wound Check      HPI: Patient is an 83 year old woman with a venous insufficiency ulcer left lower extremity currently in a Dynaflex compression wrap.  Assessment & Plan: Visit Diagnoses:  1. Venous stasis ulcer of left lower leg (HCC)     Plan: Ulcer was debrided compression reapplied possible enrollment in the study.  Follow-Up Instructions: No follow-ups on file.   Ortho Exam  Patient is alert, oriented, no adenopathy, well-dressed, normal affect, normal respiratory effort. Examination patient has good wrinkling the skin no cellulitis.  The ulcer was debrided with 50% healthy bleeding granulation tissue.  Ulcer is 2 cm in diameter after debridement.  Imaging: No results found.   Labs: No results found for: "HGBA1C", "ESRSEDRATE", "CRP", "LABURIC", "REPTSTATUS", "GRAMSTAIN", "CULT", "LABORGA"   Lab Results  Component Value Date   ALBUMIN 3.7 08/11/2011   ALBUMIN 4.0 01/19/2006    No results found for: "MG" No results found for: "VD25OH"  No results found for: "PREALBUMIN"    Latest Ref Rng & Units 08/16/2011    4:43 AM 08/15/2011   11:44 AM 08/11/2011    9:45 AM  CBC EXTENDED  WBC 4.0 - 10.5 K/uL 14.4  17.8  12.0   RBC 3.87 - 5.11 MIL/uL 3.16  3.59  4.21   Hemoglobin 12.0 - 15.0 g/dL 9.9  10.2  72.5   HCT 36.6 - 46.0 % 29.0  32.7  39.8   Platelets 150 - 400 K/uL 101  116  118   NEUT# 1.7 - 7.7 K/uL   9.6   Lymph# 0.7 - 4.0 K/uL   1.6      Body mass index is 26.87 kg/m.  Orders:  No orders of the defined types were placed in this encounter.  No orders of the defined types were placed in this encounter.    Procedures: No procedures  performed  Clinical Data: No additional findings.  ROS:  All other systems negative, except as noted in the HPI. Review of Systems  Objective: Vital Signs: BP (!) 192/82   Pulse 81   Temp 98.2 F (36.8 C)   Ht 5' (1.524 m)   Wt 137 lb 9.6 oz (62.4 kg)   BMI 26.87 kg/m   Specialty Comments:  No specialty comments available.  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 AVVA    Cancer [facility])    breast ca   Degenerative joint disease    Diverticulosis    GERD (gastroesophageal reflux disease)    Hyperlipidemia    Hypertension    Hypothyroidism    PT TAKES LEVOTHYROXINE    Osteoarthritis    Osteoporosis    Thyroid  disease    hypothyroidism   Ulcer  peptic    History reviewed. No pertinent family history.  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 OR B/P'S RIGHT ARM   COLON RESECTION  08/15/2011   Procedure: HAND ASSISTED LAPAROSCOPIC COLON RESECTION;  Surgeon: Harlee Lichtenstein, MD;  Location: [facility];  Service: General;  Laterality: N/A;   CYSTOSCOPY W/ RETROGRADES  08/15/2011   Procedure: CYSTOSCOPY WITH RETROGRADE PYELOGRAM;  Surgeon: Mark C Ottelin, MD;  Location: [facility];  Service: Urology;  Laterality: Left;  ureteral catheter insertion   gastric ulcer repair  1976   JOINT REPLACEMENT  2008   RIGHT TOTAL KNEE ARTHROPLASTY   MASTECTOMY Right 1998   SALPINGOOPHORECTOMY  08/15/2011   Procedure: SALPINGO OOPHERECTOMY;  Surgeon: Harlee Lichtenstein, MD;   Location: [facility];  Service: General;  Laterality: Left;   Social History   Occupational History   Not on file  Tobacco Use   Smoking status: Never   Smokeless tobacco: Never  Substance and Sexual Activity   Alcohol  use: No   Drug use: No   Sexual activity: Not on file

## 2023-12-01 ENCOUNTER — Encounter: Payer: Self-pay | Admitting: Orthopedic Surgery

## 2023-12-01 ENCOUNTER — Ambulatory Visit: Admitting: Orthopedic Surgery

## 2023-12-01 DIAGNOSIS — I739 Peripheral vascular disease, unspecified: Secondary | ICD-10-CM

## 2023-12-01 DIAGNOSIS — I83028 Varicose veins of left lower extremity with ulcer other part of lower leg: Secondary | ICD-10-CM

## 2023-12-01 NOTE — Progress Notes (Signed)
 Office Visit Note   Patient: Erin [name]           Date of Birth: 1940-09-13           MRN: [id number] Visit Date: 12/01/2023              Requested by: Lonzie Robins, MD [address] [place],  [place] [address] PCP: Avva, Ravisankar, MD  Chief Complaint  Patient presents with   Left Leg - Follow-up      HPI: Patient is an 83 year old woman who presents in follow-up for posterior medial venous ulcer left leg.  Patient has been treated at the [facility] wound center for about 7 months.  Patient feels like she is having increased drainage.  Assessment & Plan: Visit Diagnoses:  1. Venous stasis ulcer of left lower leg (HCC)   2. PAD (peripheral artery disease) (HCC)     Plan: We will apply a multilayer compression and obtain a urgent ABI of vascular surgery.  I am concerned there is been a change in her circulation to the left lower extremity.  Follow-Up Instructions: Return in about 1 week (around 12/08/2023).   Ortho Exam  Patient is alert, oriented, no adenopathy, well-dressed, normal affect, normal respiratory effort. Examination the ulcer is larger after debridement there is 100% healthy granulation tissue it measures 2 x 4 cm with flat granulation tissue posterior medial aspect of the left ankle.  By Doppler patient has a monophasic dampened posterior tibial pulse and a dampened biphasic dorsalis pedis pulse.  Previous studies in July 2024 showed biphasic posterior tibial and multiphasic dorsalis pedis with ABI on the left that was noncompressible and a great toe pressure of 121.  Imaging: No results found.   Labs: No results found for: "HGBA1C", "ESRSEDRATE", "CRP", "LABURIC", "REPTSTATUS", "GRAMSTAIN", "CULT", "LABORGA"   Lab Results  Component Value Date   ALBUMIN 3.7 08/11/2011   ALBUMIN 4.0 01/19/2006    No results found for: "MG" No results found for: "VD25OH"  No results found for: "PREALBUMIN"    Latest Ref Rng & Units 08/16/2011    4:43  AM 08/15/2011   11:44 AM 08/11/2011    9:45 AM  CBC EXTENDED  WBC 4.0 - 10.5 K/uL 14.4  17.8  12.0   RBC 3.87 - 5.11 MIL/uL 3.16  3.59  4.21   Hemoglobin 12.0 - 15.0 g/dL 9.9  13.2  44.0   HCT 10.2 - 46.0 % 29.0  32.7  39.8   Platelets 150 - 400 K/uL 101  116  118   NEUT# 1.7 - 7.7 K/uL   9.6   Lymph# 0.7 - 4.0 K/uL   1.6      There is no height or weight on file to calculate BMI.  Orders:  Orders Placed This Encounter  Procedures   VAS US  ABI WITH/WO TBI   No orders of the defined types were placed in this encounter.    Procedures: No procedures performed  Clinical Data: No additional findings.  ROS:  All other systems negative, except as noted in the HPI. Review of Systems  Objective: Vital Signs: There were no vitals taken for this visit.  Specialty Comments:  No specialty comments available.  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 AVVA    Cancer [facility])    breast ca   Degenerative joint disease    Diverticulosis    GERD (gastroesophageal reflux disease)    Hyperlipidemia    Hypertension    Hypothyroidism    PT TAKES LEVOTHYROXINE    Osteoarthritis    Osteoporosis    Thyroid  disease    hypothyroidism   Ulcer    peptic    History reviewed. No pertinent family history.  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 OR B/P'S RIGHT ARM   COLON RESECTION  08/15/2011   Procedure: HAND ASSISTED LAPAROSCOPIC COLON RESECTION;  Surgeon: Harlee Lichtenstein, MD;  [facility]: [facility];  Service: General;  Laterality: N/A;   CYSTOSCOPY W/ RETROGRADES  08/15/2011   Procedure: CYSTOSCOPY WITH RETROGRADE PYELOGRAM;  Surgeon: Mark C Ottelin, MD;  Location: [facility];  Service: Urology;  Laterality: Left;  ureteral catheter insertion   gastric ulcer repair  1976   JOINT REPLACEMENT  2008   RIGHT TOTAL KNEE ARTHROPLASTY   MASTECTOMY Right 1998   SALPINGOOPHORECTOMY  08/15/2011   Procedure: SALPINGO OOPHERECTOMY;  Surgeon: Harlee Lichtenstein, MD;  [facility];  Service: General;  Laterality: Left;   Social History   Occupational History   Not on file  Tobacco Use   Smoking status: Never   Smokeless tobacco: Never  Substance and Sexual Activity   Alcohol  use: No   Drug use: No   Sexual activity: Not on file

## 2023-12-02 ENCOUNTER — Ambulatory Visit (HOSPITAL_COMMUNITY)
Admission: RE | Admit: 2023-12-02 | Discharge: 2023-12-02 | Disposition: A | Discharge status: Home / Self Care | Source: Ambulatory Visit | Attending: Orthopedic Surgery | Admitting: Orthopedic Surgery

## 2023-12-02 DIAGNOSIS — I83028 Varicose veins of left lower extremity with ulcer other part of lower leg: Secondary | ICD-10-CM

## 2023-12-02 DIAGNOSIS — I739 Peripheral vascular disease, unspecified: Secondary | ICD-10-CM

## 2023-12-02 LAB — VAS US ABI WITH/WO TBI

## 2023-12-04 NOTE — Addendum Note (Signed)
 Addended by: Gearldean Keepers on: 12/04/2023 04:18 PM   Modules accepted: Level of Service

## 2023-12-07 ENCOUNTER — Ambulatory Visit (INDEPENDENT_AMBULATORY_CARE_PROVIDER_SITE_OTHER): Admitting: Orthopedic Surgery

## 2023-12-07 ENCOUNTER — Encounter: Payer: Self-pay | Admitting: Orthopedic Surgery

## 2023-12-07 DIAGNOSIS — I83028 Varicose veins of left lower extremity with ulcer other part of lower leg: Secondary | ICD-10-CM

## 2023-12-07 NOTE — Progress Notes (Signed)
 Office Visit Note   Patient: Erin [name]           Date of Birth: 02/17/1941           MRN: [id number] Visit Date: 12/07/2023              Requested by: Lonzie Robins, MD [address] [place],  [place] [address] PCP: Avva, Ravisankar, MD  Chief Complaint  Patient presents with   Left Leg - Wound Check      HPI: Patient is an 83 year old woman who presents in follow-up for posterior medial venous ulcer left leg. Patient has been treated at the [facility] wound center for about 7 months.  She is here today for f/u in the venous stasis study for randomization.   Assessment & Plan: Visit Diagnoses:  1. Venous stasis ulcer of left lower leg (HCC)     Plan: Ulcer debrided, patient randomized to receive Kerecis tissue graft  Follow-Up Instructions: Return in about 1 week (around 12/14/2023).   Ortho Exam  Patient is alert, oriented, no adenopathy, well-dressed, normal affect, normal respiratory effort. Ulcer debrided with 10 blade knife, 50% granulation tissue, 50% fibrinases exudative. Narcotics: @CHLMME @  Imaging: No results found.   Labs: No results found for: "HGBA1C", "ESRSEDRATE", "CRP", "LABURIC", "REPTSTATUS", "GRAMSTAIN", "CULT", "LABORGA"   Lab Results  Component Value Date   ALBUMIN 3.7 08/11/2011   ALBUMIN 4.0 01/19/2006    No results found for: "MG" No results found for: "VD25OH"  No results found for: "PREALBUMIN"    Latest Ref Rng & Units 08/16/2011    4:43 AM 08/15/2011   11:44 AM 08/11/2011    9:45 AM  CBC EXTENDED  WBC 4.0 - 10.5 K/uL 14.4  17.8  12.0   RBC 3.87 - 5.11 MIL/uL 3.16  3.59  4.21   Hemoglobin 12.0 - 15.0 g/dL 9.9  62.1  30.8   HCT 65.7 - 46.0 % 29.0  32.7  39.8   Platelets 150 - 400 K/uL 101  116  118   NEUT# 1.7 - 7.7 K/uL   9.6   Lymph# 0.7 - 4.0 K/uL   1.6      There is no height or weight on file to calculate BMI.  Orders:  No orders of the defined types were placed in this encounter.  No orders of the  defined types were placed in this encounter.    Procedures: No procedures performed  Clinical Data: No additional findings.  ROS:  All other systems negative, except as noted in the HPI. Review of Systems  Objective: Vital Signs: There were no vitals taken for this visit.  Specialty Comments:  No specialty comments available.  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 AVVA    Cancer (HCC)    breast ca   Degenerative joint disease    Diverticulosis    GERD (gastroesophageal reflux disease)    Hyperlipidemia    Hypertension    Hypothyroidism    PT TAKES LEVOTHYROXINE    Osteoarthritis    Osteoporosis    Thyroid  disease    hypothyroidism   Ulcer    peptic    History reviewed. No pertinent  family history.  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 OR B/P'S RIGHT ARM   COLON RESECTION  08/15/2011   Procedure: HAND ASSISTED LAPAROSCOPIC COLON RESECTION;  Surgeon: Harlee Lichtenstein, MD;  [facility];  Service: General;  Laterality: N/A;   CYSTOSCOPY W/ RETROGRADES  08/15/2011   Procedure: CYSTOSCOPY WITH RETROGRADE PYELOGRAM;  Surgeon: Mark C Ottelin, MD;  [facility];  Service: Urology;  Laterality: Left;  ureteral catheter insertion   gastric ulcer repair  1976   JOINT REPLACEMENT  2008   RIGHT TOTAL KNEE ARTHROPLASTY   MASTECTOMY Right 1998   SALPINGOOPHORECTOMY  08/15/2011   Procedure: SALPINGO OOPHERECTOMY;  Surgeon: Harlee Lichtenstein, MD;   [facility];  Service: General;  Laterality: Left;   Social History   Occupational History   Not on file  Tobacco Use   Smoking status: Never   Smokeless tobacco: Never  Substance and Sexual Activity   Alcohol  use: No   Drug use: No   Sexual activity: Not on file

## 2023-12-14 ENCOUNTER — Ambulatory Visit

## 2023-12-14 DIAGNOSIS — I83028 Varicose veins of left lower extremity with ulcer other part of lower leg: Secondary | ICD-10-CM

## 2023-12-14 NOTE — Progress Notes (Signed)
 Office Visit Note   Patient: Erin [name]           Date of Birth: 04/11/1941           MRN: [id number] Visit Date: 12/14/2023              Requested by: Lonzie Robins, MD [address] [place],  [place] [address] PCP: Avva, Ravisankar, MD  Chief Complaint  Patient presents with   Left Leg - Follow-up      HPI: Patient is an 83 year old woman who presents in follow-up for posterior medial venous ulcer left leg. Patient has been treated at the [facility] wound center for about 7 months.  She is here today for f/u in the venous stasis study for randomization.   Assessment & Plan: Visit Diagnoses:  1. Venous stasis ulcer of left lower leg (HCC)     Plan: Ulcer debrided, patient randomized to receive Kerecis tissue graft   Follow-Up Instructions: Return in about 1 week (around 12/21/2023).   Ortho Exam  Patient is alert, oriented, no adenopathy, well-dressed, normal affect, normal respiratory effort. Ulcer debrided with 10 blade knife, 60% granulation tissue, 40% fibrinases exudative.   Picture before debridement     Imaging: No results found. No images are attached to the encounter.  Labs: No results found for: "HGBA1C", "ESRSEDRATE", "CRP", "LABURIC", "REPTSTATUS", "GRAMSTAIN", "CULT", "LABORGA"   Lab Results  Component Value Date   ALBUMIN 3.7 08/11/2011   ALBUMIN 4.0 01/19/2006    No results found for: "MG" No results found for: "VD25OH"  No results found for: "PREALBUMIN"    Latest Ref Rng & Units 08/16/2011    4:43 AM 08/15/2011   11:44 AM 08/11/2011    9:45 AM  CBC EXTENDED  WBC 4.0 - 10.5 K/uL 14.4  17.8  12.0   RBC 3.87 - 5.11 MIL/uL 3.16  3.59  4.21   Hemoglobin 12.0 - 15.0 g/dL 9.9  19.1  47.8   HCT 29.5 - 46.0 % 29.0  32.7  39.8   Platelets 150 - 400 K/uL 101  116  118   NEUT# 1.7 - 7.7 K/uL   9.6   Lymph# 0.7 - 4.0 K/uL   1.6      There is no height or weight on file to calculate BMI.  Orders:  No orders of the defined types  were placed in this encounter.  No orders of the defined types were placed in this encounter.    Procedures: No procedures performed  Clinical Data: No additional findings.  ROS:  All other systems negative, except as noted in the HPI. Review of Systems  Objective: Vital Signs: There were no vitals taken for this visit.  Specialty Comments:  No specialty comments available.  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 AVVA    Cancer [facility] [place] [facility])    breast ca   Degenerative joint disease    Diverticulosis    GERD (gastroesophageal reflux disease)    Hyperlipidemia    Hypertension    Hypothyroidism    PT TAKES LEVOTHYROXINE    Osteoarthritis    Osteoporosis    Thyroid  disease    hypothyroidism  Ulcer    peptic    History reviewed. No pertinent family history.  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 OR B/P'S RIGHT ARM   COLON RESECTION  08/15/2011   Procedure: HAND ASSISTED LAPAROSCOPIC COLON RESECTION;  Surgeon: Harlee Lichtenstein, MD;  [facility];  Service: General;  Laterality: N/A;   CYSTOSCOPY W/ RETROGRADES  08/15/2011   Procedure: CYSTOSCOPY WITH RETROGRADE PYELOGRAM;  Surgeon: Mark C Ottelin, MD;  [facility];  Service: Urology;  Laterality: Left;  ureteral catheter insertion   gastric ulcer repair  1976   JOINT REPLACEMENT  2008   RIGHT TOTAL KNEE ARTHROPLASTY   MASTECTOMY Right 1998   SALPINGOOPHORECTOMY  08/15/2011   Procedure: SALPINGO  OOPHERECTOMY;  Surgeon: Harlee Lichtenstein, MD;  [facility];  Service: General;  Laterality: Left;   Social History   Occupational History   Not on file  Tobacco Use   Smoking status: Never   Smokeless tobacco: Never  Substance and Sexual Activity   Alcohol  use: No   Drug use: No   Sexual activity: Not on file

## 2023-12-14 NOTE — Addendum Note (Signed)
 Addended by: Gearldean Keepers on: 12/14/2023 11:18 AM   Modules accepted: Level of Service

## 2023-12-21 ENCOUNTER — Ambulatory Visit (HOSPITAL_BASED_OUTPATIENT_CLINIC_OR_DEPARTMENT_OTHER): Admitting: General Surgery

## 2023-12-21 ENCOUNTER — Encounter: Payer: Self-pay | Admitting: Orthopedic Surgery

## 2023-12-21 ENCOUNTER — Ambulatory Visit (INDEPENDENT_AMBULATORY_CARE_PROVIDER_SITE_OTHER): Admitting: Orthopedic Surgery

## 2023-12-21 DIAGNOSIS — I83028 Varicose veins of left lower extremity with ulcer other part of lower leg: Secondary | ICD-10-CM

## 2023-12-21 DIAGNOSIS — I739 Peripheral vascular disease, unspecified: Secondary | ICD-10-CM

## 2023-12-21 NOTE — Progress Notes (Signed)
 Office Visit Note   Patient: Erin [name]           Date of Birth: 1940-11-19           MRN: [id number] Visit Date: 12/21/2023              Requested by: Lonzie Robins, MD [address] [place],  [place] [address] PCP: Avva, Ravisankar, MD  Chief Complaint  Patient presents with   Left Leg - Follow-up    THOR TV3      HPI: Patient is an 83 year old woman who was seen in follow-up for the venous ulcer study.  Patient states that she has had drainage coming through the bottom of the wrap.  Assessment & Plan: Visit Diagnoses:  1. Venous stasis ulcer of left lower leg (HCC)   2. PAD (peripheral artery disease) (HCC)     Plan: Recommended elevation with keeping her foot above her heart.  Kerecis tissue graft reapplied with a compression wrap.  Follow-Up Instructions: No follow-ups on file.   Ortho Exam  Patient is alert, oriented, no adenopathy, well-dressed, normal affect, normal respiratory effort. Examination patient has maceration distal to the venous ulcer secondary to drainage from her leg being dependent.  She has a new blister proximally.  There is no cellulitis.  After informed consent the wound was debrided with a 10 blade knife.  After debridement there is approximately 50% granulation tissue.  The wound measures 4.4 x 2.6 cm.  Kerecis tissue graft was applied with the multilayer compression wrap.    Imaging: No results found.   Labs: No results found for: HGBA1C, ESRSEDRATE, CRP, LABURIC, REPTSTATUS, GRAMSTAIN, CULT, LABORGA   Lab Results  Component Value Date   ALBUMIN 3.7 08/11/2011   ALBUMIN 4.0 01/19/2006    No results found for: MG No results found for: VD25OH  No results found for: PREALBUMIN    Latest Ref Rng & Units 08/16/2011    4:43 AM 08/15/2011   11:44 AM 08/11/2011    9:45 AM  CBC EXTENDED  WBC 4.0 - 10.5 K/uL 14.4  17.8  12.0   RBC 3.87 - 5.11 MIL/uL 3.16  3.59  4.21   Hemoglobin 12.0 - 15.0 g/dL 9.9   40.1  02.7   HCT 36.0 - 46.0 % 29.0  32.7  39.8   Platelets 150 - 400 K/uL 101  116  118   NEUT# 1.7 - 7.7 K/uL   9.6   Lymph# 0.7 - 4.0 K/uL   1.6      There is no height or weight on file to calculate BMI.  Orders:  No orders of the defined types were placed in this encounter.  No orders of the defined types were placed in this encounter.    Procedures: No procedures performed  Clinical Data: No additional findings.  ROS:  All other systems negative, except as noted in the HPI. Review of Systems  Objective: Vital Signs: There were no vitals taken for this visit.  Specialty Comments:  No specialty comments available.  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 AVVA    Cancer [facility]    breast ca   Degenerative  joint disease    Diverticulosis    GERD (gastroesophageal reflux disease)    Hyperlipidemia    Hypertension    Hypothyroidism    PT TAKES LEVOTHYROXINE    Osteoarthritis    Osteoporosis    Thyroid  disease    hypothyroidism   Ulcer    peptic    History reviewed. No pertinent family history.  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 OR B/P'S RIGHT ARM   COLON RESECTION  08/15/2011   Procedure: HAND ASSISTED LAPAROSCOPIC COLON RESECTION;  Surgeon: Harlee Lichtenstein, MD;  [facility];  Service: General;  Laterality: N/A;   CYSTOSCOPY W/ RETROGRADES  08/15/2011   Procedure: CYSTOSCOPY WITH RETROGRADE PYELOGRAM;  Surgeon: Mark C Ottelin, MD;   [facility];  Service: Urology;  Laterality: Left;  ureteral catheter insertion   gastric ulcer repair  1976   JOINT REPLACEMENT  2008   RIGHT TOTAL KNEE ARTHROPLASTY   MASTECTOMY Right 1998   SALPINGOOPHORECTOMY  08/15/2011   Procedure: SALPINGO OOPHERECTOMY;  Surgeon: Harlee Lichtenstein, MD;  [facility];  Service: General;  Laterality: Left;   Social History   Occupational History   Not on file  Tobacco Use   Smoking status: Never   Smokeless tobacco: Never  Substance and Sexual Activity   Alcohol  use: No   Drug use: No   Sexual activity: Not on file

## 2023-12-30 ENCOUNTER — Encounter: Payer: Self-pay | Admitting: Family

## 2023-12-30 ENCOUNTER — Ambulatory Visit: Admitting: Family

## 2023-12-30 DIAGNOSIS — I83028 Varicose veins of left lower extremity with ulcer other part of lower leg: Secondary | ICD-10-CM

## 2023-12-30 DIAGNOSIS — I739 Peripheral vascular disease, unspecified: Secondary | ICD-10-CM

## 2023-12-30 NOTE — Progress Notes (Signed)
 Office Visit Note   Patient: Erin [name]           Date of Birth: 06-Sep-1940           MRN: [id number] Visit Date: 12/30/2023              Requested by: Janey Santos, MD [address] [place],  [place] [address] PCP: Avva, Ravisankar, MD  No chief complaint on file.     HPI: The patient is an 83 year old woman who is seen in follow-up for venous ulceration.  She had Kerecis tissue graft applied with a compression wrap at her last visit June 16.   Assessment & Plan: Visit Diagnoses: No diagnosis found.  Plan: Ulcer debrided today patient tolerated well Kerecis tissue graft reapplied today with a compression wrap.  Follow-Up Instructions: No follow-ups on file.   Ortho Exam  Patient is alert, oriented, no adenopathy, well-dressed, normal affect, normal respiratory effort. On examination of the left lower extremity the maceration around the wound has improved medial leg ulcer today measuring 4 cm x 2 cm with 1 mm of depth Kerecis tissue graft reapplied with a multilayer compression wrap there is granulation through the graft material.    Imaging: No results found. No images are attached to the encounter.  Labs: No results found for: HGBA1C, ESRSEDRATE, CRP, LABURIC, REPTSTATUS, GRAMSTAIN, CULT, LABORGA   Lab Results  Component Value Date   ALBUMIN 3.7 08/11/2011   ALBUMIN 4.0 01/19/2006    No results found for: MG No results found for: VD25OH  No results found for: PREALBUMIN    Latest Ref Rng & Units 08/16/2011    4:43 AM 08/15/2011   11:44 AM 08/11/2011    9:45 AM  CBC EXTENDED  WBC 4.0 - 10.5 K/uL 14.4  17.8  12.0   RBC 3.87 - 5.11 MIL/uL 3.16  3.59  4.21   Hemoglobin 12.0 - 15.0 g/dL 9.9  88.8  87.0   HCT 63.9 - 46.0 % 29.0  32.7  39.8   Platelets 150 - 400 K/uL 101  116  118   NEUT# 1.7 - 7.7 K/uL   9.6   Lymph# 0.7 - 4.0 K/uL   1.6      There is no height or weight on file to calculate BMI.  Orders:  No orders of  the defined types were placed in this encounter.  No orders of the defined types were placed in this encounter.    Procedures: No procedures performed  Clinical Data: No additional findings.  ROS:  All other systems negative, except as noted in the HPI. Review of Systems  Objective: Vital Signs: There were no vitals taken for this visit.  Specialty Comments:  No specialty comments available.  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 AVVA    Cancer [facility]    breast ca   Degenerative joint disease    Diverticulosis    GERD (gastroesophageal reflux disease)    Hyperlipidemia    Hypertension    Hypothyroidism    PT TAKES LEVOTHYROXINE    Osteoarthritis    Osteoporosis    Thyroid  disease    hypothyroidism   Ulcer  peptic    History reviewed. No pertinent family history.  Past Surgical History:  Procedure Laterality Date   ABDOMINAL HYSTERECTOMY  1975   APPENDECTOMY  08/15/2011   Procedure: APPENDECTOMY;  Surgeon: Krystal JINNY Russell, MD;  [facility];  Service: General;  Laterality: N/A;   BREAST SURGERY  1998   mastectomy right breast - PT STATES NO NEEDLES OR B/P'S RIGHT ARM   COLON RESECTION  08/15/2011   Procedure: HAND ASSISTED LAPAROSCOPIC COLON RESECTION;  Surgeon: Krystal JINNY Russell, MD;  [facility];  Service: General;  Laterality: N/A;   CYSTOSCOPY W/ RETROGRADES  08/15/2011   Procedure: CYSTOSCOPY WITH RETROGRADE PYELOGRAM;  Surgeon: Mark C Ottelin, MD;  [facility];  Service: Urology;  Laterality: Left;  ureteral catheter insertion   gastric ulcer repair  1976   JOINT REPLACEMENT  2008   RIGHT TOTAL KNEE ARTHROPLASTY   MASTECTOMY Right 1998   SALPINGOOPHORECTOMY  08/15/2011    Procedure: SALPINGO OOPHERECTOMY;  Surgeon: Krystal JINNY Russell, MD;  [facility];  Service: General;  Laterality: Left;   Social History   Occupational History   Not on file  Tobacco Use   Smoking status: Never   Smokeless tobacco: Never  Substance and Sexual Activity   Alcohol  use: No   Drug use: No   Sexual activity: Not on file

## 2024-01-04 ENCOUNTER — Ambulatory Visit (INDEPENDENT_AMBULATORY_CARE_PROVIDER_SITE_OTHER): Admitting: Orthopedic Surgery

## 2024-01-04 ENCOUNTER — Encounter: Payer: Self-pay | Admitting: Orthopedic Surgery

## 2024-01-04 DIAGNOSIS — I83028 Varicose veins of left lower extremity with ulcer other part of lower leg: Secondary | ICD-10-CM

## 2024-01-04 DIAGNOSIS — I739 Peripheral vascular disease, unspecified: Secondary | ICD-10-CM

## 2024-01-04 NOTE — Progress Notes (Signed)
 Office Visit Note   Patient: Erin [name]           Date of Birth: [birth year]           MRN: [id number] Visit Date: 01/04/2024              Requested by: Janey Santos, MD [address] [place],  [place] [address] PCP: Avva, Ravisankar, MD  Chief Complaint  Patient presents with   Left Leg - Follow-up      HPI: Patient is an 83 year old woman who is seen in follow-up for the [facility] study.  Patient states that the study ulcer is the only ulcerative area that does not hurt.  Assessment & Plan: Visit Diagnoses:  1. PAD (peripheral artery disease) (HCC)   2. Venous stasis ulcer of left lower leg (HCC)     Plan: Will reapply the [facility] tissue graft and multilayer compression.  Follow-Up Instructions: Return in about 1 week (around 01/11/2024).   Ortho Exam  Patient is alert, oriented, no adenopathy, well-dressed, normal affect, normal respiratory effort. Examination the study ulcer has flat healthy granulation tissue with essentially 75% granulation tissue.  The ulcer and largest dimensions is 4 x 2.1 cm.    Imaging: No results found.    Labs: No results found for: HGBA1C, ESRSEDRATE, CRP, LABURIC, REPTSTATUS, GRAMSTAIN, CULT, LABORGA   Lab Results  Component Value Date   ALBUMIN 3.7 08/11/2011   ALBUMIN 4.0 01/19/2006    No results found for: MG No results found for: VD25OH  No results found for: PREALBUMIN    Latest Ref Rng & Units 08/16/2011    4:43 AM 08/15/2011   11:44 AM 08/11/2011    9:45 AM  CBC EXTENDED  WBC 4.0 - 10.5 K/uL 14.4  17.8  12.0   RBC 3.87 - 5.11 MIL/uL 3.16  3.59  4.21   Hemoglobin 12.0 - 15.0 g/dL 9.9  88.8  87.0   HCT 63.9 - 46.0 % 29.0  32.7  39.8   Platelets 150 - 400 K/uL 101  116  118   NEUT# 1.7 - 7.7 K/uL   9.6   Lymph# 0.7 - 4.0 K/uL   1.6      There is no height or weight on file to calculate BMI.  Orders:  No orders of the defined types were placed in this encounter.  No orders of the  defined types were placed in this encounter.    Procedures: No procedures performed  Clinical Data: No additional findings.  ROS:  All other systems negative, except as noted in the HPI. Review of Systems  Objective: Vital Signs: There were no vitals taken for this visit.  Specialty Comments:  No specialty comments available.  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 AVVA    Cancer [facility])    breast ca   Degenerative joint disease    Diverticulosis    GERD (gastroesophageal reflux disease)    Hyperlipidemia    Hypertension    Hypothyroidism    PT TAKES LEVOTHYROXINE    Osteoarthritis    Osteoporosis    Thyroid  disease    hypothyroidism   Ulcer    peptic  History reviewed. No pertinent family history.  Past Surgical History:  Procedure Laterality Date   ABDOMINAL HYSTERECTOMY  1975   APPENDECTOMY  08/15/2011   Procedure: APPENDECTOMY;  Surgeon: Krystal JINNY Russell, MD;  Location: [facility];  Service: General;  Laterality: N/A;   BREAST SURGERY  1998   mastectomy right breast - PT STATES NO NEEDLES OR B/P'S RIGHT ARM   COLON RESECTION  08/15/2011   Procedure: HAND ASSISTED LAPAROSCOPIC COLON RESECTION;  Surgeon: Krystal JINNY Russell, MD;  [facility]: [facility];  Service: General;  Laterality: N/A;   CYSTOSCOPY W/ RETROGRADES  08/15/2011   Procedure: CYSTOSCOPY WITH RETROGRADE PYELOGRAM;  Surgeon: Mark C Ottelin, MD;  Location: [facility];  Service: Urology;  Laterality: Left;  ureteral catheter insertion   gastric ulcer repair  1976   JOINT REPLACEMENT  2008   RIGHT TOTAL KNEE ARTHROPLASTY   MASTECTOMY Right 1998   SALPINGOOPHORECTOMY  08/15/2011   Procedure: SALPINGO OOPHERECTOMY;  Surgeon: Krystal JINNY Russell, MD;   [facility];  Service: General;  Laterality: Left;   Social History   Occupational History   Not on file  Tobacco Use   Smoking status: Never   Smokeless tobacco: Never  Substance and Sexual Activity   Alcohol  use: No   Drug use: No   Sexual activity: Not on file

## 2024-01-11 ENCOUNTER — Ambulatory Visit: Admitting: Orthopedic Surgery

## 2024-01-11 ENCOUNTER — Encounter: Payer: Self-pay | Admitting: Orthopedic Surgery

## 2024-01-11 DIAGNOSIS — I739 Peripheral vascular disease, unspecified: Secondary | ICD-10-CM

## 2024-01-11 DIAGNOSIS — I83028 Varicose veins of left lower extremity with ulcer other part of lower leg: Secondary | ICD-10-CM

## 2024-01-11 MED ORDER — DOXYCYCLINE HYCLATE 100 MG PO TABS: 100.0000 mg | ORAL_TABLET | Freq: Two times a day (BID) | ORAL | 0 refills | Status: AC

## 2024-01-11 MED ORDER — TRAMADOL HCL 50 MG PO TABS: 50.0000 mg | ORAL_TABLET | Freq: Four times a day (QID) | ORAL | 0 refills | Status: AC | PRN

## 2024-01-11 MED ORDER — CIPROFLOXACIN HCL 500 MG PO TABS: 500.0000 mg | ORAL_TABLET | Freq: Two times a day (BID) | ORAL | 0 refills | Status: AC

## 2024-01-11 NOTE — Progress Notes (Signed)
 Office Visit Note   Patient: Erin [name]           Date of Birth: 01-17-1941           MRN: [id number] Visit Date: 01/11/2024              Requested by: Janey Santos, MD [address] [place],  [place] [address] PCP: Avva, Ravisankar, MD  Chief Complaint  Patient presents with   Left Leg - Wound Check    TV6      HPI: Patient is a 83 year old woman who presents in follow-up for venous ulceration medial left ankle.  Patient has had increased drainage and has an odor.  There is maceration of the skin distally.  Assessment & Plan: Visit Diagnoses: No diagnosis found.  Plan: Ulcer was debrided of skin and soft tissue with a 10 blade knife.  This tissue was sent for cultures.  Patient was empirically started on doxycycline  and Cipro .  Patient states that she had back pain from her previous course of Levaquin.  Kerecis applied with compression wrap.  Follow-Up Instructions: No follow-ups on file.   Ortho Exam  Patient is alert, oriented, no adenopathy, well-dressed, normal affect, normal respiratory effort. Examination patient has increased maceration distally from drainage.  There is a necrotic layer over the wound.  The wound was anesthetized with lidocaine  gel.  A 10 blade knife was used to excise the nonviable tissue.  The wound bed was flat with healthy granulation tissue after debridement.  The wound measures 2.5 x 4 cm after debridement.  The wound was wrapped with compression for hemostasis.  This was removed the Kerecis graft was applied plus compression wrap.  Follow-up in 1 week.    Imaging: No results found.    Labs: No results found for: HGBA1C, ESRSEDRATE, CRP, LABURIC, REPTSTATUS, GRAMSTAIN, CULT, LABORGA   Lab Results  Component Value Date   ALBUMIN 3.7 08/11/2011   ALBUMIN 4.0 01/19/2006    No results found for: MG No results found for: VD25OH  No results found for: PREALBUMIN    Latest Ref Rng & Units 08/16/2011     4:43 AM 08/15/2011   11:44 AM 08/11/2011    9:45 AM  CBC EXTENDED  WBC 4.0 - 10.5 K/uL 14.4  17.8  12.0   RBC 3.87 - 5.11 MIL/uL 3.16  3.59  4.21   Hemoglobin 12.0 - 15.0 g/dL 9.9  88.8  87.0   HCT 63.9 - 46.0 % 29.0  32.7  39.8   Platelets 150 - 400 K/uL 101  116  118   NEUT# 1.7 - 7.7 K/uL   9.6   Lymph# 0.7 - 4.0 K/uL   1.6      There is no height or weight on file to calculate BMI.  Orders:  No orders of the defined types were placed in this encounter.  No orders of the defined types were placed in this encounter.    Procedures: No procedures performed  Clinical Data: No additional findings.  ROS:  All other systems negative, except as noted in the HPI. Review of Systems  Objective: Vital Signs: There were no vitals taken for this visit.  Specialty Comments:  No specialty comments available.  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 AVVA    Cancer [facility])    breast ca   Degenerative joint disease    Diverticulosis    GERD (gastroesophageal reflux disease)    Hyperlipidemia    Hypertension    Hypothyroidism    PT TAKES LEVOTHYROXINE    Osteoarthritis    Osteoporosis    Thyroid  disease    hypothyroidism   Ulcer    peptic    No family history on file.  Past Surgical History:  Procedure Laterality Date   ABDOMINAL HYSTERECTOMY  1975   APPENDECTOMY  08/15/2011   Procedure: APPENDECTOMY;  Surgeon: Krystal JINNY Russell, MD;  [facility];  Service: General;  Laterality: N/A;   BREAST SURGERY  1998   mastectomy right breast - PT STATES NO NEEDLES OR B/P'S RIGHT ARM   COLON RESECTION  08/15/2011   Procedure: HAND ASSISTED LAPAROSCOPIC COLON RESECTION;  Surgeon: Krystal JINNY Russell, MD;  [facility];  Service: General;  Laterality: N/A;   CYSTOSCOPY W/ RETROGRADES  08/15/2011   Procedure: CYSTOSCOPY WITH RETROGRADE PYELOGRAM;  Surgeon: Mark C Ottelin, MD;  [facility];  Service: Urology;  Laterality: Left;  ureteral catheter insertion   gastric ulcer repair  1976   JOINT REPLACEMENT  2008   RIGHT TOTAL KNEE ARTHROPLASTY   MASTECTOMY Right 1998   SALPINGOOPHORECTOMY  08/15/2011   Procedure: SALPINGO OOPHERECTOMY;  Surgeon: Krystal JINNY Russell, MD;  [facility];  Service: General;  Laterality: Left;   Social History   Occupational History   Not on file  Tobacco Use   Smoking status: Never   Smokeless tobacco: Never  Substance and Sexual Activity   Alcohol  use: No   Drug use: No   Sexual activity: Not on file

## 2024-01-17 LAB — ANAEROBIC AND AEROBIC CULTURE
MICRO NUMBER:: 16665455
MICRO NUMBER:: 16665456
SPECIMEN QUALITY:: ADEQUATE
SPECIMEN QUALITY:: ADEQUATE

## 2024-01-18 ENCOUNTER — Encounter: Payer: Self-pay | Admitting: Physician Assistant

## 2024-01-18 ENCOUNTER — Ambulatory Visit: Admitting: Physician Assistant

## 2024-01-18 DIAGNOSIS — I83028 Varicose veins of left lower extremity with ulcer other part of lower leg: Secondary | ICD-10-CM

## 2024-01-18 NOTE — Progress Notes (Signed)
 Office Visit Note   Patient: Erin [name]           Date of Birth: 09-Nov-1940           MRN: [id number] Visit Date: 01/18/2024              Requested by: Janey Santos, MD [address] [place],  [place] [address] PCP: Avva, Ravisankar, MD  Chief Complaint  Patient presents with   Left Leg - Wound Check    THOR TV7      HPI: Patient is an 83 year old woman who was seen in follow-up for the venous ulcer study THOR.  Patient has been treated at the [facility] wound center for about 7 months.  On her last visit she had increased drainage and some skin maceration.  Ulcer was debrided of skin and soft tissue with a 10 blade knife. This tissue was sent for cultures.  Patient was empirically started on doxycycline  and Cipro . Kerecis applied with compression wrap.   She is here today for follow up visit.  Cultures Heavy growth of Group B Streptococcus isolated Beta-hemolytic streptococci are predictably susceptible to Penicillin and other beta-lactams.   Assessment & Plan: Visit Diagnoses:  1. Venous stasis ulcer of left lower leg (HCC)     Plan:  Kerecis and compression wrap placed over wound right LE.  She will finish the doxycycline  and Cipro  in 1 week.    Follow-Up Instructions: Return in about 1 week (around 01/25/2024).   Ortho Exam  Patient is alert, oriented, no adenopathy, well-dressed, normal affect, normal respiratory effort. 3.8 cm x 2.2 after sharp debridement of yellow fibrinous tissue with 10 blade 100 % granulation.      Imaging:        Labs: No results found for: HGBA1C, ESRSEDRATE, CRP, LABURIC, REPTSTATUS, GRAMSTAIN, CULT, LABORGA   Lab Results  Component Value Date   ALBUMIN 3.7 08/11/2011   ALBUMIN 4.0 01/19/2006    No results found for: MG No results found for: VD25OH  No results found for: PREALBUMIN    Latest Ref Rng & Units 08/16/2011    4:43 AM 08/15/2011   11:44 AM 08/11/2011    9:45 AM  CBC EXTENDED   WBC 4.0 - 10.5 K/uL 14.4  17.8  12.0   RBC 3.87 - 5.11 MIL/uL 3.16  3.59  4.21   Hemoglobin 12.0 - 15.0 g/dL 9.9  88.8  87.0   HCT 63.9 - 46.0 % 29.0  32.7  39.8   Platelets 150 - 400 K/uL 101  116  118   NEUT# 1.7 - 7.7 K/uL   9.6   Lymph# 0.7 - 4.0 K/uL   1.6      There is no height or weight on file to calculate BMI.  Orders:  No orders of the defined types were placed in this encounter.  No orders of the defined types were placed in this encounter.    Procedures: No procedures performed  Clinical Data: No additional findings.  ROS:  All other systems negative, except as noted in the HPI. Review of Systems  Objective: Vital Signs: There were no vitals taken for this visit.  Specialty Comments:  No specialty comments available.  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 AVVA    Cancer [facility])    breast ca   Degenerative joint disease    Diverticulosis    GERD (gastroesophageal reflux disease)    Hyperlipidemia    Hypertension    Hypothyroidism    PT TAKES LEVOTHYROXINE    Osteoarthritis    Osteoporosis    Thyroid  disease    hypothyroidism   Ulcer    peptic    History reviewed. No pertinent family history.  Past Surgical History:  Procedure Laterality Date   ABDOMINAL HYSTERECTOMY  1975   APPENDECTOMY  08/15/2011   Procedure: APPENDECTOMY;  Surgeon: Krystal JINNY Russell, MD;  [facility];  Service: General;  Laterality: N/A;   BREAST SURGERY  1998   mastectomy right breast - PT STATES NO NEEDLES OR B/P'S RIGHT ARM   COLON RESECTION  08/15/2011   Procedure: HAND ASSISTED LAPAROSCOPIC COLON RESECTION;  Surgeon: Krystal JINNY Russell, MD;  [facility];  Service: General;  Laterality: N/A;    CYSTOSCOPY W/ RETROGRADES  08/15/2011   Procedure: CYSTOSCOPY WITH RETROGRADE PYELOGRAM;  Surgeon: Mark C Ottelin, MD;  [facility];  Service: Urology;  Laterality: Left;  ureteral catheter insertion   gastric ulcer repair  1976   JOINT REPLACEMENT  2008   RIGHT TOTAL KNEE ARTHROPLASTY   MASTECTOMY Right 1998   SALPINGOOPHORECTOMY  08/15/2011   Procedure: SALPINGO OOPHERECTOMY;  Surgeon: Krystal JINNY Russell, MD;  [facility];  Service: General;  Laterality: Left;   Social History   Occupational History   Not on file  Tobacco Use   Smoking status: Never   Smokeless tobacco: Never  Substance and Sexual Activity   Alcohol  use: No   Drug use: No   Sexual activity: Not on file

## 2024-01-25 ENCOUNTER — Ambulatory Visit (INDEPENDENT_AMBULATORY_CARE_PROVIDER_SITE_OTHER): Admitting: Orthopedic Surgery

## 2024-01-25 VITALS — BP 165/68 | HR 87 | Temp 98.7°F | Resp 16

## 2024-01-25 DIAGNOSIS — I83028 Varicose veins of left lower extremity with ulcer other part of lower leg: Secondary | ICD-10-CM

## 2024-01-26 ENCOUNTER — Encounter: Payer: Self-pay | Admitting: Orthopedic Surgery

## 2024-01-26 NOTE — Progress Notes (Signed)
 Office Visit Note   Patient: Erin [name]           Date of Birth: 10/29/1940           MRN: [id number] Visit Date: 01/25/2024              Requested by: Janey Santos, MD [address] [place],  [place] [address] PCP: Avva, Ravisankar, MD  Chief Complaint  Patient presents with   Left Leg - Wound Check    TV8      HPI: Patient is an 83 year old woman who was seen in follow-up for venous stasis ulcer study left lower extremity she is receiving for skin graft and standard of care.  Assessment & Plan: Visit Diagnoses:  1. Venous stasis ulcer of left lower leg (HCC)     Plan: Kerecis tissue graft applied compression wrap applied  Follow-Up Instructions: Return in about 1 week (around 02/01/2024).   Ortho Exam  Patient is alert, oriented, no adenopathy, well-dressed, normal affect, normal respiratory effort. Examination blood pressure 165/68 pulse 87.  Examination the venous ulcer is sharply debrided with a 10 blade knife.  After debridement the ulcer is 3.6 x 2.4 cm with healthy flat granulation tissue.  Ulcer location posterior medial left ankle    Imaging: No results found.    Labs: No results found for: HGBA1C, ESRSEDRATE, CRP, LABURIC, REPTSTATUS, GRAMSTAIN, CULT, LABORGA   Lab Results  Component Value Date   ALBUMIN 3.7 08/11/2011   ALBUMIN 4.0 01/19/2006    No results found for: MG No results found for: VD25OH  No results found for: PREALBUMIN    Latest Ref Rng & Units 08/16/2011    4:43 AM 08/15/2011   11:44 AM 08/11/2011    9:45 AM  CBC EXTENDED  WBC 4.0 - 10.5 K/uL 14.4  17.8  12.0   RBC 3.87 - 5.11 MIL/uL 3.16  3.59  4.21   Hemoglobin 12.0 - 15.0 g/dL 9.9  88.8  87.0   HCT 63.9 - 46.0 % 29.0  32.7  39.8   Platelets 150 - 400 K/uL 101  116  118   NEUT# 1.7 - 7.7 K/uL   9.6   Lymph# 0.7 - 4.0 K/uL   1.6      There is no height or weight on file to calculate BMI.  Orders:  No orders of the defined types were  placed in this encounter.  No orders of the defined types were placed in this encounter.    Procedures: No procedures performed  Clinical Data: No additional findings.  ROS:  All other systems negative, except as noted in the HPI. Review of Systems  Objective: Vital Signs: BP (!) 165/68   Pulse 87   Temp 98.7 F (37.1 C)   Resp 16   Specialty Comments:  No specialty comments available.  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 AVVA    Cancer [facility])    breast ca   Degenerative joint disease    Diverticulosis    GERD (gastroesophageal reflux disease)    Hyperlipidemia    Hypertension    Hypothyroidism    PT TAKES LEVOTHYROXINE    Osteoarthritis  Osteoporosis    Thyroid  disease    hypothyroidism   Ulcer    peptic    History reviewed. No pertinent family history.  Past Surgical History:  Procedure Laterality Date   ABDOMINAL HYSTERECTOMY  1975   APPENDECTOMY  08/15/2011   Procedure: APPENDECTOMY;  Surgeon: Krystal JINNY Russell, MD;  [facility];  Service: General;  Laterality: N/A;   BREAST SURGERY  1998   mastectomy right breast - PT STATES NO NEEDLES OR B/P'S RIGHT ARM   COLON RESECTION  08/15/2011   Procedure: HAND ASSISTED LAPAROSCOPIC COLON RESECTION;  Surgeon: Krystal JINNY Russell, MD;  [facility];  Service: General;  Laterality: N/A;   CYSTOSCOPY W/ RETROGRADES  08/15/2011   Procedure: CYSTOSCOPY WITH RETROGRADE PYELOGRAM;  Surgeon: Mark C Ottelin, MD;  [facility];  Service: Urology;  Laterality: Left;  ureteral catheter insertion   gastric ulcer repair  1976   JOINT REPLACEMENT  2008   RIGHT TOTAL KNEE ARTHROPLASTY   MASTECTOMY Right 1998   SALPINGOOPHORECTOMY  08/15/2011    Procedure: SALPINGO OOPHERECTOMY;  Surgeon: Krystal JINNY Russell, MD;  [facility];  Service: General;  Laterality: Left;   Social History   Occupational History   Not on file  Tobacco Use   Smoking status: Never   Smokeless tobacco: Never  Substance and Sexual Activity   Alcohol  use: No   Drug use: No   Sexual activity: Not on file

## 2024-01-28 ENCOUNTER — Telehealth: Payer: Self-pay | Admitting: Orthopedic Surgery

## 2024-01-28 NOTE — Telephone Encounter (Signed)
 [facility] [place], have pt rescheduled at 4 on 02/02/24

## 2024-01-28 NOTE — Telephone Encounter (Signed)
 Pt's daughter called asking if they can switch  to Tuesday 7/29. No open appt for [facility] (study). Please call Cindy at [phone].

## 2024-02-01 ENCOUNTER — Ambulatory Visit: Admitting: Orthopedic Surgery

## 2024-02-02 ENCOUNTER — Ambulatory Visit: Admitting: Orthopedic Surgery

## 2024-02-02 ENCOUNTER — Encounter: Payer: Self-pay | Admitting: Orthopedic Surgery

## 2024-02-02 DIAGNOSIS — I83028 Varicose veins of left lower extremity with ulcer other part of lower leg: Secondary | ICD-10-CM

## 2024-02-02 NOTE — Progress Notes (Signed)
 Office Visit Note   Patient: Erin [name]           Date of Birth: 1940/12/25           MRN: [id number] Visit Date: 02/02/2024              Requested by: Janey Santos, MD [address] [place],  [place] [address] PCP: Avva, Ravisankar, MD  Chief Complaint  Patient presents with   Left Leg - Follow-up      HPI: Patient is an 83 year old woman who presents in follow-up for venous leg ulcer study graft plus compression.  Assessment & Plan: Visit Diagnoses:  1. Venous stasis ulcer of left lower leg (HCC)     Plan: Kerecis tissue graft was reapplied with a compression wrap.  Foam padding was placed over the Achilles.  Follow-Up Instructions: Return in about 1 week (around 02/09/2024).   Ortho Exam  Patient is alert, oriented, no adenopathy, well-dressed, normal affect, normal respiratory effort. Examination patient has some dermatitis over the Achilles from drainage.  No full-thickness skin breakdown.  The wound was debrided back to healthy viable granulation tissue.  The wound measures 2 x 3.5 cm after debridement.    Imaging: No results found. No images are attached to the encounter.  Labs: No results found for: HGBA1C, ESRSEDRATE, CRP, LABURIC, REPTSTATUS, GRAMSTAIN, CULT, LABORGA   Lab Results  Component Value Date   ALBUMIN 3.7 08/11/2011   ALBUMIN 4.0 01/19/2006    No results found for: MG No results found for: VD25OH  No results found for: PREALBUMIN    Latest Ref Rng & Units 08/16/2011    4:43 AM 08/15/2011   11:44 AM 08/11/2011    9:45 AM  CBC EXTENDED  WBC 4.0 - 10.5 K/uL 14.4  17.8  12.0   RBC 3.87 - 5.11 MIL/uL 3.16  3.59  4.21   Hemoglobin 12.0 - 15.0 g/dL 9.9  88.8  87.0   HCT 63.9 - 46.0 % 29.0  32.7  39.8   Platelets 150 - 400 K/uL 101  116  118   NEUT# 1.7 - 7.7 K/uL   9.6   Lymph# 0.7 - 4.0 K/uL   1.6      There is no height or weight on file to calculate BMI.  Orders:  No orders of the defined types  were placed in this encounter.  No orders of the defined types were placed in this encounter.    Procedures: No procedures performed  Clinical Data: No additional findings.  ROS:  All other systems negative, except as noted in the HPI. Review of Systems  Objective: Vital Signs: There were no vitals taken for this visit.  Specialty Comments:  No specialty comments available.  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 AVVA    Cancer [facility])    breast ca   Degenerative joint disease    Diverticulosis    GERD (gastroesophageal reflux disease)    Hyperlipidemia    Hypertension    Hypothyroidism    PT TAKES LEVOTHYROXINE    Osteoarthritis    Osteoporosis    Thyroid  disease    hypothyroidism   Ulcer  peptic    History reviewed. No pertinent family history.  Past Surgical History:  Procedure Laterality Date   ABDOMINAL HYSTERECTOMY  1975   APPENDECTOMY  08/15/2011   Procedure: APPENDECTOMY;  Surgeon: Krystal JINNY Russell, MD;  [facility];  Service: General;  Laterality: N/A;   BREAST SURGERY  1998   mastectomy right breast - PT STATES NO NEEDLES OR B/P'S RIGHT ARM   COLON RESECTION  08/15/2011   Procedure: HAND ASSISTED LAPAROSCOPIC COLON RESECTION;  Surgeon: Krystal JINNY Russell, MD;  [facility];  Service: General;  Laterality: N/A;   CYSTOSCOPY W/ RETROGRADES  08/15/2011   Procedure: CYSTOSCOPY WITH RETROGRADE PYELOGRAM;  Surgeon: Mark C Ottelin, MD;  [facility];  Service: Urology;  Laterality: Left;  ureteral catheter insertion   gastric ulcer repair  1976   JOINT REPLACEMENT  2008   RIGHT TOTAL KNEE ARTHROPLASTY   MASTECTOMY Right 1998   SALPINGOOPHORECTOMY  08/15/2011   Procedure: SALPINGO  OOPHERECTOMY;  Surgeon: Krystal JINNY Russell, MD;  [facility];  Service: General;  Laterality: Left;   Social History   Occupational History   Not on file  Tobacco Use   Smoking status: Never   Smokeless tobacco: Never  Substance and Sexual Activity   Alcohol  use: No   Drug use: No   Sexual activity: Not on file

## 2024-02-08 ENCOUNTER — Encounter: Payer: Self-pay | Admitting: Orthopedic Surgery

## 2024-02-08 ENCOUNTER — Ambulatory Visit: Admitting: Orthopedic Surgery

## 2024-02-08 DIAGNOSIS — I83028 Varicose veins of left lower extremity with ulcer other part of lower leg: Secondary | ICD-10-CM

## 2024-02-08 NOTE — Progress Notes (Signed)
 Office Visit Note   Patient: Erin [name]           Date of Birth: March 21, 1941           MRN: [id number] Visit Date: 02/08/2024              Requested by: Janey Santos, MD [address] [place],  [place] [address] PCP: Avva, Ravisankar, MD  Chief Complaint  Patient presents with   Right Leg - Follow-up    THOR TV10      HPI: Patient is an 83 year old woman who presents in follow-up for the venous leg ulcer study.  Patient has postoperative shoes that she is worn for several years that are nonfunctional.  Assessment & Plan: Visit Diagnoses:  1. Venous stasis ulcer of left lower leg (HCC)     Plan: Kerecis applied plus compression wrap.  Follow-Up Instructions: Return in about 1 week (around 02/15/2024).   Ortho Exam  Patient is alert, oriented, no adenopathy, well-dressed, normal affect, normal respiratory effort. Examination of the medial ankles unchanged.  He currently measures 2 x 3.5 cm.  There is healthy granulation tissue.  This was debrided with good petechial bleeding.    Imaging: No results found. No images are attached to the encounter.  Labs: No results found for: HGBA1C, ESRSEDRATE, CRP, LABURIC, REPTSTATUS, GRAMSTAIN, CULT, LABORGA   Lab Results  Component Value Date   ALBUMIN 3.7 08/11/2011   ALBUMIN 4.0 01/19/2006    No results found for: MG No results found for: VD25OH  No results found for: PREALBUMIN    Latest Ref Rng & Units 08/16/2011    4:43 AM 08/15/2011   11:44 AM 08/11/2011    9:45 AM  CBC EXTENDED  WBC 4.0 - 10.5 K/uL 14.4  17.8  12.0   RBC 3.87 - 5.11 MIL/uL 3.16  3.59  4.21   Hemoglobin 12.0 - 15.0 g/dL 9.9  88.8  87.0   HCT 63.9 - 46.0 % 29.0  32.7  39.8   Platelets 150 - 400 K/uL 101  116  118   NEUT# 1.7 - 7.7 K/uL   9.6   Lymph# 0.7 - 4.0 K/uL   1.6      There is no height or weight on file to calculate BMI.  Orders:  No orders of the defined types were placed in this encounter.  No  orders of the defined types were placed in this encounter.    Procedures: No procedures performed  Clinical Data: No additional findings.  ROS:  All other systems negative, except as noted in the HPI. Review of Systems  Objective: Vital Signs: There were no vitals taken for this visit.  Specialty Comments:  No specialty comments available.  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 AVVA    [facility])    breast ca   Degenerative joint disease    Diverticulosis    GERD (gastroesophageal reflux disease)    Hyperlipidemia    Hypertension    Hypothyroidism    PT TAKES LEVOTHYROXINE    Osteoarthritis    Osteoporosis    Thyroid  disease    hypothyroidism   Ulcer    peptic  History reviewed. No pertinent family history.  Past Surgical History:  Procedure Laterality Date   ABDOMINAL HYSTERECTOMY  1975   APPENDECTOMY  08/15/2011   Procedure: APPENDECTOMY;  Surgeon: Krystal JINNY Russell, MD;  Location: [facility];  Service: General;  Laterality: N/A;   BREAST SURGERY  1998   mastectomy right breast - PT STATES NO NEEDLES OR B/P'S RIGHT ARM   COLON RESECTION  08/15/2011   Procedure: HAND ASSISTED LAPAROSCOPIC COLON RESECTION;  Surgeon: Krystal JINNY Russell, MD;  [facility];  Service: General;  Laterality: N/A;   CYSTOSCOPY W/ RETROGRADES  08/15/2011   Procedure: CYSTOSCOPY WITH RETROGRADE PYELOGRAM;  Surgeon: Mark C Ottelin, MD;  [facility];  Service: Urology;  Laterality: Left;  ureteral catheter insertion   gastric ulcer repair  1976   JOINT REPLACEMENT  2008   RIGHT TOTAL KNEE ARTHROPLASTY   MASTECTOMY Right 1998   SALPINGOOPHORECTOMY  08/15/2011   Procedure: SALPINGO OOPHERECTOMY;  Surgeon: Krystal JINNY Russell, MD;  [facility];  Service: General;  Laterality: Left;   Social History   Occupational History   Not on file  Tobacco Use   Smoking status: Never   Smokeless tobacco: Never  Substance and Sexual Activity   Alcohol  use: No   Drug use: No   Sexual activity: Not on file

## 2024-02-11 ENCOUNTER — Encounter: Admitting: Orthopedic Surgery

## 2024-02-15 ENCOUNTER — Encounter: Payer: Self-pay | Admitting: Orthopedic Surgery

## 2024-02-15 ENCOUNTER — Ambulatory Visit: Admitting: Orthopedic Surgery

## 2024-02-15 DIAGNOSIS — I83028 Varicose veins of left lower extremity with ulcer other part of lower leg: Secondary | ICD-10-CM

## 2024-02-15 DIAGNOSIS — I739 Peripheral vascular disease, unspecified: Secondary | ICD-10-CM

## 2024-02-15 NOTE — Progress Notes (Signed)
 Office Visit Note   Patient: Erin [name]           Date of Birth: 09/13/1940           MRN: [id number] Visit Date: 02/15/2024              Requested by: Janey Santos, MD [address] [place],  [place] [address] PCP: Avva, Ravisankar, MD  Chief Complaint  Patient presents with   Right Leg - Follow-up      HPI: Patient is a 83 year old woman who presents in follow-up for the venous leg ulcer study.  Patient feels like her leg is feeling better and looking better.  Assessment & Plan: Visit Diagnoses:  1. Venous stasis ulcer of left lower leg (HCC)   2. PAD (peripheral artery disease) (HCC)     Plan: Will apply Kerecis and compression wrap reevaluate next week  Follow-Up Instructions: Return in about 1 week (around 02/22/2024).   Ortho Exam  Patient is alert, oriented, no adenopathy, well-dressed, normal affect, normal respiratory effort. Examination the periwound area continues to improve the wound bed has flat healthy granulation tissue.  Currently measures 2 x 3.5 cm.  Wound bed was cleansed and debrided to healthy viable granulation tissue.    Imaging: No results found.   Labs: No results found for: HGBA1C, ESRSEDRATE, CRP, LABURIC, REPTSTATUS, GRAMSTAIN, CULT, LABORGA   Lab Results  Component Value Date   ALBUMIN 3.7 08/11/2011   ALBUMIN 4.0 01/19/2006    No results found for: MG No results found for: VD25OH  No results found for: PREALBUMIN    Latest Ref Rng & Units 08/16/2011    4:43 AM 08/15/2011   11:44 AM 08/11/2011    9:45 AM  CBC EXTENDED  WBC 4.0 - 10.5 K/uL 14.4  17.8  12.0   RBC 3.87 - 5.11 MIL/uL 3.16  3.59  4.21   Hemoglobin 12.0 - 15.0 g/dL 9.9  88.8  87.0   HCT 63.9 - 46.0 % 29.0  32.7  39.8   Platelets 150 - 400 K/uL 101  116  118   NEUT# 1.7 - 7.7 K/uL   9.6   Lymph# 0.7 - 4.0 K/uL   1.6      There is no height or weight on file to calculate BMI.  Orders:  No orders of the defined types were  placed in this encounter.  No orders of the defined types were placed in this encounter.    Procedures: No procedures performed  Clinical Data: No additional findings.  ROS:  All other systems negative, except as noted in the HPI. Review of Systems  Objective: Vital Signs: There were no vitals taken for this visit.  Specialty Comments:  No specialty comments available.  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 AVVA    [facility])    breast ca   Degenerative joint disease    Diverticulosis    GERD (gastroesophageal reflux disease)    Hyperlipidemia    Hypertension    Hypothyroidism    PT TAKES LEVOTHYROXINE    Osteoarthritis    Osteoporosis    Thyroid  disease    hypothyroidism   Ulcer  peptic    History reviewed. No pertinent family history.  Past Surgical History:  Procedure Laterality Date   ABDOMINAL HYSTERECTOMY  1975   APPENDECTOMY  08/15/2011   Procedure: APPENDECTOMY;  Surgeon: Krystal JINNY Russell, MD;  [facility];  Service: General;  Laterality: N/A;   BREAST SURGERY  1998   mastectomy right breast - PT STATES NO NEEDLES OR B/P'S RIGHT ARM   COLON RESECTION  08/15/2011   Procedure: HAND ASSISTED LAPAROSCOPIC COLON RESECTION;  Surgeon: Krystal JINNY Russell, MD;  [facility];  Service: General;  Laterality: N/A;   CYSTOSCOPY W/ RETROGRADES  08/15/2011   Procedure: CYSTOSCOPY WITH RETROGRADE PYELOGRAM;  Surgeon: Mark C Ottelin, MD;  [facility];  Service: Urology;  Laterality: Left;  ureteral catheter insertion   gastric ulcer repair  1976   JOINT REPLACEMENT  2008   RIGHT TOTAL KNEE ARTHROPLASTY   MASTECTOMY Right 1998   SALPINGOOPHORECTOMY  08/15/2011   Procedure: SALPINGO  OOPHERECTOMY;  Surgeon: Krystal JINNY Russell, MD;  [facility];  Service: General;  Laterality: Left;   Social History   Occupational History   Not on file  Tobacco Use   Smoking status: Never   Smokeless tobacco: Never  Substance and Sexual Activity   Alcohol  use: No   Drug use: No   Sexual activity: Not on file

## 2024-02-23 ENCOUNTER — Encounter: Payer: Self-pay | Admitting: Physician Assistant

## 2024-02-23 ENCOUNTER — Ambulatory Visit: Admitting: Physician Assistant

## 2024-02-23 DIAGNOSIS — I83028 Varicose veins of left lower extremity with ulcer other part of lower leg: Secondary | ICD-10-CM

## 2024-02-23 NOTE — Progress Notes (Signed)
 Office Visit Note   Patient: Erin [name]           Date of Birth: [birth year]           MRN: [id number] Visit Date: 02/23/2024              Requested by: Janey Santos, MD [address] [place],  [place] [address] PCP: Avva, Ravisankar, MD  Chief Complaint  Patient presents with   Left Leg - Wound Check    THOR TV12      YEP:Ejupzwu is a 83 year old woman who presents in follow-up for the venous leg ulcer study.    Assessment & Plan: Visit Diagnoses:  1. Venous stasis ulcer of left lower leg (HCC)     Plan: Will apply Kerecis and compression wrap reevaluate next week   Follow-Up Instructions: Return in about 1 week (around 03/01/2024).   Ortho Exam  Patient is alert, oriented, no adenopathy, well-dressed, normal affect, normal respiratory effort. 100 % granulation.  3.7 cm x 2 cm in size.  Wound bed is flat.  Mile erythema inferior to the wound without frank skin breakdown.  Easily palpable DP pulse.  Wound bed was cleansed and debrided [facility] granulation tissue with 10 blade.      Imaging: No results found.   Labs: No results found for: HGBA1C, ESRSEDRATE, CRP, LABURIC, REPTSTATUS, GRAMSTAIN, CULT, LABORGA   Lab Results  Component Value Date   ALBUMIN 3.7 08/11/2011   ALBUMIN 4.0 01/19/2006    No results found for: MG No results found for: VD25OH  No results found for: PREALBUMIN    Latest Ref Rng & Units 08/16/2011    4:43 AM 08/15/2011   11:44 AM 08/11/2011    9:45 AM  CBC EXTENDED  WBC 4.0 - 10.5 K/uL 14.4  17.8  12.0   RBC 3.87 - 5.11 MIL/uL 3.16  3.59  4.21   Hemoglobin 12.0 - 15.0 g/dL 9.9  88.8  87.0   HCT 63.9 - 46.0 % 29.0  32.7  39.8   Platelets 150 - 400 K/uL 101  116  118   NEUT# 1.7 - 7.7 K/uL   9.6   Lymph# 0.7 - 4.0 K/uL   1.6      There is no height or weight on file to calculate BMI.  Orders:  No orders of the defined types were placed in this encounter.  No orders of the defined types were  placed in this encounter.    Procedures: No procedures performed  Clinical Data: No additional findings.  ROS:  All other systems negative, except as noted in the HPI. Review of Systems  Objective: Vital Signs: There were no vitals taken for this visit.  Specialty Comments:  No specialty comments available.  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 AVVA    Cancer [facility])    breast ca   Degenerative joint disease    Diverticulosis    GERD (gastroesophageal reflux disease)    Hyperlipidemia    Hypertension    Hypothyroidism    PT TAKES LEVOTHYROXINE    Osteoarthritis    Osteoporosis    Thyroid  disease    hypothyroidism  Ulcer    peptic    History reviewed. No pertinent family history.  Past Surgical History:  Procedure Laterality Date   ABDOMINAL HYSTERECTOMY  1975   APPENDECTOMY  08/15/2011   Procedure: APPENDECTOMY;  Surgeon: Krystal JINNY Russell, MD;  [facility];  Service: General;  Laterality: N/A;   BREAST SURGERY  1998   mastectomy right breast - PT STATES NO NEEDLES OR B/P'S RIGHT ARM   COLON RESECTION  08/15/2011   Procedure: HAND ASSISTED LAPAROSCOPIC COLON RESECTION;  Surgeon: Krystal JINNY Russell, MD;  [facility];  Service: General;  Laterality: N/A;   CYSTOSCOPY W/ RETROGRADES  08/15/2011   Procedure: CYSTOSCOPY WITH RETROGRADE PYELOGRAM;  Surgeon: Mark C Ottelin, MD;  [facility];  Service: Urology;  Laterality: Left;  ureteral catheter insertion   gastric ulcer repair  1976   JOINT REPLACEMENT  2008   RIGHT TOTAL KNEE ARTHROPLASTY   MASTECTOMY Right 1998   SALPINGOOPHORECTOMY  08/15/2011   Procedure: SALPINGO OOPHERECTOMY;  Surgeon: Krystal JINNY Russell, MD;  [facility];   Service: General;  Laterality: Left;   Social History   Occupational History   Not on file  Tobacco Use   Smoking status: Never   Smokeless tobacco: Never  Substance and Sexual Activity   Alcohol  use: No   Drug use: No   Sexual activity: Not on file

## 2024-02-26 ENCOUNTER — Other Ambulatory Visit: Payer: Self-pay | Admitting: Orthopedic Surgery

## 2024-02-29 ENCOUNTER — Ambulatory Visit (INDEPENDENT_AMBULATORY_CARE_PROVIDER_SITE_OTHER): Admitting: Physician Assistant

## 2024-02-29 ENCOUNTER — Encounter: Payer: Self-pay | Admitting: Physician Assistant

## 2024-02-29 DIAGNOSIS — I83028 Varicose veins of left lower extremity with ulcer other part of lower leg: Secondary | ICD-10-CM

## 2024-02-29 NOTE — Progress Notes (Signed)
 Office Visit Note   Patient: Erin [name]           Date of Birth: April 23, 1941           MRN: [id number] Visit Date: 02/29/2024              Requested by: Janey Santos, MD [address] [place],  [place] [address] PCP: Avva, Ravisankar, MD  Chief Complaint  Patient presents with   Left Leg - Follow-up      HPI: Patient is a 83 year old woman who presents in follow-up for the venous leg ulcer study.   Keresis was applied on her last visit 02/23/24.    Assessment & Plan: Visit Diagnoses: No diagnosis found.  Plan: Will apply Kerecis and compression wrap reevaluate next week   Follow-Up Instructions: No follow-ups on file.   Ortho Exam  Patient is alert, oriented, no adenopathy, well-dressed, normal affect, normal respiratory effort. Wound measures 3.5 x 2 cm flat with 100 % granulation tissue.  No edema or cellulitis.    Imaging:   Labs: No results found for: HGBA1C, ESRSEDRATE, CRP, LABURIC, REPTSTATUS, GRAMSTAIN, CULT, LABORGA   Lab Results  Component Value Date   ALBUMIN 3.7 08/11/2011   ALBUMIN 4.0 01/19/2006    No results found for: MG No results found for: VD25OH  No results found for: PREALBUMIN    Latest Ref Rng & Units 08/16/2011    4:43 AM 08/15/2011   11:44 AM 08/11/2011    9:45 AM  CBC EXTENDED  WBC 4.0 - 10.5 K/uL 14.4  17.8  12.0   RBC 3.87 - 5.11 MIL/uL 3.16  3.59  4.21   Hemoglobin 12.0 - 15.0 g/dL 9.9  88.8  87.0   HCT 63.9 - 46.0 % 29.0  32.7  39.8   Platelets 150 - 400 K/uL 101  116  118   NEUT# 1.7 - 7.7 K/uL   9.6   Lymph# 0.7 - 4.0 K/uL   1.6      There is no height or weight on file to calculate BMI.  Orders:  No orders of the defined types were placed in this encounter.  No orders of the defined types were placed in this encounter.    Procedures: No procedures performed  Clinical Data: No additional findings.  ROS:  All other systems negative, except as noted in the HPI. Review of  Systems  Objective: Vital Signs: There were no vitals taken for this visit.  Specialty Comments:  No specialty comments available.  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 AVVA    Cancer (HCC)    breast ca   Degenerative joint disease    Diverticulosis    GERD (gastroesophageal reflux disease)    Hyperlipidemia    Hypertension    Hypothyroidism    PT TAKES LEVOTHYROXINE    Osteoarthritis    Osteoporosis    Thyroid  disease    hypothyroidism   Ulcer    peptic    No family history on file.  Past Surgical History:  Procedure Laterality Date   ABDOMINAL HYSTERECTOMY  1975   APPENDECTOMY  08/15/2011   Procedure: APPENDECTOMY;  Surgeon: Krystal JINNY Russell,  MD;  [facility];  Service: General;  Laterality: N/A;   BREAST SURGERY  1998   mastectomy right breast - PT STATES NO NEEDLES OR B/P'S RIGHT ARM   COLON RESECTION  08/15/2011   Procedure: HAND ASSISTED LAPAROSCOPIC COLON RESECTION;  Surgeon: Krystal JINNY Russell, MD;  [facility];  Service: General;  Laterality: N/A;   CYSTOSCOPY W/ RETROGRADES  08/15/2011   Procedure: CYSTOSCOPY WITH RETROGRADE PYELOGRAM;  Surgeon: Mark C Ottelin, MD;  [facility];  Service: Urology;  Laterality: Left;  ureteral catheter insertion   gastric ulcer repair  1976   JOINT REPLACEMENT  2008   RIGHT TOTAL KNEE ARTHROPLASTY   MASTECTOMY Right 1998   SALPINGOOPHORECTOMY  08/15/2011   Procedure: SALPINGO OOPHERECTOMY;  Surgeon: Krystal JINNY Russell, MD;  [facility];  Service: General;  Laterality: Left;   Social History   Occupational History   Not on file  Tobacco Use   Smoking status: Never   Smokeless tobacco: Never  Substance and Sexual Activity   Alcohol  use: No    Drug use: No   Sexual activity: Not on file

## 2024-03-08 ENCOUNTER — Encounter: Payer: Self-pay | Admitting: Orthopedic Surgery

## 2024-03-08 ENCOUNTER — Ambulatory Visit: Admitting: Orthopedic Surgery

## 2024-03-08 DIAGNOSIS — I83028 Varicose veins of left lower extremity with ulcer other part of lower leg: Secondary | ICD-10-CM | POA: Diagnosis not present

## 2024-03-08 DIAGNOSIS — I739 Peripheral vascular disease, unspecified: Secondary | ICD-10-CM

## 2024-03-08 NOTE — Progress Notes (Signed)
 Office Visit Note   Patient: Erin [name]           Date of Birth: 1941-03-23           MRN: [id number] Visit Date: 03/08/2024              Requested by: Janey Santos, MD [address] [place],  [place] [address] PCP: Avva, Ravisankar, MD  Chief Complaint  Patient presents with   Left Leg - Wound Check      HPI: Discussed the use of AI scribe software for clinical note transcription with the patient, who gave verbal consent to proceed.  History of Present Illness   Patient is a 83 year old woman who is seen in follow-up for left leg venous ulcer.  She had donated Kerecis tissue graft and a Profore wrap applied at the last visit.  Assessment & Plan: Visit Diagnoses:  1. Venous stasis ulcer of left lower leg (HCC)   2. PAD (peripheral artery disease) (HCC)     Plan: Assessment and Plan Assessment & Plan   Assessment: Improved wound bed venous ulcer left leg.  Plan: Will start Vashe dressing changes daily continue elevation continue compression with an Ace wrap.    Follow-Up Instructions: Return in about 2 weeks (around 03/22/2024).   Ortho Exam  Patient is alert, oriented, no adenopathy, well-dressed, normal affect, normal respiratory effort. Physical Exam    Examination the wound bed is flat with the 100% healthy granulation tissue.  The wound measures 3.5 x 2 cm.  There is no cellulitis no drainage there is mild maceration around the wound edges.  The wound was debrided.  Vashe dressing and Ace wrap compression applied.   Imaging: No results found.   Labs: No results found for: HGBA1C, ESRSEDRATE, CRP, LABURIC, REPTSTATUS, GRAMSTAIN, CULT, LABORGA   Lab Results  Component Value Date   ALBUMIN 3.7 08/11/2011   ALBUMIN 4.0 01/19/2006    No results found for: MG No results found for: VD25OH  No results found for: PREALBUMIN    Latest Ref Rng & Units 08/16/2011    4:43 AM 08/15/2011   11:44 AM 08/11/2011    9:45 AM   CBC EXTENDED  WBC 4.0 - 10.5 K/uL 14.4  17.8  12.0   RBC 3.87 - 5.11 MIL/uL 3.16  3.59  4.21   Hemoglobin 12.0 - 15.0 g/dL 9.9  88.8  87.0   HCT 63.9 - 46.0 % 29.0  32.7  39.8   Platelets 150 - 400 K/uL 101  116  118   NEUT# 1.7 - 7.7 K/uL   9.6   Lymph# 0.7 - 4.0 K/uL   1.6      There is no height or weight on file to calculate BMI.  Orders:  No orders of the defined types were placed in this encounter.  No orders of the defined types were placed in this encounter.    Procedures: No procedures performed  Clinical Data: No additional findings.  ROS:  All other systems negative, except as noted in the HPI. Review of Systems  Objective: Vital Signs: There were no vitals taken for this visit.  Specialty Comments:  No specialty comments available.  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 AVVA    Cancer [facility])    breast ca   Degenerative joint disease    Diverticulosis    GERD (gastroesophageal reflux disease)    Hyperlipidemia    Hypertension    Hypothyroidism    PT TAKES LEVOTHYROXINE    Osteoarthritis    Osteoporosis    Thyroid  disease    hypothyroidism   Ulcer    peptic    History reviewed. No pertinent family history.  Past Surgical History:  Procedure Laterality Date   ABDOMINAL HYSTERECTOMY  1975   APPENDECTOMY  08/15/2011   Procedure: APPENDECTOMY;  Surgeon: Krystal JINNY Russell, MD;  [facility];  Service: General;  Laterality: N/A;   BREAST SURGERY  1998   mastectomy right breast - PT STATES NO NEEDLES OR B/P'S RIGHT ARM   COLON RESECTION  08/15/2011   Procedure: HAND ASSISTED LAPAROSCOPIC COLON RESECTION;  Surgeon: Krystal JINNY Russell, MD;  [facility];  Service: General;   Laterality: N/A;   CYSTOSCOPY W/ RETROGRADES  08/15/2011   Procedure: CYSTOSCOPY WITH RETROGRADE PYELOGRAM;  Surgeon: Mark C Ottelin, MD;  [facility];  Service: Urology;  Laterality: Left;  ureteral catheter insertion   gastric ulcer repair  1976   JOINT REPLACEMENT  2008   RIGHT TOTAL KNEE ARTHROPLASTY   MASTECTOMY Right 1998   SALPINGOOPHORECTOMY  08/15/2011   Procedure: SALPINGO OOPHERECTOMY;  Surgeon: Krystal JINNY Russell, MD;  [facility];  Service: General;  Laterality: Left;   Social History   Occupational History   Not on file  Tobacco Use   Smoking status: Never   Smokeless tobacco: Never  Substance and Sexual Activity   Alcohol  use: No   Drug use: No   Sexual activity: Not on file

## 2024-03-21 ENCOUNTER — Ambulatory Visit: Admitting: Orthopedic Surgery

## 2024-03-21 ENCOUNTER — Encounter: Payer: Self-pay | Admitting: Orthopedic Surgery

## 2024-03-21 DIAGNOSIS — I83028 Varicose veins of left lower extremity with ulcer other part of lower leg: Secondary | ICD-10-CM | POA: Diagnosis not present

## 2024-03-21 DIAGNOSIS — I739 Peripheral vascular disease, unspecified: Secondary | ICD-10-CM

## 2024-03-21 NOTE — Progress Notes (Signed)
 Office Visit Note   Patient: Erin [name]           Date of Birth: 1941/04/18           MRN: [id number] Visit Date: 03/21/2024              Requested by: Janey Santos, MD [address] [place],  [place] [address] PCP: Avva, Ravisankar, MD  Chief Complaint  Patient presents with   Right Leg - Wound Check      HPI: Discussed the use of AI scribe software for clinical note transcription with the patient, who gave verbal consent to proceed.  History of Present Illness Erin [name] is an 83 year old female who presents with leg swelling and pain.  She has been experiencing significant swelling and soreness in her leg, which has been persistent and was particularly painful until the past Thursday. Despite using compression wraps, she notes difficulty in applying them effectively. Elevating her leg has not significantly reduced the swelling, impacting her ability to perform activities at home.  The swelling is associated with pain and was previously redder. She changes the wraps daily and cleans the area, but the swelling has not reduced significantly. The wound bled last night when she changed the wrap.  She has been using a product provided for cleaning the area. She inquires about additional measures to reduce swelling, such as anti-inflammatory medications, but primarily relies on compression and elevation to manage her symptoms.     Assessment & Plan: Visit Diagnoses:  1. Venous stasis ulcer of left lower leg (HCC)   2. PAD (peripheral artery disease) (HCC)     Plan: Assessment and Plan Assessment & Plan Chronic lower extremity wound with pitting edema Chronic wound with healthy granulation and fibrinous tissue. No infection. Pitting edema causing delayed healing and pain due to insufficient compression. - Apply two layers of ACE wrap for compression. - Use Vashe solution daily. - Wash wound with soap and water  daily. - Scrub off fibrinous tissue with  a washcloth or sponge. - Schedule follow-up appointment in two weeks.      Follow-Up Instructions: Return in about 2 weeks (around 04/04/2024).   Ortho Exam  Patient is alert, oriented, no adenopathy, well-dressed, normal affect, normal respiratory effort. Physical Exam EXTREMITIES: Painful pitting edema present. SKIN: Wound stable, flat with 50% fibrinous tissue. No cellulitis, no drainage, no signs of infection.   Patient has a palpable dorsalis pedis pulse.  Patient has pitting edema that is tender to palpation of the left leg   Imaging: No results found. No images are attached to the encounter.  Labs: No results found for: HGBA1C, ESRSEDRATE, CRP, LABURIC, REPTSTATUS, GRAMSTAIN, CULT, LABORGA   Lab Results  Component Value Date   ALBUMIN 3.7 08/11/2011   ALBUMIN 4.0 01/19/2006    No results found for: MG No results found for: VD25OH  No results found for: PREALBUMIN    Latest Ref Rng & Units 08/16/2011    4:43 AM 08/15/2011   11:44 AM 08/11/2011    9:45 AM  CBC EXTENDED  WBC 4.0 - 10.5 K/uL 14.4  17.8  12.0   RBC 3.87 - 5.11 MIL/uL 3.16  3.59  4.21   Hemoglobin 12.0 - 15.0 g/dL 9.9  88.8  87.0   HCT 63.9 - 46.0 % 29.0  32.7  39.8   Platelets 150 - 400 K/uL 101  116  118   NEUT# 1.7 - 7.7 K/uL   9.6   Lymph# 0.7 -  4.0 K/uL   1.6      There is no height or weight on file to calculate BMI.  Orders:  No orders of the defined types were placed in this encounter.  No orders of the defined types were placed in this encounter.    Procedures: No procedures performed  Clinical Data: No additional findings.  ROS:  All other systems negative, except as noted in the HPI. Review of Systems  Objective: Vital Signs: There were no vitals taken for this visit.  Specialty Comments:  No specialty comments available.  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 AVVA    Cancer (HCC)    breast ca   Degenerative joint disease    Diverticulosis    GERD (gastroesophageal reflux disease)    Hyperlipidemia    Hypertension    Hypothyroidism    PT TAKES LEVOTHYROXINE    Osteoarthritis    Osteoporosis    Thyroid  disease    hypothyroidism   Ulcer    peptic    History reviewed. No pertinent family history.  Past Surgical History:  Procedure Laterality Date   ABDOMINAL HYSTERECTOMY  1975   APPENDECTOMY  08/15/2011   Procedure: APPENDECTOMY;  Surgeon: Krystal JINNY Russell, MD;  Location: [facility];  Service: General;  Laterality: N/A;   BREAST SURGERY  1998   mastectomy right breast - PT STATES NO NEEDLES OR B/P'S RIGHT ARM   COLON RESECTION  08/15/2011   Procedure: HAND ASSISTED LAPAROSCOPIC COLON RESECTION;  Surgeon: Krystal JINNY Russell, MD;  Location: [facility];  Service: General;  Laterality: N/A;   CYSTOSCOPY W/ RETROGRADES  08/15/2011   Procedure: CYSTOSCOPY WITH RETROGRADE PYELOGRAM;  Surgeon: Mark C Ottelin, MD;  Location: [facility];  Service: Urology;  Laterality: Left;  ureteral catheter insertion   gastric ulcer repair  1976   JOINT REPLACEMENT  2008   RIGHT TOTAL KNEE ARTHROPLASTY   MASTECTOMY Right 1998   SALPINGOOPHORECTOMY  08/15/2011   Procedure: SALPINGO OOPHERECTOMY;  Surgeon: Krystal JINNY Russell, MD;  Location: [facility];  Service: General;  Laterality: Left;   Social History   Occupational History   Not on file  Tobacco Use   Smoking status: Never   Smokeless tobacco: Never  Substance and Sexual Activity   Alcohol  use: No   Drug use: No   Sexual activity: Not on file

## 2024-04-04 ENCOUNTER — Encounter: Payer: Self-pay | Admitting: Orthopedic Surgery

## 2024-04-04 ENCOUNTER — Ambulatory Visit: Admitting: Orthopedic Surgery

## 2024-04-04 DIAGNOSIS — I83028 Varicose veins of left lower extremity with ulcer other part of lower leg: Secondary | ICD-10-CM | POA: Diagnosis not present

## 2024-04-04 DIAGNOSIS — I739 Peripheral vascular disease, unspecified: Secondary | ICD-10-CM

## 2024-04-04 MED ORDER — NITROGLYCERIN 0.2 MG/HR TD PT24: 0.2000 mg | MEDICATED_PATCH | Freq: Every day | TRANSDERMAL | 12 refills | Status: AC

## 2024-04-04 MED ORDER — PENTOXIFYLLINE ER 400 MG PO TBCR: 400.0000 mg | EXTENDED_RELEASE_TABLET | Freq: Three times a day (TID) | ORAL | 3 refills | Status: DC

## 2024-04-04 NOTE — Progress Notes (Signed)
 Office Visit Note   Patient: Erin [name]           Date of Birth: 1940/12/10           MRN: [id number] Visit Date: 04/04/2024              Requested by: Janey Santos, MD [address] [place],  [place] [address] PCP: Avva, Ravisankar, MD  Chief Complaint  Patient presents with   Right Leg - Wound Check      HPI: Discussed the use of AI scribe software for clinical note transcription with the patient, who gave verbal consent to proceed.  History of Present Illness Erin [name] is an 83 year old female who presents with a chronic non-healing wound on her ankle for follow-up.  She has been managing a chronic wound on her ankle for an extended period. The wound measures 4 by 2.5 centimeters and has shown minimal progress despite various treatments, including grafts and fish skin applications.  Previous diagnostic studies, including ankle brachial indices, reveal a great toe pressure on the left of 136 with biphasic flow at the ankle. The flow is non-compressible, and the arteries are calcified.  Her current medications include carteolol, amlodipine, and levothyroxine .     Assessment & Plan: Visit Diagnoses:  1. Venous stasis ulcer of left lower leg (HCC)   2. PAD (peripheral artery disease)     Plan: Assessment and Plan Assessment & Plan Chronic non-healing left ankle wound due to peripheral arterial disease with arterial calcification Chronic non-healing wound due to peripheral arterial disease with calcified arteries, impairing circulation and healing. - Initiated nitroglycerin patch therapy, apply daily around wound, not directly on it. - Prescribed Trental 400 mg TID. - Advised removal of patch if headache occurs, use half patch next time. - Instructed to continue gauze and wrap over patch. - Advised Tylenol  for headache relief if needed. - Sent prescriptions for nitroglycerin patch and Trental. - Scheduled follow-up in four weeks.       Follow-Up Instructions: Return in about 4 weeks (around 05/02/2024).   Ortho Exam  Patient is alert, oriented, no adenopathy, well-dressed, normal affect, normal respiratory effort. Physical Exam SKIN: Wound on ankle measuring 4x2.5 cm. The base of the wound has flat healthy granulation tissue with reduced yellow exudate.  There is no cellulitis no signs of infection.  Review of the ankle-brachial indices shows calcified vessels with biphasic flow at the ankle that is noncompressible and a great toe pressure greater than 100.  This      Imaging: No results found. No images are attached to the encounter.  Labs: No results found for: HGBA1C, ESRSEDRATE, CRP, LABURIC, REPTSTATUS, GRAMSTAIN, CULT, LABORGA   Lab Results  Component Value Date   ALBUMIN 3.7 08/11/2011   ALBUMIN 4.0 01/19/2006    No results found for: MG No results found for: VD25OH  No results found for: PREALBUMIN    Latest Ref Rng & Units 08/16/2011    4:43 AM 08/15/2011   11:44 AM 08/11/2011    9:45 AM  CBC EXTENDED  WBC 4.0 - 10.5 K/uL 14.4  17.8  12.0   RBC 3.87 - 5.11 MIL/uL 3.16  3.59  4.21   Hemoglobin 12.0 - 15.0 g/dL 9.9  88.8  87.0   HCT 63.9 - 46.0 % 29.0  32.7  39.8   Platelets 150 - 400 K/uL 101  116  118   NEUT# 1.7 - 7.7 K/uL   9.6   Lymph# 0.7 - 4.0  K/uL   1.6      There is no height or weight on file to calculate BMI.  Orders:  No orders of the defined types were placed in this encounter.  Meds ordered this encounter  Medications   pentoxifylline (TRENTAL) 400 MG CR tablet    Sig: Take 1 tablet (400 mg total) by mouth 3 (three) times daily with meals.    Dispense:  90 tablet    Refill:  3   nitroGLYCERIN (NITRODUR - DOSED IN MG/24 HR) 0.2 mg/hr patch    Sig: Place 1 patch (0.2 mg total) onto the skin daily.    Dispense:  30 patch    Refill:  12     Procedures: No procedures performed  Clinical Data: No additional findings.  ROS:  All other systems  negative, except as noted in the HPI. Review of Systems  Objective: Vital Signs: There were no vitals taken for this visit.  Specialty Comments:  No specialty comments available.  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 AVVA    Cancer (HCC)    breast ca   Degenerative joint disease    Diverticulosis    GERD (gastroesophageal reflux disease)    Hyperlipidemia    Hypertension    Hypothyroidism    PT TAKES LEVOTHYROXINE    Osteoarthritis    Osteoporosis    Thyroid  disease    hypothyroidism   Ulcer    peptic    History reviewed. No pertinent family history.  Past Surgical History:  Procedure Laterality Date   ABDOMINAL HYSTERECTOMY  1975   APPENDECTOMY  08/15/2011   Procedure: APPENDECTOMY;  Surgeon: Krystal JINNY Russell, MD;  Location: [facility];  Service: General;  Laterality: N/A;   BREAST SURGERY  1998   mastectomy right breast - PT STATES NO NEEDLES OR B/P'S RIGHT ARM   COLON RESECTION  08/15/2011   Procedure: HAND ASSISTED LAPAROSCOPIC COLON RESECTION;  Surgeon: Krystal JINNY Russell, MD;  [facility];  Service: General;  Laterality: N/A;   CYSTOSCOPY W/ RETROGRADES  08/15/2011   Procedure: CYSTOSCOPY WITH RETROGRADE PYELOGRAM;  Surgeon: Mark C Ottelin, MD;  Location[facility];  Service: Urology;  Laterality: Left;  ureteral catheter insertion   gastric ulcer repair  1976   JOINT REPLACEMENT  2008   RIGHT TOTAL KNEE ARTHROPLASTY   MASTECTOMY Right 1998   SALPINGOOPHORECTOMY  08/15/2011   Procedure: SALPINGO OOPHERECTOMY;  Surgeon: Krystal JINNY Russell, MD;  [facility];  Service: General;  Laterality: Left;   Social History   Occupational History   Not on file  Tobacco Use   Smoking status: Never   Smokeless  tobacco: Never  Substance and Sexual Activity   Alcohol  use: No   Drug use: No   Sexual activity: Not on file

## 2024-04-19 ENCOUNTER — Other Ambulatory Visit: Payer: Self-pay | Admitting: Orthopedic Surgery

## 2024-04-19 DIAGNOSIS — I87332 Chronic venous hypertension (idiopathic) with ulcer and inflammation of left lower extremity: Secondary | ICD-10-CM | POA: Diagnosis not present

## 2024-04-19 DIAGNOSIS — I4510 Unspecified right bundle-branch block: Secondary | ICD-10-CM | POA: Diagnosis not present

## 2024-04-19 DIAGNOSIS — R195 Other fecal abnormalities: Secondary | ICD-10-CM | POA: Diagnosis not present

## 2024-04-19 DIAGNOSIS — M810 Age-related osteoporosis without current pathological fracture: Secondary | ICD-10-CM | POA: Diagnosis not present

## 2024-04-19 DIAGNOSIS — R7301 Impaired fasting glucose: Secondary | ICD-10-CM | POA: Diagnosis not present

## 2024-04-19 DIAGNOSIS — E785 Hyperlipidemia, unspecified: Secondary | ICD-10-CM | POA: Diagnosis not present

## 2024-04-19 DIAGNOSIS — I129 Hypertensive chronic kidney disease with stage 1 through stage 4 chronic kidney disease, or unspecified chronic kidney disease: Secondary | ICD-10-CM | POA: Diagnosis not present

## 2024-04-19 DIAGNOSIS — N1831 Chronic kidney disease, stage 3a: Secondary | ICD-10-CM | POA: Diagnosis not present

## 2024-04-19 DIAGNOSIS — E039 Hypothyroidism, unspecified: Secondary | ICD-10-CM | POA: Diagnosis not present

## 2024-05-02 ENCOUNTER — Ambulatory Visit: Admitting: Orthopedic Surgery

## 2024-05-02 DIAGNOSIS — I83028 Varicose veins of left lower extremity with ulcer other part of lower leg: Secondary | ICD-10-CM | POA: Diagnosis not present

## 2024-05-02 DIAGNOSIS — B351 Tinea unguium: Secondary | ICD-10-CM | POA: Diagnosis not present

## 2024-05-02 DIAGNOSIS — I739 Peripheral vascular disease, unspecified: Secondary | ICD-10-CM

## 2024-05-03 ENCOUNTER — Encounter: Payer: Self-pay | Admitting: Orthopedic Surgery

## 2024-05-03 NOTE — Progress Notes (Signed)
 Office Visit Note   Patient: Erin [name]           Date of Birth: 1941-02-21           MRN: [id number] Visit Date: 05/02/2024              Requested by: Janey Santos, MD [address] [place],  [place] [address] PCP: Avva, Ravisankar, MD  Chief Complaint  Patient presents with   Left Leg - Follow-up      HPI: Discussed the use of AI scribe software for clinical note transcription with the patient, who gave verbal consent to proceed.  History of Present Illness Erin [name] is an 83 year old female who presents for wound care management.  She has a wound measuring 4 cm by 2.5 cm. The most effective dressings have been Kirsten and Edrick, and she has noticed some skin growth in the middle of the wound, described as 'little islands of skin.' She washes the wound daily with soap and water , uses 4x4 gauze, ACE wrap, and keeps the area elevated.  She is currently taking amiodarone and vitamin B12. She also takes vitamin D3 and a vitamin K supplement. She has not been using protein supplements recently but reports that she did use them in the past and may resume them.  She experienced local irritation from a patch applied to her arm, which caused pain extending from her arm to her leg. The pain was persistent and intolerable, leading her to discontinue its use. Despite this, she continues to take her prescribed medications.     Assessment & Plan: Visit Diagnoses:  1. Venous stasis ulcer of left lower leg (HCC)   2. PAD (peripheral artery disease)   3. Onychomycosis     Plan: Assessment and Plan Assessment & Plan Chronic non-healing skin ulcer with local skin irritation from topical patch Chronic ulcer 4 cm x 2.5 cm with healthy granulation and epithelialization. Local irritation from topical patch. Current wound care. Emphasized nutritional support for healing. - Continue daily wound care: soap and water , 4x4 gauze, ACE wrap, elevation, exercise. - Continue  triamterene. - Add protein supplement twice daily. - Add L-arginine supplement. - Follow up in four weeks.      Follow-Up Instructions: Return in about 4 weeks (around 05/30/2024).   Ortho Exam  Patient is alert, oriented, no adenopathy, well-dressed, normal affect, normal respiratory effort. Physical Exam SKIN: The wound is flat with healthy granulation tissue, good petechial bleeding, and islands of epithelialization in the middle. The wound measures 2.5 x 4 cm and is unchanged.  Patient has thickened discolored onychomycotic nails.  patient is unable to safely trim the nails on her own and the nails are trimmed x 9 without complication.      Imaging: No results found. No images are attached to the encounter.  Labs: No results found for: HGBA1C, ESRSEDRATE, CRP, LABURIC, REPTSTATUS, GRAMSTAIN, CULT, LABORGA   Lab Results  Component Value Date   ALBUMIN 3.7 08/11/2011   ALBUMIN 4.0 01/19/2006    No results found for: MG No results found for: VD25OH  No results found for: PREALBUMIN    Latest Ref Rng & Units 08/16/2011    4:43 AM 08/15/2011   11:44 AM 08/11/2011    9:45 AM  CBC EXTENDED  WBC 4.0 - 10.5 K/uL 14.4  17.8  12.0   RBC 3.87 - 5.11 MIL/uL 3.16  3.59  4.21   Hemoglobin 12.0 - 15.0 g/dL 9.9  88.8  12.9  HCT 36.0 - 46.0 % 29.0  32.7  39.8   Platelets 150 - 400 K/uL 101  116  118   NEUT# 1.7 - 7.7 K/uL   9.6   Lymph# 0.7 - 4.0 K/uL   1.6      There is no height or weight on file to calculate BMI.  Orders:  No orders of the defined types were placed in this encounter.  No orders of the defined types were placed in this encounter.    Procedures: No procedures performed  Clinical Data: No additional findings.  ROS:  All other systems negative, except as noted in the HPI. Review of Systems  Objective: Vital Signs: There were no vitals taken for this visit.  Specialty Comments:  No specialty comments available.  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 AVVA    Cancer (HCC)    breast ca   Degenerative joint disease    Diverticulosis    GERD (gastroesophageal reflux disease)    Hyperlipidemia    Hypertension    Hypothyroidism    PT TAKES LEVOTHYROXINE    Osteoarthritis    Osteoporosis    Thyroid  disease    hypothyroidism   Ulcer    peptic    History reviewed. No pertinent family history.  Past Surgical History:  Procedure Laterality Date   ABDOMINAL HYSTERECTOMY  1975   APPENDECTOMY  08/15/2011   Procedure: APPENDECTOMY;  Surgeon: Krystal JINNY Russell, MD;  Location: [facility];  Service: General;  Laterality: N/A;   BREAST SURGERY  1998   mastectomy right breast - PT STATES NO NEEDLES OR B/P'S RIGHT ARM   COLON RESECTION  08/15/2011   Procedure: HAND ASSISTED LAPAROSCOPIC COLON RESECTION;  Surgeon: Krystal JINNY Russell, MD;  Location: [facility];  Service: General;  Laterality: N/A;   CYSTOSCOPY W/ RETROGRADES  08/15/2011   Procedure: CYSTOSCOPY WITH RETROGRADE PYELOGRAM;  Surgeon: Mark C Ottelin, MD;  Location: [facility];  Service: Urology;  Laterality: Left;  ureteral catheter insertion   gastric ulcer repair  1976   JOINT REPLACEMENT  2008   RIGHT TOTAL KNEE ARTHROPLASTY   MASTECTOMY Right 1998   SALPINGOOPHORECTOMY  08/15/2011   Procedure: SALPINGO OOPHERECTOMY;  Surgeon: Krystal JINNY Russell, MD;  Location[facility];  Service: General;  Laterality: Left;   Social History   Occupational History   Not on file  Tobacco Use   Smoking status: Never   Smokeless tobacco: Never  Substance and Sexual Activity   Alcohol  use: No   Drug use: No   Sexual activity: Not on file

## 2024-05-06 ENCOUNTER — Telehealth: Payer: Self-pay | Admitting: Orthopedic Surgery

## 2024-05-06 NOTE — Telephone Encounter (Signed)
 PT WILL CALL BACK WHEN SHE CHECK WITH DAUGHTER TO R/S WITH DUDA,ERIN, OR MAUREEN. DUDE OUT OF OFFICE ON PT APPT

## 2024-05-23 ENCOUNTER — Encounter: Payer: Self-pay | Admitting: Physician Assistant

## 2024-05-23 ENCOUNTER — Ambulatory Visit: Admitting: Physician Assistant

## 2024-05-23 DIAGNOSIS — I83028 Varicose veins of left lower extremity with ulcer other part of lower leg: Secondary | ICD-10-CM | POA: Diagnosis not present

## 2024-05-23 NOTE — Progress Notes (Signed)
 Office Visit Note   Patient: Erin [name]           Date of Birth: [birth year]           MRN: [id number] Visit Date: 05/23/2024              Requested by: Janey Santos, MD [address] [place],  [place] [address] PCP: Janey Santos, MD  Chief Complaint  Patient presents with   Left Leg - Wound Check      HPI: 83 y/o female with chronic venous ulcer posterior lateral left leg.  She states its been present for > 2 years.  She was in the [place] study and received Kerecis.  She has also tried Vashe wet to dry dressings.  The wound has gotten a little smaller.    Sh takes vitamin D3 and a vitamin K supplement. She has not been using protein supplements recently but reports that she did use them in the past and may resume them   Assessment & Plan: Visit Diagnoses: No diagnosis found.  Plan: We will place Silver alginate over the wound bed.  She will continue to clean the wound bed daily with Vashe to decrease the bacterial load.  Elevation above the hert.  She was given an elevation hand out.    Follow-Up Instructions: Return in about 4 weeks (around 06/20/2024).   Ortho Exam  Patient is alert, oriented, no adenopathy, well-dressed, normal affect, normal respiratory effort. 100% granulation base measuring 3.7 cm x 2 cm.  The surface was cleaned with Vashe on a 4 x 4 she tolerated this well.  She states she gets drainage on the guaze with changes.  No surrounding cellulitis or active drainage.   Palpable DP pulse.     Imaging: No results found. No images are attached to the encounter.  Labs: No results found for: HGBA1C, ESRSEDRATE, CRP, LABURIC, REPTSTATUS, GRAMSTAIN, CULT, LABORGA   Lab Results  Component Value Date   ALBUMIN 3.7 08/11/2011   ALBUMIN 4.0 01/19/2006    No results found for: MG No results found for: VD25OH  No results found for: PREALBUMIN    Latest Ref Rng & Units 08/16/2011    4:43 AM 08/15/2011   11:44 AM 08/11/2011     9:45 AM  CBC EXTENDED  WBC 4.0 - 10.5 K/uL 14.4  17.8  12.0   RBC 3.87 - 5.11 MIL/uL 3.16  3.59  4.21   Hemoglobin 12.0 - 15.0 g/dL 9.9  88.8  87.0   HCT 63.9 - 46.0 % 29.0  32.7  39.8   Platelets 150 - 400 K/uL 101  116  118   NEUT# 1.7 - 7.7 K/uL   9.6   Lymph# 0.7 - 4.0 K/uL   1.6      There is no height or weight on file to calculate BMI.  Orders:  No orders of the defined types were placed in this encounter.  No orders of the defined types were placed in this encounter.    Procedures: No procedures performed  Clinical Data: No additional findings.  ROS:  All other systems negative, except as noted in the HPI. Review of Systems  Objective: Vital Signs: There were no vitals taken for this visit.  Specialty Comments:  No specialty comments available.  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 AVVA    Cancer [facility])    breast ca   Degenerative joint disease    Diverticulosis    GERD (gastroesophageal reflux disease)    Hyperlipidemia    Hypertension    Hypothyroidism    PT TAKES LEVOTHYROXINE    Osteoarthritis    Osteoporosis    Thyroid  disease    hypothyroidism   Ulcer    peptic    History reviewed. No pertinent family history.  Past Surgical History:  Procedure Laterality Date   ABDOMINAL HYSTERECTOMY  1975   APPENDECTOMY  08/15/2011   Procedure: APPENDECTOMY;  Surgeon: Krystal JINNY Russell, MD;  Location: [facility];  Service: General;  Laterality: N/A;   BREAST SURGERY  1998   mastectomy right breast - PT STATES NO NEEDLES OR B/P'S RIGHT ARM   COLON RESECTION  08/15/2011   Procedure: HAND ASSISTED LAPAROSCOPIC COLON RESECTION;  Surgeon: Krystal JINNY Russell, MD;  Location: [facility];  Service:  General;  Laterality: N/A;   CYSTOSCOPY W/ RETROGRADES  08/15/2011   Procedure: CYSTOSCOPY WITH RETROGRADE PYELOGRAM;  Surgeon: Mark C Ottelin, MD;  Location: [facility];  Service: Urology;  Laterality: Left;  ureteral catheter insertion   gastric ulcer repair  1976   JOINT REPLACEMENT  2008   RIGHT TOTAL KNEE ARTHROPLASTY   MASTECTOMY Right 1998   SALPINGOOPHORECTOMY  08/15/2011   Procedure: SALPINGO OOPHERECTOMY;  Surgeon: Krystal JINNY Russell, MD;  [facility];  Service: General;  Laterality: Left;   Social History   Occupational History   Not on file  Tobacco Use   Smoking status: Never   Smokeless tobacco: Never  Substance and Sexual Activity   Alcohol  use: No   Drug use: No   Sexual activity: Not on file

## 2024-05-30 ENCOUNTER — Ambulatory Visit: Admitting: Orthopedic Surgery

## 2024-06-20 ENCOUNTER — Encounter: Payer: Self-pay | Admitting: Physician Assistant

## 2024-06-20 ENCOUNTER — Ambulatory Visit: Admitting: Physician Assistant

## 2024-06-20 DIAGNOSIS — I83028 Varicose veins of left lower extremity with ulcer other part of lower leg: Secondary | ICD-10-CM

## 2024-06-20 NOTE — Progress Notes (Signed)
 Office Visit Note   Patient: Erin [name]           Date of Birth: [birth year]           MRN: [id number] Visit Date: 06/20/2024              Requested by: Janey Santos, MD [address] 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 She states its been present for > 2 years. She was in the [place] study and received Kerecis. She has also tried Vashe wet to dry dressings.  After her last visit she tried silver alginate.  The wound has staled.    Assessment & Plan: Visit Diagnoses: No diagnosis found.  Plan: The wound has staled and does not seem to be decreasing in size.  She did try silver alginate for the past 4 weeks.  She has tried [employer] as well.  I gave her a vive sleeve cut to place up against the wound bed.  She will cover it with 4 x 4 guaze for the drainage and ace wrap compression.  We also talked about taking Vit C, vit D, zinc and B12.  She states she is suppose to drink a protein shake 2 times a day, but does not always do it.  She will increase the protein and elevation.  I asked her to keep a log.    Follow-Up Instructions: Return in about 4 weeks (around 07/18/2024).   Ortho Exam  Patient is alert, oriented, no adenopathy, well-dressed, normal affect, normal respiratory effort. 100% granulation base measuring 4 cm x 2 cm.  The surface was cleaned with Vashe on a 4 x 4 she tolerated this well.  She states she gets drainage on the guaze with changes.  No surrounding cellulitis or active drainage.   Palpable DP pulse.      Imaging: No results found. No images are attached to the encounter.  Labs: No results found for: HGBA1C, ESRSEDRATE, CRP, LABURIC, REPTSTATUS, GRAMSTAIN, CULT, LABORGA   Lab Results  Component Value Date   ALBUMIN 3.7 08/11/2011   ALBUMIN 4.0 01/19/2006    No  results found for: MG No results found for: VD25OH  No results found for: PREALBUMIN    Latest Ref Rng & Units 08/16/2011    4:43 AM 08/15/2011   11:44 AM 08/11/2011    9:45 AM  CBC EXTENDED  WBC 4.0 - 10.5 K/uL 14.4  17.8  12.0   RBC 3.87 - 5.11 MIL/uL 3.16  3.59  4.21   Hemoglobin 12.0 - 15.0 g/dL 9.9  88.8  87.0   HCT 63.9 - 46.0 % 29.0  32.7  39.8   Platelets 150 - 400 K/uL 101  116  118   NEUT# 1.7 - 7.7 K/uL   9.6   Lymph# 0.7 - 4.0 K/uL   1.6      There is no height or weight on file to calculate BMI.  Orders:  No orders of the defined types were placed in this encounter.  No orders of the defined types were placed in this encounter.    Procedures: No procedures performed  Clinical Data: No additional findings.  ROS:  All other systems negative, except as noted in the HPI. Review of Systems  Objective: Vital Signs: There were no vitals taken for this  visit.  Specialty Comments:  No specialty comments available.  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 AVVA    Cancer (HCC)    breast ca   Degenerative joint disease    Diverticulosis    GERD (gastroesophageal reflux disease)    Hyperlipidemia    Hypertension    Hypothyroidism    PT TAKES LEVOTHYROXINE    Osteoarthritis    Osteoporosis    Thyroid  disease    hypothyroidism   Ulcer    peptic    History reviewed. No pertinent family history.  Past Surgical History:  Procedure Laterality Date   ABDOMINAL HYSTERECTOMY  1975   APPENDECTOMY  08/15/2011   Procedure: APPENDECTOMY;  Surgeon: Krystal JINNY Russell, MD;  Location: [facility];  Service: General;  Laterality: N/A;   BREAST SURGERY  1998   mastectomy right breast - PT STATES NO  NEEDLES OR B/P'S RIGHT ARM   COLON RESECTION  08/15/2011   Procedure: HAND ASSISTED LAPAROSCOPIC COLON RESECTION;  Surgeon: Krystal JINNY Russell, MD;  Location: [facility];  Service: General;  Laterality: N/A;   CYSTOSCOPY W/ RETROGRADES  08/15/2011   Procedure: CYSTOSCOPY WITH RETROGRADE PYELOGRAM;  Surgeon: Mark C Ottelin, MD;  Location: [facility];  Service: Urology;  Laterality: Left;  ureteral catheter insertion   gastric ulcer repair  1976   JOINT REPLACEMENT  2008   RIGHT TOTAL KNEE ARTHROPLASTY   MASTECTOMY Right 1998   SALPINGOOPHORECTOMY  08/15/2011   Procedure: SALPINGO OOPHERECTOMY;  Surgeon: Krystal JINNY Russell, MD;  Location: [facility];  Service: General;  Laterality: Left;   Social History   Occupational History   Not on file  Tobacco Use   Smoking status: Never   Smokeless tobacco: Never  Substance and Sexual Activity   Alcohol  use: No   Drug use: No   Sexual activity: Not on file

## 2024-06-29 ENCOUNTER — Other Ambulatory Visit: Payer: Self-pay | Admitting: Orthopedic Surgery

## 2024-07-15 ENCOUNTER — Other Ambulatory Visit: Payer: Self-pay | Admitting: Orthopedic Surgery

## 2024-07-18 ENCOUNTER — Ambulatory Visit: Admitting: Physician Assistant

## 2024-07-18 DIAGNOSIS — I83028 Varicose veins of left lower extremity with ulcer other part of lower leg: Secondary | ICD-10-CM

## 2024-07-18 DIAGNOSIS — I739 Peripheral vascular disease, unspecified: Secondary | ICD-10-CM

## 2024-07-18 MED ORDER — TRAMADOL HCL 50 MG PO TABS: 50.0000 mg | ORAL_TABLET | Freq: Four times a day (QID) | ORAL | 0 refills | Status: AC | PRN

## 2024-07-18 NOTE — Progress Notes (Addendum)
 "  Office Visit Note   Patient: Erin [name]           Date of Birth: [birth year]           MRN: [id number] Visit Date: 07/18/2024              Requested by: Janey Santos, MD [address] 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 She states its been present for > 2 years. She was in the [place] study and received Kerecis. She has also tried Vashe wet to dry dressings.  After her last visit she tried Vashe wet to dry, minimal elevation, Vit B12, D3, and vit K,  silver alginate, one day of nitroglycerin  patch.  The wound has staled.     Assessment & Plan: Visit Diagnoses:  1. Venous stasis ulcer of left lower leg (HCC)   2. PAD (peripheral artery disease)     Plan: Staled non healing left medial ankle wound She tried a nitrodur patch for 1 day in the past and is willing to dry it again.  Cont. Vashe wet to dry dressing changes daily and elevation.    Cultures taken on 07/18/24 showed Streptococcus agalactiae Abnormal  This is susceptible to PCN or Beta-lactams.     Follow-Up Instructions: Return in about 4 weeks (around 08/15/2024).   Ortho Exam  Patient is alert, oriented, no adenopathy, well-dressed, normal affect, normal respiratory effort.  100% granulation base measuring 4 cm x 2 cm.  The surface was cleaned with Vashe on a 4 x 4 she tolerated this well.  She states she gets drainage on the guaze with changes.  No surrounding cellulitis or active drainage.   Of note she has calcified [place] vessels.    ABI Findings:  +---------+------------------+-----+-----------+--------+  Right   Rt Pressure (mmHg)IndexWaveform   Comment   +---------+------------------+-----+-----------+--------+  Brachial 245                                         +---------+------------------+-----+-----------+--------+  PTA                             multiphasic          +---------+------------------+-----+-----------+--------+  DP                             multiphasic          +---------+------------------+-----+-----------+--------+  Great Toe170               0.69 Abnormal             +---------+------------------+-----+-----------+--------+   +---------+------------------+-----+--------+-------+  Left    Lt Pressure (mmHg)IndexWaveformComment  +---------+------------------+-----+--------+-------+  Brachial 230                                     +---------+------------------+-----+--------+-------+  PTA                            biphasic         +---------+------------------+-----+--------+-------+  DP  biphasic         +---------+------------------+-----+--------+-------+  [place] Penta               0.56 Abnormal         +---------+------------------+-----+--------+-------+   +-------+-----------+-----------+------------+------------+  ABI/TBIToday's ABIToday's TBIPrevious ABIPrevious TBI  +-------+-----------+-----------+------------+------------+  Right [place]         0.69       [place]          0.67          +-------+-----------+-----------+------------+------------+  Left  [place]         0.56       [place]          0.51          +-------+-----------+-----------+------------+------------+     Summary:  Right: Resting right ankle-brachial index indicates noncompressible right  lower extremity arteries; however multiphasic waveforms are appreciated.  The right toe-brachial index is abnormal.   Left: Resting left ankle-brachial index indicates noncompressible left  lower extremity arteries; however biphasic/multiphasic waveforms are  appreciated. The left toe-brachial index is abnormal.   Imaging:   Labs: No results found for: HGBA1C, ESRSEDRATE, CRP, LABURIC, REPTSTATUS, GRAMSTAIN, CULT,  LABORGA   Lab Results  Component Value Date   ALBUMIN 3.7 08/11/2011   ALBUMIN 4.0 01/19/2006    No results found for: MG No results found for: VD25OH  No results found for: PREALBUMIN    Latest Ref Rng & Units 08/16/2011    4:43 AM 08/15/2011   11:44 AM 08/11/2011    9:45 AM  CBC EXTENDED  WBC 4.0 - 10.5 K/uL 14.4  17.8  12.0   RBC 3.87 - 5.11 MIL/uL 3.16  3.59  4.21   Hemoglobin 12.0 - 15.0 g/dL 9.9  88.8  87.0   HCT 63.9 - 46.0 % 29.0  32.7  39.8   Platelets 150 - 400 K/uL 101  116  118   NEUT# 1.7 - 7.7 K/uL   9.6   Lymph# 0.7 - 4.0 K/uL   1.6      There is no height or weight on file to calculate BMI.  Orders:  Orders Placed This Encounter  Procedures   Anaerobic and Aerobic Culture   Meds ordered this encounter  Medications   traMADol  (ULTRAM ) 50 MG tablet    Sig: Take 1 tablet (50 mg total) by mouth every 6 (six) hours as needed.    Dispense:  30 tablet    Refill:  0    Supervising Provider:   DUDA, MARCUS V [[id number]]     Procedures: No procedures performed  Clinical Data: No additional findings.  ROS:  All other systems negative, except as noted in the HPI. Review of Systems  Objective: Vital Signs: There were no vitals taken for this visit.  Specialty Comments:  No specialty comments available.  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 AVVA    Cancer [facility])    breast ca   Degenerative joint disease    Diverticulosis    GERD (gastroesophageal reflux disease)    Hyperlipidemia    Hypertension    Hypothyroidism    PT TAKES LEVOTHYROXINE    Osteoarthritis  Osteoporosis    Thyroid  disease    hypothyroidism   Ulcer    peptic    History reviewed. No pertinent family  history.  Past Surgical History:  Procedure Laterality Date   ABDOMINAL HYSTERECTOMY  1975   APPENDECTOMY  08/15/2011   Procedure: APPENDECTOMY;  Surgeon: Krystal JINNY Russell, MD;  Location: [facility];  Service: General;  Laterality: N/A;   BREAST SURGERY  1998   mastectomy right breast - PT STATES NO NEEDLES OR B/P'S RIGHT ARM   COLON RESECTION  08/15/2011   Procedure: HAND ASSISTED LAPAROSCOPIC COLON RESECTION;  Surgeon: Krystal JINNY Russell, MD;  Location: [facility];  Service: General;  Laterality: N/A;   CYSTOSCOPY W/ RETROGRADES  08/15/2011   Procedure: CYSTOSCOPY WITH RETROGRADE PYELOGRAM;  Surgeon: Mark C Ottelin, MD;  Location: [facility];  Service: Urology;  Laterality: Left;  ureteral catheter insertion   gastric ulcer repair  1976   JOINT REPLACEMENT  2008   RIGHT TOTAL KNEE ARTHROPLASTY   MASTECTOMY Right 1998   SALPINGOOPHORECTOMY  08/15/2011   Procedure: SALPINGO OOPHERECTOMY;  Surgeon: Krystal JINNY Russell, MD;  Location: [facility];  Service: General;  Laterality: Left;   Social History   Occupational History   Not on file  Tobacco Use   Smoking status: Never   Smokeless tobacco: Never  Substance and Sexual Activity   Alcohol  use: No   Drug use: No   Sexual activity: Not on file       "

## 2024-07-20 ENCOUNTER — Encounter: Payer: Self-pay | Admitting: Physician Assistant

## 2024-07-24 LAB — ANAEROBIC AND AEROBIC CULTURE
MICRO NUMBER:: 17456164
MICRO NUMBER:: 17456165
SPECIMEN QUALITY:: ADEQUATE
SPECIMEN QUALITY:: ADEQUATE

## 2024-08-15 ENCOUNTER — Ambulatory Visit: Admitting: Physician Assistant
